# Patient Record
Sex: Female | Born: 1965 | Race: Black or African American | Hispanic: No | Marital: Married | Smoking: Never smoker
Health system: Southern US, Community
[De-identification: ages and names within clinical notes are randomized; demographics above are authoritative.]

## PROBLEM LIST (undated history)

## (undated) DIAGNOSIS — M5136 Other intervertebral disc degeneration, lumbar region: Secondary | ICD-10-CM

## (undated) DIAGNOSIS — M3500 Sicca syndrome, unspecified: Secondary | ICD-10-CM

## (undated) DIAGNOSIS — T7840XA Allergy, unspecified, initial encounter: Secondary | ICD-10-CM

## (undated) DIAGNOSIS — F32A Depression, unspecified: Secondary | ICD-10-CM

## (undated) DIAGNOSIS — M797 Fibromyalgia: Secondary | ICD-10-CM

## (undated) DIAGNOSIS — M51369 Other intervertebral disc degeneration, lumbar region without mention of lumbar back pain or lower extremity pain: Secondary | ICD-10-CM

## (undated) DIAGNOSIS — B029 Zoster without complications: Secondary | ICD-10-CM

## (undated) DIAGNOSIS — F419 Anxiety disorder, unspecified: Secondary | ICD-10-CM

## (undated) DIAGNOSIS — R7303 Prediabetes: Secondary | ICD-10-CM

## (undated) DIAGNOSIS — M1990 Unspecified osteoarthritis, unspecified site: Secondary | ICD-10-CM

## (undated) DIAGNOSIS — E119 Type 2 diabetes mellitus without complications: Secondary | ICD-10-CM

## (undated) DIAGNOSIS — Z9889 Other specified postprocedural states: Secondary | ICD-10-CM

## (undated) DIAGNOSIS — K219 Gastro-esophageal reflux disease without esophagitis: Secondary | ICD-10-CM

## (undated) DIAGNOSIS — D649 Anemia, unspecified: Secondary | ICD-10-CM

## (undated) DIAGNOSIS — F329 Major depressive disorder, single episode, unspecified: Secondary | ICD-10-CM

## (undated) DIAGNOSIS — R112 Nausea with vomiting, unspecified: Secondary | ICD-10-CM

## (undated) DIAGNOSIS — J189 Pneumonia, unspecified organism: Secondary | ICD-10-CM

## (undated) HISTORY — DX: Pneumonia, unspecified organism: J18.9

## (undated) HISTORY — DX: Allergy, unspecified, initial encounter: T78.40XA

## (undated) HISTORY — DX: Type 2 diabetes mellitus without complications: E11.9

## (undated) HISTORY — DX: Depression, unspecified: F32.A

## (undated) HISTORY — DX: Zoster without complications: B02.9

## (undated) HISTORY — DX: Major depressive disorder, single episode, unspecified: F32.9

## (undated) HISTORY — DX: Unspecified osteoarthritis, unspecified site: M19.90

## (undated) HISTORY — PX: TUBAL LIGATION: SHX77

---

## 1998-04-10 ENCOUNTER — Other Ambulatory Visit: Admission: RE | Admit: 1998-04-10 | Discharge: 1998-04-10 | Payer: Self-pay | Admitting: Obstetrics & Gynecology

## 1999-07-03 ENCOUNTER — Other Ambulatory Visit: Admission: RE | Admit: 1999-07-03 | Discharge: 1999-07-03 | Payer: Self-pay | Admitting: Obstetrics and Gynecology

## 2000-06-16 HISTORY — PX: DIAGNOSTIC LAPAROSCOPY: SUR761

## 2000-07-08 ENCOUNTER — Other Ambulatory Visit: Admission: RE | Admit: 2000-07-08 | Discharge: 2000-07-08 | Payer: Self-pay | Admitting: Obstetrics and Gynecology

## 2000-07-21 ENCOUNTER — Emergency Department (HOSPITAL_COMMUNITY): Admission: EM | Admit: 2000-07-21 | Discharge: 2000-07-21 | Payer: Self-pay | Admitting: Emergency Medicine

## 2000-07-21 ENCOUNTER — Encounter: Payer: Self-pay | Admitting: Emergency Medicine

## 2000-07-27 ENCOUNTER — Encounter: Payer: Self-pay | Admitting: Family Medicine

## 2000-07-27 ENCOUNTER — Ambulatory Visit (HOSPITAL_COMMUNITY): Admission: RE | Admit: 2000-07-27 | Discharge: 2000-07-27 | Payer: Self-pay | Admitting: Family Medicine

## 2000-09-18 ENCOUNTER — Ambulatory Visit (HOSPITAL_COMMUNITY): Admission: RE | Admit: 2000-09-18 | Discharge: 2000-09-18 | Payer: Self-pay | Admitting: Obstetrics and Gynecology

## 2001-08-13 ENCOUNTER — Other Ambulatory Visit: Admission: RE | Admit: 2001-08-13 | Discharge: 2001-08-13 | Payer: Self-pay | Admitting: Obstetrics and Gynecology

## 2002-12-16 ENCOUNTER — Emergency Department (HOSPITAL_COMMUNITY): Admission: EM | Admit: 2002-12-16 | Discharge: 2002-12-16 | Payer: Self-pay | Admitting: Emergency Medicine

## 2002-12-16 ENCOUNTER — Encounter: Payer: Self-pay | Admitting: Emergency Medicine

## 2003-04-17 ENCOUNTER — Other Ambulatory Visit: Admission: RE | Admit: 2003-04-17 | Discharge: 2003-04-17 | Payer: Self-pay | Admitting: Obstetrics and Gynecology

## 2005-06-16 HISTORY — PX: RIGHT OOPHORECTOMY: SHX2359

## 2005-06-26 ENCOUNTER — Encounter: Admission: RE | Admit: 2005-06-26 | Discharge: 2005-06-26 | Payer: Self-pay | Admitting: Family Medicine

## 2005-07-24 ENCOUNTER — Ambulatory Visit (HOSPITAL_COMMUNITY): Admission: RE | Admit: 2005-07-24 | Discharge: 2005-07-24 | Payer: Self-pay | Admitting: Obstetrics and Gynecology

## 2005-07-24 ENCOUNTER — Encounter (INDEPENDENT_AMBULATORY_CARE_PROVIDER_SITE_OTHER): Payer: Self-pay | Admitting: Specialist

## 2006-06-16 HISTORY — PX: ABDOMINAL HYSTERECTOMY: SHX81

## 2006-06-17 ENCOUNTER — Encounter: Admission: RE | Admit: 2006-06-17 | Discharge: 2006-06-17 | Payer: Self-pay | Admitting: Obstetrics and Gynecology

## 2006-07-25 ENCOUNTER — Inpatient Hospital Stay (HOSPITAL_COMMUNITY): Admission: AD | Admit: 2006-07-25 | Discharge: 2006-07-25 | Payer: Self-pay | Admitting: Obstetrics and Gynecology

## 2006-09-21 ENCOUNTER — Ambulatory Visit (HOSPITAL_COMMUNITY): Admission: RE | Admit: 2006-09-21 | Discharge: 2006-09-22 | Payer: Self-pay | Admitting: Obstetrics and Gynecology

## 2006-09-21 ENCOUNTER — Encounter (INDEPENDENT_AMBULATORY_CARE_PROVIDER_SITE_OTHER): Payer: Self-pay | Admitting: *Deleted

## 2007-04-09 ENCOUNTER — Encounter: Admission: RE | Admit: 2007-04-09 | Discharge: 2007-04-09 | Payer: Self-pay | Admitting: Family Medicine

## 2007-04-12 ENCOUNTER — Encounter: Admission: RE | Admit: 2007-04-12 | Discharge: 2007-04-12 | Payer: Self-pay | Admitting: Family Medicine

## 2007-06-28 ENCOUNTER — Encounter: Admission: RE | Admit: 2007-06-28 | Discharge: 2007-06-28 | Payer: Self-pay | Admitting: Obstetrics and Gynecology

## 2008-03-13 ENCOUNTER — Encounter: Admission: RE | Admit: 2008-03-13 | Discharge: 2008-03-13 | Payer: Self-pay | Admitting: Family Medicine

## 2008-06-27 ENCOUNTER — Encounter: Admission: RE | Admit: 2008-06-27 | Discharge: 2008-06-27 | Payer: Self-pay | Admitting: Family Medicine

## 2008-09-01 ENCOUNTER — Encounter: Admission: RE | Admit: 2008-09-01 | Discharge: 2008-09-01 | Payer: Self-pay | Admitting: Family Medicine

## 2008-11-21 ENCOUNTER — Encounter: Admission: RE | Admit: 2008-11-21 | Discharge: 2009-01-11 | Payer: Self-pay | Admitting: Family Medicine

## 2009-11-20 ENCOUNTER — Emergency Department (HOSPITAL_COMMUNITY): Admission: EM | Admit: 2009-11-20 | Discharge: 2009-11-20 | Payer: Self-pay | Admitting: Emergency Medicine

## 2010-03-19 ENCOUNTER — Encounter: Admission: RE | Admit: 2010-03-19 | Discharge: 2010-03-19 | Payer: Self-pay | Admitting: Obstetrics and Gynecology

## 2010-07-07 ENCOUNTER — Encounter: Payer: Self-pay | Admitting: Gastroenterology

## 2010-08-20 ENCOUNTER — Other Ambulatory Visit: Payer: Self-pay | Admitting: Sports Medicine

## 2010-08-20 DIAGNOSIS — M545 Low back pain, unspecified: Secondary | ICD-10-CM

## 2010-08-23 ENCOUNTER — Ambulatory Visit
Admission: RE | Admit: 2010-08-23 | Discharge: 2010-08-23 | Disposition: A | Discharge status: Home / Self Care | Payer: 59 | Source: Ambulatory Visit | Attending: Sports Medicine | Admitting: Sports Medicine

## 2010-08-23 DIAGNOSIS — M545 Low back pain, unspecified: Secondary | ICD-10-CM

## 2010-08-29 ENCOUNTER — Ambulatory Visit: Payer: 59 | Attending: Family Medicine

## 2010-08-29 DIAGNOSIS — M545 Low back pain, unspecified: Secondary | ICD-10-CM | POA: Insufficient documentation

## 2010-08-29 DIAGNOSIS — R5381 Other malaise: Secondary | ICD-10-CM | POA: Insufficient documentation

## 2010-08-29 DIAGNOSIS — M25659 Stiffness of unspecified hip, not elsewhere classified: Secondary | ICD-10-CM | POA: Insufficient documentation

## 2010-08-29 DIAGNOSIS — IMO0001 Reserved for inherently not codable concepts without codable children: Secondary | ICD-10-CM | POA: Insufficient documentation

## 2010-09-03 ENCOUNTER — Ambulatory Visit: Payer: 59

## 2010-09-10 ENCOUNTER — Ambulatory Visit: Payer: 59

## 2010-09-12 ENCOUNTER — Ambulatory Visit: Payer: 59

## 2010-09-17 ENCOUNTER — Ambulatory Visit: Payer: 59 | Attending: Family Medicine

## 2010-09-17 DIAGNOSIS — M545 Low back pain, unspecified: Secondary | ICD-10-CM | POA: Insufficient documentation

## 2010-09-17 DIAGNOSIS — R5381 Other malaise: Secondary | ICD-10-CM | POA: Insufficient documentation

## 2010-09-17 DIAGNOSIS — IMO0001 Reserved for inherently not codable concepts without codable children: Secondary | ICD-10-CM | POA: Insufficient documentation

## 2010-09-17 DIAGNOSIS — M25659 Stiffness of unspecified hip, not elsewhere classified: Secondary | ICD-10-CM | POA: Insufficient documentation

## 2010-09-19 ENCOUNTER — Ambulatory Visit: Payer: 59

## 2010-09-24 ENCOUNTER — Ambulatory Visit: Payer: 59 | Admitting: Physical Therapy

## 2010-09-26 ENCOUNTER — Ambulatory Visit: Payer: 59

## 2010-10-01 ENCOUNTER — Ambulatory Visit: Payer: 59 | Admitting: Physical Therapy

## 2010-11-01 NOTE — Op Note (Signed)
[name], [name]              ACCOUNT NO.:  [id number]   MEDICAL RECORD NO.:  [id number]          PATIENT TYPE:  AMB   LOCATION:  SDC                           FACILITY:  [facility]   PHYSICIAN:  Lenoard Aden, M.D.DATE OF BIRTH:  31-May-1966   DATE OF PROCEDURE:  09/21/2006  DATE OF DISCHARGE:                               OPERATIVE REPORT   PREOPERATIVE DIAGNOSIS:  Refractory dysmenorrhea and menorrhagia status  post endometrial ablation with poor resultant persistent pelvic pain.   POSTOPERATIVE DIAGNOSES:  1. Refractory dysmenorrhea and menorrhagia status post endometrial      ablation with poor resultant persistent pelvic pain.  2. Endometriosis.  3. Enterocele.   PROCEDURES:  1. Diagnostic laparoscopy.  2. Laparoscopically assisted vaginal hysterectomy.  3. Ablation of endometriosis.  4. McCall culdoplasty.   SURGEON:  Lenoard Aden, M.D.   ASSISTANT:  Genia Del, M.D.   ANESTHESIA:  General.   BLOOD LOSS:  200 mL.   COMPLICATIONS:  None.   DRAINS:  None.   COUNTS:  Correct.   COMPLICATIONS:  Uterine perforation.   Patient to recovery in good condition.   DESCRIPTION OF PROCEDURE:  After being apprised of the risks of  anesthesia, infection, bleeding, intra-abdominal injury with need for  repair, delayed versus immediate complications to include bowel and  bladder injury, the patient brought to the operating room where she was  administered general anesthetic without complications, prepped, draped  in sterile fashion.  Feet are placed in the elephant stirrups without  difficulty.  After achieving adequate anesthesia, the Rumi retractor is  placed per vagina in standard fashion.  Balloon is insufflated.  Infraumbilical incision made with a scalpel.  Veress needle placed.  Opening pressure of -2 noted, 4 liters CO2 insufflated without  difficulty and the trocars placed.  Due to previous endometrial  ablation, placement of the Rumi catheter was  difficult and upon entering  the cavity there was noted to be a fundal perforation with the Rumi  catheter through the anterior fundal portion of the uterus, clear  bladder and uterine vessels, no evidence of bleeding noted at this time.  There is endometriosis along the right uterosacral which is ablated and  endometriosis along the left ovary which is ablated.  Two 5 mm trocar  sites are placed suprapubically in the mid clavicular area and mid  axillary line atraumatically under direct visualization and subsequent  to their placement, endometriosis along the right round ligament is  ablated using the Gyrus device and also along the left ovary which  otherwise appears normal.  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  At this time, the  left and right round ligaments are cauterized and divided.  The tubo-  ovarian ligaments also divided on the left.  Bladder flap is developed  sharply.  Rumi cup is visualized. Uterine vessels are skeletonized  bilaterally and desiccated using the Gyrus device and divided.  The  spatula device is then used to circumscribe the Rumi cup along the  anterior portion, dislodging the specimen in  total after identifying the  ureters bilaterally at the commencement of the operation.  At this time,  good hemostasis noted.  Urine is clear.  Specimens removed per vagina  and vaginal cuff was inspected laparoscopically, cauterized and then  closed vaginally after placement of a McCall culdoplasty suture.  The  vagina was closed side-to-side in interrupted fashion for good closure.  Attention is then turned to laparoscopic portion of the procedure  whereby good hemostasis is noted.  There is additional coagulation  needed along the left uterine vein which is done without difficulty.  Urine remained clear, ureters bilaterally pulsatile peristalsing  normally bilaterally.  At this time, the CO2 is released and good   hemostasis is confirmed.  All instruments are removed under direct  visualization.  The patient tolerates procedure well, was awakened and  transferred to recovery in good condition.      Lenoard Aden, M.D.  Electronically Signed     RJT/MEDQ  D:  09/21/2006  T:  09/21/2006  Job:  [id number]

## 2010-11-01 NOTE — H&P (Signed)
NAMETICHINA, KOEBEL              ACCOUNT NO.:  [id number]   MEDICAL RECORD NO.:  [id number]          PATIENT TYPE:  AMB   LOCATION:  SDC                           FACILITY:  [facility]   PHYSICIAN:  Lenoard Aden, M.D.DATE OF BIRTH:  08/09/1965   DATE OF ADMISSION:  DATE OF DISCHARGE:                              HISTORY & PHYSICAL   CHIEF COMPLAINT:  1. Persistent dysmenorrhea and menorrhagia.  2. Status post laparoscopy.  3. Status post endometrial ablation with a history of endometriosis.   She is a 45 year old African-American female, G4, P2, with known  endometriosis and dysmenorrhea, who was recently within the past two  years treated with NovaSure endometrial ablation, with no improvement in  her dysmenorrhea, who presents for definitive therapy.  She is status  post tubal ligation.  She has a history of laparoscopic ablation of  endometriosis.  Previously, she has a history of spontaneous vaginal  delivery x2.   ALLERGIES:  PENICILLIN.   MEDICATIONS:  Dilaudid p.r.n. pain.   PHYSICAL EXAMINATION:  GENERAL:  She is 227 pounds with a blood pressure  of 112/74.  HEENT:  Normal.  LUNGS:  Clear.  HEART:  Regular rhythm.  ABDOMEN:  Soft, nontender.  PELVIC:  Exam reveals a normal-sized uterus and no adnexal masses.  She  is status post previous removal of her left ovary.  EXTREMITIES:  No clubbing.  NEUROLOGIC:  Nonfocal.   IMPRESSION:  Persistent refractory dysmenorrhea, status post ablation,  status post laparoscopy for definitive therapies, proceed with LAVH,  possible USO.  Risks of anesthesia, infection, bleeding, injury of  abdominal organs and need for repair was discussed __________  complications to include bowel and bladder injury, __________, inability  to cure pelvic pain.  Discussed with patient who acknowledges, will  proceed.      Lenoard Aden, M.D.  Electronically Signed     RJT/MEDQ  D:  09/20/2006  T:  09/20/2006  Job:  [id number]

## 2010-11-01 NOTE — Op Note (Signed)
[name], [name]              ACCOUNT NO.:  [id number]   MEDICAL RECORD NO.:  [id number]          PATIENT TYPE:  AMB   LOCATION:  SDC                           FACILITY:  [facility]   PHYSICIAN:  Lenoard Aden, M.D.DATE OF BIRTH:  [birth year]   DATE OF PROCEDURE:  07/24/2005  DATE OF DISCHARGE:                                 OPERATIVE REPORT   PREOPERATIVE DIAGNOSIS:  Right lower quadrant pain, menorrhagia,  dysmenorrhea.   POSTOPERATIVE DIAGNOSIS:  Right lower quadrant pain, menorrhagia,  dysmenorrhea.   PROCEDURE:  Diagnostic hysteroscopy, D&C, NovaSure endometrial ablation,  diagnostic laparoscopy, right salpingo-oophorectomy.   SURGEON:  Lenoard Aden, M.D.   ASSISTANT:  Chester Holstein. Earlene Plater, M.D.   ANESTHESIA:  General.   ESTIMATED BLOOD LOSS:  650 mL.   COMPLICATIONS:  None.   DRAINS:  Foley.   COUNTS:  Correct.   DISPOSITION:  The patient went to recovery room in good condition.   DESCRIPTION OF PROCEDURE:  After being apprised of the risks of anesthesia,  infection, bleeding, injury to abdominal organs with need for repair,  inability to cure pain, delayed versus immediate complications to include  bowel and bladder injury, the patient is brought to the operating room where  she is administered general anesthesia without complications.  Prepped and  draped in the usual sterile fashion.  Foley catheter placed.  After  achieving adequate anesthesia, dilute Pitressin solution placed at 3 and 9  o'clock cervicovaginal junction.  Measurements are taken for the NovaSure of  3 and 0.6 and 10 cm respectively.  Intracervical and uterine length.  At  this time, hysteroscope is placed.  No focal lesions are noted.  D&C is  performed.  Copious endometrial curettings are obtained.  At this time the  NovaSure device is placed in the standard fashion and CO2 test is negative.  The ablation is performed without difficulty.  Revisualization reveals  evidence of a successful  ablation and good coverage of the endometrial  cavity.  Hulka tenaculum was then placed per vagina and infraumbilical  incision was made with the scalpel.  The fascia was identified and opened.  Pursestring suture placed.  Peritoneum entered and sharply Hasson trocar  placed.  CO2 entered without difficulty, 3 liters.  Laparoscope placed.  Visualization reveals atraumatic trocar entry.  Normal liver and gallbladder  bed, normal appendiceal area.  The areas of right tube and ovary with two  small paratubal cysts, some focal endometriosis.  The infundibulopelvic  ligament is visualized after placement of two 5 mm trocars suprapubically in  the right and mid lower quadrants under transillumination.  Tripolar is  entered.  Infundibulopelvic ligament is identified, cauterized after  identifying the ureter on the right side.  Progressive bites down the  mesosalpingeal segment to the level of the tubo-ovarian ligament are taken,  removed, and excised using the gyrus tripolar device.  5 mm scope is placed.  Specimen is placed in the EndoCatch and removed through the umbilical port.  At this time, good hemostasis is obtained using the Kleppinger and the  tripolar device.  All instruments are removed under  direct visualization and  CO2 released.  Incisions closed using 0 Vicryl by tying the pursestring and  closing the subcutaneous tissue.  4-0 Vicryl placed, Dermabond placed.  Instruments removed from the vagina and Foley removed.  The patient  tolerated the procedure well and is transferred to the recovery room in good  condition.      Lenoard Aden, M.D.  Electronically Signed     RJT/MEDQ  D:  07/24/2005  T:  07/24/2005  Job:  [id number]

## 2010-11-01 NOTE — H&P (Signed)
[facility]  Patient:    [name], [name]                     MRN: [id number] Adm. Date:  [id number] Attending:  Lenoard Aden CC:         [facility]   History and Physical  CHIEF COMPLAINT:              Subacute right lower quadrant pain.  HISTORY OF PRESENT ILLNESS:   The patient is a 45 year old black female, gravida 4, para 2, status post tubal ligation two years ago, who presents with a persistent intermittent right lower quadrant pain.  Negative for GI or GU symptoms.  Pain is exacerbated by intercourse and is not worse with her period.  She has had no other gastrointestinal or genitourinary-type symptoms, and therefore, has had a negative workup to this point.  She has had a normal ultrasound.  She _______ for an ultrasound for diagnostic and hopefully therapeutic measures.  PAST MEDICAL HISTORY:         Remarkable for a family history of heart problems, diabetes, and hypercholesterolemia.  ALLERGIES:                    No known drug allergies.  PAST SURGICAL HISTORY:        Remarkable for a laparoscopic tubal ligation performed by Dr. Arlyce Dice without problems in 1999.  REVIEW OF SYSTEMS:            Gyn review of systems otherwise negative.  SOCIAL HISTORY:               Negative.  PHYSICAL EXAMINATION:  GENERAL:                      She is a well-developed and well-nourished black female in no apparent distress.  HEENT:                        Normal.  LUNGS:                        Clear.  HEART:                        Regular rhythm.  ABDOMEN:                      Soft, obese, and nontender.  PELVIC EXAMINATION:           Reveals a small uterus which is anteflexed and a small anterior uterine fibroid noted.  No adnexal masses are appreciated.  IMPRESSION:                   Persistent right lower quadrant pain of                               questionable etiology.  PLAN:                         To proceed with  diagnostic laparoscopy and possible RSO.  Risks of anesthesia, infection, bleeding, injury to the abdominal organs, and need for repair was discussed.  The possibility of inability to cure pain is noted and discussed in detail with the patient today.  Delayed versus immediate complications to include bowel and  bladder injury were discussed.  The patient acknowledges and desires to proceed. DD:  09/18/00 TD:  09/18/00 Job: [id number]

## 2010-11-01 NOTE — Op Note (Signed)
[facility]  Patient:    [name], [name]                     MRN: [id number] Proc. Date: 09/18/00 Adm. Date:  [id number] Attending:  Lenoard Aden CC:         [facility]   Operative Report  PREOPERATIVE DIAGNOSIS:       Intermittent right lower quadrant pain.  POSTOPERATIVE DIAGNOSIS:      Cul-de-sac adhesions.                               Right paraovarian adhesions.                               Right pericolonic adhesions.                               Endometriotic implant on right ovary.  OPERATION:                    Diagnostic laparoscopy.                               Extensive lysis of adhesions.                               Ablation of right ovarian endometriosis.  SURGEON:                      Lenoard Aden, M.D.  ANESTHESIA:                   General.  ESTIMATED BLOOD LOSS:         Less than 50 cc.  COMPLICATIONS:                None.  DRAINS/TUBES:                 None.  COUNTS:                       Correct.  DISPOSITION:                  Patient to recovery room in good condition.  DESCRIPTION OF PROCEDURE:     After being apprised of the risks of anesthesia, infection, bleeding, injury to abdominal organs and need for repair, inability to cure pain, consent was obtained and the patient was brought to the operating room where she was administered a general anesthetic without complications, placed in the dorsal lithotomy position and prepped and draped in the usual sterile fashion and catheterized until her bladder was emptied. Examination under anesthesia reveals a small anteflexed uterus and no adnexal masses.  A Hulka tenaculum was placed per vagina and an infraumbilical incision then made after placing a dilute Marcaine solution. The Veress needle was placed and opening pressures was 0 to -2 noted. The patients pressure was set to 25 and 4.5 L of CO2 was insufflated without difficulty. A trocar  was placed atraumatically and pictures taken with a normal liver and gallbladder bed noted. Extensive right pericecal and ascending colonic adhesions are noted. Normal sized uterus with small anterior fibroid, surgically divided  tubes, normal left ovary and some right ovarian adhesions and mid cul-de-sac adhesions are noted upon inspection. A mid suprapubic trocar site is made atraumatically with the scalpel and a 5 mm trocar placed. Sharp scissors and short grasper were used to stretch the pericolonic adhesions away from the anterior abdominal wall moving the bowel out of the operative field and these adhesions are dissected sharply using monopolar cautery until they are completely resected to the lateral abdominal sidewall. The periovarian adhesions are cauterized and divided and the hemorrhagic area that is on the caudad portion of the right ovary, questionably endometriotic versus hemorrhagic, is cauterized using bipolar cautery. The cul-de-sac adhesions are lysed using sharp dissection. Good hemostasis achieved. All areas of the surgical surfaces are inspected. A normal right ovary is apparent. The instruments were then removed under direct visualization. CO2 was released. The incisions were closed using #0 Vicryl and Dermabond. The instruments were then removed from the vagina. The patient tolerated the procedure well and was transferred to recovery room in good condition.  ADDENDUM:                     After establishing trocar entry, the patient pressure was reset to 15 mmHg and pneumoperitoneum was maintained. Also, before placement of the Veress needle, a hanging drop test was performed and negative pressure was confirmed. DD:  09/18/00 TD:  09/18/00 Job: [id number]

## 2011-04-09 ENCOUNTER — Other Ambulatory Visit: Payer: Self-pay | Admitting: Obstetrics and Gynecology

## 2011-04-09 DIAGNOSIS — Z1231 Encounter for screening mammogram for malignant neoplasm of breast: Secondary | ICD-10-CM

## 2011-04-28 ENCOUNTER — Other Ambulatory Visit: Payer: Self-pay | Admitting: Family Medicine

## 2011-04-28 DIAGNOSIS — E079 Disorder of thyroid, unspecified: Secondary | ICD-10-CM

## 2011-04-30 ENCOUNTER — Other Ambulatory Visit: Payer: 59

## 2011-05-01 ENCOUNTER — Ambulatory Visit
Admission: RE | Admit: 2011-05-01 | Discharge: 2011-05-01 | Disposition: A | Discharge status: Home / Self Care | Payer: 59 | Source: Ambulatory Visit | Attending: Family Medicine | Admitting: Family Medicine

## 2011-05-01 DIAGNOSIS — E079 Disorder of thyroid, unspecified: Secondary | ICD-10-CM

## 2011-05-12 ENCOUNTER — Ambulatory Visit: Payer: 59

## 2011-05-22 ENCOUNTER — Ambulatory Visit: Payer: 59

## 2011-06-06 ENCOUNTER — Ambulatory Visit: Payer: 59

## 2011-07-17 ENCOUNTER — Other Ambulatory Visit (HOSPITAL_COMMUNITY)
Admission: RE | Admit: 2011-07-17 | Discharge: 2011-07-17 | Disposition: A | Discharge status: Home / Self Care | Payer: 59 | Source: Ambulatory Visit | Attending: Obstetrics and Gynecology | Admitting: Obstetrics and Gynecology

## 2011-07-17 ENCOUNTER — Other Ambulatory Visit: Payer: Self-pay | Admitting: Obstetrics and Gynecology

## 2011-07-17 DIAGNOSIS — Z01419 Encounter for gynecological examination (general) (routine) without abnormal findings: Secondary | ICD-10-CM | POA: Insufficient documentation

## 2012-02-27 ENCOUNTER — Encounter (HOSPITAL_COMMUNITY): Payer: Self-pay | Admitting: Emergency Medicine

## 2012-02-27 ENCOUNTER — Emergency Department (HOSPITAL_COMMUNITY): Payer: 59

## 2012-02-27 ENCOUNTER — Emergency Department (HOSPITAL_COMMUNITY)
Admission: EM | Admit: 2012-02-27 | Discharge: 2012-02-28 | Disposition: A | Discharge status: Home / Self Care | Payer: 59 | Attending: Emergency Medicine | Admitting: Emergency Medicine

## 2012-02-27 DIAGNOSIS — S63509A Unspecified sprain of unspecified wrist, initial encounter: Secondary | ICD-10-CM | POA: Insufficient documentation

## 2012-02-27 DIAGNOSIS — S134XXA Sprain of ligaments of cervical spine, initial encounter: Secondary | ICD-10-CM

## 2012-02-27 DIAGNOSIS — S139XXA Sprain of joints and ligaments of unspecified parts of neck, initial encounter: Secondary | ICD-10-CM | POA: Insufficient documentation

## 2012-02-27 DIAGNOSIS — M545 Low back pain, unspecified: Secondary | ICD-10-CM | POA: Insufficient documentation

## 2012-02-27 DIAGNOSIS — T148XXA Other injury of unspecified body region, initial encounter: Secondary | ICD-10-CM

## 2012-02-27 DIAGNOSIS — S63502A Unspecified sprain of left wrist, initial encounter: Secondary | ICD-10-CM

## 2012-02-27 DIAGNOSIS — M542 Cervicalgia: Secondary | ICD-10-CM | POA: Insufficient documentation

## 2012-02-27 DIAGNOSIS — M25559 Pain in unspecified hip: Secondary | ICD-10-CM | POA: Insufficient documentation

## 2012-02-27 DIAGNOSIS — IMO0002 Reserved for concepts with insufficient information to code with codable children: Secondary | ICD-10-CM | POA: Insufficient documentation

## 2012-02-27 DIAGNOSIS — R51 Headache: Secondary | ICD-10-CM | POA: Insufficient documentation

## 2012-02-27 MED ORDER — HYDROCODONE-ACETAMINOPHEN 5-325 MG PO TABS
2.0000 | ORAL_TABLET | Freq: Once | ORAL | Status: AC
  Administered 2012-02-27: 2 via ORAL
  Filled 2012-02-27: qty 2

## 2012-02-27 MED ORDER — HYDROCODONE-ACETAMINOPHEN 5-500 MG PO TABS: 1.0000 | ORAL_TABLET | Freq: Four times a day (QID) | ORAL | Status: AC | PRN

## 2012-02-27 MED ORDER — CYCLOBENZAPRINE HCL 10 MG PO TABS: 10.0000 mg | ORAL_TABLET | Freq: Two times a day (BID) | ORAL | Status: AC | PRN

## 2012-02-27 NOTE — ED Notes (Signed)
[employer] at bedside 
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
## 2012-03-03 ENCOUNTER — Ambulatory Visit
Admission: RE | Admit: 2012-03-03 | Discharge: 2012-03-03 | Disposition: A | Discharge status: Home / Self Care | Payer: 59 | Source: Ambulatory Visit | Attending: Family Medicine | Admitting: Family Medicine

## 2012-03-03 ENCOUNTER — Other Ambulatory Visit: Payer: Self-pay | Admitting: Family Medicine

## 2012-03-03 DIAGNOSIS — K047 Periapical abscess without sinus: Secondary | ICD-10-CM

## 2012-03-03 MED ORDER — GADOBENATE DIMEGLUMINE 529 MG/ML IV SOLN
20.0000 mL | Freq: Once | INTRAVENOUS | Status: AC | PRN
  Administered 2012-03-03: 20 mL via INTRAVENOUS

## 2012-03-04 ENCOUNTER — Other Ambulatory Visit: Payer: 59

## 2012-03-07 ENCOUNTER — Other Ambulatory Visit: Payer: 59

## 2012-07-24 ENCOUNTER — Emergency Department (HOSPITAL_COMMUNITY)
Admission: EM | Admit: 2012-07-24 | Discharge: 2012-07-24 | Disposition: A | Discharge status: Home / Self Care | Payer: Self-pay | Attending: Emergency Medicine | Admitting: Emergency Medicine

## 2012-07-24 ENCOUNTER — Emergency Department (HOSPITAL_COMMUNITY): Payer: Self-pay

## 2012-07-24 ENCOUNTER — Encounter (HOSPITAL_COMMUNITY): Payer: Self-pay | Admitting: Emergency Medicine

## 2012-07-24 DIAGNOSIS — IMO0002 Reserved for concepts with insufficient information to code with codable children: Secondary | ICD-10-CM | POA: Insufficient documentation

## 2012-07-24 DIAGNOSIS — Y929 Unspecified place or not applicable: Secondary | ICD-10-CM | POA: Insufficient documentation

## 2012-07-24 DIAGNOSIS — S62609A Fracture of unspecified phalanx of unspecified finger, initial encounter for closed fracture: Secondary | ICD-10-CM

## 2012-07-24 DIAGNOSIS — Y9389 Activity, other specified: Secondary | ICD-10-CM | POA: Insufficient documentation

## 2012-07-24 DIAGNOSIS — R296 Repeated falls: Secondary | ICD-10-CM | POA: Insufficient documentation

## 2012-07-24 DIAGNOSIS — Z8739 Personal history of other diseases of the musculoskeletal system and connective tissue: Secondary | ICD-10-CM | POA: Insufficient documentation

## 2012-07-24 DIAGNOSIS — Z79899 Other long term (current) drug therapy: Secondary | ICD-10-CM | POA: Insufficient documentation

## 2012-07-24 HISTORY — DX: Other intervertebral disc degeneration, lumbar region without mention of lumbar back pain or lower extremity pain: M51.369

## 2012-07-24 HISTORY — DX: Other intervertebral disc degeneration, lumbar region: M51.36

## 2012-07-24 MED ORDER — IBUPROFEN 600 MG PO TABS: 600.0000 mg | ORAL_TABLET | Freq: Four times a day (QID) | ORAL | Status: DC | PRN

## 2012-07-24 MED ORDER — HYDROCODONE-ACETAMINOPHEN 5-325 MG PO TABS: ORAL_TABLET | ORAL | Status: DC

## 2012-07-24 NOTE — ED Notes (Signed)
Pt c/o right 5th finger pain and swelling after falling.

## 2012-07-24 NOTE — ED Provider Notes (Signed)
History     CSN: [id number]  Arrival date & time 07/24/12  0801   First MD Initiated Contact with Patient 07/24/12 [phone]      Chief Complaint  Patient presents with  . Finger Injury    (Consider location/radiation/quality/duration/timing/severity/associated sxs/prior treatment) HPI Comments: Patient presents with complaint of right hand pain which started acutely 4 days ago when she fell. Patient states that she fell onto an outstretched hand and her fifth finger hyperextended. Patient states that she noted pain and swelling initially which has not improved. She buddy taped her fingers at home. She's been taking Tylenol and ibuprofen for pain. She denies wrist, elbow, or shoulder pain. She did not hit her head. Course is constant. Movement or palpation makes the pain worse. Nothing makes it better.  The history is provided by the patient.    Past Medical History  Diagnosis Date  . DDD (degenerative disc disease), lumbar     Past Surgical History  Procedure Laterality Date  . Abdominal hysterectomy    . Tubal ligation    . Oophorectomy      History reviewed. No pertinent family history.  History  Substance Use Topics  . Smoking status: Never Smoker   . Smokeless tobacco: Not on file  . Alcohol Use: Yes     Comment: social    OB History   Grav Para Term Preterm Abortions TAB SAB Ect Mult Living                  Review of Systems  Constitutional: Negative for activity change.  HENT: Negative for neck pain.   Musculoskeletal: Positive for joint swelling and arthralgias. Negative for back pain.  Skin: Negative for wound.  Neurological: Negative for weakness and numbness.    Allergies  Penicillins  Home Medications   Current Outpatient Rx  Name  Route  Sig  Dispense  Refill  . FLUoxetine (PROZAC) 20 MG tablet   Oral   Take 20 mg by mouth daily.         . fluticasone (FLONASE) 50 MCG/ACT nasal spray   Nasal   Place 2 sprays into the nose daily.         [name] ibuprofen (ADVIL,MOTRIN) 800 MG tablet   Oral   Take 800 mg by mouth every 8 (eight) hours as needed.           BP 132/96  Pulse 77  Temp(Src) 97.7 F (36.5 C) (Oral)  Resp 16  Ht 5' 5.5" (1.664 m)  Wt 210 lb (95.255 kg)  BMI 34.4 kg/m2  SpO2 96%  Physical Exam  Nursing note and vitals reviewed. Constitutional: She appears well-developed and well-nourished.  HENT:  Head: Normocephalic and atraumatic.  Eyes: Pupils are equal, round, and reactive to light.  Neck: Normal range of motion. Neck supple.  Cardiovascular: Exam reveals no decreased pulses.   Musculoskeletal: She exhibits tenderness. She exhibits no edema.       Right shoulder: Normal.       Right elbow: Normal.      Right wrist: Normal.       Cervical back: Normal.       Right hand: She exhibits decreased range of motion, tenderness, bony tenderness and swelling. She exhibits normal capillary refill. Normal sensation noted. Normal strength noted.       Hands: Neurological: She is alert. No sensory deficit.  Motor, sensation, and vascular distal to the injury is fully intact.   Skin: Skin is warm and dry.  Psychiatric: She has a normal mood and affect.    ED Course  Procedures (including critical care time)  Labs Reviewed - No data to display Dg Hand Complete Right  07/24/2012  *RADIOLOGY REPORT*  Clinical Data: 47 year old female with pain lateral hand radiating to wrist.  Status post injury.  RIGHT HAND - COMPLETE 3+ VIEW  Comparison: None.  Findings: Transverse fracture through the proximal metadiaphysis of the right fifth proximal phalanx.  Minimal dorsal and ulnar angulation.  Fifth metacarpal intact.  Distal radius and ulna intact.  Carpal bone alignment within normal limits.  No other acute fracture.  IMPRESSION: Transverse extraarticular fracture through the base of the right fifth proximal phalanx with minor angulation.   Original Report Authenticated By: Erskine Speed, M.D.      1. Finger fracture,  right     8:38 AM Patient seen and examined. Work-up initiated.    Vital signs reviewed and are as follows: Filed Vitals:   07/24/12 0815  BP: 132/96  Pulse: 77  Temp: 97.7 F (36.5 C)  Resp: 16   X-ray reviewed by myself. Patient informed. Finger splint placed by nurse.  Orthopedic referral given.  Patient counseled on use of narcotic pain medications. Counseled not to combine these medications with others containing tylenol. Urged not to drink alcohol, drive, or perform any other activities that requires focus while taking these medications. The patient verbalizes understanding and agrees with the plan.     MDM  Finger fracture, immobilized in the ED. Fracture does not involve the joint. Finger is neurovascularly intact. Orthopedic followup given.       [place], [place] 07/24/12 [phone]

## 2012-07-24 NOTE — ED Notes (Signed)
PA at bedside.

## 2012-07-24 NOTE — ED Provider Notes (Signed)
Medical screening examination/treatment/procedure(s) were performed by non-physician practitioner and as supervising physician I was immediately available for consultation/collaboration.  Doug Sou, MD 07/24/12 1536

## 2013-05-25 ENCOUNTER — Other Ambulatory Visit (HOSPITAL_COMMUNITY)
Admission: RE | Admit: 2013-05-25 | Discharge: 2013-05-25 | Disposition: A | Discharge status: Home / Self Care | Payer: 59 | Source: Ambulatory Visit | Attending: Obstetrics and Gynecology | Admitting: Obstetrics and Gynecology

## 2013-05-25 ENCOUNTER — Other Ambulatory Visit: Payer: Self-pay | Admitting: Obstetrics and Gynecology

## 2013-05-25 DIAGNOSIS — Z01419 Encounter for gynecological examination (general) (routine) without abnormal findings: Secondary | ICD-10-CM | POA: Insufficient documentation

## 2013-05-25 DIAGNOSIS — Z1151 Encounter for screening for human papillomavirus (HPV): Secondary | ICD-10-CM | POA: Insufficient documentation

## 2014-07-18 ENCOUNTER — Other Ambulatory Visit: Payer: Self-pay

## 2014-07-18 DIAGNOSIS — Z1231 Encounter for screening mammogram for malignant neoplasm of breast: Secondary | ICD-10-CM

## 2014-08-03 ENCOUNTER — Encounter: Payer: Self-pay | Admitting: Internal Medicine

## 2014-08-03 ENCOUNTER — Ambulatory Visit (INDEPENDENT_AMBULATORY_CARE_PROVIDER_SITE_OTHER): Payer: 59 | Admitting: Internal Medicine

## 2014-08-03 VITALS — BP 120/80 | HR 78 | Temp 98.2°F | Ht 65.5 in | Wt 258.0 lb

## 2014-08-03 DIAGNOSIS — F419 Anxiety disorder, unspecified: Secondary | ICD-10-CM | POA: Insufficient documentation

## 2014-08-03 DIAGNOSIS — Z13220 Encounter for screening for lipoid disorders: Secondary | ICD-10-CM

## 2014-08-03 DIAGNOSIS — R5383 Other fatigue: Secondary | ICD-10-CM

## 2014-08-03 DIAGNOSIS — M62830 Muscle spasm of back: Secondary | ICD-10-CM

## 2014-08-03 DIAGNOSIS — F329 Major depressive disorder, single episode, unspecified: Secondary | ICD-10-CM

## 2014-08-03 DIAGNOSIS — F32A Depression, unspecified: Secondary | ICD-10-CM | POA: Insufficient documentation

## 2014-08-03 DIAGNOSIS — M5136 Other intervertebral disc degeneration, lumbar region: Secondary | ICD-10-CM | POA: Insufficient documentation

## 2014-08-03 MED ORDER — TRAMADOL HCL 50 MG PO TABS: 50.0000 mg | ORAL_TABLET | Freq: Four times a day (QID) | ORAL | Status: DC | PRN

## 2014-08-03 MED ORDER — METHOCARBAMOL 500 MG PO TABS: 500.0000 mg | ORAL_TABLET | Freq: Three times a day (TID) | ORAL | Status: DC | PRN

## 2014-08-03 NOTE — Patient Instructions (Signed)

## 2014-08-03 NOTE — Assessment & Plan Note (Signed)
Moderate but stable I will fill her Tramadol and Norco but not monthly Tramadol refilled today Xray and MRI reviewed

## 2014-08-03 NOTE — Progress Notes (Signed)
HPI  Pt presents to the clinic today to establish care and for management of the conditions listed below. She is transferring care from [facility] and [place].  Flu: 2014 Tetanus: < 10 years ago Pap Smear: She thinks in 2013. Had hysterectomy. Mammogram: 2013, scheduled for 2/232016 Vision Screening: Scheduled 08/2014 Dentist: an needed  She c/o back spasms in the left shoulder area. She noticed this 2 months ago but it is intermittent. When it occurs, it feels like a sharp pain, then achy afterwards. She has tried Ibuprofen with some relief. She denies any injury to the area .   Depression and Anxiety: She takes Prozac daily, which seems to work well for her. She also takes Xanax daily at night to help her sleep when she is very stressed and has trouble sleeping. She reports she has been more stressed lately over bills.  DDD of Lumbar back: Moderate. She has had xrays and MRI of lumbar spine. Results have been reviewed in Epic. She reports she takes advil, ultram and norco depending on the severity of the pain but does not need to take medication on a daily basis. She has also been on Robaxin in the past. She was seeing pain management but reports her insurance would not cover those visits.   Past Medical History  Diagnosis Date  . DDD (degenerative disc disease), lumbar   . Depression   . Allergy      Current Outpatient Prescriptions  Medication Sig Dispense Refill  . doxycycline (VIBRA-TABS) 100 MG tablet     . FLUoxetine (PROZAC) 20 MG tablet Take 20 mg by mouth daily before breakfast.     . HYDROcodone-acetaminophen (NORCO/VICODIN) 5-325 MG per tablet Take 1-2 tablets every 6 hours as needed for severe pain 12 tablet 0  . ibuprofen (ADVIL,MOTRIN) 800 MG tablet     . predniSONE (DELTASONE) 10 MG tablet     . PROAIR HFA 108 (90 BASE) MCG/ACT inhaler     . traMADol (ULTRAM) 50 MG tablet      No current facility-administered medications for this visit.    Allergies   Allergen Reactions  . Penicillins Other (See Comments)    Childhood allergy    No family history on file.  History   Social History  . Marital Status: Married    Spouse Name: N/A  . Number of Children: N/A  . Years of Education: N/A   Occupational History  . Not on file.   Social History Main Topics  . Smoking status: Never Smoker   . Smokeless tobacco: Not on file  . Alcohol Use: Yes     Comment: social  . Drug Use: No  . Sexual Activity: Not on file   Other Topics Concern  . Not on file   Social History Narrative    ROS:  Constitutional: Pt reports fatigue.  Denies fever, malaise, headache or abrupt weight changes.  Respiratory: Denies difficulty breathing, shortness of breath, cough or sputum production.   Cardiovascular: Denies chest pain, chest tightness, palpitations or swelling in the hands or feet.  Musculoskeletal: Pt reports back pain. Denies decrease in range of motion, difficulty with gait, muscle pain or joint swelling.  Skin: Denies redness, rashes, lesions or ulcercations.  Neurological: Denies dizziness, difficulty with memory, difficulty with speech or problems with balance and coordination.  Psych: Pt reports depression. Denies anxiety, SI/HI.  No other specific complaints in a complete review of systems (except as listed in HPI above).  PE:  Ht 5' 5.5" (  1.664 m)  Wt 258 lb (117.028 kg)  BMI 42.27 kg/m2 Wt Readings from Last 3 Encounters:  08/03/14 258 lb (117.028 kg)  07/24/12 210 lb (95.255 kg)    General: Appears her stated age, well developed, well nourished in NAD. Skin: Warm, dry and intact. Cardiovascular: Normal rate and rhythm. S1,S2 noted.  No murmur, rubs or gallops noted.  Pulmonary/Chest: Normal effort and positive vesicular breath sounds. No respiratory distress. No wheezes, rales or ronchi noted.  Musculoskeletal: Decreased flexion and extension of the spine. Pine with palpation of the lumbar spine. Strength 5/5 BLE. No  difficulty with gait. Neurological: Alert and oriented.  Coordination normal. +DTRs bilaterally. Psychiatric: Mood normal but affect flat. Behavior is normal. Judgment and thought content normal.     Assessment and Plan:  Screening for HLD:  Will check lipid profile today  Fatigue:  Will check TSH and Vit D  Muscle Spasms of left upper back:  eRx for Robaxin Stretching exercises given A heating pad may be helpful  RTC in 6 months or sooner if needed

## 2014-08-03 NOTE — Assessment & Plan Note (Signed)
Support offered today Continue Prozac at this time Check CBC and CMET today

## 2014-08-03 NOTE — Progress Notes (Signed)
Pre visit review using our clinic review tool, if applicable. No additional management support is needed unless otherwise documented below in the visit note. 

## 2014-08-04 ENCOUNTER — Other Ambulatory Visit: Payer: Self-pay

## 2014-08-04 LAB — CBC WITH DIFFERENTIAL/PLATELET
Basophils Absolute: 0 10*3/uL (ref 0.0–0.2)
Basos: 1 %
Eos: 1 %
Eosinophils Absolute: 0.1 10*3/uL (ref 0.0–0.4)
HCT: 37.8 % (ref 34.0–46.6)
Hemoglobin: 13 g/dL (ref 11.1–15.9)
Immature Grans (Abs): 0 10*3/uL (ref 0.0–0.1)
Immature Granulocytes: 0 %
Lymphocytes Absolute: 2.8 10*3/uL (ref 0.7–3.1)
Lymphs: 48 %
MCH: 32.3 pg (ref 26.6–33.0)
MCHC: 34.4 g/dL (ref 31.5–35.7)
MCV: 94 fL (ref 79–97)
Monocytes Absolute: 0.5 10*3/uL (ref 0.1–0.9)
Monocytes: 9 %
Neutrophils Absolute: 2.4 10*3/uL (ref 1.4–7.0)
Neutrophils Relative %: 41 %
Platelets: 238 10*3/uL (ref 150–379)
RBC: 4.02 x10E6/uL (ref 3.77–5.28)
RDW: 13.7 % (ref 12.3–15.4)
WBC: 5.8 10*3/uL (ref 3.4–10.8)

## 2014-08-04 LAB — COMPREHENSIVE METABOLIC PANEL
ALT: 12 IU/L (ref 0–32)
AST: 12 IU/L (ref 0–40)
Albumin/Globulin Ratio: 1.3 (ref 1.1–2.5)
Albumin: 4.3 g/dL (ref 3.5–5.5)
Alkaline Phosphatase: 64 IU/L (ref 39–117)
BUN/Creatinine Ratio: 14 (ref 9–23)
BUN: 10 mg/dL (ref 6–24)
Bilirubin Total: 0.3 mg/dL (ref 0.0–1.2)
CO2: 22 mmol/L (ref 18–29)
Calcium: 9.6 mg/dL (ref 8.7–10.2)
Chloride: 104 mmol/L (ref 97–108)
Creatinine, Ser: 0.71 mg/dL (ref 0.57–1.00)
GFR calc Af Amer: 116 mL/min/{1.73_m2} (ref >59)
GFR calc non Af Amer: 101 mL/min/{1.73_m2} (ref >59)
Globulin, Total: 3.3 g/dL (ref 1.5–4.5)
Glucose: 96 mg/dL (ref 65–99)
Potassium: 4.1 mmol/L (ref 3.5–5.2)
Sodium: 141 mmol/L (ref 134–144)
Total Protein: 7.6 g/dL (ref 6.0–8.5)

## 2014-08-04 LAB — VITAMIN D 25 HYDROXY (VIT D DEFICIENCY, FRACTURES): Vit D, 25-Hydroxy: 17 ng/mL — ABNORMAL LOW (ref 30.0–100.0)

## 2014-08-04 LAB — LIPID PANEL
Chol/HDL Ratio: 3.6 ratio units (ref 0.0–4.4)
Cholesterol, Total: 204 mg/dL — ABNORMAL HIGH (ref 100–199)
HDL: 56 mg/dL (ref >39)
LDL Calculated: 128 mg/dL — ABNORMAL HIGH (ref 0–99)
Triglycerides: 102 mg/dL (ref 0–149)
VLDL Cholesterol Cal: 20 mg/dL (ref 5–40)

## 2014-08-04 LAB — TSH: TSH: 2.3 u[IU]/mL (ref 0.450–4.500)

## 2014-08-04 MED ORDER — METHOCARBAMOL 500 MG PO TABS: 500.0000 mg | ORAL_TABLET | Freq: Three times a day (TID) | ORAL | Status: DC | PRN

## 2014-08-04 MED ORDER — VITAMIN D (ERGOCALCIFEROL) 1.25 MG (50000 UNIT) PO CAPS: 50000.0000 [IU] | ORAL_CAPSULE | ORAL | Status: DC

## 2014-08-04 NOTE — Addendum Note (Signed)
Addended by: Lurlean Nanny on: 08/04/2014 04:31 PM   Modules accepted: Orders

## 2014-08-04 NOTE — Telephone Encounter (Signed)
Sent to [place] originally by accident--resent to [facility] [address]

## 2014-08-08 ENCOUNTER — Ambulatory Visit
Admission: RE | Admit: 2014-08-08 | Discharge: 2014-08-08 | Disposition: A | Discharge status: Home / Self Care | Payer: 59 | Source: Ambulatory Visit

## 2014-08-08 DIAGNOSIS — Z1231 Encounter for screening mammogram for malignant neoplasm of breast: Secondary | ICD-10-CM

## 2014-08-09 ENCOUNTER — Other Ambulatory Visit: Payer: Self-pay | Admitting: Internal Medicine

## 2014-08-09 DIAGNOSIS — R928 Other abnormal and inconclusive findings on diagnostic imaging of breast: Secondary | ICD-10-CM

## 2014-08-14 ENCOUNTER — Telehealth: Payer: Self-pay | Admitting: *Deleted

## 2014-08-14 ENCOUNTER — Other Ambulatory Visit: Payer: Self-pay

## 2014-08-14 MED ORDER — HYDROCODONE-ACETAMINOPHEN 5-325 MG PO TABS: 1.0000 | ORAL_TABLET | Freq: Four times a day (QID) | ORAL | Status: DC | PRN

## 2014-08-14 NOTE — Telephone Encounter (Signed)
Patient left a voicemail stating that she is waiting to hear back regarding her lab results and wants to know when she can get the prescription for her Hydrocodone?

## 2014-08-14 NOTE — Telephone Encounter (Signed)
Mel- can you address her lab results, you may have sent her a letter. Advise her I will refill her hydrocodone but this is for severe pain only and will not be given on a monthly basis- print out RX for me to sign

## 2014-08-14 NOTE — Telephone Encounter (Signed)
Spoke with pt and she states she did not have any questions or concerns regarding her labs, but she did want to see if Rx for Norco could be filled as [facility] said she would wait until after lab results come back--per verbal order from [facility] printed and placed in the front office for pick up and pt is aware--controlled substance contract needed

## 2014-08-16 ENCOUNTER — Encounter: Payer: Self-pay | Admitting: *Deleted

## 2014-08-17 ENCOUNTER — Other Ambulatory Visit: Payer: 59

## 2014-08-21 ENCOUNTER — Ambulatory Visit
Admission: RE | Admit: 2014-08-21 | Discharge: 2014-08-21 | Disposition: A | Discharge status: Home / Self Care | Payer: 59 | Source: Ambulatory Visit | Attending: Internal Medicine | Admitting: Internal Medicine

## 2014-08-21 ENCOUNTER — Other Ambulatory Visit: Payer: Self-pay | Admitting: Internal Medicine

## 2014-08-21 ENCOUNTER — Encounter: Payer: 59 | Admitting: Internal Medicine

## 2014-08-21 DIAGNOSIS — R928 Other abnormal and inconclusive findings on diagnostic imaging of breast: Secondary | ICD-10-CM

## 2014-08-23 ENCOUNTER — Other Ambulatory Visit: Payer: Self-pay

## 2014-08-23 MED ORDER — IBUPROFEN 800 MG PO TABS: 800.0000 mg | ORAL_TABLET | Freq: Three times a day (TID) | ORAL | Status: DC

## 2014-08-23 NOTE — Telephone Encounter (Signed)
Pt requesting 90 day supply sent to mail order--also requested Tramadol--will let pt know there will not be 90 day on controlled substances--please advise if this is okay

## 2014-08-24 MED ORDER — IBUPROFEN 800 MG PO TABS: 800.0000 mg | ORAL_TABLET | Freq: Three times a day (TID) | ORAL | Status: DC

## 2014-08-24 NOTE — Addendum Note (Signed)
Addended by: Lurlean Nanny on: 08/24/2014 04:17 PM   Modules accepted: Orders

## 2014-08-30 MED ORDER — ALPRAZOLAM 0.5 MG PO TABS: 0.5000 mg | ORAL_TABLET | Freq: Two times a day (BID) | ORAL | Status: DC | PRN

## 2014-08-30 NOTE — Addendum Note (Signed)
Addended by: Lurlean Nanny on: 08/30/2014 11:39 AM   Modules accepted: Orders

## 2014-08-30 NOTE — Telephone Encounter (Signed)
Pt is aware no controlled substances will be filled through mail order. Pt states she is down to her last 2 pills of Xanax and she recalls you saying that you wanted to wait on "something" before she continues taking Xanax on a PRN Basis--pt states she may take 1/2-1 tablet 2-3 times weekly---she did not know if you said you wanted try something else first--pt will need refill if you are okay with her continuing to take it on an PRN basis--please advise

## 2014-08-30 NOTE — Telephone Encounter (Signed)
Rx called in to pharmacy. 

## 2014-08-30 NOTE — Telephone Encounter (Signed)
Is she still taking the Prozac? If so, ok to be taking xanax 2-3 times per week and ok to refill

## 2014-09-04 ENCOUNTER — Encounter: Payer: Self-pay | Admitting: Internal Medicine

## 2014-09-11 ENCOUNTER — Other Ambulatory Visit: Payer: Self-pay | Admitting: Internal Medicine

## 2014-09-11 NOTE — Telephone Encounter (Signed)
Electronic refill request. Last Filled:    30 tablet 0 RF on 08/03/2014  Last office visit:   08/03/14  Please advise.

## 2014-09-28 ENCOUNTER — Encounter: Payer: Self-pay | Admitting: Internal Medicine

## 2014-09-28 ENCOUNTER — Ambulatory Visit (INDEPENDENT_AMBULATORY_CARE_PROVIDER_SITE_OTHER): Payer: 59 | Admitting: Internal Medicine

## 2014-09-28 VITALS — BP 120/82 | HR 85 | Temp 98.5°F | Ht 65.5 in | Wt 256.0 lb

## 2014-09-28 DIAGNOSIS — M5136 Other intervertebral disc degeneration, lumbar region: Secondary | ICD-10-CM

## 2014-09-28 DIAGNOSIS — Z0000 Encounter for general adult medical examination without abnormal findings: Secondary | ICD-10-CM

## 2014-09-28 MED ORDER — HYDROCODONE-ACETAMINOPHEN 5-325 MG PO TABS: 1.0000 | ORAL_TABLET | Freq: Four times a day (QID) | ORAL | Status: DC | PRN

## 2014-09-28 MED ORDER — IBUPROFEN 800 MG PO TABS: 800.0000 mg | ORAL_TABLET | Freq: Three times a day (TID) | ORAL | Status: DC

## 2014-09-28 NOTE — Assessment & Plan Note (Signed)
Currently flare Continue Tramadol and Ibuprofen, not at the same time Ibuprofen refilled Norco refilled Referral to pain management placed

## 2014-09-28 NOTE — Progress Notes (Signed)
Subjective:    Patient ID: [name], female    DOB: 1965/06/30, 49 y.o.   MRN: [id number]  HPI  Pt presents to the clinic today for her annual exam.  Flu: 2014 Tetanus: < 10 years ago LMP: hysterectomy Pap Smear: She thinks in 2013, hysterectomy Mammogram: 08/21/14 Vision Screening: 08/2014 Dentist: as needed   She continues to have low back pain. This is chronic for her. She reports her back flared up this week and she has not been able to work at all this week. She has been taking the Tramadol and Ibuprofen but reports she is out of the Norco. She reports that she can not afford to go to pain management because she has to pay out of pocket. She would like to know if I could refill her Norco.  Review of Systems      Past Medical History  Diagnosis Date  . DDD (degenerative disc disease), lumbar   . Depression   . Allergy     Current Outpatient Prescriptions  Medication Sig Dispense Refill  . ALPRAZolam (XANAX) 0.5 MG tablet Take 1 tablet (0.5 mg total) by mouth 2 (two) times daily as needed for anxiety. 30 tablet 0  . FLUoxetine (PROZAC) 20 MG tablet Take 40 mg by mouth daily before breakfast.     . HYDROcodone-acetaminophen (NORCO/VICODIN) 5-325 MG per tablet Take 1-2 tablets by mouth every 6 (six) hours as needed for moderate pain. 30 tablet 0  . ibuprofen (ADVIL,MOTRIN) 800 MG tablet Take 1 tablet (800 mg total) by mouth 3 (three) times daily. 90 tablet 0  . methocarbamol (ROBAXIN) 500 MG tablet Take 1 tablet (500 mg total) by mouth every 8 (eight) hours as needed for muscle spasms. 30 tablet 0  . traMADol (ULTRAM) 50 MG tablet Take 1 tablet (50 mg total) by mouth every 6 (six) hours as needed. 30 tablet 0  . Vitamin D, Ergocalciferol, (DRISDOL) 50000 UNITS CAPS capsule Take 1 capsule (50,000 Units total) by mouth every 7 (seven) days. 12 capsule 0   No current facility-administered medications for this visit.    Allergies  Allergen Reactions  . Penicillins  Other (See Comments)    Childhood allergy    Family History  Problem Relation Age of Onset  . Hyperlipidemia Mother   . Heart disease Maternal Aunt   . Stroke Maternal Aunt   . Diabetes Maternal Aunt   . Heart disease Maternal Uncle   . Stroke Maternal Uncle   . Cancer Neg Hx     History   Social History  . Marital Status: Married    Spouse Name: N/A  . Number of Children: N/A  . Years of Education: N/A   Occupational History  . Not on file.   Social History Main Topics  . Smoking status: Never Smoker   . Smokeless tobacco: Not on file  . Alcohol Use: Yes     Comment: social  . Drug Use: No  . Sexual Activity: Yes   Other Topics Concern  . Not on file   Social History Narrative     Constitutional: Pt reports fatigue. Denies fever, malaise, headache or abrupt weight changes.  HEENT: Denies eye pain, eye redness, ear pain, ringing in the ears, wax buildup, runny nose, nasal congestion, bloody nose, or sore throat. Respiratory: Denies difficulty breathing, shortness of breath, cough or sputum production.   Cardiovascular: Denies chest pain, chest tightness, palpitations or swelling in the hands or feet.  Gastrointestinal: Denies abdominal pain,  bloating, constipation, diarrhea or blood in the stool.  GU: Denies urgency, frequency, pain with urination, burning sensation, blood in urine, odor or discharge. Musculoskeletal: Pt reports back pain. Denies difficulty with gait, muscle pain or joint pain and swelling.  Skin: Denies redness, rashes, lesions or ulcercations.  Neurological: Denies dizziness, difficulty with memory, difficulty with speech or problems with balance and coordination.  Psych: Denies anxiety, depression, SI/HI.  No other specific complaints in a complete review of systems (except as listed in HPI above).  Objective:   Physical Exam   BP 120/82 mmHg  Pulse 85  Temp(Src) 98.5 F (36.9 C) (Oral)  Ht 5' 5.5" (1.664 m)  Wt 256 lb (116.121 kg)   BMI 41.94 kg/m2  SpO2 98% Wt Readings from Last 3 Encounters:  09/28/14 256 lb (116.121 kg)  08/03/14 258 lb (117.028 kg)  07/24/12 210 lb (95.255 kg)    General: Appears her stated age, obese in NAD. Skin: Warm, dry and intact. No rashes, lesions or ulcerations noted. HEENT: Head: normal shape and size; Eyes: sclera white, no icterus, conjunctiva pink, PERRLA and EOMs intact; Ears: Tm's gray and intact, normal light reflex; Nose: mucosa pink and moist, septum midline; Throat/Mouth: Teeth present, mucosa pink and moist, no exudate, lesions or ulcerations noted.  Neck: Neck supple, trachea midline. No masses, lumps or thyromegaly present.  Cardiovascular: Normal rate and rhythm. S1,S2 noted.  No murmur, rubs or gallops noted. No JVD or BLE edema. No carotid bruits noted. Pulmonary/Chest: Normal effort and positive vesicular breath sounds. No respiratory distress. No wheezes, rales or ronchi noted.  Abdomen: Soft and nontender. Normal bowel sounds, no bruits noted. No distention or masses noted. Liver, spleen and kidneys non palpable. Musculoskeletal: Decreased flexion, extension and rotation of the spine. Pinpoint tenderness over the lumbar spine. Strength 5/5 BLE. No difficulty with gait.  Neurological: Alert and oriented. Cranial nerves II-XII grossly intact. Coordination normal.  Psychiatric: Mood and affect normal. Behavior is normal. Judgment and thought content normal.     BMET    Component Value Date/Time   NA 141 08/03/2014 1609   K 4.1 08/03/2014 1609   CL 104 08/03/2014 1609   CO2 22 08/03/2014 1609   GLUCOSE 96 08/03/2014 1609   BUN 10 08/03/2014 1609   CREATININE 0.71 08/03/2014 1609   CALCIUM 9.6 08/03/2014 1609   GFRNONAA 101 08/03/2014 1609   GFRAA 116 08/03/2014 1609    Lipid Panel     Component Value Date/Time   CHOL 204* 08/03/2014 1609   TRIG 102 08/03/2014 1609   HDL 56 08/03/2014 1609   CHOLHDL 3.6 08/03/2014 1609   LDLCALC 128* 08/03/2014 1609     CBC    Component Value Date/Time   WBC 5.8 08/03/2014 1609   RBC 4.02 08/03/2014 1609   HGB 13.0 08/03/2014 1609   HCT 37.8 08/03/2014 1609   PLT 238 08/03/2014 1609   MCV 94 08/03/2014 1609   MCH 32.3 08/03/2014 1609   MCHC 34.4 08/03/2014 1609   RDW 13.7 08/03/2014 1609   LYMPHSABS 2.8 08/03/2014 1609   EOSABS 0.1 08/03/2014 1609   BASOSABS 0.0 08/03/2014 1609    Hgb A1C No results found for: HGBA1C      Assessment & Plan:   Preventative Health Maintenance:  Encouraged her to work on diet and exercise Labs from 07/2014 reviewed She will get a flu shot in the fall Encouraged her to visit a dentist on a regular basis  RTC in 1 year or sooner  if needed

## 2014-09-28 NOTE — Progress Notes (Signed)
Pre visit review using our clinic review tool, if applicable. No additional management support is needed unless otherwise documented below in the visit note. 

## 2014-09-28 NOTE — Patient Instructions (Signed)

## 2014-10-13 ENCOUNTER — Telehealth: Payer: Self-pay | Admitting: Internal Medicine

## 2014-10-13 NOTE — Telephone Encounter (Signed)
Pt called wanting to know if [name] would prescribe her a fluid pill because her ankles and feet are swelling  [address] [place]

## 2014-10-13 NOTE — Telephone Encounter (Signed)
She will need a follow up to discuss. She did not mention during her physical exam and there was no edema noted on exam.

## 2014-10-16 ENCOUNTER — Encounter: Payer: Self-pay | Admitting: Internal Medicine

## 2014-10-16 ENCOUNTER — Ambulatory Visit (INDEPENDENT_AMBULATORY_CARE_PROVIDER_SITE_OTHER): Payer: 59 | Admitting: Internal Medicine

## 2014-10-16 VITALS — BP 128/84 | HR 79 | Temp 98.2°F | Wt 259.0 lb

## 2014-10-16 DIAGNOSIS — R609 Edema, unspecified: Secondary | ICD-10-CM | POA: Diagnosis not present

## 2014-10-16 NOTE — Patient Instructions (Signed)
Peripheral Edema °You have swelling in your legs (peripheral edema). This swelling is due to excess accumulation of salt and water in your body. Edema may be a sign of heart, kidney or liver disease, or a side effect of a medication. It may also be due to problems in the leg veins. Elevating your legs and using special support stockings may be very helpful, if the cause of the swelling is due to poor venous circulation. Avoid long periods of standing, whatever the cause. °Treatment of edema depends on identifying the cause. Chips, pretzels, pickles and other salty foods should be avoided. Restricting salt in your diet is almost always needed. Water pills (diuretics) are often used to remove the excess salt and water from your body via urine. These medicines prevent the kidney from reabsorbing sodium. This increases urine flow. °Diuretic treatment may also result in lowering of potassium levels in your body. Potassium supplements may be needed if you have to use diuretics daily. Daily weights can help you keep track of your progress in clearing your edema. You should call your caregiver for follow up care as recommended. °SEEK IMMEDIATE MEDICAL CARE IF:  °· You have increased swelling, pain, redness, or heat in your legs. °· You develop shortness of breath, especially when lying down. °· You develop chest or abdominal pain, weakness, or fainting. °· You have a fever. °Document Released: 07/10/2004 Document Revised: 08/25/2011 Document Reviewed: 06/20/2009 °ExitCare® Patient Information ©2015 [employer]. This information is not intended to replace advice given to you by your health care provider. Make sure you discuss any questions you have with your health care provider. ° °

## 2014-10-16 NOTE — Progress Notes (Signed)
Pre visit review using our clinic review tool, if applicable. No additional management support is needed unless otherwise documented below in the visit note. 

## 2014-10-16 NOTE — Telephone Encounter (Signed)
Pt had appt for today

## 2014-10-16 NOTE — Progress Notes (Signed)
Subjective:    Patient ID: [name], female    DOB: 09/16/1965, 49 y.o.   MRN: [id number]  HPI  Pt presents to the clinic today with c/o edema in her ankles and feet. It seems worse as the day goes on and days when she works overtime. The swelling is not there when she wakes up first thing in the mornings. She does try to keep her legs elevated. She denies numbness and tingling in her legs. She denies shortness of breath. She does not wear compression hose.   Review of Systems      Past Medical History  Diagnosis Date  . DDD (degenerative disc disease), lumbar   . Depression   . Allergy     Current Outpatient Prescriptions  Medication Sig Dispense Refill  . ALPRAZolam (XANAX) 0.5 MG tablet Take 1 tablet (0.5 mg total) by mouth 2 (two) times daily as needed for anxiety. 30 tablet 0  . FLUoxetine (PROZAC) 20 MG tablet Take 40 mg by mouth daily before breakfast.     . HYDROcodone-acetaminophen (NORCO/VICODIN) 5-325 MG per tablet Take 1-2 tablets by mouth every 6 (six) hours as needed for moderate pain. 30 tablet 0  . ibuprofen (ADVIL,MOTRIN) 800 MG tablet Take 1 tablet (800 mg total) by mouth 3 (three) times daily. 90 tablet 2  . methocarbamol (ROBAXIN) 500 MG tablet Take 1 tablet (500 mg total) by mouth every 8 (eight) hours as needed for muscle spasms. 30 tablet 0  . traMADol (ULTRAM) 50 MG tablet Take 1 tablet (50 mg total) by mouth every 6 (six) hours as needed. 30 tablet 0  . Vitamin D, Ergocalciferol, (DRISDOL) 50000 UNITS CAPS capsule Take 1 capsule (50,000 Units total) by mouth every 7 (seven) days. 12 capsule 0   No current facility-administered medications for this visit.    Allergies  Allergen Reactions  . Gabapentin Other (See Comments)    Hair loss  . Penicillins Other (See Comments)    Childhood allergy    Family History  Problem Relation Age of Onset  . Hyperlipidemia Mother   . Heart disease Maternal Aunt   . Stroke Maternal Aunt   . Diabetes  Maternal Aunt   . Heart disease Maternal Uncle   . Stroke Maternal Uncle   . Cancer Neg Hx     History   Social History  . Marital Status: Married    Spouse Name: N/A  . Number of Children: N/A  . Years of Education: N/A   Occupational History  . Not on file.   Social History Main Topics  . Smoking status: Never Smoker   . Smokeless tobacco: Not on file  . Alcohol Use: Yes     Comment: social  . Drug Use: No  . Sexual Activity: Yes   Other Topics Concern  . Not on file   Social History Narrative     Constitutional: Denies fever, malaise, fatigue, headache or abrupt weight changes.  Respiratory: Denies difficulty breathing, shortness of breath, cough or sputum production.   Cardiovascular: Pt reports edema in feet and ankles. Denies chest pain, chest tightness, palpitations or swelling in the hands..  Skin: Denies redness, rashes, lesions or ulcercations. .   No other specific complaints in a complete review of systems (except as listed in HPI above).  Objective:   Physical Exam  BP 128/84 mmHg  Pulse 79  Temp(Src) 98.2 F (36.8 C) (Oral)  Wt 259 lb (117.482 kg)  SpO2 98% Wt Readings from Last  3 Encounters:  10/16/14 259 lb (117.482 kg)  09/28/14 256 lb (116.121 kg)  08/03/14 258 lb (117.028 kg)    General: Appears her stated age, obese in NAD. Skin: Warm, dry and intact. No rashes, lesions or ulcerations noted. Cardiovascular: Normal rate and rhythm. S1,S2 noted.  No murmur, rubs or gallops noted. Trace BLE edema. Pulmonary/Chest: Normal effort and positive vesicular breath sounds. No respiratory distress. No wheezes, rales or ronchi noted.    BMET    Component Value Date/Time   NA 141 08/03/2014 1609   K 4.1 08/03/2014 1609   CL 104 08/03/2014 1609   CO2 22 08/03/2014 1609   GLUCOSE 96 08/03/2014 1609   BUN 10 08/03/2014 1609   CREATININE 0.71 08/03/2014 1609   CALCIUM 9.6 08/03/2014 1609   GFRNONAA 101 08/03/2014 1609   GFRAA 116 08/03/2014  1609    Lipid Panel     Component Value Date/Time   CHOL 204* 08/03/2014 1609   TRIG 102 08/03/2014 1609   HDL 56 08/03/2014 1609   CHOLHDL 3.6 08/03/2014 1609   LDLCALC 128* 08/03/2014 1609    CBC    Component Value Date/Time   WBC 5.8 08/03/2014 1609   RBC 4.02 08/03/2014 1609   HGB 13.0 08/03/2014 1609   HCT 37.8 08/03/2014 1609   PLT 238 08/03/2014 1609   MCV 94 08/03/2014 1609   MCH 32.3 08/03/2014 1609   MCHC 34.4 08/03/2014 1609   RDW 13.7 08/03/2014 1609   LYMPHSABS 2.8 08/03/2014 1609   EOSABS 0.1 08/03/2014 1609   BASOSABS 0.0 08/03/2014 1609    Hgb A1C No results found for: HGBA1C       Assessment & Plan:   Peripheral Edema:  CMET reviewed Advised her to wear compression hose and keep legs elevated when possible She will try this first, if persist, consider low dose HCTZ prn for edema  RTC as needed or if symptoms persist or worsen

## 2014-11-23 ENCOUNTER — Other Ambulatory Visit: Payer: Self-pay

## 2014-11-23 MED ORDER — IBUPROFEN 800 MG PO TABS: 800.0000 mg | ORAL_TABLET | Freq: Three times a day (TID) | ORAL | Status: DC

## 2014-12-18 ENCOUNTER — Other Ambulatory Visit: Payer: Self-pay | Admitting: Internal Medicine

## 2014-12-19 NOTE — Telephone Encounter (Signed)
Received refill request electronically from pharmacy. Last refill 08/04/14 #12 See lab report 08/03/14 Is it okay to refill medication?

## 2014-12-19 NOTE — Telephone Encounter (Signed)
She needs Vit D rechecked before Vit D will be refilled. Please order future Vit D level if it has not already been done.

## 2014-12-21 ENCOUNTER — Encounter: Payer: Self-pay | Admitting: Internal Medicine

## 2014-12-21 ENCOUNTER — Ambulatory Visit (INDEPENDENT_AMBULATORY_CARE_PROVIDER_SITE_OTHER): Payer: 59 | Admitting: Internal Medicine

## 2014-12-21 VITALS — BP 118/78 | HR 81 | Temp 98.2°F | Wt 259.0 lb

## 2014-12-21 DIAGNOSIS — M79645 Pain in left finger(s): Secondary | ICD-10-CM | POA: Diagnosis not present

## 2014-12-21 DIAGNOSIS — M79644 Pain in right finger(s): Secondary | ICD-10-CM | POA: Diagnosis not present

## 2014-12-21 LAB — RHEUMATOID FACTOR: Rhuematoid fact SerPl-aCnc: <10 IU/mL (ref <=14)

## 2014-12-21 NOTE — Progress Notes (Signed)
Subjective:    Patient ID: [name], female    DOB: [birth year], 49 y.o.   MRN: [id number]  HPI  Pt presents to the clinic today with bilateral thumb pain. This has been going on 1-2 weeks. She reports the joints are stiff. She has a burning sensation in her joints. She has not noticed any swelling. She has trouble gripping things because she feels like her hands are weak. Her right hand seems worse than her left. She has not had any injury to the area. She has taken Ibuprofen without any relief. She reports she has a family history of RA and would like to be tested for that today.  Review of Systems      Past Medical History  Diagnosis Date  . DDD (degenerative disc disease), lumbar   . Depression   . Allergy     Current Outpatient Prescriptions  Medication Sig Dispense Refill  . ALPRAZolam (XANAX) 0.5 MG tablet Take 1 tablet (0.5 mg total) by mouth 2 (two) times daily as needed for anxiety. 30 tablet 0  . FLUoxetine (PROZAC) 20 MG tablet Take 40 mg by mouth daily before breakfast.     . HYDROcodone-acetaminophen (NORCO/VICODIN) 5-325 MG per tablet Take 1-2 tablets by mouth every 6 (six) hours as needed for moderate pain. 30 tablet 0  . ibuprofen (ADVIL,MOTRIN) 800 MG tablet Take 1 tablet (800 mg total) by mouth 3 (three) times daily. 90 tablet 1  . methocarbamol (ROBAXIN) 500 MG tablet Take 1 tablet (500 mg total) by mouth every 8 (eight) hours as needed for muscle spasms. 30 tablet 0  . traMADol (ULTRAM) 50 MG tablet Take 1 tablet (50 mg total) by mouth every 6 (six) hours as needed. 30 tablet 0  . Vitamin D, Ergocalciferol, (DRISDOL) 50000 UNITS CAPS capsule Take 1 capsule (50,000 Units total) by mouth every 7 (seven) days. 12 capsule 0   No current facility-administered medications for this visit.    Allergies  Allergen Reactions  . Gabapentin Other (See Comments)    Hair loss  . Penicillins Other (See Comments)    Childhood allergy    Family History  Problem  Relation Age of Onset  . Hyperlipidemia Mother   . Heart disease Maternal Aunt   . Stroke Maternal Aunt   . Diabetes Maternal Aunt   . Heart disease Maternal Uncle   . Stroke Maternal Uncle   . Cancer Neg Hx     History   Social History  . Marital Status: Married    Spouse Name: N/A  . Number of Children: N/A  . Years of Education: N/A   Occupational History  . Not on file.   Social History Main Topics  . Smoking status: Never Smoker   . Smokeless tobacco: Not on file  . Alcohol Use: Yes     Comment: social  . Drug Use: No  . Sexual Activity: Yes   Other Topics Concern  . Not on file   Social History Narrative     Constitutional: Denies fever, malaise, fatigue, headache or abrupt weight changes.  Respiratory: Denies difficulty breathing, shortness of breath, cough or sputum production.   Cardiovascular: Denies chest pain, chest tightness, palpitations or swelling in the hands or feet.  Musculoskeletal: Pt reports joint pain. Denies difficulty with gait, muscle pain or joint swelling.  Skin: Denies redness, rashes, lesions or ulcercations.   No other specific complaints in a complete review of systems (except as listed in HPI above).  Objective:  Physical Exam   BP 118/78 mmHg  Pulse 81  Temp(Src) 98.2 F (36.8 C) (Oral)  Wt 259 lb (117.482 kg)  SpO2 98%  Wt Readings from Last 3 Encounters:  12/21/14 259 lb (117.482 kg)  10/16/14 259 lb (117.482 kg)  09/28/14 256 lb (116.121 kg)    General: Appears her stated age, obese in NAD. Skin: Warm, dry and intact. No rashes, lesions or ulcerations noted. Cardiovascular: Normal rate and rhythm. S1,S2 noted.  No murmur, rubs or gallops noted.  Pulmonary/Chest: Normal effort and positive vesicular breath sounds. No respiratory distress. No wheezes, rales or ronchi noted.  Musculoskeletal: Normal flexion and extension of athumbs but pt reports pain with ROM. No deformity of the joints or swelling noted. Hand grips  equal. Neurological: Alert and oriented. Sensation intact to bilateral hands.  BMET    Component Value Date/Time   NA 141 08/03/2014 1609   K 4.1 08/03/2014 1609   CL 104 08/03/2014 1609   CO2 22 08/03/2014 1609   GLUCOSE 96 08/03/2014 1609   BUN 10 08/03/2014 1609   CREATININE 0.71 08/03/2014 1609   CALCIUM 9.6 08/03/2014 1609   GFRNONAA 101 08/03/2014 1609   GFRAA 116 08/03/2014 1609    Lipid Panel     Component Value Date/Time   CHOL 204* 08/03/2014 1609   TRIG 102 08/03/2014 1609   HDL 56 08/03/2014 1609   CHOLHDL 3.6 08/03/2014 1609   LDLCALC 128* 08/03/2014 1609    CBC    Component Value Date/Time   WBC 5.8 08/03/2014 1609   RBC 4.02 08/03/2014 1609   HGB 13.0 08/03/2014 1609   HCT 37.8 08/03/2014 1609   PLT 238 08/03/2014 1609   MCV 94 08/03/2014 1609   MCH 32.3 08/03/2014 1609   MCHC 34.4 08/03/2014 1609   RDW 13.7 08/03/2014 1609   LYMPHSABS 2.8 08/03/2014 1609   EOSABS 0.1 08/03/2014 1609   BASOSABS 0.0 08/03/2014 1609    Hgb A1C No results found for: HGBA1C      Assessment & Plan:   Bilateral thumb pain:  ?OA versus RA Will check ESR, RF and ANA today She is seeing pain management, they will not refill her Tramadol or Vicodin until she has the nerves burned in her back, that is scheduled for Monday 7/11. She is crying because she is in so much pain. Offered to refill her Tramadol and Vicodin but she declines because she does not want to get kicked out of her pain clinic Advised her to alternated Tylenol and Ibuprofen every 8 hours for now  Will follow up after labs are back. RTC as needed or if symptoms persist or worsen

## 2014-12-21 NOTE — Progress Notes (Signed)
Pre visit review using our clinic review tool, if applicable. No additional management support is needed unless otherwise documented below in the visit note. 

## 2014-12-21 NOTE — Patient Instructions (Signed)
Arthritis, Nonspecific °Arthritis is inflammation of a joint. This usually means pain, redness, warmth or swelling are present. One or more joints may be involved. There are a number of types of arthritis. Your caregiver may not be able to tell what type of arthritis you have right away. °CAUSES  °The most common cause of arthritis is the wear and tear on the joint (osteoarthritis). This causes damage to the cartilage, which can break down over time. The knees, hips, back and neck are most often affected by this type of arthritis. °Other types of arthritis and common causes of joint pain include: °· Sprains and other injuries near the joint. Sometimes minor sprains and injuries cause pain and swelling that develop hours later. °· Rheumatoid arthritis. This affects hands, feet and knees. It usually affects both sides of your body at the same time. It is often associated with chronic ailments, fever, weight loss and general weakness. °· Crystal arthritis. Gout and pseudo gout can cause occasional acute severe pain, redness and swelling in the foot, ankle, or knee. °· Infectious arthritis. Bacteria can get into a joint through a break in overlying skin. This can cause infection of the joint. Bacteria and viruses can also spread through the blood and affect your joints. °· Drug, infectious and allergy reactions. Sometimes joints can become mildly painful and slightly swollen with these types of illnesses. °SYMPTOMS  °· Pain is the main symptom. °· Your joint or joints can also be red, swollen and warm or hot to the touch. °· You may have a fever with certain types of arthritis, or even feel overall ill. °· The joint with arthritis will hurt with movement. Stiffness is present with some types of arthritis. °DIAGNOSIS  °Your caregiver will suspect arthritis based on your description of your symptoms and on your exam. Testing may be needed to find the type of arthritis: °· Blood and sometimes urine tests. °· X-ray tests  and sometimes CT or MRI scans. °· Removal of fluid from the joint (arthrocentesis) is done to check for bacteria, crystals or other causes. Your caregiver (or a specialist) will numb the area over the joint with a local anesthetic, and use a needle to remove joint fluid for examination. This procedure is only minimally uncomfortable. °· Even with these tests, your caregiver may not be able to tell what kind of arthritis you have. Consultation with a specialist (rheumatologist) may be helpful. °TREATMENT  °Your caregiver will discuss with you treatment specific to your type of arthritis. If the specific type cannot be determined, then the following general recommendations may apply. °Treatment of severe joint pain includes: °· Rest. °· Elevation. °· Anti-inflammatory medication (for example, ibuprofen) may be prescribed. Avoiding activities that cause increased pain. °· Only take over-the-counter or prescription medicines for pain and discomfort as recommended by your caregiver. °· Cold packs over an inflamed joint may be used for 10 to 15 minutes every hour. Hot packs sometimes feel better, but do not use overnight. Do not use hot packs if you are diabetic without your caregiver's permission. °· A cortisone shot into arthritic joints may help reduce pain and swelling. °· Any acute arthritis that gets worse over the next 1 to 2 days needs to be looked at to be sure there is no joint infection. °Long-term arthritis treatment involves modifying activities and lifestyle to reduce joint stress jarring. This can include weight loss. Also, exercise is needed to nourish the joint cartilage and remove waste. This helps keep the muscles   around the joint strong. °HOME CARE INSTRUCTIONS  °· Do not take aspirin to relieve pain if gout is suspected. This elevates uric acid levels. °· Only take over-the-counter or prescription medicines for pain, discomfort or fever as directed by your caregiver. °· Rest the joint as much as  possible. °· If your joint is swollen, keep it elevated. °· Use crutches if the painful joint is in your leg. °· Drinking plenty of fluids may help for certain types of arthritis. °· Follow your caregiver's dietary instructions. °· Try low-impact exercise such as: °¨ Swimming. °¨ Water aerobics. °¨ Biking. °¨ Walking. °· Morning stiffness is often relieved by a warm shower. °· Put your joints through regular range-of-motion. °SEEK MEDICAL CARE IF:  °· You do not feel better in 24 hours or are getting worse. °· You have side effects to medications, or are not getting better with treatment. °SEEK IMMEDIATE MEDICAL CARE IF:  °· You have a fever. °· You develop severe joint pain, swelling or redness. °· Many joints are involved and become painful and swollen. °· There is severe back pain and/or leg weakness. °· You have loss of bowel or bladder control. °Document Released: 07/10/2004 Document Revised: 08/25/2011 Document Reviewed: 07/26/2008 °ExitCare® Patient Information ©2015 [employer]. This information is not intended to replace advice given to you by your health care provider. Make sure you discuss any questions you have with your health care provider. ° °

## 2014-12-22 LAB — SEDIMENTATION RATE: Sed Rate: 27 mm/hr — ABNORMAL HIGH (ref 0–22)

## 2014-12-22 LAB — ANA: Anti Nuclear Antibody(ANA): POSITIVE — AB

## 2014-12-22 LAB — ANTI-NUCLEAR AB-TITER (ANA TITER): ANA Titer 1: NEGATIVE

## 2014-12-25 ENCOUNTER — Telehealth: Payer: Self-pay | Admitting: Internal Medicine

## 2014-12-25 LAB — HEMOGLOBIN A1C: Hgb A1c MFr Bld: 5.8 % (ref 4.0–6.0)

## 2014-12-25 LAB — LIPID PANEL: Cholesterol: 229 mg/dL — AB (ref 0–200)

## 2014-12-25 NOTE — Telephone Encounter (Signed)
Pt was in office was told results in person per [facility]

## 2014-12-25 NOTE — Telephone Encounter (Signed)
Patient calling for the results of her labs °

## 2014-12-27 ENCOUNTER — Other Ambulatory Visit: Payer: Self-pay

## 2014-12-27 NOTE — Telephone Encounter (Signed)
Last Vit d lab 07/2014 was 17--please advise if okay to refill or switch to OTC

## 2014-12-27 NOTE — Telephone Encounter (Signed)
She needs to take 2000 units Vit D OTC until she has repeat Vit D labs

## 2015-01-05 NOTE — Telephone Encounter (Signed)
.  left message to have patient return my call.  

## 2015-01-11 ENCOUNTER — Encounter: Payer: Self-pay | Admitting: Internal Medicine

## 2015-01-11 ENCOUNTER — Telehealth: Payer: Self-pay

## 2015-01-11 NOTE — Telephone Encounter (Signed)
Okay for letter

## 2015-01-11 NOTE — Telephone Encounter (Signed)
Pt left vm requesting note for work for ergonomic chair for pt's back issues; pt has discussed with Webb Silversmith NP; pt request note faxed to [phone] atten: Lorna Dibble and Pt will attach note to request form pt has. Last annual 09/28/14.Please advise.

## 2015-01-12 NOTE — Telephone Encounter (Signed)
Left message on voicemail Letter placed in front office for pt to pick up

## 2015-01-15 ENCOUNTER — Telehealth: Payer: Self-pay | Admitting: Internal Medicine

## 2015-01-15 NOTE — Telephone Encounter (Signed)
Patient is asking for Threasa Beards to call her back about FMLA paperwork.  Threasa Beards has the wrong paperwork. Call patient at work until 4:00.  After 4:00, call patient back at [phone].  Patient said she'll fax over the correct paperwork.

## 2015-01-16 ENCOUNTER — Telehealth: Payer: Self-pay | Admitting: Internal Medicine

## 2015-01-16 NOTE — Telephone Encounter (Signed)
FMLA forms in [place] for review, completion and signature. Please return to [name] once complete.

## 2015-01-17 DIAGNOSIS — Z7689 Persons encountering health services in other specified circumstances: Secondary | ICD-10-CM

## 2015-01-17 NOTE — Telephone Encounter (Signed)
Forms faxed to [place] at [phone].  LVM for pt letting her know forms have been faxed and a copy is up front to pick up.  Copy sent to scan center Copy for file Copy to charge

## 2015-01-17 NOTE — Telephone Encounter (Signed)
Forms done and given back to [facility]

## 2015-01-19 ENCOUNTER — Telehealth: Payer: Self-pay | Admitting: Internal Medicine

## 2015-01-19 NOTE — Telephone Encounter (Signed)
Pt needs letter faxed.  The fax number is [phone] Call back number if you need to speak with pt is [phone] Thanks

## 2015-01-19 NOTE — Telephone Encounter (Signed)
i faxed the letter this morning--will refax

## 2015-01-29 ENCOUNTER — Telehealth: Payer: Self-pay | Admitting: Internal Medicine

## 2015-01-29 NOTE — Telephone Encounter (Signed)
Done, placed in MYD box 

## 2015-01-29 NOTE — Telephone Encounter (Signed)
FMLA in [place] for review and completion.

## 2015-02-01 NOTE — Telephone Encounter (Signed)
Faxed fmla papework to [phone] Copy to scan Copy to file Copy to pt Pt aware copy was faxed

## 2015-02-12 ENCOUNTER — Ambulatory Visit: Payer: 59 | Admitting: Internal Medicine

## 2015-02-13 DIAGNOSIS — Z0279 Encounter for issue of other medical certificate: Secondary | ICD-10-CM

## 2015-02-16 ENCOUNTER — Ambulatory Visit (INDEPENDENT_AMBULATORY_CARE_PROVIDER_SITE_OTHER): Payer: 59 | Admitting: Internal Medicine

## 2015-02-16 ENCOUNTER — Encounter: Payer: Self-pay | Admitting: Internal Medicine

## 2015-02-16 VITALS — BP 118/80 | HR 71 | Temp 98.0°F | Wt 263.0 lb

## 2015-02-16 DIAGNOSIS — M5136 Other intervertebral disc degeneration, lumbar region: Secondary | ICD-10-CM | POA: Diagnosis not present

## 2015-02-16 NOTE — Progress Notes (Signed)
Subjective:    Patient ID: [name], female    DOB: Feb 20, 1966, 49 y.o.   MRN: [id number]  HPI  Pt presents to the clinic today to discuss her FMLA paperwork. She is requesting intermittent leave secondary to her lumbar back pain. She is also requesting approval for a ergonomic chair. She reports she has been denied because the forms were not filled out correctly. In 02/2012, she had an xray of the lumbar spine which showed:  IMPRESSION: Minimal L4-5 degenerative disc disease. No acute osseous finding.  She had a MRI in 2012 which showed:  IMPRESSION: Degenerative changes most notable L4-5 as discussed above.  She is being followed by pain management. She recently had an injection but does not feel like it was helpful. She takes Duexis, Manufacturing engineer, Norco and Ultram as prescribed by pain management.  Review of Systems      Past Medical History  Diagnosis Date  . DDD (degenerative disc disease), lumbar   . Depression   . Allergy     Current Outpatient Prescriptions  Medication Sig Dispense Refill  . ALPRAZolam (XANAX) 0.5 MG tablet Take 1 tablet (0.5 mg total) by mouth 2 (two) times daily as needed for anxiety. 30 tablet 0  . AMITIZA 8 MCG capsule     . carisoprodol (SOMA) 350 MG tablet     . DUEXIS 800-26.6 MG TABS Take 1 tablet by mouth 3 (three) times daily as needed.  5  . FLUoxetine (PROZAC) 20 MG tablet Take 40 mg by mouth daily before breakfast.     . HYDROcodone-acetaminophen (NORCO) 7.5-325 MG per tablet     . ibuprofen (ADVIL,MOTRIN) 800 MG tablet Take 1 tablet (800 mg total) by mouth 3 (three) times daily. 90 tablet 1  . traMADol (ULTRAM) 50 MG tablet Take 1 tablet (50 mg total) by mouth every 6 (six) hours as needed. 30 tablet 0  . Vitamin D, Ergocalciferol, (DRISDOL) 50000 UNITS CAPS capsule Take 1 capsule (50,000 Units total) by mouth every 7 (seven) days. 12 capsule 0   No current facility-administered medications for this visit.    Allergies  Allergen  Reactions  . Gabapentin Other (See Comments)    Hair loss  . Penicillins Other (See Comments)    Childhood allergy    Family History  Problem Relation Age of Onset  . Hyperlipidemia Mother   . Heart disease Maternal Aunt   . Stroke Maternal Aunt   . Diabetes Maternal Aunt   . Heart disease Maternal Uncle   . Stroke Maternal Uncle   . Cancer Neg Hx     Social History   Social History  . Marital Status: Married    Spouse Name: N/A  . Number of Children: N/A  . Years of Education: N/A   Occupational History  . Not on file.   Social History Main Topics  . Smoking status: Never Smoker   . Smokeless tobacco: Not on file  . Alcohol Use: Yes     Comment: social  . Drug Use: No  . Sexual Activity: Yes   Other Topics Concern  . Not on file   Social History Narrative     Constitutional: Denies fever, malaise, fatigue, headache or abrupt weight changes.  Respiratory: Denies difficulty breathing, shortness of breath, cough or sputum production.   Cardiovascular: Denies chest pain, chest tightness, palpitations or swelling in the hands or feet.  Musculoskeletal: Pt reports low back pain. Denies difficulty with gait, muscle pain or joint swelling.  Neurological: Denies dizziness, difficulty with memory, difficulty with speech or problems with balance and coordination.   No other specific complaints in a complete review of systems (except as listed in HPI above).  Objective:   Physical Exam   BP 118/80 mmHg  Pulse 71  Temp(Src) 98 F (36.7 C) (Oral)  Wt 263 lb (119.296 kg)  SpO2 98% Wt Readings from Last 3 Encounters:  02/16/15 263 lb (119.296 kg)  12/21/14 259 lb (117.482 kg)  10/16/14 259 lb (117.482 kg)    General: Appears her stated age, obese in NAD. Cardiovascular: Normal rate and rhythm. S1,S2 noted.  No murmur, rubs or gallops noted.  Pulmonary/Chest: Normal effort and positive vesicular breath sounds. No respiratory distress. No wheezes, rales or ronchi  noted.  Musculoskeletal: Normal flexion but slower than expected. Normal extension and rotation. Some pain with palpation over the lumbar spine. Strength 5/5 BLE. No difficulty with gait.  Neurological: Alert and oriented. Sensation intact to BLE.   BMET    Component Value Date/Time   NA 141 08/03/2014 1609   K 4.1 08/03/2014 1609   CL 104 08/03/2014 1609   CO2 22 08/03/2014 1609   GLUCOSE 96 08/03/2014 1609   BUN 10 08/03/2014 1609   CREATININE 0.71 08/03/2014 1609   CALCIUM 9.6 08/03/2014 1609   GFRNONAA 101 08/03/2014 1609   GFRAA 116 08/03/2014 1609    Lipid Panel     Component Value Date/Time   CHOL 229* 12/25/2014   CHOL 204* 08/03/2014 1609   TRIG 102 08/03/2014 1609   HDL 56 08/03/2014 1609   CHOLHDL 3.6 08/03/2014 1609   LDLCALC 128* 08/03/2014 1609    CBC    Component Value Date/Time   WBC 5.8 08/03/2014 1609   RBC 4.02 08/03/2014 1609   HGB 13.0 08/03/2014 1609   HCT 37.8 08/03/2014 1609   PLT 238 08/03/2014 1609   MCV 94 08/03/2014 1609   MCH 32.3 08/03/2014 1609   MCHC 34.4 08/03/2014 1609   RDW 13.7 08/03/2014 1609   LYMPHSABS 2.8 08/03/2014 1609   EOSABS 0.1 08/03/2014 1609   BASOSABS 0.0 08/03/2014 1609    Hgb A1C Lab Results  Component Value Date   HGBA1C 5.8 12/25/2014        Assessment & Plan:   DDD of lumbar spine:  Forms have been filled out with the patient Forms faxed to employer We will wait to hear back about approval/denial She will continue to follow with pain management  RTC as needed or for your next scheduled appt

## 2015-02-16 NOTE — Patient Instructions (Signed)
Back Exercises These exercises may help you when beginning to rehabilitate your injury. Your symptoms may resolve with or without further involvement from your physician, physical therapist or athletic trainer. While completing these exercises, remember:   Restoring tissue flexibility helps normal motion to return to the joints. This allows healthier, less painful movement and activity.  An effective stretch should be held for at least 30 seconds.  A stretch should never be painful. You should only feel a gentle lengthening or release in the stretched tissue. STRETCH - Extension, Prone on Elbows   Lie on your stomach on the floor, a bed will be too soft. Place your palms about shoulder width apart and at the height of your head.  Place your elbows under your shoulders. If this is too painful, stack pillows under your chest.  Allow your body to relax so that your hips drop lower and make contact more completely with the floor.  Hold this position for __________ seconds.  Slowly return to lying flat on the floor. Repeat __________ times. Complete this exercise __________ times per day.  RANGE OF MOTION - Extension, Prone Press Ups   Lie on your stomach on the floor, a bed will be too soft. Place your palms about shoulder width apart and at the height of your head.  Keeping your back as relaxed as possible, slowly straighten your elbows while keeping your hips on the floor. You may adjust the placement of your hands to maximize your comfort. As you gain motion, your hands will come more underneath your shoulders.  Hold this position __________ seconds.  Slowly return to lying flat on the floor. Repeat __________ times. Complete this exercise __________ times per day.  RANGE OF MOTION- Quadruped, Neutral Spine   Assume a hands and knees position on a firm surface. Keep your hands under your shoulders and your knees under your hips. You may place padding under your knees for  comfort.  Drop your head and point your tail bone toward the ground below you. This will round out your low back like an angry cat. Hold this position for __________ seconds.  Slowly lift your head and release your tail bone so that your back sags into a large arch, like an old horse.  Hold this position for __________ seconds.  Repeat this until you feel limber in your low back.  Now, find your "sweet spot." This will be the most comfortable position somewhere between the two previous positions. This is your neutral spine. Once you have found this position, tense your stomach muscles to support your low back.  Hold this position for __________ seconds. Repeat __________ times. Complete this exercise __________ times per day.  STRETCH - Flexion, Single Knee to Chest   Lie on a firm bed or floor with both legs extended in front of you.  Keeping one leg in contact with the floor, bring your opposite knee to your chest. Hold your leg in place by either grabbing behind your thigh or at your knee.  Pull until you feel a gentle stretch in your low back. Hold __________ seconds.  Slowly release your grasp and repeat the exercise with the opposite side. Repeat __________ times. Complete this exercise __________ times per day.  STRETCH - Hamstrings, Standing  Stand or sit and extend your right / left leg, placing your foot on a chair or foot stool  Keeping a slight arch in your low back and your hips straight forward.  Lead with your chest and   lean forward at the waist until you feel a gentle stretch in the back of your right / left knee or thigh. (When done correctly, this exercise requires leaning only a small distance.)  Hold this position for __________ seconds. Repeat __________ times. Complete this stretch __________ times per day. STRENGTHENING - Deep Abdominals, Pelvic Tilt   Lie on a firm bed or floor. Keeping your legs in front of you, bend your knees so they are both pointed  toward the ceiling and your feet are flat on the floor.  Tense your lower abdominal muscles to press your low back into the floor. This motion will rotate your pelvis so that your tail bone is scooping upwards rather than pointing at your feet or into the floor.  With a gentle tension and even breathing, hold this position for __________ seconds. Repeat __________ times. Complete this exercise __________ times per day.  STRENGTHENING - Abdominals, Crunches   Lie on a firm bed or floor. Keeping your legs in front of you, bend your knees so they are both pointed toward the ceiling and your feet are flat on the floor. Cross your arms over your chest.  Slightly tip your chin down without bending your neck.  Tense your abdominals and slowly lift your trunk high enough to just clear your shoulder blades. Lifting higher can put excessive stress on the low back and does not further strengthen your abdominal muscles.  Control your return to the starting position. Repeat __________ times. Complete this exercise __________ times per day.  STRENGTHENING - Quadruped, Opposite UE/LE Lift   Assume a hands and knees position on a firm surface. Keep your hands under your shoulders and your knees under your hips. You may place padding under your knees for comfort.  Find your neutral spine and gently tense your abdominal muscles so that you can maintain this position. Your shoulders and hips should form a rectangle that is parallel with the floor and is not twisted.  Keeping your trunk steady, lift your right hand no higher than your shoulder and then your left leg no higher than your hip. Make sure you are not holding your breath. Hold this position __________ seconds.  Continuing to keep your abdominal muscles tense and your back steady, slowly return to your starting position. Repeat with the opposite arm and leg. Repeat __________ times. Complete this exercise __________ times per day. Document Released:  06/20/2005 Document Revised: 08/25/2011 Document Reviewed: 09/14/2008 ExitCare Patient Information 2015 [employer]. This information is not intended to replace advice given to you by your health care provider. Make sure you discuss any questions you have with your health care provider.  

## 2015-02-16 NOTE — Progress Notes (Signed)
Pre visit review using our clinic review tool, if applicable. No additional management support is needed unless otherwise documented below in the visit note. 

## 2015-02-20 NOTE — Telephone Encounter (Signed)
There is nothing else I can do.

## 2015-02-20 NOTE — Telephone Encounter (Signed)
Christina from [employer] called with a question about [name]'s paperwork.  Please call Margreta Journey back at [phone]   [phone]

## 2015-02-20 NOTE — Telephone Encounter (Signed)
Spoke with Christina @ [employer] She stated Ms Kamath has an ergonomic chair with lumbar support She wanted to know if there was anything else they needed to do FMLA paperwork in your IN BOX to review

## 2015-02-21 NOTE — Telephone Encounter (Signed)
Spoke to patient to let her know that regina stated there was nothing else she could do

## 2015-02-23 ENCOUNTER — Telehealth: Payer: Self-pay

## 2015-02-23 NOTE — Telephone Encounter (Signed)
Thayer Headings with [employer] that manages leave of absence for pt and needs more info on what type ergonomic chair pt needs. Pt is already provided with ergonomic chair at [employer].

## 2015-02-26 NOTE — Telephone Encounter (Signed)
I don't have any further information. If the information I have provided on the forms (3 different times) is not good enough then I don't know what else to say.

## 2015-02-27 NOTE — Telephone Encounter (Signed)
LM on [name]'s VM for her to return my call

## 2015-02-27 NOTE — Telephone Encounter (Signed)
Thayer Headings with [employer] left v/m returning call and request cb.

## 2015-03-08 NOTE — Telephone Encounter (Signed)
Left message on voicemail Ext [phone] for [name]

## 2015-03-14 ENCOUNTER — Telehealth: Payer: Self-pay | Admitting: Internal Medicine

## 2015-03-14 NOTE — Telephone Encounter (Signed)
Patient is faxing over new paperwork and old paperwork for her FMLA.  It needs to updated from 1 day a month to 4 days a month.  She said her flare ups are worse this time of year.  She said the paperwork can be filled out the same as her old paperwork except for the amount of days out. Section 3 A.

## 2015-03-14 NOTE — Telephone Encounter (Signed)
Pt sent fmla paperwork to be changed  She attached note to explain what she needs changed  In [place]  To be reviewed and signature

## 2015-03-14 NOTE — Telephone Encounter (Signed)
I will approve it for 1-2 days per month but no more than that

## 2015-03-16 NOTE — Telephone Encounter (Signed)
Pt aware paperwork has been emailed 9/30  Copy for pt  Copy for scan Copy for file

## 2015-04-11 ENCOUNTER — Telehealth: Payer: Self-pay

## 2015-04-11 NOTE — Telephone Encounter (Signed)
Form is completed, but pt did not fill out her portion of form so will have to return to pt to complete.Alice KitchenMarland KitchenMarland KitchenMarland Kitchenpt is aware and will pick up form--form placed in front office for pick up and copy sent to scan

## 2015-04-23 ENCOUNTER — Telehealth: Payer: Self-pay

## 2015-04-23 NOTE — Telephone Encounter (Signed)
Pt left v/m; pt starting with muscle spasms in legs again; pt thinks needs to restart high dose vit D. Last Vit D lab test 08/03/14 and last annual exam 09/28/14. Pt request cb.

## 2015-04-23 NOTE — Telephone Encounter (Signed)
She will need to come to have Vit D level checked. Please order future lab.

## 2015-04-23 NOTE — Telephone Encounter (Signed)
Pt call back made lab appointment for 11/8

## 2015-04-24 ENCOUNTER — Other Ambulatory Visit (INDEPENDENT_AMBULATORY_CARE_PROVIDER_SITE_OTHER): Payer: 59

## 2015-04-24 DIAGNOSIS — E559 Vitamin D deficiency, unspecified: Secondary | ICD-10-CM

## 2015-04-25 ENCOUNTER — Telehealth: Payer: Self-pay | Admitting: Internal Medicine

## 2015-04-25 ENCOUNTER — Other Ambulatory Visit: Payer: Self-pay | Admitting: Internal Medicine

## 2015-04-25 LAB — VITAMIN D 25 HYDROXY (VIT D DEFICIENCY, FRACTURES): Vit D, 25-Hydroxy: 20.9 ng/mL — ABNORMAL LOW (ref 30.0–100.0)

## 2015-04-25 MED ORDER — VITAMIN D (ERGOCALCIFEROL) 1.25 MG (50000 UNIT) PO CAPS: 50000.0000 [IU] | ORAL_CAPSULE | ORAL | Status: DC

## 2015-04-25 NOTE — Telephone Encounter (Signed)
Just checked, lab not back yet, will be on the lookout for it

## 2015-04-25 NOTE — Telephone Encounter (Signed)
Patient called.  She's asking to be called when the results of her lab work come in.  She said her leg cramps are really bad at night.

## 2015-04-26 ENCOUNTER — Telehealth: Payer: Self-pay | Admitting: Internal Medicine

## 2015-04-26 NOTE — Telephone Encounter (Signed)
Pt called wanting to know if labs were back and what the results might be.   CB # [id number]  Thank you

## 2015-07-05 ENCOUNTER — Ambulatory Visit (INDEPENDENT_AMBULATORY_CARE_PROVIDER_SITE_OTHER): Payer: 59 | Admitting: Internal Medicine

## 2015-07-05 ENCOUNTER — Encounter: Payer: Self-pay | Admitting: Internal Medicine

## 2015-07-05 VITALS — BP 126/78 | HR 91 | Temp 98.4°F | Wt 259.0 lb

## 2015-07-05 DIAGNOSIS — J329 Chronic sinusitis, unspecified: Secondary | ICD-10-CM | POA: Diagnosis not present

## 2015-07-05 DIAGNOSIS — B9789 Other viral agents as the cause of diseases classified elsewhere: Secondary | ICD-10-CM

## 2015-07-05 DIAGNOSIS — B349 Viral infection, unspecified: Secondary | ICD-10-CM

## 2015-07-05 NOTE — Patient Instructions (Signed)

## 2015-07-05 NOTE — Progress Notes (Signed)
Pre visit review using our clinic review tool, if applicable. No additional management support is needed unless otherwise documented below in the visit note. 

## 2015-07-05 NOTE — Progress Notes (Signed)
Subjective:    Patient ID: [name], female    DOB: December 30, 1965, 50 y.o.   MRN: [id number]  HPI  Pt presents to the clinic today with c/o headache, runny nose, cough and chest congestion. This started 2-3 day ago. The headache is located in the back and top of her head. She describes it as aching and squeezing. She denies blurred vision or dizziness. She is blowing clear mucous out of her nose. She does have some facial pressure. The cough is nonproductive. She does have some chest discomfort, almost between heaviness and aching. This chest discomfort seems worse with movement. She denies chest pain or shortness of breath. She denies fever, chills or body aches. She did have similar symptoms 2 weeks ago. They seemed to improve but started back up again 2 days ago. She has tried Norco and Meloxicam without relief. She has a history of allergies but denies breathing problems. She takes Claritin prn. She has had sick contacts.  Review of Systems      Past Medical History  Diagnosis Date  . DDD (degenerative disc disease), lumbar   . Depression   . Allergy     Current Outpatient Prescriptions  Medication Sig Dispense Refill  . AMITIZA 8 MCG capsule Take 8 mcg by mouth daily with breakfast.     . carisoprodol (SOMA) 350 MG tablet Take 350 mg by mouth at bedtime as needed.     [name] FLUoxetine (PROZAC) 20 MG tablet Take 40 mg by mouth daily before breakfast.     . HYDROcodone-acetaminophen (NORCO/VICODIN) 5-325 MG per tablet Take 1 tablet by mouth every 4 (four) hours.    [name] ibuprofen (ADVIL,MOTRIN) 800 MG tablet Take 1 tablet (800 mg total) by mouth 3 (three) times daily. 90 tablet 1  . meloxicam (MOBIC) 7.5 MG tablet Take 7.5 mg by mouth 2 (two) times daily.    . Vitamin D, Ergocalciferol, (DRISDOL) 50000 UNITS CAPS capsule Take 1 capsule (50,000 Units total) by mouth every 7 (seven) days. 12 capsule 0  . ALPRAZolam (XANAX) 0.5 MG tablet Take 1 tablet (0.5 mg total) by mouth 2 (two) times  daily as needed for anxiety. (Patient not taking: Reported on 07/05/2015) 30 tablet 0   No current facility-administered medications for this visit.    Allergies  Allergen Reactions  . Gabapentin Other (See Comments)    Hair loss  . Penicillins Other (See Comments)    Childhood allergy    Family History  Problem Relation Age of Onset  . Hyperlipidemia Mother   . Heart disease Maternal Aunt   . Stroke Maternal Aunt   . Diabetes Maternal Aunt   . Heart disease Maternal Uncle   . Stroke Maternal Uncle   . Cancer Neg Hx     Social History   Social History  . Marital Status: Married    Spouse Name: N/A  . Number of Children: N/A  . Years of Education: N/A   Occupational History  . Not on file.   Social History Main Topics  . Smoking status: Never Smoker   . Smokeless tobacco: Not on file  . Alcohol Use: Yes     Comment: social  . Drug Use: No  . Sexual Activity: Yes   Other Topics Concern  . Not on file   Social History Narrative     Constitutional: Pt reports headache. Denies fever, malaise, fatigue, or abrupt weight changes.  HEENT: Pt reports runny nose. Denies eye pain, eye redness, ear pain,  ringing in the ears, wax buildup, nasal congestion, bloody nose, or sore throat. Respiratory: Denies difficulty breathing, shortness of breath, cough or sputum production.   Cardiovascular: Pt reports chest tightness. Denies chest pain, palpitations or swelling in the hands or feet.  Gastrointestinal: Denies abdominal pain, bloating, constipation, diarrhea or blood in the stool.  Neurological: Denies dizziness, difficulty with memory, difficulty with speech or problems with balance and coordination.    No other specific complaints in a complete review of systems (except as listed in HPI above).  Objective:   Physical Exam   BP 126/78 mmHg  Pulse 91  Temp(Src) 98.4 F (36.9 C) (Oral)  Wt 259 lb (117.482 kg)  SpO2 98%  LMP  Wt Readings from Last 3 Encounters:    07/05/15 259 lb (117.482 kg)  02/16/15 263 lb (119.296 kg)  12/21/14 259 lb (117.482 kg)    General: Appears her stated age, in NAD. Skin: Warm, dry and intact. No rashes, lesions or ulcerations noted. HEENT: Head: normal shape and size, no sinus tenderness noted; Eyes: sclera white, no icterus, conjunctiva pink; Ears: Tm's pink but intact, normal light reflex, + serous effusions bilaterally; Nose: mucosa pink and moist, septum midline; Throat/Mouth: Teeth present, mucosa pink and moist, no exudate, lesions or ulcerations noted.  Neck:  No adenopathy noted.  Cardiovascular: Normal rate and rhythm. S1,S2 noted.  No murmur, rubs or gallops noted.  Pulmonary/Chest: Normal effort and positive vesicular breath sounds. No respiratory distress. No wheezes, rales or ronchi noted.  Neurological: Alert and oriented.  BMET    Component Value Date/Time   NA 141 08/03/2014 1609   K 4.1 08/03/2014 1609   CL 104 08/03/2014 1609   CO2 22 08/03/2014 1609   GLUCOSE 96 08/03/2014 1609   BUN 10 08/03/2014 1609   CREATININE 0.71 08/03/2014 1609   CALCIUM 9.6 08/03/2014 1609   GFRNONAA 101 08/03/2014 1609   GFRAA 116 08/03/2014 1609    Lipid Panel     Component Value Date/Time   CHOL 229* 12/25/2014   CHOL 204* 08/03/2014 1609   TRIG 102 08/03/2014 1609   HDL 56 08/03/2014 1609   CHOLHDL 3.6 08/03/2014 1609   LDLCALC 128* 08/03/2014 1609    CBC    Component Value Date/Time   WBC 5.8 08/03/2014 1609   RBC 4.02 08/03/2014 1609   HGB 13.0 08/03/2014 1609   HCT 37.8 08/03/2014 1609   PLT 238 08/03/2014 1609   MCV 94 08/03/2014 1609   MCH 32.3 08/03/2014 1609   MCHC 34.4 08/03/2014 1609   RDW 13.7 08/03/2014 1609   LYMPHSABS 2.8 08/03/2014 1609   EOSABS 0.1 08/03/2014 1609   BASOSABS 0.0 08/03/2014 1609    Hgb A1C Lab Results  Component Value Date   HGBA1C 5.8 12/25/2014        Assessment & Plan:   Viral Sinusitis:  Start your Claritin daily Add in Flonase BID x 3 days  then daily thereafter Stop Meloxicam x 3 days, start Ibuprofen 800 mg BID with meals Return precautions given  RTC as needed or if symptoms persist or worsen

## 2015-07-06 ENCOUNTER — Other Ambulatory Visit: Payer: Self-pay | Admitting: Internal Medicine

## 2015-07-19 ENCOUNTER — Telehealth: Payer: Self-pay

## 2015-07-19 NOTE — Telephone Encounter (Signed)
Pt left v/m; pt was seen 07/05/15 and pt was to cb to report how doing;  now pt is still having h/a's but CP is better. Pt was not able to get Flonase.

## 2015-07-19 NOTE — Telephone Encounter (Signed)
Mel,   Can you call her to see if she is better or not? Is she still having symptoms?

## 2015-07-19 NOTE — Telephone Encounter (Signed)
Still having HA and facial pain.  Using OTC Flonase and IBP which helps but as soon as it wears off, the headaches come back.  Feels like sinus/tension headaches and it occurs every day.

## 2015-07-20 NOTE — Telephone Encounter (Signed)
Spoke to pt and she is aware---pt advised to continue treatment and to let us know if Sx worsen

## 2015-07-20 NOTE — Telephone Encounter (Signed)
She should continue Flonase BID and take Aleve as needed for tension headaches. She should consider massage to help with tension in her neck and shoulders which could help relieve tension headaches.

## 2015-07-30 ENCOUNTER — Other Ambulatory Visit: Payer: Self-pay

## 2015-07-30 NOTE — Telephone Encounter (Signed)
I do not see where you have ever filled this medication--please advise if okay to refill

## 2015-07-31 MED ORDER — FLUOXETINE HCL 20 MG PO TABS: 40.0000 mg | ORAL_TABLET | Freq: Every day | ORAL | Status: DC

## 2015-07-31 NOTE — Telephone Encounter (Signed)
Ok to refill, sent electronically

## 2015-08-30 ENCOUNTER — Encounter: Payer: Self-pay | Admitting: Internal Medicine

## 2015-08-30 ENCOUNTER — Ambulatory Visit (INDEPENDENT_AMBULATORY_CARE_PROVIDER_SITE_OTHER): Payer: 59 | Admitting: Internal Medicine

## 2015-08-30 VITALS — BP 118/80 | HR 112 | Temp 98.0°F | Wt 259.0 lb

## 2015-08-30 DIAGNOSIS — J069 Acute upper respiratory infection, unspecified: Secondary | ICD-10-CM | POA: Diagnosis not present

## 2015-08-30 MED ORDER — METHYLPREDNISOLONE ACETATE PF 80 MG/ML IJ SUSP: 80.0000 mg | Freq: Once | INTRAMUSCULAR | Status: DC

## 2015-08-30 MED ORDER — ALBUTEROL SULFATE HFA 108 (90 BASE) MCG/ACT IN AERS: 2.0000 | INHALATION_SPRAY | Freq: Four times a day (QID) | RESPIRATORY_TRACT | Status: DC | PRN

## 2015-08-30 MED ORDER — HYDROCOD POLST-CPM POLST ER 10-8 MG/5ML PO SUER: 5.0000 mL | Freq: Every evening | ORAL | Status: DC | PRN

## 2015-08-30 MED ORDER — AZITHROMYCIN 250 MG PO TABS: ORAL_TABLET | ORAL | Status: DC

## 2015-08-30 MED ORDER — METHYLPREDNISOLONE ACETATE 80 MG/ML IJ SUSP
80.0000 mg | Freq: Once | INTRAMUSCULAR | Status: AC
  Administered 2015-08-30: 80 mg via INTRAMUSCULAR

## 2015-08-30 NOTE — Patient Instructions (Signed)
Upper Respiratory Infection, Adult Most upper respiratory infections (URIs) are a viral infection of the air passages leading to the lungs. A URI affects the nose, throat, and upper air passages. The most common type of URI is nasopharyngitis and is typically referred to as "the common cold." URIs run their course and usually go away on their own. Most of the time, a URI does not require medical attention, but sometimes a bacterial infection in the upper airways can follow a viral infection. This is called a secondary infection. Sinus and middle ear infections are common types of secondary upper respiratory infections. Bacterial pneumonia can also complicate a URI. A URI can worsen asthma and chronic obstructive pulmonary disease (COPD). Sometimes, these complications can require emergency medical care and may be life threatening.  CAUSES Almost all URIs are caused by viruses. A virus is a type of germ and can spread from one person to another.  RISKS FACTORS You may be at risk for a URI if:   You smoke.   You have chronic heart or lung disease.  You have a weakened defense (immune) system.   You are very young or very old.   You have nasal allergies or asthma.  You work in crowded or poorly ventilated areas.  You work in health care facilities or schools. SIGNS AND SYMPTOMS  Symptoms typically develop 2-3 days after you come in contact with a cold virus. Most viral URIs last 7-10 days. However, viral URIs from the influenza virus (flu virus) can last 14-18 days and are typically more severe. Symptoms may include:   Runny or stuffy (congested) nose.   Sneezing.   Cough.   Sore throat.   Headache.   Fatigue.   Fever.   Loss of appetite.   Pain in your forehead, behind your eyes, and over your cheekbones (sinus pain).  Muscle aches.  DIAGNOSIS  Your health care provider may diagnose a URI by:  Physical exam.  Tests to check that your symptoms are not due to  another condition such as:  Strep throat.  Sinusitis.  Pneumonia.  Asthma. TREATMENT  A URI goes away on its own with time. It cannot be cured with medicines, but medicines may be prescribed or recommended to relieve symptoms. Medicines may help:  Reduce your fever.  Reduce your cough.  Relieve nasal congestion. HOME CARE INSTRUCTIONS   Take medicines only as directed by your health care provider.   Gargle warm saltwater or take cough drops to comfort your throat as directed by your health care provider.  Use a warm mist humidifier or inhale steam from a shower to increase air moisture. This may make it easier to breathe.  Drink enough fluid to keep your urine clear or pale yellow.   Eat soups and other clear broths and maintain good nutrition.   Rest as needed.   Return to work when your temperature has returned to normal or as your health care provider advises. You may need to stay home longer to avoid infecting others. You can also use a face mask and careful hand washing to prevent spread of the virus.  Increase the usage of your inhaler if you have asthma.   Do not use any tobacco products, including cigarettes, chewing tobacco, or electronic cigarettes. If you need help quitting, ask your health care provider. PREVENTION  The best way to protect yourself from getting a cold is to practice good hygiene.   Avoid oral or hand contact with people with cold   symptoms.   Wash your hands often if contact occurs.  There is no clear evidence that vitamin C, vitamin E, echinacea, or exercise reduces the chance of developing a cold. However, it is always recommended to get plenty of rest, exercise, and practice good nutrition.  SEEK MEDICAL CARE IF:   You are getting worse rather than better.   Your symptoms are not controlled by medicine.   You have chills.  You have worsening shortness of breath.  You have brown or red mucus.  You have yellow or brown nasal  discharge.  You have pain in your face, especially when you bend forward.  You have a fever.  You have swollen neck glands.  You have pain while swallowing.  You have white areas in the back of your throat. SEEK IMMEDIATE MEDICAL CARE IF:   You have severe or persistent:  Headache.  Ear pain.  Sinus pain.  Chest pain.  You have chronic lung disease and any of the following:  Wheezing.  Prolonged cough.  Coughing up blood.  A change in your usual mucus.  You have a stiff neck.  You have changes in your:  Vision.  Hearing.  Thinking.  Mood. MAKE SURE YOU:   Understand these instructions.  Will watch your condition.  Will get help right away if you are not doing well or get worse.   This information is not intended to replace advice given to you by your health care provider. Make sure you discuss any questions you have with your health care provider.   Document Released: 11/26/2000 Document Revised: 10/17/2014 Document Reviewed: 09/07/2013 Elsevier Interactive Patient Education 2016 [employer]  

## 2015-08-30 NOTE — Progress Notes (Signed)
HPI  Pt presents to the clinic today with c/o sore throat, cough and chest congestion. This started 1 week ago. She is blowing clear mucous out of her nose.The cough is productive of green mucous. She denies difficulty swallowing. She has run fevers, had chills and body aches. She has has some mild shortness of breath. She has a history of seasonal allergies. She has had sick contacts. She does not get her flu shot.  Review of Systems      Past Medical History  Diagnosis Date  . DDD (degenerative disc disease), lumbar   . Depression   . Allergy     Family History  Problem Relation Age of Onset  . Hyperlipidemia Mother   . Heart disease Maternal Aunt   . Stroke Maternal Aunt   . Diabetes Maternal Aunt   . Heart disease Maternal Uncle   . Stroke Maternal Uncle   . Cancer Neg Hx     Social History   Social History  . Marital Status: Married    Spouse Name: N/A  . Number of Children: N/A  . Years of Education: N/A   Occupational History  . Not on file.   Social History Main Topics  . Smoking status: Never Smoker   . Smokeless tobacco: Not on file  . Alcohol Use: Yes     Comment: social  . Drug Use: No  . Sexual Activity: Yes   Other Topics Concern  . Not on file   Social History Narrative    Allergies  Allergen Reactions  . Gabapentin Other (See Comments)    Hair loss  . Penicillins Other (See Comments)    Childhood allergy     Constitutional: Positive headache, fatigue and fever. Denies abrupt weight changes.  HEENT:  Positive sore throat. Denies eye redness, eye pain, pressure behind the eyes, facial pain, nasal congestion, ear pain, ringing in the ears, wax buildup, runny nose or bloody nose. Respiratory: Positive cough. Denies difficulty breathing or shortness of breath.  Cardiovascular: Denies chest pain, chest tightness, palpitations or swelling in the hands or feet.   No other specific complaints in a complete review of systems (except as listed in  HPI above).  Objective:  BP 118/80 mmHg  Pulse 112  Temp(Src) 98 F (36.7 C) (Oral)  Wt 259 lb (117.482 kg)  SpO2 96%  Wt Readings from Last 3 Encounters:  07/05/15 259 lb (117.482 kg)  02/16/15 263 lb (119.296 kg)  12/21/14 259 lb (117.482 kg)     General: Appears her stated age, ill appearing in NAD. HEENT: Head: normal shape and size, frontal sinus tenderness noted; Eyes: sclera white, no icterus, conjunctiva pink; Ears: Tm's gray and intact, normal light reflex;  Throat/Mouth: + PND. Teeth present, mucosa pink and moist, no exudate noted, no lesions or ulcerations noted.  Neck: No cervical lymphadenopathy.  Cardiovascular: Tachycardic with normal rhythm. S1,S2 noted.  No murmur, rubs or gallops noted.  Pulmonary/Chest: Normal effort and positive vesicular breath sounds. No respiratory distress. No wheezes, rales or ronchi noted.      Assessment & Plan:   Upper Respiratory Infection:  Get some rest and drink plenty of water Do salt water gargles for the sore throat eRx for Azithromax x 5 days eRx for Tussionex cough syrup 80 mg Depo IM today  RTC as needed or if symptoms persist.

## 2015-08-30 NOTE — Progress Notes (Signed)
Pre visit review using our clinic review tool, if applicable. No additional management support is needed unless otherwise documented below in the visit note. 

## 2015-08-30 NOTE — Addendum Note (Signed)
Addended by: Pilar Grammes on: 08/30/2015 04:24 PM   Modules accepted: Orders

## 2015-09-03 ENCOUNTER — Telehealth: Payer: Self-pay | Admitting: Internal Medicine

## 2015-09-03 DIAGNOSIS — Z7689 Persons encountering health services in other specified circumstances: Secondary | ICD-10-CM

## 2015-09-03 NOTE — Telephone Encounter (Signed)
Patient called to find out if [name] received her FMLA paperwork.

## 2015-09-03 NOTE — Telephone Encounter (Signed)
Paperwork has been faxed 09/03/15 Left message letting pt know paperwork has been faxed and a copy of paperwork is here for her  Copy for pt Copy for file Copy for scan Copy for billing

## 2015-09-03 NOTE — Telephone Encounter (Signed)
Done, given back to Robin 

## 2015-09-03 NOTE — Telephone Encounter (Signed)
fmla paperwork  In [place] For review and signature  Pt wanted off from 08/27/15-3/17-17

## 2015-09-26 NOTE — Telephone Encounter (Signed)
Paperwork faxed to [employer]

## 2015-09-26 NOTE — Telephone Encounter (Signed)
[employer] faxed fmla paperwork to be reviewed with notes They would clarification on page 4 section C In [facility] IN BOX For review

## 2015-09-26 NOTE — Telephone Encounter (Signed)
Done, given back to Robin 

## 2015-09-27 ENCOUNTER — Other Ambulatory Visit: Payer: 59

## 2015-10-01 ENCOUNTER — Encounter: Payer: 59 | Admitting: Internal Medicine

## 2015-10-08 ENCOUNTER — Encounter: Payer: Self-pay | Admitting: Internal Medicine

## 2015-10-08 ENCOUNTER — Ambulatory Visit (INDEPENDENT_AMBULATORY_CARE_PROVIDER_SITE_OTHER): Payer: 59 | Admitting: Internal Medicine

## 2015-10-08 VITALS — BP 118/80 | HR 81 | Temp 97.8°F | Ht 66.0 in | Wt 260.0 lb

## 2015-10-08 DIAGNOSIS — Z0000 Encounter for general adult medical examination without abnormal findings: Secondary | ICD-10-CM

## 2015-10-08 NOTE — Patient Instructions (Signed)

## 2015-10-08 NOTE — Progress Notes (Signed)
Pre visit review using our clinic review tool, if applicable. No additional management support is needed unless otherwise documented below in the visit note. 

## 2015-10-08 NOTE — Progress Notes (Signed)
Subjective:    Patient ID: [name], female    DOB: 1966/05/02, 50 y.o.   MRN: [id number]  HPI  Pt presents to the clinic today for her annual exam.  Flu: 04/2015 Tetanus: 2008 Pap Smear: 201, hysterectomy, has left ovary Mammogram: 08/2014 Colonoscopy: 2010 Vision Screening: scheduled 10/2015 Dentist: yearly  Diet: She eats some lean meat. She eats fruits and veggies daily. She tries to avoid fried food. She drinks some soda, juice and water. Exercise: Walking a lot at work.  Review of Systems      Past Medical History  Diagnosis Date  . DDD (degenerative disc disease), lumbar   . Depression   . Allergy     Current Outpatient Prescriptions  Medication Sig Dispense Refill  . albuterol (PROVENTIL HFA;VENTOLIN HFA) 108 (90 Base) MCG/ACT inhaler Inhale 2 puffs into the lungs every 6 (six) hours as needed for wheezing or shortness of breath. 1 Inhaler 0  . cholecalciferol (VITAMIN D) 1000 units tablet Take 2,000 Units by mouth daily.    [name] FLUoxetine (PROZAC) 20 MG tablet Take 2 tablets (40 mg total) by mouth daily before breakfast. 180 tablet 0  . HYDROcodone-acetaminophen (NORCO) 7.5-325 MG tablet Take 1 tablet by mouth every 6 (six) hours as needed.    . hydrOXYzine (ATARAX/VISTARIL) 10 MG tablet Take 1 tablet by mouth at bedtime.     [name] ibuprofen (ADVIL,MOTRIN) 800 MG tablet TAKE 1 TABLET BY MOUTH 3 TIMES A DAY 180 tablet 0  . meloxicam (MOBIC) 7.5 MG tablet Take 7.5 mg by mouth 2 (two) times daily.    . ranitidine (ZANTAC) 150 MG tablet Take 150 mg by mouth 2 (two) times daily as needed for heartburn.     No current facility-administered medications for this visit.    Allergies  Allergen Reactions  . Gabapentin Other (See Comments)    Hair loss  . Penicillins Other (See Comments)    Childhood allergy    Family History  Problem Relation Age of Onset  . Hyperlipidemia Mother   . Heart disease Maternal Aunt   . Stroke Maternal Aunt   . Diabetes Maternal  Aunt   . Heart disease Maternal Uncle   . Stroke Maternal Uncle   . Cancer Neg Hx     Social History   Social History  . Marital Status: Married    Spouse Name: N/A  . Number of Children: N/A  . Years of Education: N/A   Occupational History  . Not on file.   Social History Main Topics  . Smoking status: Never Smoker   . Smokeless tobacco: Not on file  . Alcohol Use: Yes     Comment: social  . Drug Use: No  . Sexual Activity: Yes   Other Topics Concern  . Not on file   Social History Narrative     Constitutional: Pt reports weight gain. Denies fever, malaise, fatigue, headache.  HEENT: Denies eye pain, eye redness, ear pain, ringing in the ears, wax buildup, runny nose, nasal congestion, bloody nose, or sore throat. Respiratory: Denies difficulty breathing, shortness of breath, cough or sputum production.   Cardiovascular: Denies chest pain, chest tightness, palpitations or swelling in the hands or feet.  Gastrointestinal: Pt reports reflux. Denies abdominal pain, bloating, constipation, diarrhea or blood in the stool.  GU: Denies urgency, frequency, pain with urination, burning sensation, blood in urine, odor or discharge. Musculoskeletal: Pt reports back pain and chest wall pain. Denies decrease in range of motion, difficulty with  gait, or joint pain and swelling.  Skin: Denies redness, rashes, lesions or ulcercations.  Neurological: Denies dizziness, difficulty with memory, difficulty with speech or problems with balance and coordination.  Psych: Denies anxiety, depression, SI/HI.  No other specific complaints in a complete review of systems (except as listed in HPI above).  Objective:   Physical Exam   BP 118/80 mmHg  Pulse 81  Temp(Src) 97.8 F (36.6 C) (Oral)  Ht 5\' 6"  (1.676 m)  Wt 260 lb (117.935 kg)  BMI 41.99 kg/m2  SpO2 97% Wt Readings from Last 3 Encounters:  10/08/15 260 lb (117.935 kg)  08/30/15 259 lb (117.482 kg)  07/05/15 259 lb (117.482  kg)    General: Appears her stated age, obese in NAD. Skin: Warm, dry and intact.  HEENT: Head: normal shape and size; Eyes: sclera white, no icterus, conjunctiva pink, PERRLA and EOMs intact; Ears: Tm's gray and intact, normal light reflex; Throat/Mouth: Teeth present, mucosa pink and moist, no exudate, lesions or ulcerations noted.  Neck:  Neck supple, trachea midline. No masses, lumps or thyromegaly present.  Cardiovascular: Normal rate and rhythm. S1,S2 noted.  No murmur, rubs or gallops noted. Trace BLE edema. No carotid bruits noted. Pulmonary/Chest: Normal effort and positive vesicular breath sounds. No respiratory distress. No wheezes, rales or ronchi noted.  Abdomen: Soft and nontender. Normal bowel sounds. No distention or masses noted. Liver, spleen and kidneys non palpable. Musculoskeletal: Normal flexion and extension of the spine. Decreased rotation. Bony tenderness noted over the lumbar spine. Strength 5/5 BUE/BLE. Chest pain reproduced with palpation of the left chest wall.No difficulty with gait.  Neurological: Alert and oriented. Cranial nerves II-XII grossly intact. Coordination normal.  Psychiatric: Mood and affect normal. Behavior is normal. Judgment and thought content normal.     BMET    Component Value Date/Time   NA 141 08/03/2014 1609   K 4.1 08/03/2014 1609   CL 104 08/03/2014 1609   CO2 22 08/03/2014 1609   GLUCOSE 96 08/03/2014 1609   BUN 10 08/03/2014 1609   CREATININE 0.71 08/03/2014 1609   CALCIUM 9.6 08/03/2014 1609   GFRNONAA 101 08/03/2014 1609   GFRAA 116 08/03/2014 1609    Lipid Panel     Component Value Date/Time   CHOL 229* 12/25/2014   CHOL 204* 08/03/2014 1609   TRIG 102 08/03/2014 1609   HDL 56 08/03/2014 1609   CHOLHDL 3.6 08/03/2014 1609   LDLCALC 128* 08/03/2014 1609    CBC    Component Value Date/Time   WBC 5.8 08/03/2014 1609   RBC 4.02 08/03/2014 1609   HGB 13.0 08/03/2014 1609   HCT 37.8 08/03/2014 1609   PLT 238  08/03/2014 1609   MCV 94 08/03/2014 1609   MCH 32.3 08/03/2014 1609   MCHC 34.4 08/03/2014 1609   RDW 13.7 08/03/2014 1609   LYMPHSABS 2.8 08/03/2014 1609   EOSABS 0.1 08/03/2014 1609   BASOSABS 0.0 08/03/2014 1609    Hgb A1C Lab Results  Component Value Date   HGBA1C 5.8 12/25/2014        Assessment & Plan:   Preventative Health Maintenance:  Flu and Tetanus UTD She will cal [facility] to schedule her mammogram Pelvic exam due 2019 Colonoscopy due next year Encouraged her to consume a balanced diet, eat six small meals per day, and start an exercise regimen Advised her to continue to see an eye doctor and dentist at least annually Will check CBC, CMET, Lipid and A1C today  RTC  in 6 months, sooner if needed

## 2015-10-09 LAB — COMPREHENSIVE METABOLIC PANEL
ALT: 13 U/L (ref 0–35)
AST: 15 U/L (ref 0–37)
Albumin: 4.1 g/dL (ref 3.5–5.2)
Alkaline Phosphatase: 54 U/L (ref 39–117)
BUN: 10 mg/dL (ref 6–23)
CO2: 26 mEq/L (ref 19–32)
Calcium: 9.6 mg/dL (ref 8.4–10.5)
Chloride: 106 mEq/L (ref 96–112)
Creatinine, Ser: 0.66 mg/dL (ref 0.40–1.20)
GFR: 122.12 mL/min (ref >60.00)
Glucose, Bld: 93 mg/dL (ref 70–99)
Potassium: 4 mEq/L (ref 3.5–5.1)
Sodium: 141 mEq/L (ref 135–145)
Total Bilirubin: 0.4 mg/dL (ref 0.2–1.2)
Total Protein: 7.7 g/dL (ref 6.0–8.3)

## 2015-10-09 LAB — LIPID PANEL
Cholesterol: 217 mg/dL — ABNORMAL HIGH (ref 0–200)
HDL: 57.5 mg/dL (ref >39.00)
LDL Cholesterol: 146 mg/dL — ABNORMAL HIGH (ref 0–99)
NonHDL: 159.57
Total CHOL/HDL Ratio: 4
Triglycerides: 70 mg/dL (ref 0.0–149.0)
VLDL: 14 mg/dL (ref 0.0–40.0)

## 2015-10-09 LAB — CBC
HCT: 39 % (ref 36.0–46.0)
Hemoglobin: 13.1 g/dL (ref 12.0–15.0)
MCHC: 33.6 g/dL (ref 30.0–36.0)
MCV: 95.9 fl (ref 78.0–100.0)
Platelets: 242 10*3/uL (ref 150.0–400.0)
RBC: 4.07 Mil/uL (ref 3.87–5.11)
RDW: 13.2 % (ref 11.5–15.5)
WBC: 6.3 10*3/uL (ref 4.0–10.5)

## 2015-10-09 LAB — HEMOGLOBIN A1C: Hgb A1c MFr Bld: 6 % (ref 4.6–6.5)

## 2015-10-10 ENCOUNTER — Telehealth: Payer: Self-pay

## 2015-10-10 NOTE — Telephone Encounter (Signed)
It could last a few days, she should keep her legs elevated when not working. She can also wear compression hose during the day and off at night to help.

## 2015-10-10 NOTE — Telephone Encounter (Signed)
Called pt in reference to her lab results---pt states she did spend a lot of time on her feet last night and her ankles are swollen---the swelling has only gone down a little bit today---pt wants to know how long she should expect the swelling to last---please advise

## 2015-10-11 NOTE — Telephone Encounter (Signed)
Pt is aware as instructed 

## 2015-10-16 ENCOUNTER — Encounter: Payer: Self-pay | Admitting: Internal Medicine

## 2015-10-16 ENCOUNTER — Ambulatory Visit (INDEPENDENT_AMBULATORY_CARE_PROVIDER_SITE_OTHER): Payer: 59 | Admitting: Internal Medicine

## 2015-10-16 VITALS — BP 120/76 | HR 104 | Temp 97.8°F | Wt 257.2 lb

## 2015-10-16 DIAGNOSIS — J069 Acute upper respiratory infection, unspecified: Secondary | ICD-10-CM

## 2015-10-16 DIAGNOSIS — B9789 Other viral agents as the cause of diseases classified elsewhere: Principal | ICD-10-CM

## 2015-10-16 MED ORDER — BENZONATATE 200 MG PO CAPS: 200.0000 mg | ORAL_CAPSULE | Freq: Two times a day (BID) | ORAL | Status: DC | PRN

## 2015-10-16 NOTE — Progress Notes (Signed)
Pre visit review using our clinic review tool, if applicable. No additional management support is needed unless otherwise documented below in the visit note. 

## 2015-10-16 NOTE — Progress Notes (Signed)
HPI  Pt presents to the clinic today with c/o cough, sore throat, nasal congestion, and fever. This started 5 days ago. Pt states that her cough is non-productive and worse during the day. She reports she hears a  "crackling" in her chest. She reports fever, but denies chills and body aches. She has been using Theraflu, Tylenol and her Aalbuterol inhaler with some relief. She has also been taking Zyrtec and Claritin for allergies.  Pt was last seen 08/30/2015 for URI and prescribed Azithromycin. She has not had sick contacts.  Review of Systems      Past Medical History  Diagnosis Date  . DDD (degenerative disc disease), lumbar   . Depression   . Allergy     Family History  Problem Relation Age of Onset  . Hyperlipidemia Mother   . Heart disease Maternal Aunt   . Stroke Maternal Aunt   . Diabetes Maternal Aunt   . Heart disease Maternal Uncle   . Stroke Maternal Uncle   . Cancer Neg Hx     Social History   Social History  . Marital Status: Married    Spouse Name: N/A  . Number of Children: N/A  . Years of Education: N/A   Occupational History  . Not on file.   Social History Main Topics  . Smoking status: Never Smoker   . Smokeless tobacco: Not on file  . Alcohol Use: Yes     Comment: social  . Drug Use: No  . Sexual Activity: Yes   Other Topics Concern  . Not on file   Social History Narrative    Allergies  Allergen Reactions  . Gabapentin Other (See Comments)    Hair loss  . Penicillins Other (See Comments)    Childhood allergy     Constitutional: Positive fatigue and fever. Denies headahce or abrupt weight changes.  HEENT:  Positive sore throat. Denies eye redness, eye pain, pressure behind the eyes, facial pain, nasal congestion, ear pain, ringing in the ears, wax buildup, runny nose or bloody nose. Respiratory: Positive cough. Denies difficulty breathing or shortness of breath.  Cardiovascular: Denies chest pain, chest tightness, palpitations or  swelling in the hands or feet.   No other specific complaints in a complete review of systems (except as listed in HPI above).  Objective:   BP 120/76 mmHg  Pulse 104  Temp(Src) 97.8 F (36.6 C) (Oral)  Wt 257 lb 4 oz (116.688 kg)  SpO2 97% Wt Readings from Last 3 Encounters:  10/16/15 257 lb 4 oz (116.688 kg)  10/08/15 260 lb (117.935 kg)  08/30/15 259 lb (117.482 kg)     General: Appears her stated age, obese and in NAD. HEENT: Head: normal shape and size, no sinus tenderness noted; Eyes: sclera white, no icterus, conjunctiva pink; Ears: Tm's gray and intact, normal light reflex; Nose: mucosa pink and moist, erythematous and inflamed, septum midline; Throat/Mouth: + PND. Teeth present, mucosa pink and moist, no exudate noted, no lesions or ulcerations noted.  Neck: Cervical lymphadenopathy.  Cardiovascular: Normal rate and rhythm. S1,S2 noted.  No murmur, rubs or gallops noted.  Pulmonary/Chest: Normal effort and positive vesicular breath sounds. No respiratory distress. No wheezes, rales or ronchi noted.      Assessment & Plan:   Viral Upper Respiratory Infection with Cough:  Get some rest and drink plenty of water Continue taking Claritin Start taking Flonase eRx for Tessalon pearls prn for cough   RTC as needed or if symptoms persist.

## 2015-10-16 NOTE — Patient Instructions (Signed)
Upper Respiratory Infection, Adult Most upper respiratory infections (URIs) are a viral infection of the air passages leading to the lungs. A URI affects the nose, throat, and upper air passages. The most common type of URI is nasopharyngitis and is typically referred to as "the common cold." URIs run their course and usually go away on their own. Most of the time, a URI does not require medical attention, but sometimes a bacterial infection in the upper airways can follow a viral infection. This is called a secondary infection. Sinus and middle ear infections are common types of secondary upper respiratory infections. Bacterial pneumonia can also complicate a URI. A URI can worsen asthma and chronic obstructive pulmonary disease (COPD). Sometimes, these complications can require emergency medical care and may be life threatening.  CAUSES Almost all URIs are caused by viruses. A virus is a type of germ and can spread from one person to another.  RISKS FACTORS You may be at risk for a URI if:   You smoke.   You have chronic heart or lung disease.  You have a weakened defense (immune) system.   You are very young or very old.   You have nasal allergies or asthma.  You work in crowded or poorly ventilated areas.  You work in health care facilities or schools. SIGNS AND SYMPTOMS  Symptoms typically develop 2-3 days after you come in contact with a cold virus. Most viral URIs last 7-10 days. However, viral URIs from the influenza virus (flu virus) can last 14-18 days and are typically more severe. Symptoms may include:   Runny or stuffy (congested) nose.   Sneezing.   Cough.   Sore throat.   Headache.   Fatigue.   Fever.   Loss of appetite.   Pain in your forehead, behind your eyes, and over your cheekbones (sinus pain).  Muscle aches.  DIAGNOSIS  Your health care provider may diagnose a URI by:  Physical exam.  Tests to check that your symptoms are not due to  another condition such as:  Strep throat.  Sinusitis.  Pneumonia.  Asthma. TREATMENT  A URI goes away on its own with time. It cannot be cured with medicines, but medicines may be prescribed or recommended to relieve symptoms. Medicines may help:  Reduce your fever.  Reduce your cough.  Relieve nasal congestion. HOME CARE INSTRUCTIONS   Take medicines only as directed by your health care provider.   Gargle warm saltwater or take cough drops to comfort your throat as directed by your health care provider.  Use a warm mist humidifier or inhale steam from a shower to increase air moisture. This may make it easier to breathe.  Drink enough fluid to keep your urine clear or pale yellow.   Eat soups and other clear broths and maintain good nutrition.   Rest as needed.   Return to work when your temperature has returned to normal or as your health care provider advises. You may need to stay home longer to avoid infecting others. You can also use a face mask and careful hand washing to prevent spread of the virus.  Increase the usage of your inhaler if you have asthma.   Do not use any tobacco products, including cigarettes, chewing tobacco, or electronic cigarettes. If you need help quitting, ask your health care provider. PREVENTION  The best way to protect yourself from getting a cold is to practice good hygiene.   Avoid oral or hand contact with people with cold   symptoms.   Wash your hands often if contact occurs.  There is no clear evidence that vitamin C, vitamin E, echinacea, or exercise reduces the chance of developing a cold. However, it is always recommended to get plenty of rest, exercise, and practice good nutrition.  SEEK MEDICAL CARE IF:   You are getting worse rather than better.   Your symptoms are not controlled by medicine.   You have chills.  You have worsening shortness of breath.  You have brown or red mucus.  You have yellow or brown nasal  discharge.  You have pain in your face, especially when you bend forward.  You have a fever.  You have swollen neck glands.  You have pain while swallowing.  You have white areas in the back of your throat. SEEK IMMEDIATE MEDICAL CARE IF:   You have severe or persistent:  Headache.  Ear pain.  Sinus pain.  Chest pain.  You have chronic lung disease and any of the following:  Wheezing.  Prolonged cough.  Coughing up blood.  A change in your usual mucus.  You have a stiff neck.  You have changes in your:  Vision.  Hearing.  Thinking.  Mood. MAKE SURE YOU:   Understand these instructions.  Will watch your condition.  Will get help right away if you are not doing well or get worse.   This information is not intended to replace advice given to you by your health care provider. Make sure you discuss any questions you have with your health care provider.   Document Released: 11/26/2000 Document Revised: 10/17/2014 Document Reviewed: 09/07/2013 Elsevier Interactive Patient Education 2016 [employer]  

## 2015-10-19 ENCOUNTER — Other Ambulatory Visit: Payer: Self-pay | Admitting: Internal Medicine

## 2015-10-19 ENCOUNTER — Telehealth: Payer: Self-pay

## 2015-10-19 MED ORDER — CEFUROXIME AXETIL 500 MG PO TABS: 500.0000 mg | ORAL_TABLET | Freq: Two times a day (BID) | ORAL | Status: DC

## 2015-10-19 NOTE — Telephone Encounter (Signed)
Will call abx into pharmacy

## 2015-10-19 NOTE — Telephone Encounter (Signed)
Pt left v/m; pt was seen 10/16/15; pt continues with a lot of head and chest congestion and prod cough; no fever of chills now; pt taking zyrtec and flonase.pt thinks she was to cb today with update. Pt request cb.

## 2015-11-02 ENCOUNTER — Other Ambulatory Visit: Payer: Self-pay | Admitting: Internal Medicine

## 2015-11-05 ENCOUNTER — Encounter (INDEPENDENT_AMBULATORY_CARE_PROVIDER_SITE_OTHER): Payer: Self-pay

## 2015-11-06 ENCOUNTER — Telehealth: Payer: Self-pay | Admitting: Internal Medicine

## 2015-11-06 NOTE — Telephone Encounter (Signed)
PT dropped off DMV disability placard renewal form to be filled out. It is needed because work parking lot is very large and because of back problems it is a hardship and placard helps alleviate. I placed in Rx tower. Please call [phone] when completed.

## 2015-11-07 NOTE — Telephone Encounter (Signed)
Done, placed in MYD box for completion

## 2015-11-08 NOTE — Telephone Encounter (Signed)
Form placed in front office for pickup and pt is aware.  

## 2015-11-17 ENCOUNTER — Other Ambulatory Visit: Payer: Self-pay | Admitting: Internal Medicine

## 2015-11-22 ENCOUNTER — Other Ambulatory Visit: Payer: Self-pay

## 2015-11-22 MED ORDER — FLUOXETINE HCL 20 MG PO TABS: 40.0000 mg | ORAL_TABLET | Freq: Every day | ORAL | Status: DC

## 2015-11-22 MED ORDER — IBUPROFEN 800 MG PO TABS: 800.0000 mg | ORAL_TABLET | Freq: Three times a day (TID) | ORAL | Status: DC

## 2015-11-30 MED ORDER — IBUPROFEN 800 MG PO TABS: 800.0000 mg | ORAL_TABLET | Freq: Three times a day (TID) | ORAL | Status: DC

## 2015-11-30 NOTE — Addendum Note (Signed)
Addended by: Lurlean Nanny on: 11/30/2015 04:09 PM   Modules accepted: Orders

## 2015-12-25 ENCOUNTER — Other Ambulatory Visit: Payer: Self-pay | Admitting: Internal Medicine

## 2015-12-25 DIAGNOSIS — Z1231 Encounter for screening mammogram for malignant neoplasm of breast: Secondary | ICD-10-CM

## 2016-01-08 ENCOUNTER — Encounter: Payer: 59 | Admitting: Obstetrics and Gynecology

## 2016-01-08 ENCOUNTER — Ambulatory Visit: Payer: 59

## 2016-01-14 ENCOUNTER — Ambulatory Visit
Admission: RE | Admit: 2016-01-14 | Discharge: 2016-01-14 | Disposition: A | Discharge status: Home / Self Care | Payer: 59 | Source: Ambulatory Visit | Attending: Internal Medicine | Admitting: Internal Medicine

## 2016-01-14 DIAGNOSIS — Z1231 Encounter for screening mammogram for malignant neoplasm of breast: Secondary | ICD-10-CM

## 2016-01-23 ENCOUNTER — Encounter: Payer: 59 | Admitting: Family Medicine

## 2016-01-23 DIAGNOSIS — Z01419 Encounter for gynecological examination (general) (routine) without abnormal findings: Secondary | ICD-10-CM

## 2016-01-31 LAB — HEMOGLOBIN A1C: Hemoglobin A1C: 6

## 2016-01-31 LAB — LIPID PANEL
Cholesterol: 206 mg/dL — AB (ref 0–200)
HDL: 62 mg/dL (ref 35–70)
LDL Cholesterol: 128 mg/dL
Triglycerides: 79 mg/dL (ref 40–160)

## 2016-02-10 ENCOUNTER — Other Ambulatory Visit: Payer: Self-pay | Admitting: Internal Medicine

## 2016-03-06 ENCOUNTER — Telehealth: Payer: Self-pay

## 2016-03-06 NOTE — Telephone Encounter (Signed)
Health screening form done, attached to [facility] and placed on [facility] desk

## 2016-03-06 NOTE — Telephone Encounter (Signed)
Received fax from pt requesting FMLA be renewed as it has expired for intermittent leave also to complete health screening form---attached recent labs from employer health screening

## 2016-03-07 DIAGNOSIS — Z7689 Persons encountering health services in other specified circumstances: Secondary | ICD-10-CM

## 2016-03-07 NOTE — Telephone Encounter (Signed)
fmla paperwork in [facility] IN BOX °For review and signature °

## 2016-03-07 NOTE — Telephone Encounter (Signed)
Done, on MYD desk to call pt and let her know both forms are ready.

## 2016-03-10 ENCOUNTER — Encounter: Payer: Self-pay | Admitting: Internal Medicine

## 2016-03-10 NOTE — Telephone Encounter (Signed)
Left detailed msg on VM per HIPAA letting pt know FMLA paperwork has been faxed via [facility] and her wellness form is ready for pick up

## 2016-03-11 ENCOUNTER — Encounter (HOSPITAL_COMMUNITY): Payer: Self-pay | Admitting: Emergency Medicine

## 2016-03-11 ENCOUNTER — Emergency Department (HOSPITAL_COMMUNITY)
Admission: EM | Admit: 2016-03-11 | Discharge: 2016-03-11 | Disposition: A | Discharge status: Home / Self Care | Payer: 59 | Attending: Emergency Medicine | Admitting: Emergency Medicine

## 2016-03-11 ENCOUNTER — Telehealth: Payer: Self-pay | Admitting: Internal Medicine

## 2016-03-11 ENCOUNTER — Emergency Department (HOSPITAL_COMMUNITY): Payer: 59

## 2016-03-11 DIAGNOSIS — Z791 Long term (current) use of non-steroidal anti-inflammatories (NSAID): Secondary | ICD-10-CM | POA: Insufficient documentation

## 2016-03-11 DIAGNOSIS — M542 Cervicalgia: Secondary | ICD-10-CM | POA: Insufficient documentation

## 2016-03-11 DIAGNOSIS — M25511 Pain in right shoulder: Secondary | ICD-10-CM | POA: Insufficient documentation

## 2016-03-11 DIAGNOSIS — R0789 Other chest pain: Secondary | ICD-10-CM | POA: Insufficient documentation

## 2016-03-11 DIAGNOSIS — R079 Chest pain, unspecified: Secondary | ICD-10-CM | POA: Diagnosis present

## 2016-03-11 DIAGNOSIS — Z79899 Other long term (current) drug therapy: Secondary | ICD-10-CM | POA: Insufficient documentation

## 2016-03-11 LAB — I-STAT TROPONIN, ED
Troponin i, poc: 0 ng/mL (ref 0.00–0.08)
Troponin i, poc: 0 ng/mL (ref 0.00–0.08)

## 2016-03-11 LAB — CBC
HCT: 39.3 % (ref 36.0–46.0)
Hemoglobin: 12.9 g/dL (ref 12.0–15.0)
MCH: 31.5 pg (ref 26.0–34.0)
MCHC: 32.8 g/dL (ref 30.0–36.0)
MCV: 95.9 fL (ref 78.0–100.0)
Platelets: 259 10*3/uL (ref 150–400)
RBC: 4.1 MIL/uL (ref 3.87–5.11)
RDW: 13.4 % (ref 11.5–15.5)
WBC: 6.7 10*3/uL (ref 4.0–10.5)

## 2016-03-11 LAB — BASIC METABOLIC PANEL
Anion gap: 6 (ref 5–15)
BUN: 12 mg/dL (ref 6–20)
CO2: 24 mmol/L (ref 22–32)
Calcium: 9.1 mg/dL (ref 8.9–10.3)
Chloride: 109 mmol/L (ref 101–111)
Creatinine, Ser: 0.6 mg/dL (ref 0.44–1.00)
GFR calc Af Amer: >60 mL/min (ref >60)
GFR calc non Af Amer: >60 mL/min (ref >60)
Glucose, Bld: 97 mg/dL (ref 65–99)
Potassium: 3.6 mmol/L (ref 3.5–5.1)
Sodium: 139 mmol/L (ref 135–145)

## 2016-03-11 LAB — D-DIMER, QUANTITATIVE: D-Dimer, Quant: <0.27 ug/mL-FEU (ref 0.00–0.50)

## 2016-03-11 MED ORDER — NAPROXEN 500 MG PO TABS: 500.0000 mg | ORAL_TABLET | Freq: Two times a day (BID) | ORAL | 0 refills | Status: DC

## 2016-03-11 MED ORDER — KETOROLAC TROMETHAMINE 30 MG/ML IJ SOLN
30.0000 mg | Freq: Once | INTRAMUSCULAR | Status: AC
  Administered 2016-03-11: 30 mg via INTRAVENOUS
  Filled 2016-03-11: qty 1

## 2016-03-11 NOTE — ED Triage Notes (Signed)
Patient here from home with complaints of centralized chest pain that started this morning. Reports pain feeling "like indigestion". Dizziness, right arm pain, and right jaw pain. Denies nausea/vomiting.

## 2016-03-11 NOTE — ED Notes (Signed)
Patient was alert, oriented and stable upon discharge. RN went over AVS and patient had no further questions.  

## 2016-03-11 NOTE — ED Notes (Signed)
I-stat troponin to be drawn at 17:00 per Dr. Jeneen Rinks

## 2016-03-11 NOTE — Telephone Encounter (Signed)
[facility] Patient Name: [name] DOB: 1966-02-09 Initial Comment feeling very tired, dizziness, pains in upper breast bone area. thinks it might be her vit. d level is low. Nurse Assessment Nurse: Dimas Chyle, RN, Dellis Filbert Date/Time Eilene Ghazi Time): 03/11/2016 11:40:00 AM Confirm and document reason for call. If symptomatic, describe symptoms. You must click the next button to save text entered. ---Feeling very tired, dizziness, pains in upper breast bone area. Thinks it might be her vitamin D level is low. No fever. Has the patient traveled out of the country within the last 30 days? ---No Does the patient have any new or worsening symptoms? ---Yes Will a triage be completed? ---Yes Related visit to physician within the last 2 weeks? ---No Does the PT have any chronic conditions? (i.e. diabetes, asthma, etc.) ---No Is the patient pregnant or possibly pregnant? (Ask all females between the ages of 68-55) ---No Is this a behavioral health or substance abuse call? ---No Guidelines Guideline Title Affirmed Question Affirmed Notes Chest Pain Dizziness or lightheadedness Final Disposition User Go to ED Now Dimas Chyle, RN, [place] - ED Disagree/Comply: Comply

## 2016-03-11 NOTE — ED Provider Notes (Signed)
[facility] Provider Note   CSN: [id number] Arrival date & time: 03/11/16  1231     History   Chief Complaint Chief Complaint  Patient presents with  . Chest Pain  . Numbness  . Jaw Pain    HPI [name] is a 50 y.o. female.  She presents for evaluation of right-sided chest pain.  She states that she awakened this morning and while she was getting up and around she felt some pain in the right side of her chest immediately adjacent her sternum. It hurts to touch and move. She states she thought it might be indigestion but does not describe any burning. No abdominal pain. No nausea. She states that it felt range and the right side of her neck up to her ear and even moving her shoulder and some pain in the posterior aspect of her shoulder as well. No left-sided symptoms. No shortness of breath.  No history of cardiac disease. No history of hypertension diabetes hypercholesterolemia. She is a nonsmoker. No recent prolonged immobilization cast splints fractures hormone use or strong family history of DVT or PE. No leg swelling or pain.  No recent cough and URI symptoms fevers chills or other complaints.   HPI  Past Medical History:  Diagnosis Date  . Allergy   . DDD (degenerative disc disease), lumbar   . Depression     Patient Active Problem List   Diagnosis Date Noted  . Depression 08/03/2014  . DDD (degenerative disc disease), lumbar 08/03/2014    Past Surgical History:  Procedure Laterality Date  . ABDOMINAL HYSTERECTOMY    . OOPHORECTOMY    . TUBAL LIGATION      OB History    No data available       Home Medications    Prior to Admission medications   Medication Sig Start Date End Date Taking? Authorizing Provider  FLUoxetine (PROZAC) 20 MG tablet TAKE 2 TABLETS BY MOUTH  DAILY BEFORE BREAKFAST. Patient taking differently: TAKE 1-2 TABLETS BY MOUTH  DAILY BEFORE BREAKFAST. 02/12/16  Yes Jearld Fenton, NP  HYDROcodone-acetaminophen  (NORCO) 7.5-325 MG tablet Take 1 tablet by mouth every 6 (six) hours as needed for moderate pain.  09/12/15  Yes Historical Provider, MD  hydrOXYzine (ATARAX/VISTARIL) 10 MG tablet Take 10 mg by mouth at bedtime.  09/12/15  Yes Historical Provider, MD  ibuprofen (ADVIL,MOTRIN) 800 MG tablet Take 1 tablet (800 mg total) by mouth 3 (three) times daily. Patient taking differently: Take 800 mg by mouth every 8 (eight) hours as needed for moderate pain.  11/30/15  Yes Lucille Passy, MD  loratadine (CLARITIN) 10 MG tablet Take 10 mg by mouth daily.   Yes Historical Provider, MD  meloxicam (MOBIC) 7.5 MG tablet Take 7.5 mg by mouth 2 (two) times daily as needed for pain.    Yes Historical Provider, MD  albuterol (PROVENTIL HFA;VENTOLIN HFA) 108 (90 Base) MCG/ACT inhaler Inhale 2 puffs into the lungs every 6 (six) hours as needed for wheezing or shortness of breath. 08/30/15   Jearld Fenton, NP  cefUROXime (CEFTIN) 500 MG tablet Take 1 tablet (500 mg total) by mouth 2 (two) times daily with a meal. 10/19/15   Jearld Fenton, NP  methocarbamol (ROBAXIN) 500 MG tablet Take 1 tablet (500 mg total) by mouth every 8 (eight) hours as needed for muscle spasms. 03/13/16   Jearld Fenton, NP  naproxen (NAPROSYN) 500 MG tablet Take 1 tablet (500 mg total) by mouth  2 (two) times daily. 03/11/16   Tanna Furry, MD    Family History Family History  Problem Relation Age of Onset  . Hyperlipidemia Mother   . Heart disease Maternal Aunt   . Stroke Maternal Aunt   . Diabetes Maternal Aunt   . Heart disease Maternal Uncle   . Stroke Maternal Uncle   . Cancer Neg Hx     Social History Social History  Substance Use Topics  . Smoking status: Never Smoker  . Smokeless tobacco: Never Used  . Alcohol use Yes     Comment: social     Allergies   Orange concentrate [flavoring agent]; Gabapentin; and Penicillins   Review of Systems Review of Systems  Constitutional: Negative for appetite change, chills, diaphoresis,  fatigue and fever.  HENT: Negative for mouth sores, sore throat and trouble swallowing.   Eyes: Negative for visual disturbance.  Respiratory: Negative for cough, chest tightness, shortness of breath and wheezing.   Cardiovascular: Positive for chest pain.  Gastrointestinal: Negative for abdominal distention, abdominal pain, diarrhea, nausea and vomiting.  Endocrine: Negative for polydipsia, polyphagia and polyuria.  Genitourinary: Negative for dysuria, frequency and hematuria.  Musculoskeletal: Positive for arthralgias. Negative for gait problem.       Shoulder and neck pain  Skin: Negative for color change, pallor and rash.  Neurological: Negative for dizziness, syncope, light-headedness and headaches.  Hematological: Does not bruise/bleed easily.  Psychiatric/Behavioral: Negative for behavioral problems and confusion.     Physical Exam Updated Vital Signs BP 117/77 (BP Location: Left Arm)   Pulse 79   Temp 97.6 F (36.4 C) (Oral)   Resp 18   SpO2 95%   Physical Exam  Constitutional: She is oriented to person, place, and time. She appears well-developed and well-nourished. No distress.  HENT:  Head: Normocephalic.  Eyes: Conjunctivae are normal. Pupils are equal, round, and reactive to light. No scleral icterus.  Neck: Normal range of motion. Neck supple. No thyromegaly present.  Cardiovascular: Normal rate and regular rhythm.  Exam reveals no gallop and no friction rub.   No murmur heard. Pulmonary/Chest: Effort normal and breath sounds normal. No respiratory distress. She has no wheezes. She has no rales.  Clear bilateral breath sounds. Tender across her right parasternal chest.  Abdominal: Soft. Bowel sounds are normal. She exhibits no distension. There is no tenderness. There is no rebound.  Musculoskeletal: Normal range of motion.  Neurological: She is alert and oriented to person, place, and time.  Skin: Skin is warm and dry. No rash noted.  Psychiatric: She has a  normal mood and affect. Her behavior is normal.     ED Treatments / Results  Labs (all labs ordered are listed, but only abnormal results are displayed) Labs Reviewed  BASIC METABOLIC PANEL  CBC  D-DIMER, QUANTITATIVE (NOT AT [facility])  Randolm Idol, ED  Randolm Idol, ED    EKG  EKG Interpretation  Date/Time:  Tuesday March 11 2016 12:41:02 EDT Ventricular Rate:  93 PR Interval:    QRS Duration: 68 QT Interval:  365 QTC Calculation: [id number] R Axis:   2 Text Interpretation:  Sinus rhythm Confirmed by Jeneen Rinks  MD, Mason ([id number]) on 03/11/2016 1:37:33 PM       Radiology No results found.  Procedures Procedures (including critical care time)  Medications Ordered in ED Medications  ketorolac (TORADOL) 30 MG/ML injection 30 mg (30 mg Intravenous Given 03/11/16 1405)     Initial Impression / Assessment and Plan / ED Course  I  have reviewed the triage vital signs and the nursing notes.  Pertinent labs & imaging results that were available during my care of the patient were reviewed by me and considered in my medical decision making (see chart for details).  Clinical Course    EKG shows no acute or ischemic changes. Troponin normal. D-dimer negative. Await second enzyme. Given Toradol and symptoms have resolved. She is low risk for ACS and with negative troponin and felt comfortable discharging her home with treatment with anti-inflammatory and for probable chest wall pain.  Final Clinical Impressions(s) / ED Diagnoses   Final diagnoses:  Chest pain, unspecified chest pain type  Chest wall pain    New Prescriptions Discharge Medication List as of 03/11/2016  5:36 PM    START taking these medications   Details  naproxen (NAPROSYN) 500 MG tablet Take 1 tablet (500 mg total) by mouth 2 (two) times daily., Starting Tue 03/11/2016, Print         Tanna Furry, MD 03/14/16 [phone]

## 2016-03-13 ENCOUNTER — Encounter: Payer: Self-pay | Admitting: Internal Medicine

## 2016-03-13 ENCOUNTER — Ambulatory Visit (INDEPENDENT_AMBULATORY_CARE_PROVIDER_SITE_OTHER): Payer: 59 | Admitting: Internal Medicine

## 2016-03-13 VITALS — BP 118/66 | HR 78 | Temp 98.2°F | Wt 254.5 lb

## 2016-03-13 DIAGNOSIS — R232 Flushing: Secondary | ICD-10-CM

## 2016-03-13 DIAGNOSIS — N951 Menopausal and female climacteric states: Secondary | ICD-10-CM | POA: Diagnosis not present

## 2016-03-13 DIAGNOSIS — E559 Vitamin D deficiency, unspecified: Secondary | ICD-10-CM

## 2016-03-13 DIAGNOSIS — R0789 Other chest pain: Secondary | ICD-10-CM

## 2016-03-13 LAB — FOLLICLE STIMULATING HORMONE: FSH: 13.5 m[IU]/mL

## 2016-03-13 LAB — VITAMIN B12: Vitamin B-12: 242 pg/mL (ref 211–911)

## 2016-03-13 LAB — VITAMIN D 25 HYDROXY (VIT D DEFICIENCY, FRACTURES): VITD: 24.17 ng/mL — ABNORMAL LOW (ref 30.00–100.00)

## 2016-03-13 LAB — FOLATE: Folate: 8.6 ng/mL (ref >5.9)

## 2016-03-13 LAB — LUTEINIZING HORMONE: LH: 41.06 m[IU]/mL

## 2016-03-13 MED ORDER — METHOCARBAMOL 500 MG PO TABS: 500.0000 mg | ORAL_TABLET | Freq: Three times a day (TID) | ORAL | 0 refills | Status: DC | PRN

## 2016-03-13 NOTE — Patient Instructions (Signed)

## 2016-03-13 NOTE — Progress Notes (Signed)
Subjective:    Patient ID: [name], female    DOB: 1965/06/23, 50 y.o.   MRN: [id number]  HPI  Pt presents to the clinic for ER followup. She went to the ER 9/26 with c/o right sided chest pain. ECG, chest xray and labs were all normal. She was given Toradol with good relief. She was diagnosed with chest wall pain and advised to follow up with her PCP. She reports the pain has improved. She denies chest tightness or shortness of breath. She is following with pain management. She takes Advil or Meloxicam (alternating). She takes Norco as needed for severe pain. She has taken muscle relaxers in the past but not recently. She denies any new injury to the area.   She also reports hot flashes since her hysterectomy. She reports she has one ovary left. She is not taking anything OTC for this.  Review of Systems      Past Medical History:  Diagnosis Date  . Allergy   . DDD (degenerative disc disease), lumbar   . Depression     Current Outpatient Prescriptions  Medication Sig Dispense Refill  . albuterol (PROVENTIL HFA;VENTOLIN HFA) 108 (90 Base) MCG/ACT inhaler Inhale 2 puffs into the lungs every 6 (six) hours as needed for wheezing or shortness of breath. 1 Inhaler 0  . cefUROXime (CEFTIN) 500 MG tablet Take 1 tablet (500 mg total) by mouth 2 (two) times daily with a meal. 20 tablet 0  . FLUoxetine (PROZAC) 20 MG tablet TAKE 2 TABLETS BY MOUTH  DAILY BEFORE BREAKFAST. (Patient taking differently: TAKE 1-2 TABLETS BY MOUTH  DAILY BEFORE BREAKFAST.) 180 tablet 1  . HYDROcodone-acetaminophen (NORCO) 7.5-325 MG tablet Take 1 tablet by mouth every 6 (six) hours as needed for moderate pain.     . hydrOXYzine (ATARAX/VISTARIL) 10 MG tablet Take 10 mg by mouth at bedtime.     [name] ibuprofen (ADVIL,MOTRIN) 800 MG tablet Take 1 tablet (800 mg total) by mouth 3 (three) times daily. (Patient taking differently: Take 800 mg by mouth every 8 (eight) hours as needed for moderate pain. ) 180 tablet 0    . loratadine (CLARITIN) 10 MG tablet Take 10 mg by mouth daily.    . meloxicam (MOBIC) 7.5 MG tablet Take 7.5 mg by mouth 2 (two) times daily as needed for pain.     . naproxen (NAPROSYN) 500 MG tablet Take 1 tablet (500 mg total) by mouth 2 (two) times daily. 30 tablet 0   No current facility-administered medications for this visit.     Allergies  Allergen Reactions  . Orange Concentrate [Flavoring Agent] Rash    Rash on right side of body   . Gabapentin Other (See Comments)    Hair loss  . Penicillins Other (See Comments)    Childhood allergy Has patient had a PCN reaction causing immediate rash, facial/tongue/throat swelling, SOB or lightheadedness with hypotension: Unknown Has patient had a PCN reaction causing severe rash involving mucus membranes or skin necrosis: Unknown Has patient had a PCN reaction that required hospitalization: No  Has patient had a PCN reaction occurring within the last 10 years: No  If all of the above answers are "NO", then may proceed with Cephalosporin use.     Family History  Problem Relation Age of Onset  . Hyperlipidemia Mother   . Heart disease Maternal Aunt   . Stroke Maternal Aunt   . Diabetes Maternal Aunt   . Heart disease Maternal Uncle   . Stroke  Maternal Uncle   . Cancer Neg Hx     Social History   Social History  . Marital status: Married    Spouse name: N/A  . Number of children: N/A  . Years of education: N/A   Occupational History  . Not on file.   Social History Main Topics  . Smoking status: Never Smoker  . Smokeless tobacco: Never Used  . Alcohol use Yes     Comment: social  . Drug use: No  . Sexual activity: Yes   Other Topics Concern  . Not on file   Social History Narrative  . No narrative on file     Constitutional: Denies fever, malaise, fatigue, headache or abrupt weight changes.  Respiratory: Denies difficulty breathing, shortness of breath, cough or sputum production.   Cardiovascular: Denies  chest pain, chest tightness, palpitations or swelling in the hands or feet.  Musculoskeletal: Pt reports multiple joint and muscle pain. Denies decrease in range of motion, difficulty with gait, or joint swelling.    No other specific complaints in a complete review of systems (except as listed in HPI above).  Objective:   Physical Exam   BP 118/66   Pulse 78   Temp 98.2 F (36.8 C) (Oral)   Wt 254 lb 8 oz (115.4 kg)   SpO2 98%   BMI 41.08 kg/m  Wt Readings from Last 3 Encounters:  03/13/16 254 lb 8 oz (115.4 kg)  10/16/15 257 lb 4 oz (116.7 kg)  10/08/15 260 lb (117.9 kg)    General: Appears her stated age, obese in NAD. Musculoskeletal: No pain with palpation of the sternum or upper chest wall. No signs of joint swelling. Strength 5/5 BUE/BLE. Multiple tender points of the musculature noted. No difficulty with gait.    BMET    Component Value Date/Time   NA 139 03/11/2016 1309   NA 141 08/03/2014 1609   K 3.6 03/11/2016 1309   CL 109 03/11/2016 1309   CO2 24 03/11/2016 1309   GLUCOSE 97 03/11/2016 1309   BUN 12 03/11/2016 1309   BUN 10 08/03/2014 1609   CREATININE 0.60 03/11/2016 1309   CALCIUM 9.1 03/11/2016 1309   GFRNONAA >60 03/11/2016 1309   GFRAA >60 03/11/2016 1309    Lipid Panel     Component Value Date/Time   CHOL 206 (A) 01/31/2016   CHOL 204 (H) 08/03/2014 1609   TRIG 79 01/31/2016   HDL 62 01/31/2016   HDL 56 08/03/2014 1609   CHOLHDL 4 10/08/2015 1603   VLDL 14.0 10/08/2015 1603   LDLCALC 128 01/31/2016   LDLCALC 128 (H) 08/03/2014 1609    CBC    Component Value Date/Time   WBC 6.7 03/11/2016 1309   RBC 4.10 03/11/2016 1309   HGB 12.9 03/11/2016 1309   HCT 39.3 03/11/2016 1309   PLT 259 03/11/2016 1309   MCV 95.9 03/11/2016 1309   MCH 31.5 03/11/2016 1309   MCHC 32.8 03/11/2016 1309   RDW 13.4 03/11/2016 1309   RDW 13.7 08/03/2014 1609   LYMPHSABS 2.8 08/03/2014 1609   EOSABS 0.1 08/03/2014 1609   BASOSABS 0.0 08/03/2014 1609     Hgb A1C Lab Results  Component Value Date   HGBA1C 6.0 01/31/2016           Assessment & Plan:   ER follow up for chest wall pain:  She thinks this may be due to low Vit D- Vit D level today ER notes, labs and imaging reviewed Improving  Continue Advi/Meloxicam, Norco eRx for Robaxin 500 mg BID prn ? Fibromyalgia She declines rheumatology referral for further evaluation  Hot flashes:   Will check FSH/LH today  RTC as needed or if symptoms persist or worsen Kiari Hosmer, NP

## 2016-03-17 MED ORDER — VITAMIN D (ERGOCALCIFEROL) 1.25 MG (50000 UNIT) PO CAPS: 50000.0000 [IU] | ORAL_CAPSULE | ORAL | 0 refills | Status: DC

## 2016-03-17 NOTE — Addendum Note (Signed)
Addended by: Lurlean Nanny on: 03/17/2016 05:27 PM   Modules accepted: Orders

## 2016-04-05 ENCOUNTER — Other Ambulatory Visit: Payer: Self-pay | Admitting: Internal Medicine

## 2016-04-18 ENCOUNTER — Telehealth: Payer: Self-pay | Admitting: Internal Medicine

## 2016-04-18 NOTE — Telephone Encounter (Signed)
Done, given back to Robin 

## 2016-04-18 NOTE — Telephone Encounter (Signed)
Paperwork faxed °Copy for file °Copy for scan °

## 2016-04-18 NOTE — Telephone Encounter (Signed)
[facility] faxed paperwork wanting clarification   Paperwork in [place] was filled out exactly the same as 02/2015 I put last fmla in folder if you need  Please sign and date

## 2016-05-20 ENCOUNTER — Encounter: Payer: Self-pay | Admitting: Internal Medicine

## 2016-05-20 ENCOUNTER — Other Ambulatory Visit: Payer: Self-pay | Admitting: Internal Medicine

## 2016-05-20 DIAGNOSIS — M2550 Pain in unspecified joint: Secondary | ICD-10-CM

## 2016-05-21 MED ORDER — IBUPROFEN 800 MG PO TABS: 800.0000 mg | ORAL_TABLET | Freq: Three times a day (TID) | ORAL | 0 refills | Status: DC

## 2016-06-01 ENCOUNTER — Other Ambulatory Visit: Payer: Self-pay | Admitting: Family Medicine

## 2016-06-02 ENCOUNTER — Encounter: Payer: Self-pay | Admitting: Internal Medicine

## 2016-06-02 DIAGNOSIS — M2550 Pain in unspecified joint: Secondary | ICD-10-CM | POA: Insufficient documentation

## 2016-06-02 DIAGNOSIS — Z6841 Body Mass Index (BMI) 40.0 and over, adult: Secondary | ICD-10-CM

## 2016-06-12 ENCOUNTER — Telehealth: Payer: Self-pay | Admitting: Internal Medicine

## 2016-06-12 NOTE — Telephone Encounter (Signed)
[facility] faxed paperwork In [facility] in box

## 2016-06-12 NOTE — Telephone Encounter (Signed)
done

## 2016-06-19 NOTE — Telephone Encounter (Signed)
Paperwork faxed 12/29  Copy for file  Copy for scan Copy for pt

## 2016-07-04 ENCOUNTER — Telehealth: Payer: Self-pay | Admitting: Internal Medicine

## 2016-07-04 NOTE — Telephone Encounter (Signed)
[facility] fax paperwork needing to get additional information In [facility] in box

## 2016-07-04 NOTE — Telephone Encounter (Signed)
Done, will give back to [facility]

## 2016-07-07 NOTE — Telephone Encounter (Signed)
Paperwork faxed °Copy for pt  °Copy for file °Copy for scan °Pt aware  °

## 2016-07-08 ENCOUNTER — Encounter: Payer: Self-pay | Admitting: Internal Medicine

## 2016-07-08 MED ORDER — DICLOFENAC SODIUM 1 % TD GEL: 2.0000 g | Freq: Four times a day (QID) | TRANSDERMAL | 0 refills | Status: DC

## 2016-07-08 NOTE — Telephone Encounter (Signed)
Voltaren gel sent to pharmacy per pt request

## 2016-07-16 DIAGNOSIS — M4696 Unspecified inflammatory spondylopathy, lumbar region: Secondary | ICD-10-CM | POA: Diagnosis not present

## 2016-07-16 DIAGNOSIS — G894 Chronic pain syndrome: Secondary | ICD-10-CM | POA: Diagnosis not present

## 2016-07-16 DIAGNOSIS — M5137 Other intervertebral disc degeneration, lumbosacral region: Secondary | ICD-10-CM | POA: Diagnosis not present

## 2016-07-16 DIAGNOSIS — Z79891 Long term (current) use of opiate analgesic: Secondary | ICD-10-CM | POA: Diagnosis not present

## 2016-07-16 DIAGNOSIS — M47817 Spondylosis without myelopathy or radiculopathy, lumbosacral region: Secondary | ICD-10-CM | POA: Diagnosis not present

## 2016-07-16 DIAGNOSIS — Z79899 Other long term (current) drug therapy: Secondary | ICD-10-CM | POA: Diagnosis not present

## 2016-07-27 ENCOUNTER — Other Ambulatory Visit: Payer: Self-pay | Admitting: Internal Medicine

## 2016-07-28 NOTE — Telephone Encounter (Signed)
It looks like per pt report--taking 1-2 tablets daily--original Rx was for 2 tabs--please advise

## 2016-07-28 NOTE — Telephone Encounter (Signed)
She needs to take it as prescribed, 2 tabs daily. Ok to refill

## 2016-08-15 ENCOUNTER — Telehealth: Payer: Self-pay | Admitting: Internal Medicine

## 2016-08-15 ENCOUNTER — Encounter: Payer: Self-pay | Admitting: Internal Medicine

## 2016-08-25 ENCOUNTER — Encounter: Payer: Self-pay | Admitting: Internal Medicine

## 2016-08-29 ENCOUNTER — Telehealth: Payer: Self-pay | Admitting: Internal Medicine

## 2016-08-29 NOTE — Telephone Encounter (Signed)
fmla paperwork in [name]'s IN BOX For review and signature

## 2016-09-01 NOTE — Telephone Encounter (Signed)
Done given to Shirlean Mylar

## 2016-09-10 ENCOUNTER — Encounter: Payer: Self-pay | Admitting: Internal Medicine

## 2016-09-10 NOTE — Telephone Encounter (Signed)
Paperwork faxed 09/02/16 Copy for file Copy for scan Copy for pt

## 2016-09-18 ENCOUNTER — Ambulatory Visit (INDEPENDENT_AMBULATORY_CARE_PROVIDER_SITE_OTHER): Payer: 59 | Admitting: Internal Medicine

## 2016-09-18 ENCOUNTER — Encounter: Payer: Self-pay | Admitting: Internal Medicine

## 2016-09-18 VITALS — BP 126/68 | HR 92 | Temp 98.0°F | Ht 65.25 in | Wt 266.8 lb

## 2016-09-18 DIAGNOSIS — Z23 Encounter for immunization: Secondary | ICD-10-CM | POA: Diagnosis not present

## 2016-09-18 DIAGNOSIS — M5136 Other intervertebral disc degeneration, lumbar region: Secondary | ICD-10-CM | POA: Diagnosis not present

## 2016-09-18 DIAGNOSIS — F418 Other specified anxiety disorders: Secondary | ICD-10-CM

## 2016-09-18 DIAGNOSIS — F32A Depression, unspecified: Secondary | ICD-10-CM

## 2016-09-18 DIAGNOSIS — Z0001 Encounter for general adult medical examination with abnormal findings: Secondary | ICD-10-CM | POA: Diagnosis not present

## 2016-09-18 DIAGNOSIS — J301 Allergic rhinitis due to pollen: Secondary | ICD-10-CM

## 2016-09-18 DIAGNOSIS — E559 Vitamin D deficiency, unspecified: Secondary | ICD-10-CM

## 2016-09-18 DIAGNOSIS — F419 Anxiety disorder, unspecified: Secondary | ICD-10-CM

## 2016-09-18 DIAGNOSIS — F329 Major depressive disorder, single episode, unspecified: Secondary | ICD-10-CM

## 2016-09-18 DIAGNOSIS — J302 Other seasonal allergic rhinitis: Secondary | ICD-10-CM | POA: Insufficient documentation

## 2016-09-18 MED ORDER — TRAMADOL HCL 50 MG PO TABS: 50.0000 mg | ORAL_TABLET | Freq: Three times a day (TID) | ORAL | 0 refills | Status: DC | PRN

## 2016-09-18 MED ORDER — MELOXICAM 7.5 MG PO TABS
7.5000 mg | ORAL_TABLET | Freq: Two times a day (BID) | ORAL | 3 refills | Status: DC | PRN
Start: 2016-09-18 — End: 2017-08-06

## 2016-09-18 NOTE — Progress Notes (Signed)
Pre visit review using our clinic review tool, if applicable. No additional management support is needed unless otherwise documented below in the visit note. 

## 2016-09-18 NOTE — Assessment & Plan Note (Signed)
Encouraged weight loss and staying active Discussed stretching exercises daily Meloxicam refilled RX for Tramadol provided today Continue Robaxin as needed

## 2016-09-18 NOTE — Assessment & Plan Note (Signed)
Chronic Advised her to restart her medication, she does not need a refill at this time Will monitor

## 2016-09-18 NOTE — Assessment & Plan Note (Signed)
Controlled on Allegra prn Can take Flonase prn if worse

## 2016-09-18 NOTE — Progress Notes (Signed)
Subjective:    Patient ID: [name], female    DOB: [birth year], 51 y.o.   MRN: [id number]  HPI  Pt presents to the clinic today for her annual exam. She is also due for follow up of chronic conditions.  DDD of Lumbar Spine: Ongoing issues. She reports she has not taken her medications in a while, because she no longer wants to follow with the pain clinic. She has gained weight since her last visit. She is not really exercising. She is requesting a refill of her Meloxicam. She rarely uses Robaxin. She wants to stop taking Hydrocodone and would like to start taking Tramadol if possible.  Anxiety and Depression: Chronic, triggered by work related stress. She is leaving her job next week and will be starting a new job at [employer]. She is hoping the job change will improve her stress. She is not currently taking her Prozac but feels like she would benefit from restarting it.   Seasonal Allergies: Worse in the spring. She is currently taking Allegra with good relief of symptoms.   Flu: 03/2015 Tetanus: 01/2007 Pap Smear: 05/2013, partial hysterectomy Mammogram: 12/2015 Colon Screening: never Vision Screening: 09/2015 Dentist: every 2 years  Diet: She does eat lean meat. She consumes fruits and veggies daily. She does eat some fried foods. She drinks 1 soda daily, mostly water. Exercise: She just started walking, for about 30 minutes 2 days a week.  Review of Systems      Past Medical History:  Diagnosis Date  . Allergy   . DDD (degenerative disc disease), lumbar   . Depression     Current Outpatient Prescriptions  Medication Sig Dispense Refill  . albuterol (PROVENTIL HFA;VENTOLIN HFA) 108 (90 Base) MCG/ACT inhaler Inhale 2 puffs into the lungs every 6 (six) hours as needed for wheezing or shortness of breath. 1 Inhaler 0  . cefUROXime (CEFTIN) 500 MG tablet Take 1 tablet (500 mg total) by mouth 2 (two) times daily with a meal. 20 tablet 0  .  diclofenac sodium (VOLTAREN) 1 % GEL Apply 2 g topically 4 (four) times daily. 1 Tube 0  . FLUoxetine (PROZAC) 20 MG tablet TAKE 2 TABLETS BY MOUTH  DAILY BEFORE BREAKFAST 180 tablet 0  . HYDROcodone-acetaminophen (NORCO) 7.5-325 MG tablet Take 1 tablet by mouth every 6 (six) hours as needed for moderate pain.     . hydrOXYzine (ATARAX/VISTARIL) 10 MG tablet Take 10 mg by mouth at bedtime.     [name] ibuprofen (ADVIL,MOTRIN) 800 MG tablet TAKE 1 TABLET BY MOUTH 3  TIMES DAILY 180 tablet 0  . loratadine (CLARITIN) 10 MG tablet Take 10 mg by mouth daily.    . meloxicam (MOBIC) 7.5 MG tablet Take 7.5 mg by mouth 2 (two) times daily as needed for pain.     . methocarbamol (ROBAXIN) 500 MG tablet Take 1 tablet (500 mg total) by mouth every 8 (eight) hours as needed for muscle spasms. 30 tablet 0  . naproxen (NAPROSYN) 500 MG tablet Take 1 tablet (500 mg total) by mouth 2 (two) times daily. 30 tablet 0  . Vitamin D, Ergocalciferol, (DRISDOL) 50000 units CAPS capsule Take 1 capsule (50,000 Units total) by mouth every 7 (seven) days. 8 capsule 0   No current facility-administered medications for this visit.     Allergies  Allergen Reactions  . Orange Concentrate [Flavoring Agent] Rash    Rash on right side of body   . Gabapentin Other (See Comments)  Hair loss  . Penicillins Other (See Comments)    Childhood allergy Has patient had a PCN reaction causing immediate rash, facial/tongue/throat swelling, SOB or lightheadedness with hypotension: Unknown Has patient had a PCN reaction causing severe rash involving mucus membranes or skin necrosis: Unknown Has patient had a PCN reaction that required hospitalization: No  Has patient had a PCN reaction occurring within the last 10 years: No  If all of the above answers are "NO", then may proceed with Cephalosporin use.     Family History  Problem Relation Age of Onset  . Hyperlipidemia Mother   . Heart disease Maternal Aunt   . Stroke Maternal Aunt     . Diabetes Maternal Aunt   . Heart disease Maternal Uncle   . Stroke Maternal Uncle   . Cancer Neg Hx     Social History   Social History  . Marital status: Married    Spouse name: N/A  . Number of children: N/A  . Years of education: N/A   Occupational History  . Not on file.   Social History Main Topics  . Smoking status: Never Smoker  . Smokeless tobacco: Never Used  . Alcohol use Yes     Comment: social  . Drug use: No  . Sexual activity: Yes   Other Topics Concern  . Not on file   Social History Narrative  . No narrative on file     Constitutional: Pt reports weight gainDenies fever, malaise, fatigue, headache or abrupt weight changes.  HEENT: Denies eye pain, eye redness, ear pain, ringing in the ears, wax buildup, runny nose, nasal congestion, bloody nose, or sore throat. Respiratory: Denies difficulty breathing, shortness of breath, cough or sputum production.   Cardiovascular: Pt reports intermittent chest pain (anxiety related). Denies chest tightness, palpitations or swelling in the hands or feet.  Gastrointestinal: Denies abdominal pain, bloating, constipation, diarrhea or blood in the stool.  GU: Denies urgency, frequency, pain with urination, burning sensation, blood in urine, odor or discharge. Musculoskeletal: Pt reports low back pain. Denies decrease in range of motion, difficulty with gait, muscle pain or joint swelling.  Skin: Denies redness, rashes, lesions or ulcercations.  Neurological: Denies dizziness, difficulty with memory, difficulty with speech or problems with balance and coordination.  Psych: Pt has history of anxiety and depression. Denies SI/HI.  No other specific complaints in a complete review of systems (except as listed in HPI above).  Objective:   Physical Exam   BP 126/68   Pulse 92   Temp 98 F (36.7 C) (Oral)   Ht 5' 5.25" (1.657 m)   Wt 266 lb 12 oz (121 kg)   SpO2 96%   BMI 44.05 kg/m   Wt Readings from Last 3  Encounters:  03/13/16 254 lb 8 oz (115.4 kg)  10/16/15 257 lb 4 oz (116.7 kg)  10/08/15 260 lb (117.9 kg)    General: Appears her stated age, obese in NAD. Skin: Warm, dry and intact. Multiple skin tags noted on neck.  HEENT: Head: normal shape and size; Eyes: sclera white, no icterus, conjunctiva pink, PERRLA and EOMs intact; Ears: Tm's gray and intact, normal light reflex; Throat/Mouth: Teeth present, mucosa pink and moist, no exudate, lesions or ulcerations noted.  Neck:  Neck supple, trachea midline. No masses, lumps or thyromegaly present.  Cardiovascular: Normal rate and rhythm. S1,S2 noted.  No murmur, rubs or gallops noted. No JVD or BLE edema. No carotid bruits noted. Pulmonary/Chest: Normal effort and positive vesicular breath  sounds. No respiratory distress. No wheezes, rales or ronchi noted.  Abdomen: Soft and nontender. Normal bowel sounds. No distention or masses noted. Liver, spleen and kidneys non palpable. Musculoskeletal: Strength 5/5 BUE/BLE No difficulty with gait.  Neurological: Alert and oriented. Cranial nerves II-XII grossly intact. Coordination normal.  Psychiatric: Mood and affect normal. Behavior is normal. Judgment and thought content normal.    BMET    Component Value Date/Time   NA 139 03/11/2016 1309   NA 141 08/03/2014 1609   K 3.6 03/11/2016 1309   CL 109 03/11/2016 1309   CO2 24 03/11/2016 1309   GLUCOSE 97 03/11/2016 1309   BUN 12 03/11/2016 1309   BUN 10 08/03/2014 1609   CREATININE 0.60 03/11/2016 1309   CALCIUM 9.1 03/11/2016 1309   GFRNONAA >60 03/11/2016 1309   GFRAA >60 03/11/2016 1309    Lipid Panel     Component Value Date/Time   CHOL 206 (A) 01/31/2016   CHOL 204 (H) 08/03/2014 1609   TRIG 79 01/31/2016   HDL 62 01/31/2016   HDL 56 08/03/2014 1609   CHOLHDL 4 10/08/2015 1603   VLDL 14.0 10/08/2015 1603   LDLCALC 128 01/31/2016   LDLCALC 128 (H) 08/03/2014 1609    CBC    Component Value Date/Time   WBC 6.7 03/11/2016 1309     RBC 4.10 03/11/2016 1309   HGB 12.9 03/11/2016 1309   HCT 39.3 03/11/2016 1309   PLT 259 03/11/2016 1309   MCV 95.9 03/11/2016 1309   MCH 31.5 03/11/2016 1309   MCHC 32.8 03/11/2016 1309   RDW 13.4 03/11/2016 1309   RDW 13.7 08/03/2014 1609   LYMPHSABS 2.8 08/03/2014 1609   EOSABS 0.1 08/03/2014 1609   BASOSABS 0.0 08/03/2014 1609    Hgb A1C Lab Results  Component Value Date   HGBA1C 6.0 01/31/2016          Assessment & Plan:   Preventative Health Maintenance:  Encouraged her to get a flu shot in the fall Tdap today Pelvic exam due 2019 Advised her to schedule her mammogram for July 2018 She declines colonoscopy but is agreeable to Cologuard-ordered Encouraged her to consume a balanced diet and exercise regime Advised her to see an eye doctor and dentist annually Will check CBC, CMET, Lipid, A1C and Vit D today  RTC in 1 year, sooner if needed Webb Silversmith, NP

## 2016-09-18 NOTE — Patient Instructions (Signed)
Health Maintenance for Postmenopausal Women Menopause is a normal process in which your reproductive ability comes to an end. This process happens gradually over a span of months to years, usually between the ages of 33 and 38. Menopause is complete when you have missed 12 consecutive menstrual periods. It is important to talk with your health care provider about some of the most common conditions that affect postmenopausal women, such as heart disease, cancer, and bone loss (osteoporosis). Adopting a healthy lifestyle and getting preventive care can help to promote your health and wellness. Those actions can also lower your chances of developing some of these common conditions. What should I know about menopause? During menopause, you may experience a number of symptoms, such as:  Moderate-to-severe hot flashes.  Night sweats.  Decrease in sex drive.  Mood swings.  Headaches.  Tiredness.  Irritability.  Memory problems.  Insomnia. Choosing to treat or not to treat menopausal changes is an individual decision that you make with your health care provider. What should I know about hormone replacement therapy and supplements? Hormone therapy products are effective for treating symptoms that are associated with menopause, such as hot flashes and night sweats. Hormone replacement carries certain risks, especially as you become older. If you are thinking about using estrogen or estrogen with progestin treatments, discuss the benefits and risks with your health care provider. What should I know about heart disease and stroke? Heart disease, heart attack, and stroke become more likely as you age. This may be due, in part, to the hormonal changes that your body experiences during menopause. These can affect how your body processes dietary fats, triglycerides, and cholesterol. Heart attack and stroke are both medical emergencies. There are many things that you can do to help prevent heart disease  and stroke:  Have your blood pressure checked at least every 1-2 years. High blood pressure causes heart disease and increases the risk of stroke.  If you are 48-61 years old, ask your health care provider if you should take aspirin to prevent a heart attack or a stroke.  Do not use any tobacco products, including cigarettes, chewing tobacco, or electronic cigarettes. If you need help quitting, ask your health care provider.  It is important to eat a healthy diet and maintain a healthy weight.  Be sure to include plenty of vegetables, fruits, low-fat dairy products, and lean protein.  Avoid eating foods that are high in solid fats, added sugars, or salt (sodium).  Get regular exercise. This is one of the most important things that you can do for your health.  Try to exercise for at least 150 minutes each week. The type of exercise that you do should increase your heart rate and make you sweat. This is known as moderate-intensity exercise.  Try to do strengthening exercises at least twice each week. Do these in addition to the moderate-intensity exercise.  Know your numbers.Ask your health care provider to check your cholesterol and your blood glucose. Continue to have your blood tested as directed by your health care provider. What should I know about cancer screening? There are several types of cancer. Take the following steps to reduce your risk and to catch any cancer development as early as possible. Breast Cancer  Practice breast self-awareness.  This means understanding how your breasts normally appear and feel.  It also means doing regular breast self-exams. Let your health care provider know about any changes, no matter how small.  If you are 40 or older,  have a clinician do a breast exam (clinical breast exam or CBE) every year. Depending on your age, family history, and medical history, it may be recommended that you also have a yearly breast X-ray (mammogram).  If you  have a family history of breast cancer, talk with your health care provider about genetic screening.  If you are at high risk for breast cancer, talk with your health care provider about having an MRI and a mammogram every year.  Breast cancer (BRCA) gene test is recommended for women who have family members with BRCA-related cancers. Results of the assessment will determine the need for genetic counseling and BRCA1 and for BRCA2 testing. BRCA-related cancers include these types:  Breast. This occurs in males or females.  Ovarian.  Tubal. This may also be called fallopian tube cancer.  Cancer of the abdominal or pelvic lining (peritoneal cancer).  Prostate.  Pancreatic. Cervical, Uterine, and Ovarian Cancer  Your health care provider may recommend that you be screened regularly for cancer of the pelvic organs. These include your ovaries, uterus, and vagina. This screening involves a pelvic exam, which includes checking for microscopic changes to the surface of your cervix (Pap test).  For women ages 21-65, health care providers may recommend a pelvic exam and a Pap test every three years. For women ages 23-65, they may recommend the Pap test and pelvic exam, combined with testing for human papilloma virus (HPV), every five years. Some types of HPV increase your risk of cervical cancer. Testing for HPV may also be done on women of any age who have unclear Pap test results.  Other health care providers may not recommend any screening for nonpregnant women who are considered low risk for pelvic cancer and have no symptoms. Ask your health care provider if a screening pelvic exam is right for you.  If you have had past treatment for cervical cancer or a condition that could lead to cancer, you need Pap tests and screening for cancer for at least 20 years after your treatment. If Pap tests have been discontinued for you, your risk factors (such as having a new sexual partner) need to be reassessed  to determine if you should start having screenings again. Some women have medical problems that increase the chance of getting cervical cancer. In these cases, your health care provider may recommend that you have screening and Pap tests more often.  If you have a family history of uterine cancer or ovarian cancer, talk with your health care provider about genetic screening.  If you have vaginal bleeding after reaching menopause, tell your health care provider.  There are currently no reliable tests available to screen for ovarian cancer. Lung Cancer  Lung cancer screening is recommended for adults 99-83 years old who are at high risk for lung cancer because of a history of smoking. A yearly low-dose CT scan of the lungs is recommended if you:  Currently smoke.  Have a history of at least 30 pack-years of smoking and you currently smoke or have quit within the past 15 years. A pack-year is smoking an average of one pack of cigarettes per day for one year. Yearly screening should:  Continue until it has been 15 years since you quit.  Stop if you develop a health problem that would prevent you from having lung cancer treatment. Colorectal Cancer  This type of cancer can be detected and can often be prevented.  Routine colorectal cancer screening usually begins at age 72 and continues  through age 75.  If you have risk factors for colon cancer, your health care provider may recommend that you be screened at an earlier age.  If you have a family history of colorectal cancer, talk with your health care provider about genetic screening.  Your health care provider may also recommend using home test kits to check for hidden blood in your stool.  A small camera at the end of a tube can be used to examine your colon directly (sigmoidoscopy or colonoscopy). This is done to check for the earliest forms of colorectal cancer.  Direct examination of the colon should be repeated every 5-10 years until  age 75. However, if early forms of precancerous polyps or small growths are found or if you have a family history or genetic risk for colorectal cancer, you may need to be screened more often. Skin Cancer  Check your skin from head to toe regularly.  Monitor any moles. Be sure to tell your health care provider:  About any new moles or changes in moles, especially if there is a change in a mole's shape or color.  If you have a mole that is larger than the size of a pencil eraser.  If any of your family members has a history of skin cancer, especially at a young age, talk with your health care provider about genetic screening.  Always use sunscreen. Apply sunscreen liberally and repeatedly throughout the day.  Whenever you are outside, protect yourself by wearing long sleeves, pants, a wide-brimmed hat, and sunglasses. What should I know about osteoporosis? Osteoporosis is a condition in which bone destruction happens more quickly than new bone creation. After menopause, you may be at an increased risk for osteoporosis. To help prevent osteoporosis or the bone fractures that can happen because of osteoporosis, the following is recommended:  If you are 19-50 years old, get at least 1,000 mg of calcium and at least 600 mg of vitamin D per day.  If you are older than age 50 but younger than age 70, get at least 1,200 mg of calcium and at least 600 mg of vitamin D per day.  If you are older than age 70, get at least 1,200 mg of calcium and at least 800 mg of vitamin D per day. Smoking and excessive alcohol intake increase the risk of osteoporosis. Eat foods that are rich in calcium and vitamin D, and do weight-bearing exercises several times each week as directed by your health care provider. What should I know about how menopause affects my mental health? Depression may occur at any age, but it is more common as you become older. Common symptoms of depression include:  Low or sad  mood.  Changes in sleep patterns.  Changes in appetite or eating patterns.  Feeling an overall lack of motivation or enjoyment of activities that you previously enjoyed.  Frequent crying spells. Talk with your health care provider if you think that you are experiencing depression. What should I know about immunizations? It is important that you get and maintain your immunizations. These include:  Tetanus, diphtheria, and pertussis (Tdap) booster vaccine.  Influenza every year before the flu season begins.  Pneumonia vaccine.  Shingles vaccine. Your health care provider may also recommend other immunizations. This information is not intended to replace advice given to you by your health care provider. Make sure you discuss any questions you have with your health care provider. Document Released: 07/25/2005 Document Revised: 12/21/2015 Document Reviewed: 03/06/2015 Elsevier Interactive Patient   Education  2017 [employer]  

## 2016-09-19 LAB — COMPREHENSIVE METABOLIC PANEL
ALT: 9 IU/L (ref 0–32)
AST: 10 IU/L (ref 0–40)
Albumin/Globulin Ratio: 1.3 (ref 1.2–2.2)
Albumin: 4 g/dL (ref 3.5–5.5)
Alkaline Phosphatase: 53 IU/L (ref 39–117)
BUN/Creatinine Ratio: 14 (ref 9–23)
BUN: 9 mg/dL (ref 6–24)
Bilirubin Total: 0.4 mg/dL (ref 0.0–1.2)
CO2: 23 mmol/L (ref 18–29)
Calcium: 9.6 mg/dL (ref 8.7–10.2)
Chloride: 105 mmol/L (ref 96–106)
Creatinine, Ser: 0.66 mg/dL (ref 0.57–1.00)
GFR calc Af Amer: 119 mL/min/{1.73_m2} (ref >59)
GFR calc non Af Amer: 103 mL/min/{1.73_m2} (ref >59)
Globulin, Total: 3.1 g/dL (ref 1.5–4.5)
Glucose: 114 mg/dL — ABNORMAL HIGH (ref 65–99)
Potassium: 4 mmol/L (ref 3.5–5.2)
Sodium: 142 mmol/L (ref 134–144)
Total Protein: 7.1 g/dL (ref 6.0–8.5)

## 2016-09-19 LAB — LIPID PANEL
Chol/HDL Ratio: 3.5 ratio (ref 0.0–4.4)
Cholesterol, Total: 248 mg/dL — ABNORMAL HIGH (ref 100–199)
HDL: 70 mg/dL (ref >39)
LDL Calculated: 161 mg/dL — ABNORMAL HIGH (ref 0–99)
Triglycerides: 83 mg/dL (ref 0–149)
VLDL Cholesterol Cal: 17 mg/dL (ref 5–40)

## 2016-09-19 LAB — HEMOGLOBIN A1C
Est. average glucose Bld gHb Est-mCnc: 111 mg/dL
Hgb A1c MFr Bld: 5.5 % (ref 4.8–5.6)

## 2016-09-19 LAB — CBC
Hematocrit: 38.1 % (ref 34.0–46.6)
Hemoglobin: 13.2 g/dL (ref 11.1–15.9)
MCH: 32.7 pg (ref 26.6–33.0)
MCHC: 34.6 g/dL (ref 31.5–35.7)
MCV: 94 fL (ref 79–97)
Platelets: 268 10*3/uL (ref 150–379)
RBC: 4.04 x10E6/uL (ref 3.77–5.28)
RDW: 12.8 % (ref 12.3–15.4)
WBC: 5.4 10*3/uL (ref 3.4–10.8)

## 2016-09-19 LAB — VITAMIN D 25 HYDROXY (VIT D DEFICIENCY, FRACTURES): Vit D, 25-Hydroxy: 20.6 ng/mL — ABNORMAL LOW (ref 30.0–100.0)

## 2016-09-23 MED ORDER — VITAMIN D (ERGOCALCIFEROL) 1.25 MG (50000 UNIT) PO CAPS: 50000.0000 [IU] | ORAL_CAPSULE | ORAL | 0 refills | Status: DC

## 2016-09-23 NOTE — Addendum Note (Signed)
Addended by: Lurlean Nanny on: 09/23/2016 03:47 PM   Modules accepted: Orders

## 2016-09-24 ENCOUNTER — Telehealth: Payer: Self-pay | Admitting: *Deleted

## 2016-09-24 ENCOUNTER — Telehealth: Payer: Self-pay | Admitting: Internal Medicine

## 2016-09-24 NOTE — Telephone Encounter (Signed)
Faxed per pt request...will contact pt to let her know

## 2016-09-24 NOTE — Telephone Encounter (Signed)
Patient called and she needs documentation of T dap that was done on 09/18/16.  Patient would like documentation faxed to [facility] Dept. at fax number [phone].

## 2016-09-24 NOTE — Telephone Encounter (Signed)
Left message on voicemail.

## 2016-09-24 NOTE — Telephone Encounter (Signed)
Rec'd fax pt requesting refills on her Tramadol and Meloxicam. Pt see Webb Silversmith @ [email or web address] forwarding msg to correct office...Johny Chess

## 2016-09-24 NOTE — Telephone Encounter (Signed)
Ok to send in Meloxicam. Tramadol was approved on 09/18/16. Was this phoned in?

## 2016-09-25 ENCOUNTER — Encounter: Payer: Self-pay | Admitting: Internal Medicine

## 2016-09-25 NOTE — Telephone Encounter (Signed)
Pt states she had written Rx and took to local pharmacy---it was requested by mail order---disregard

## 2016-09-25 NOTE — Telephone Encounter (Signed)
mychart msg sent to pt to see if she received Rx at her OV as it stated it was printed

## 2016-10-08 ENCOUNTER — Encounter: Payer: 59 | Admitting: Internal Medicine

## 2016-10-16 ENCOUNTER — Other Ambulatory Visit: Payer: Self-pay | Admitting: Internal Medicine

## 2016-10-22 MED ORDER — FLUOXETINE HCL 20 MG PO TABS: ORAL_TABLET | ORAL | 1 refills | Status: DC

## 2016-10-29 ENCOUNTER — Encounter: Payer: Self-pay | Admitting: Internal Medicine

## 2016-11-14 MED ORDER — FLUOXETINE HCL 20 MG PO TABS: ORAL_TABLET | ORAL | 1 refills | Status: DC

## 2016-11-14 NOTE — Addendum Note (Signed)
Addended by: Lurlean Nanny on: 11/14/2016 11:27 AM   Modules accepted: Orders

## 2016-12-01 ENCOUNTER — Encounter: Payer: Self-pay | Admitting: Internal Medicine

## 2016-12-02 MED ORDER — FLUOXETINE HCL 20 MG PO TABS: ORAL_TABLET | ORAL | 1 refills | Status: DC

## 2016-12-03 ENCOUNTER — Encounter: Payer: Self-pay | Admitting: Internal Medicine

## 2016-12-03 MED ORDER — FLUOXETINE HCL 20 MG PO CAPS: 20.0000 mg | ORAL_CAPSULE | Freq: Every day | ORAL | 1 refills | Status: DC

## 2016-12-05 MED ORDER — FLUOXETINE HCL 20 MG PO CAPS: 40.0000 mg | ORAL_CAPSULE | Freq: Every day | ORAL | 1 refills | Status: DC

## 2016-12-05 NOTE — Addendum Note (Signed)
Addended by: Lindalou Hose Y on: 12/05/2016 11:00 AM   Modules accepted: Orders

## 2016-12-05 NOTE — Addendum Note (Signed)
Addended by: Lurlean Nanny on: 12/05/2016 04:34 PM   Modules accepted: Orders

## 2017-01-09 ENCOUNTER — Encounter: Payer: Self-pay | Admitting: Physician Assistant

## 2017-01-09 ENCOUNTER — Ambulatory Visit: Payer: Self-pay | Admitting: Physician Assistant

## 2017-01-09 VITALS — BP 110/70 | HR 91 | Temp 98.5°F | Resp 16

## 2017-01-09 DIAGNOSIS — R51 Headache: Secondary | ICD-10-CM

## 2017-01-09 DIAGNOSIS — R519 Headache, unspecified: Secondary | ICD-10-CM

## 2017-01-09 DIAGNOSIS — W57XXXA Bitten or stung by nonvenomous insect and other nonvenomous arthropods, initial encounter: Secondary | ICD-10-CM

## 2017-01-09 MED ORDER — PREDNISONE 10 MG PO TABS: 30.0000 mg | ORAL_TABLET | Freq: Every day | ORAL | 0 refills | Status: DC

## 2017-01-09 MED ORDER — DOXYCYCLINE HYCLATE 100 MG PO TABS: 100.0000 mg | ORAL_TABLET | Freq: Two times a day (BID) | ORAL | 0 refills | Status: DC

## 2017-01-09 NOTE — Progress Notes (Signed)
S: c/o headache, body aches, sinus pain and pressure, some runny nose and congestion, no fever/chills, did pull a tick off a couple of weeks ago, no rash  O: vitals wnl, pt tearful, tms dull, nasal mucosa boggy, throat wnl, neck supple no lymph, lungs c t a, cv rrr  A: tick bite, headache,   P: doxy 100mg  bid x 14d, pred 30mg  qd x 3d, rmsf and lymes test

## 2017-01-13 LAB — ROCKY MTN SPOTTED FVR AB, IGG-BLOOD: RMSF IgG: NEGATIVE

## 2017-01-13 LAB — B. BURGDORFI ANTIBODIES: Lyme IgG/IgM Ab: <0.91 {ISR} (ref 0.00–0.90)

## 2017-01-13 LAB — ROCKY MTN SPOTTED FVR AB, IGM-BLOOD: RMSF IgM: 0.55 index (ref 0.00–0.89)

## 2017-03-09 ENCOUNTER — Other Ambulatory Visit: Payer: Self-pay | Admitting: Internal Medicine

## 2017-03-20 ENCOUNTER — Encounter: Payer: Self-pay | Admitting: Internal Medicine

## 2017-03-23 ENCOUNTER — Ambulatory Visit: Payer: Self-pay | Admitting: Physician Assistant

## 2017-03-23 DIAGNOSIS — L918 Other hypertrophic disorders of the skin: Secondary | ICD-10-CM

## 2017-03-23 DIAGNOSIS — Z299 Encounter for prophylactic measures, unspecified: Secondary | ICD-10-CM

## 2017-03-23 NOTE — Progress Notes (Signed)
S: pt here for labs but would like for [place] to cut off skin tag on her face, hx of multiple skin tags, no other complaints, ros neg  O: vitals wnl, nad, skin with dark dried skin tage on r cheek, pt has multiple freckles and skin tabs, no redness, pus or drainage  Cleaned area with etoh, used #11 blade to remove skin tag, area did not bleed, neosporin applied, pt tolerated procedure well  A: skin tag removal  P: f/u prn

## 2017-03-23 NOTE — Addendum Note (Signed)
Addended by: Rudene Anda T on: 03/23/2017 04:03 PM   Modules accepted: Orders

## 2017-03-23 NOTE — Addendum Note (Signed)
Addended by: Rudene Anda T on: 03/23/2017 04:01 PM   Modules accepted: Orders

## 2017-03-24 ENCOUNTER — Other Ambulatory Visit: Payer: Self-pay | Admitting: Internal Medicine

## 2017-03-24 ENCOUNTER — Encounter: Payer: Self-pay | Admitting: Internal Medicine

## 2017-03-24 LAB — LIPID PANEL
Chol/HDL Ratio: 3.5 ratio (ref 0.0–4.4)
Cholesterol, Total: 200 mg/dL — ABNORMAL HIGH (ref 100–199)
HDL: 57 mg/dL (ref >39)
LDL Calculated: 124 mg/dL — ABNORMAL HIGH (ref 0–99)
Triglycerides: 96 mg/dL (ref 0–149)
VLDL Cholesterol Cal: 19 mg/dL (ref 5–40)

## 2017-03-24 LAB — VITAMIN D 25 HYDROXY (VIT D DEFICIENCY, FRACTURES): Vit D, 25-Hydroxy: 21.6 ng/mL — ABNORMAL LOW (ref 30.0–100.0)

## 2017-03-24 MED ORDER — VITAMIN D (ERGOCALCIFEROL) 1.25 MG (50000 UNIT) PO CAPS: 50000.0000 [IU] | ORAL_CAPSULE | ORAL | 0 refills | Status: DC

## 2017-03-25 ENCOUNTER — Encounter: Payer: Self-pay | Admitting: Internal Medicine

## 2017-04-01 ENCOUNTER — Ambulatory Visit: Payer: Self-pay | Admitting: Physician Assistant

## 2017-04-08 ENCOUNTER — Encounter: Payer: Self-pay | Admitting: Internal Medicine

## 2017-04-26 ENCOUNTER — Encounter: Payer: Self-pay | Admitting: Internal Medicine

## 2017-05-01 ENCOUNTER — Ambulatory Visit: Payer: Self-pay | Admitting: Internal Medicine

## 2017-05-01 ENCOUNTER — Ambulatory Visit: Payer: Self-pay | Admitting: Physician Assistant

## 2017-05-01 ENCOUNTER — Encounter: Payer: Self-pay | Admitting: Physician Assistant

## 2017-05-01 ENCOUNTER — Telehealth: Payer: Self-pay

## 2017-05-01 VITALS — BP 120/70 | HR 80 | Temp 98.5°F | Resp 16

## 2017-05-01 DIAGNOSIS — R2232 Localized swelling, mass and lump, left upper limb: Secondary | ICD-10-CM

## 2017-05-01 DIAGNOSIS — Z299 Encounter for prophylactic measures, unspecified: Secondary | ICD-10-CM

## 2017-05-01 NOTE — Telephone Encounter (Signed)
No Spot was filled anyway

## 2017-05-01 NOTE — Telephone Encounter (Signed)
Copied from [place] [phone]. Topic: Quick Communication - Appointment Cancellation >> May 01, 2017  9:28 AM Arletha Grippe wrote: Patient called to cancel appointment scheduled for 1115. Patient has not rescheduled their appointment.    Route to department's PEC pool.

## 2017-05-01 NOTE — Telephone Encounter (Signed)
Pt cancelled appt for today at 11:15 for place under the arm; pt has appt with Ashok Cordia PA today at 11:40 for same issue. Do you want to charge late cancellation fee?

## 2017-05-01 NOTE — Progress Notes (Addendum)
S: Patient complains of having a small bump under the left arm for several months. States it's a little harder than it was and she is more sore under the arm at this time. Has not had a mammogram this year. Denies fever, chills, chest pain, shortness of breath. Denies any breast changes.  O: Vitals are normal, NAD, left axilla has small hard almost like a skin tag in the upper medial aspect, there is some tenderness in the lower aspect of the axilla adjacent to the left breast, questionable lymph node noted at the edge of this area medially  A: axillary lump  P: reassurance on small lump, will order screening mammogram Copy mammogram results to Webb Silversmith, NP

## 2017-05-06 ENCOUNTER — Ambulatory Visit
Admission: RE | Admit: 2017-05-06 | Discharge: 2017-05-06 | Disposition: A | Discharge status: Home / Self Care | Payer: Managed Care, Other (non HMO) | Source: Ambulatory Visit | Attending: Physician Assistant | Admitting: Physician Assistant

## 2017-05-06 ENCOUNTER — Other Ambulatory Visit: Payer: Self-pay | Admitting: Physician Assistant

## 2017-05-06 DIAGNOSIS — Z299 Encounter for prophylactic measures, unspecified: Secondary | ICD-10-CM | POA: Insufficient documentation

## 2017-05-06 DIAGNOSIS — N644 Mastodynia: Secondary | ICD-10-CM

## 2017-05-14 ENCOUNTER — Encounter: Payer: Self-pay | Admitting: Internal Medicine

## 2017-05-14 MED ORDER — TRAMADOL HCL 50 MG PO TABS: 50.0000 mg | ORAL_TABLET | Freq: Every day | ORAL | 0 refills | Status: DC | PRN

## 2017-05-14 NOTE — Telephone Encounter (Signed)
Ok to phone in Tramadol 

## 2017-05-14 NOTE — Telephone Encounter (Signed)
Last filled 09/2016 #90--please advise

## 2017-05-15 NOTE — Telephone Encounter (Signed)
Rx called in to pharmacy. 

## 2017-06-12 ENCOUNTER — Encounter: Payer: Self-pay | Admitting: Internal Medicine

## 2017-06-12 MED ORDER — HYDROXYZINE HCL 10 MG PO TABS: 10.0000 mg | ORAL_TABLET | Freq: Every day | ORAL | 0 refills | Status: DC | PRN

## 2017-06-12 MED ORDER — IBUPROFEN-FAMOTIDINE 800-26.6 MG PO TABS: 1.0000 | ORAL_TABLET | Freq: Two times a day (BID) | ORAL | 2 refills | Status: DC | PRN

## 2017-06-15 ENCOUNTER — Encounter: Payer: Self-pay | Admitting: Internal Medicine

## 2017-06-22 ENCOUNTER — Ambulatory Visit: Payer: Self-pay | Admitting: Internal Medicine

## 2017-06-30 ENCOUNTER — Other Ambulatory Visit: Payer: Self-pay | Admitting: Internal Medicine

## 2017-07-10 ENCOUNTER — Other Ambulatory Visit: Payer: Self-pay | Admitting: Internal Medicine

## 2017-07-13 ENCOUNTER — Encounter: Payer: Self-pay | Admitting: Internal Medicine

## 2017-07-14 MED ORDER — DICLOFENAC SODIUM 1 % TD GEL: 2.0000 g | Freq: Four times a day (QID) | TRANSDERMAL | 0 refills | Status: DC

## 2017-07-30 ENCOUNTER — Ambulatory Visit (INDEPENDENT_AMBULATORY_CARE_PROVIDER_SITE_OTHER): Payer: Managed Care, Other (non HMO) | Admitting: Internal Medicine

## 2017-07-30 ENCOUNTER — Encounter: Payer: Self-pay | Admitting: Internal Medicine

## 2017-07-30 VITALS — BP 120/80 | HR 86 | Temp 97.8°F | Ht 65.5 in | Wt 257.0 lb

## 2017-07-30 DIAGNOSIS — L659 Nonscarring hair loss, unspecified: Secondary | ICD-10-CM

## 2017-07-30 DIAGNOSIS — F419 Anxiety disorder, unspecified: Secondary | ICD-10-CM

## 2017-07-30 DIAGNOSIS — M2550 Pain in unspecified joint: Secondary | ICD-10-CM

## 2017-07-30 DIAGNOSIS — Z8261 Family history of arthritis: Secondary | ICD-10-CM | POA: Diagnosis not present

## 2017-07-30 DIAGNOSIS — R232 Flushing: Secondary | ICD-10-CM | POA: Diagnosis not present

## 2017-07-30 DIAGNOSIS — F329 Major depressive disorder, single episode, unspecified: Secondary | ICD-10-CM

## 2017-07-30 DIAGNOSIS — Z0001 Encounter for general adult medical examination with abnormal findings: Secondary | ICD-10-CM

## 2017-07-30 DIAGNOSIS — M5136 Other intervertebral disc degeneration, lumbar region: Secondary | ICD-10-CM

## 2017-07-30 DIAGNOSIS — F32A Depression, unspecified: Secondary | ICD-10-CM

## 2017-07-30 DIAGNOSIS — M51369 Other intervertebral disc degeneration, lumbar region without mention of lumbar back pain or lower extremity pain: Secondary | ICD-10-CM

## 2017-07-30 LAB — LUTEINIZING HORMONE: LH: 61.06 m[IU]/mL

## 2017-07-30 LAB — COMPREHENSIVE METABOLIC PANEL
ALT: 16 U/L (ref 0–35)
AST: 16 U/L (ref 0–37)
Albumin: 4 g/dL (ref 3.5–5.2)
Alkaline Phosphatase: 57 U/L (ref 39–117)
BUN: 10 mg/dL (ref 6–23)
CO2: 27 mEq/L (ref 19–32)
Calcium: 9.4 mg/dL (ref 8.4–10.5)
Chloride: 105 mEq/L (ref 96–112)
Creatinine, Ser: 0.59 mg/dL (ref 0.40–1.20)
GFR: 137.99 mL/min (ref >60.00)
Glucose, Bld: 112 mg/dL — ABNORMAL HIGH (ref 70–99)
Potassium: 4 mEq/L (ref 3.5–5.1)
Sodium: 138 mEq/L (ref 135–145)
Total Bilirubin: 0.5 mg/dL (ref 0.2–1.2)
Total Protein: 7.7 g/dL (ref 6.0–8.3)

## 2017-07-30 LAB — SEDIMENTATION RATE: Sed Rate: 14 mm/hr (ref 0–30)

## 2017-07-30 LAB — CBC
HCT: 37.8 % (ref 36.0–46.0)
Hemoglobin: 12.8 g/dL (ref 12.0–15.0)
MCHC: 33.8 g/dL (ref 30.0–36.0)
MCV: 95.4 fl (ref 78.0–100.0)
Platelets: 190 10*3/uL (ref 150.0–400.0)
RBC: 3.96 Mil/uL (ref 3.87–5.11)
RDW: 13.1 % (ref 11.5–15.5)
WBC: 5 10*3/uL (ref 4.0–10.5)

## 2017-07-30 LAB — HEMOGLOBIN A1C: Hgb A1c MFr Bld: 5.8 % (ref 4.6–6.5)

## 2017-07-30 LAB — HIGH SENSITIVITY CRP: CRP, High Sensitivity: 6.98 mg/L — ABNORMAL HIGH (ref 0.000–5.000)

## 2017-07-30 LAB — LIPID PANEL
Cholesterol: 191 mg/dL (ref 0–200)
HDL: 53.9 mg/dL (ref >39.00)
LDL Cholesterol: 112 mg/dL — ABNORMAL HIGH (ref 0–99)
NonHDL: 136.84
Total CHOL/HDL Ratio: 4
Triglycerides: 124 mg/dL (ref 0.0–149.0)
VLDL: 24.8 mg/dL (ref 0.0–40.0)

## 2017-07-30 LAB — VITAMIN D 25 HYDROXY (VIT D DEFICIENCY, FRACTURES): VITD: 36.01 ng/mL (ref 30.00–100.00)

## 2017-07-30 LAB — FOLLICLE STIMULATING HORMONE: FSH: 21.9 m[IU]/mL

## 2017-07-30 LAB — TSH: TSH: 1.99 u[IU]/mL (ref 0.35–4.50)

## 2017-07-30 NOTE — Patient Instructions (Signed)

## 2017-07-31 ENCOUNTER — Telehealth: Payer: Self-pay | Admitting: Internal Medicine

## 2017-07-31 LAB — RHEUMATOID FACTOR: Rhuematoid fact SerPl-aCnc: <14 IU/mL (ref <14)

## 2017-07-31 LAB — ANA: Anti Nuclear Antibody(ANA): NEGATIVE

## 2017-07-31 NOTE — Telephone Encounter (Signed)
Pt faxed in form to be filled out. Placed in [place] tower.

## 2017-08-03 ENCOUNTER — Encounter: Payer: Self-pay | Admitting: Internal Medicine

## 2017-08-03 ENCOUNTER — Encounter: Payer: Self-pay | Admitting: Emergency Medicine

## 2017-08-03 ENCOUNTER — Ambulatory Visit: Payer: Self-pay | Admitting: Emergency Medicine

## 2017-08-03 VITALS — BP 128/78 | HR 93 | Temp 98.6°F

## 2017-08-03 DIAGNOSIS — J0100 Acute maxillary sinusitis, unspecified: Secondary | ICD-10-CM

## 2017-08-03 MED ORDER — PREDNISONE 20 MG PO TABS: ORAL_TABLET | ORAL | 0 refills | Status: DC

## 2017-08-03 MED ORDER — AZITHROMYCIN 250 MG PO TABS
ORAL_TABLET | ORAL | 0 refills | Status: DC
Start: 2017-08-03 — End: 2017-08-12

## 2017-08-03 NOTE — Assessment & Plan Note (Signed)
Chronic but stable on Fluoxetine and Hydroxyzine Support offered today

## 2017-08-03 NOTE — Patient Instructions (Addendum)
Excused from work on Monday, 08/03/2017.  Rest, drink plenty of fluids. Take  Z-Pak and prednisone as prescribed. Flonase OTC and Allegra-D  Follow-up if not improving in 5-7 days, sooner if worse or new symptoms.

## 2017-08-03 NOTE — Progress Notes (Signed)
Subjective:    Patient ID: [name], female    DOB: Sep 28, 1965, 52 y.o.   MRN: [id number]  HPI  Pt presents to the clinic today for her annual exam. She is also due to follow up chronic conditions.  Anxiety and Depression: Chronic secondary to social and financial strain. She is taking Fluoxetine as prescribed and Hydroxyzine as needed. She denies SI/HI.  DDD: Mainly in her lower back, also in her hands. She alternates Meloxicam and Ibuprofen, and takes Tramadol as needed. She would like a refill of the Tramadol today. She also wants to know if there is a topical medication that could help with her pain, as this is her biggest issue. She would also like to be worked up for rheumatoid arthritis. This does run in her family.  Flu: 03/2017 Tetanus: 09/2016 Pap Smear: 2014, hysterectomy Mammogram: 12/2015 Colon Screening: never Vision Screening: 10/2016, annually Dentist: yearly  Diet: She does eat meat. She consumes fruits and veggies daily. She does eat fried food. She drinks mostly water. Exercise: None  Review of Systems      Past Medical History:  Diagnosis Date  . Allergy   . DDD (degenerative disc disease), lumbar   . Depression     Current Outpatient Medications  Medication Sig Dispense Refill  . diclofenac sodium (VOLTAREN) 1 % GEL Apply 2 g topically 4 (four) times daily. 100 g 0  . fexofenadine-pseudoephedrine (ALLEGRA-D 24) 180-240 MG 24 hr tablet Take 1 tablet by mouth as needed.    [name] FLUoxetine (PROZAC) 20 MG capsule Take 2 capsules (40 mg total) by mouth daily. 180 capsule 1  . hydrOXYzine (ATARAX/VISTARIL) 10 MG tablet TAKE 1 TABLET (10 MG TOTAL) BY MOUTH DAILY AS NEEDED. 30 tablet 0  . ibuprofen (ADVIL,MOTRIN) 800 MG tablet TAKE 1 TABLET BY MOUTH 3  TIMES DAILY 180 tablet 0  . Ibuprofen-Famotidine 800-26.6 MG TABS Take 1 tablet by mouth every 12 (twelve) hours as needed. 60 tablet 2  . meloxicam (MOBIC) 7.5 MG tablet Take 1 tablet (7.5 mg total) by mouth  2 (two) times daily as needed for pain. 90 tablet 3  . traMADol (ULTRAM) 50 MG tablet Take 1 tablet (50 mg total) by mouth daily as needed. 30 tablet 0   No current facility-administered medications for this visit.     Allergies  Allergen Reactions  . Orange Concentrate [Flavoring Agent] Rash    Rash on right side of body   . Gabapentin Other (See Comments)    Hair loss  . Penicillins Other (See Comments)    Childhood allergy Has patient had a PCN reaction causing immediate rash, facial/tongue/throat swelling, SOB or lightheadedness with hypotension: Unknown Has patient had a PCN reaction causing severe rash involving mucus membranes or skin necrosis: Unknown Has patient had a PCN reaction that required hospitalization: No  Has patient had a PCN reaction occurring within the last 10 years: No  If all of the above answers are "NO", then may proceed with Cephalosporin use.     Family History  Problem Relation Age of Onset  . Hyperlipidemia Mother   . Heart disease Maternal Aunt   . Stroke Maternal Aunt   . Diabetes Maternal Aunt   . Heart disease Maternal Uncle   . Stroke Maternal Uncle   . Cancer Neg Hx     Social History   Socioeconomic History  . Marital status: Married    Spouse name: Not on file  . Number of children: Not on  file  . Years of education: Not on file  . Highest education level: Not on file  Social Needs  . Financial resource strain: Not on file  . Food insecurity - worry: Not on file  . Food insecurity - inability: Not on file  . Transportation needs - medical: Not on file  . Transportation needs - non-medical: Not on file  Occupational History  . Not on file  Tobacco Use  . Smoking status: Never Smoker  . Smokeless tobacco: Never Used  Substance and Sexual Activity  . Alcohol use: Yes    Comment: social  . Drug use: No  . Sexual activity: Yes  Other Topics Concern  . Not on file  Social History Narrative  . Not on file      Constitutional: Pt reports fatigue. Denies fever, malaise, fatigue, headache or abrupt weight changes.  HEENT: Denies eye pain, eye redness, ear pain, ringing in the ears, wax buildup, runny nose, nasal congestion, bloody nose, or sore throat. Respiratory: Denies difficulty breathing, shortness of breath, cough or sputum production.   Cardiovascular: Denies chest pain, chest tightness, palpitations or swelling in the hands or feet.  Gastrointestinal: Denies abdominal pain, bloating, constipation, diarrhea or blood in the stool.  GU: Denies urgency, frequency, pain with urination, burning sensation, blood in urine, odor or discharge. Musculoskeletal: Pt reports multiple joint pain. Denies decrease in range of motion, difficulty with gait, muscle pain or joint swelling.  Skin: Pt reports hot flashes and hair loss. Denies redness, rashes, lesions or ulcercations.  Neurological: Denies dizziness, difficulty with memory, difficulty with speech or problems with balance and coordination.  Psych: Pt has a history of anxiety and depression. Denies SI/HI.  No other specific complaints in a complete review of systems (except as listed in HPI above).  Objective:   Physical Exam  BP 120/80   Pulse 86   Temp 97.8 F (36.6 C) (Oral)   Ht 5' 5.5" (1.664 m)   Wt 257 lb (116.6 kg)   SpO2 98%   BMI 42.12 kg/m  Wt Readings from Last 3 Encounters:  07/30/17 257 lb (116.6 kg)  09/18/16 266 lb 12 oz (121 kg)  03/13/16 254 lb 8 oz (115.4 kg)    General: Appears her stated age, obese in NAD. Skin: Warm, dry and intact.  HEENT: Head: normal shape and size; Eyes: sclera white, no icterus, conjunctiva pink, PERRLA and EOMs intact; Ears: Tm's gray and intact, normal light reflex; Throat/Mouth: Teeth present, mucosa pink and moist, no exudate, lesions or ulcerations noted.  Neck:  Neck supple, trachea midline. No masses, lumps present.  Cardiovascular: Normal rate and rhythm. S1,S2 noted.  No murmur,  rubs or gallops noted. No JVD or BLE edema. No carotid bruits noted. Pulmonary/Chest: Normal effort and positive vesicular breath sounds. No respiratory distress. No wheezes, rales or ronchi noted.  Abdomen: Soft and nontender. Normal bowel sounds. No distention or masses noted. Liver, spleen and kidneys non palpable. Musculoskeletal: Pain with palpation over the right CMC. No signs of joint swelling. Pain with palpation over the lumbar spine. Strength 5/5 BUE/BLE. No difficulty with gait.  Neurological: Alert and oriented. Cranial nerves II-XII grossly intact. Coordination normal.  Psychiatric: Mood and affect normal. Behavior is normal. Judgment and thought content normal.    BMET    Component Value Date/Time   NA 138 07/30/2017 0902   NA 142 09/18/2016 0929   K 4.0 07/30/2017 0902   CL 105 07/30/2017 0902   CO2 27 07/30/2017  0902   GLUCOSE 112 (H) 07/30/2017 0902   BUN 10 07/30/2017 0902   BUN 9 09/18/2016 0929   CREATININE 0.59 07/30/2017 0902   CALCIUM 9.4 07/30/2017 0902   GFRNONAA 103 09/18/2016 0929   GFRAA 119 09/18/2016 0929    Lipid Panel     Component Value Date/Time   CHOL 191 07/30/2017 0902   CHOL 200 (H) 03/23/2017 0844   TRIG 124.0 07/30/2017 0902   HDL 53.90 07/30/2017 0902   HDL 57 03/23/2017 0844   CHOLHDL 4 07/30/2017 0902   VLDL 24.8 07/30/2017 0902   LDLCALC 112 (H) 07/30/2017 0902   LDLCALC 124 (H) 03/23/2017 0844    CBC    Component Value Date/Time   WBC 5.0 07/30/2017 0902   RBC 3.96 07/30/2017 0902   HGB 12.8 07/30/2017 0902   HGB 13.2 09/18/2016 0929   HCT 37.8 07/30/2017 0902   HCT 38.1 09/18/2016 0929   PLT 190.0 07/30/2017 0902   PLT 268 09/18/2016 0929   MCV 95.4 07/30/2017 0902   MCV 94 09/18/2016 0929   MCH 32.7 09/18/2016 0929   MCH 31.5 03/11/2016 1309   MCHC 33.8 07/30/2017 0902   RDW 13.1 07/30/2017 0902   RDW 12.8 09/18/2016 0929   LYMPHSABS 2.8 08/03/2014 1609   EOSABS 0.1 08/03/2014 1609   BASOSABS 0.0 08/03/2014 1609     Hgb A1C Lab Results  Component Value Date   HGBA1C 5.8 07/30/2017            Assessment & Plan:   Preventative Health Maintenance:  Flu and tetanus UTD She does not need pap smears Mammogram ordered, she will call to schedule She declines colonoscopy but will check with insurance regarding Cologuard- pamphlet provided Encouraged her to consume a balanced diet and exercise regimen  Advised her to see an eye doctor and dentist annually Will check CBC, CMET, Lipid, A1C and Vit D today  Hair Loss:  TSH today  Hot Flashes:  FSH and LH today Start Black Cohosh OTC  Multiple Joint Pain, Family History of RA:  Will check ESR, RF, ANA, CRP today Continue to alternate Ibuprofen and Meloxicam Advised her to take 2 tabs of Meloxicam daily when she uses this eRx for Ultram 50 mg, #30  RTC in 1 year, sooner if needed Webb Silversmith, NP

## 2017-08-03 NOTE — Assessment & Plan Note (Signed)
Encouraged weight loss and increased activity Continue Melxociam/Ibuprofen eRx for Tramadol 50 mg #30, for severe pain only

## 2017-08-03 NOTE — Progress Notes (Signed)
Subjective: URI HISTORY  [name] is a 52 y.o. female who complains of onset of cold symptoms for 7 days. Still progressively worsening. Have been using over-the-counter treatment which helps a little bit.  Review of systems:  No chills/sweats +  Fever +  Nasal congestion +  Discolored Post-nasal drainage No sinus pain/pressure Positive, sore throat +  cough, nonproductive No wheezing No chest congestion No hemoptysis No shortness of breath No pleuritic pain No itchy/red eyes Mild to moderate left earache. No drainage or decreased hearing No nausea No vomiting No abdominal pain No diarrhea No skin rashes +  Fatigue No myalgias No headache   Objective: TMs normal, except slight air-fluid level left TM. Nose, boggy turbinates, seromucoid drainage Pharynx, injected without exudate. No tonsillar enlargement Neck, supple no adenopathy Lungs, clear  Assessment: Acute maxillary sinusitis, URI, acute bronchitis. It's possible this could be bacterial, although it may have started as a viral syndrome.  Treatment options discussed, as well as risks, benefits, alternatives. Reviewed her penicillin allergy. Patient voiced understanding and agreement with the following plans: Z-Pak Allegra-D Flonase Prednisone 60 mg daily 3 days  Follow-up with your primary care doctor in 5-7 days if not improving, or sooner if symptoms become worse. Precautions discussed. Red flags discussed. Questions invited and answered. Patient voiced understanding and agreement.

## 2017-08-05 NOTE — Telephone Encounter (Signed)
Form filled out and has been faxed to number on form to [phone] and original given to pt

## 2017-08-06 ENCOUNTER — Other Ambulatory Visit: Payer: Self-pay | Admitting: Internal Medicine

## 2017-08-06 MED ORDER — TRAMADOL HCL 50 MG PO TABS: 50.0000 mg | ORAL_TABLET | Freq: Every day | ORAL | 0 refills | Status: DC | PRN

## 2017-08-06 MED ORDER — MELOXICAM 15 MG PO TABS: 15.0000 mg | ORAL_TABLET | Freq: Every day | ORAL | 5 refills | Status: DC

## 2017-08-06 MED ORDER — HYDROXYZINE HCL 10 MG PO TABS: 10.0000 mg | ORAL_TABLET | Freq: Every day | ORAL | 11 refills | Status: DC | PRN

## 2017-08-06 NOTE — Addendum Note (Signed)
Addended by: Jearld Fenton on: 08/06/2017 01:40 PM   Modules accepted: Orders

## 2017-08-06 NOTE — Addendum Note (Signed)
Addended by: Lurlean Nanny on: 08/06/2017 01:30 PM   Modules accepted: Orders

## 2017-08-07 ENCOUNTER — Encounter: Payer: Self-pay | Admitting: Internal Medicine

## 2017-08-07 MED ORDER — FEXOFENADINE-PSEUDOEPHED ER 180-240 MG PO TB24: 1.0000 | ORAL_TABLET | ORAL | 3 refills | Status: DC | PRN

## 2017-08-11 NOTE — Progress Notes (Signed)
Last pap 2014 - Hysterectomy Took antibiotics and feels some vaginal irritation. She states she has gone through menopause.

## 2017-08-12 ENCOUNTER — Ambulatory Visit (INDEPENDENT_AMBULATORY_CARE_PROVIDER_SITE_OTHER): Payer: Managed Care, Other (non HMO) | Admitting: Obstetrics and Gynecology

## 2017-08-12 ENCOUNTER — Encounter: Payer: Self-pay | Admitting: Obstetrics and Gynecology

## 2017-08-12 VITALS — BP 132/85 | HR 103 | Ht 65.5 in | Wt 257.0 lb

## 2017-08-12 DIAGNOSIS — Z01419 Encounter for gynecological examination (general) (routine) without abnormal findings: Secondary | ICD-10-CM | POA: Diagnosis not present

## 2017-08-12 DIAGNOSIS — Z113 Encounter for screening for infections with a predominantly sexual mode of transmission: Secondary | ICD-10-CM

## 2017-08-12 NOTE — Patient Instructions (Signed)
Cologard: [email or web address]  Kefir: consider taking a quarter to a half cup every night or every other night  Below is some more information about vaginal estrogen Conjugated Estrogens vaginal cream What is this medicine? CONJUGATED ESTROGENS (CON ju gate ed ESS troe jenz) are a mixture of female hormones. This cream can help relieve symptoms associated with menopause.like vaginal dryness and irritation. This medicine may be used for other purposes; ask your health care provider or pharmacist if you have questions. COMMON BRAND NAME(S): Premarin What should I tell my health care provider before I take this medicine? They need to know if you have any of these conditions: -abnormal vaginal bleeding -blood vessel disease or blood clots -breast, cervical, endometrial, or uterine cancer -dementia -diabetes -gallbladder disease -heart disease or recent heart attack -high blood pressure -high cholesterol -high level of calcium in the blood -hysterectomy -kidney disease -liver disease -migraine headaches -protein C deficiency -protein S deficiency -stroke -systemic lupus erythematosus (SLE) -tobacco smoker -an unusual or allergic reaction to estrogens other medicines, foods, dyes, or preservatives -pregnant or trying to get pregnant -breast-feeding How should I use this medicine? This medicine is for use in the vagina only. Do not take by mouth. Follow the directions on the prescription label. Use at bedtime unless otherwise directed by your doctor or health care professional. Use the special applicator supplied with the cream. Wash hands before and after use. Fill the applicator with the cream and remove from the tube. Lie on your back, part and bend your knees. Insert the applicator into the vagina and push the plunger to expel the cream into the vagina. Wash the applicator with warm soapy water and rinse well. Use exactly as directed for the complete length of time  prescribed. Do not stop using except on the advice of your doctor or health care professional. Talk to your pediatrician regarding the use of this medicine in children. Special care may be needed. A patient package insert for the product will be given with each prescription and refill. Read this sheet carefully each time. The sheet may change frequently. Overdosage: If you think you have taken too much of this medicine contact a poison control center or emergency room at once. NOTE: This medicine is only for you. Do not share this medicine with others. What if I miss a dose? If you miss a dose, use it as soon as you can. If it is almost time for your next dose, use only that dose. Do not use double or extra doses. What may interact with this medicine? Do not take this medicine with any of the following medications: -aromatase inhibitors like aminoglutethimide, anastrozole, exemestane, letrozole, testolactone This medicine may also interact with the following medications: -barbiturates used for inducing sleep or treating seizures -carbamazepine -grapefruit juice -medicines for fungal infections like itraconazole and ketoconazole -raloxifene or tamoxifen -rifabutin -rifampin -rifapentine -ritonavir -some antibiotics used to treat infections -St. John's Wort -warfarin This list may not describe all possible interactions. Give your health care provider a list of all the medicines, herbs, non-prescription drugs, or dietary supplements you use. Also tell them if you smoke, drink alcohol, or use illegal drugs. Some items may interact with your medicine. What should I watch for while using this medicine? Visit your health care professional for regular checks on your progress. You will need a regular breast and pelvic exam. You should also discuss the need for regular mammograms with your health care professional, and follow his or her guidelines. This medicine can  make your body retain fluid,  making your fingers, hands, or ankles swell. Your blood pressure can go up. Contact your doctor or health care professional if you feel you are retaining fluid. If you have any reason to think you are pregnant; stop taking this medicine at once and contact your doctor or health care professional. Tobacco smoking increases the risk of getting a blood clot or having a stroke, especially if you are more than 52 years old. You are strongly advised not to smoke. If you wear contact lenses and notice visual changes, or if the lenses begin to feel uncomfortable, consult your eye care specialist. If you are going to have elective surgery, you may need to stop taking this medicine beforehand. Consult your health care professional for advice prior to scheduling the surgery. What side effects may I notice from receiving this medicine? Side effects that you should report to your doctor or health care professional as soon as possible: -allergic reactions like skin rash, itching or hives, swelling of the face, lips, or tongue -breast tissue changes or discharge -changes in vision -chest pain -confusion, trouble speaking or understanding -dark urine -general ill feeling or flu-like symptoms -light-colored stools -nausea, vomiting -pain, swelling, warmth in the leg -right upper belly pain -severe headaches -shortness of breath -sudden numbness or weakness of the face, arm or leg -trouble walking, dizziness, loss of balance or coordination -unusual vaginal bleeding -yellowing of the eyes or skin Side effects that usually do not require medical attention (report to your doctor or health care professional if they continue or are bothersome): -hair loss -increased hunger or thirst -increased urination -symptoms of vaginal infection like itching, irritation or unusual discharge -unusually weak or tired This list may not describe all possible side effects. Call your doctor for medical advice about side  effects. You may report side effects to FDA at 1-800-FDA-1088. Where should I keep my medicine? Keep out of the reach of children. Store at room temperature between 15 and 30 degrees C (59 and 86 degrees F). Throw away any unused medicine after the expiration date. NOTE: This sheet is a summary. It may not cover all possible information. If you have questions about this medicine, talk to your doctor, pharmacist, or health care provider.  2018 Elsevier/Gold Standard (2010-09-04 09:20:36)

## 2017-08-12 NOTE — Progress Notes (Signed)
Obstetrics and Gynecology Annual Patient Evaluation  Appointment Date: 08/12/2017  OBGYN Clinic: [facility]  Primary Care Provider: Jearld Fenton  Chief Complaint:  Chief Complaint  Patient presents with  . Gynecologic Exam  vaginal irritation Skin tag  History of Present Illness: [name] is a 52 y.o. African-American [id number] (No LMP recorded. Patient has had a hysterectomy.), seen for the above chief complaint. Her past medical history is significant for h/o LAVH, endometrosis.  Patient got abx last week and since then has had vaginal irritation. No d/c, VB. Hasn't tried anything otc. Has had HF, NS for years but feels like it's tapering down.    No breast s/s, fevers, chills, chest pain, SOB, nausea, vomiting, abdominal pain, dysuria, hematuria, vaginal itching, dyspareunia, diarrhea, constipation, blood in BMs  Review of Systems: as noted in the History of Present Illness.   Past Medical History:  Past Medical History:  Diagnosis Date  . Allergy   . DDD (degenerative disc disease), lumbar   . Depression     Past Surgical History:  Past Surgical History:  Procedure Laterality Date  . ABDOMINAL HYSTERECTOMY  2008   LAVH, endometriosis ablation, mccall culdoplasty  . DIAGNOSTIC LAPAROSCOPY  2002   Hysteroscopy, diagnostic laparoscopy, extensive lysis of adhesions, endometrial ablation, RSO  . TUBAL LIGATION      Past Obstetrical History:  OB History  Gravida Para Term Preterm AB Living  5 3 3   2 3   SAB TAB Ectopic Multiple Live Births  1 1     3     # Outcome Date GA Lbr Len/2nd Weight Sex Delivery Anes PTL Lv  5 Term      Vag-Spont   LIV  4 Term      Vag-Spont   LIV  3 Term      Vag-Spont   LIV  2 SAB      SAB     1 TAB      TAB         Past Gynecological History: As per HPI. History of HRT use: No. She states she still does have her left ovary  Social History:  Social History   Socioeconomic History  .  Marital status: Married    Spouse name: Not on file  . Number of children: Not on file  . Years of education: Not on file  . Highest education level: Not on file  Social Needs  . Financial resource strain: Not on file  . Food insecurity - worry: Not on file  . Food insecurity - inability: Not on file  . Transportation needs - medical: Not on file  . Transportation needs - non-medical: Not on file  Occupational History  . Not on file  Tobacco Use  . Smoking status: Never Smoker  . Smokeless tobacco: Never Used  Substance and Sexual Activity  . Alcohol use: Yes    Comment: social  . Drug use: No  . Sexual activity: Yes    Partners: Male    Birth control/protection: Post-menopausal  Other Topics Concern  . Not on file  Social History Narrative  . Not on file    Family History:  Family History  Problem Relation Age of Onset  . Hyperlipidemia Mother   . Heart disease Maternal Aunt   . Stroke Maternal Aunt   . Diabetes Maternal Aunt   . Heart disease Maternal Uncle   . Stroke Maternal Uncle   . Cancer Neg Hx  She denies any female cancers  Health Maintenance:  Mammogram(s): None Colonoscopy: None  Medications [name] had no medications administered during this visit. Current Outpatient Medications  Medication Sig Dispense Refill  . albuterol (PROVENTIL HFA) 108 (90 Base) MCG/ACT inhaler Inhale into the lungs.    . diclofenac sodium (VOLTAREN) 1 % GEL Apply 2 g topically 4 (four) times daily. 100 g 0  . fexofenadine-pseudoephedrine (ALLEGRA-D 24) 180-240 MG 24 hr tablet Take 1 tablet by mouth as needed. 90 tablet 3  . FLUoxetine (PROZAC) 20 MG capsule Take 2 capsules (40 mg total) by mouth daily. 180 capsule 1  . hydrOXYzine (ATARAX/VISTARIL) 10 MG tablet Take 1 tablet (10 mg total) by mouth daily as needed. 30 tablet 11  . ibuprofen (ADVIL,MOTRIN) 800 MG tablet TAKE 1 TABLET BY MOUTH 3  TIMES DAILY 180 tablet 0  . meloxicam (MOBIC) 15 MG tablet Take 1  tablet (15 mg total) by mouth daily. 30 tablet 5  . methocarbamol (ROBAXIN) 500 MG tablet Take by mouth.    . traMADol (ULTRAM) 50 MG tablet Take 1 tablet (50 mg total) by mouth daily as needed. 30 tablet 0   No current facility-administered medications for this visit.     Allergies Orange concentrate [flavoring agent]; Gabapentin; and Penicillins   Physical Exam:  BP 132/85   Pulse (!) 103   Ht 5' 5.5" (1.664 m)   Wt 257 lb (116.6 kg)   BMI 42.12 kg/m  Body mass index is 42.12 kg/m. General appearance: Well nourished, well developed female in no acute distress.  Neck:  Supple, normal appearance, and no thyromegaly  Cardiovascular: normal s1 and s2.  No murmurs, rubs or gallops. Respiratory:  Clear to auscultation bilateral. Normal respiratory effort Abdomen: positive bowel sounds and no masses, hernias; diffusely non tender to palpation, non distended Breasts: breasts appear normal, no suspicious masses, no skin or nipple changes or axillary nodes, normal palpation. Neuro/Psych:  Normal mood and affect.  Skin:  Warm and dry.  Lymphatic:  No inguinal lymphadenopathy.   Pelvic exam: is not limited by body habitus EGBUS: within normal limits, Vagina: within normal limits and with no blood or discharge in the vault, well healed cuff. Cervix: surgically absent, discharge or lesions. Uterus:  Surgically absent and non tender and Adnexa:  normal adnexa and no mass, fullness, tenderness Rectovaginal: deferred  Small subcm skin tab on left gluteal cleft  Laboratory: none  Radiology: none  Assessment: pt doing well  Plan:  1. Encntr for gyn exam (general) (routine) w/o abn findings Routine care. D/w pt re: doing lubricants and briefly d/w her re: vaginal estrogen if refractory s/s with lubricants and with BV, yeast infections. F/u Swab and d/w her re: importance screening with mammo and colonoscopy, as pt has been reluctant to get them done. - MM Digital Screening; Future - Urine  Culture - Cervicovaginal ancillary only  RTC 1 year, PRN  Durene Romans MD Attending Center for [facility])

## 2017-08-13 ENCOUNTER — Other Ambulatory Visit: Payer: Self-pay

## 2017-08-13 ENCOUNTER — Encounter: Payer: Self-pay | Admitting: Internal Medicine

## 2017-08-13 ENCOUNTER — Encounter (INDEPENDENT_AMBULATORY_CARE_PROVIDER_SITE_OTHER): Payer: Self-pay

## 2017-08-13 LAB — CERVICOVAGINAL ANCILLARY ONLY
Bacterial vaginitis: NEGATIVE
Candida vaginitis: POSITIVE — AB
Chlamydia: NEGATIVE
Neisseria Gonorrhea: NEGATIVE
Trichomonas: NEGATIVE

## 2017-08-13 LAB — URINE CULTURE

## 2017-08-13 MED ORDER — FLUCONAZOLE 150 MG PO TABS: 150.0000 mg | ORAL_TABLET | Freq: Once | ORAL | 0 refills | Status: AC

## 2017-08-13 NOTE — Telephone Encounter (Signed)
Diflucan has been called into [facility].

## 2017-09-17 ENCOUNTER — Encounter: Payer: Self-pay | Admitting: Internal Medicine

## 2017-09-17 MED ORDER — IBUPROFEN 800 MG PO TABS: 800.0000 mg | ORAL_TABLET | Freq: Three times a day (TID) | ORAL | 0 refills | Status: DC

## 2017-10-21 ENCOUNTER — Encounter: Payer: Self-pay | Admitting: Internal Medicine

## 2017-10-23 ENCOUNTER — Other Ambulatory Visit: Payer: Self-pay | Admitting: Internal Medicine

## 2017-10-23 MED ORDER — DICLOFENAC SODIUM 1 % TD GEL: 2.0000 g | Freq: Four times a day (QID) | TRANSDERMAL | 0 refills | Status: DC

## 2017-10-23 MED ORDER — TRAMADOL HCL 50 MG PO TABS: 50.0000 mg | ORAL_TABLET | Freq: Every day | ORAL | 0 refills | Status: DC | PRN

## 2017-10-23 NOTE — Telephone Encounter (Signed)
Tramadol last filled 08/06/17... Please advise

## 2017-12-18 ENCOUNTER — Other Ambulatory Visit: Payer: Self-pay | Admitting: Internal Medicine

## 2017-12-18 ENCOUNTER — Encounter: Payer: Self-pay | Admitting: Internal Medicine

## 2017-12-18 NOTE — Telephone Encounter (Signed)
Name of Medication: Tramadol & Ibuprofen 800 mg Name of Pharmacy: [facility] Last Fill or Written Date and Quantity:  Tramadol 10/23/2017 for #30 with no refills/Ibuprofen 09/17/2017 for #180 with no refills Last Office Visit and Type: 07/30/2017 CPE Next Office Visit and Type: No future appointments Last Controlled Substance Agreement Date: 08/28/2014 Last UDS: 08/16/2014

## 2017-12-18 NOTE — Telephone Encounter (Signed)
Duplicate Request.

## 2017-12-19 MED ORDER — TRAMADOL HCL 50 MG PO TABS: 50.0000 mg | ORAL_TABLET | Freq: Every day | ORAL | 0 refills | Status: DC | PRN

## 2017-12-19 MED ORDER — IBUPROFEN 800 MG PO TABS: 800.0000 mg | ORAL_TABLET | Freq: Three times a day (TID) | ORAL | 0 refills | Status: DC

## 2018-01-15 ENCOUNTER — Other Ambulatory Visit: Payer: Self-pay | Admitting: Internal Medicine

## 2018-01-15 MED ORDER — TRAMADOL HCL 50 MG PO TABS: 50.0000 mg | ORAL_TABLET | Freq: Every day | ORAL | 0 refills | Status: DC | PRN

## 2018-01-15 NOTE — Telephone Encounter (Signed)
Albuterol inhaler refill Last Refilled by historical provider Last OV: 07/30/17 PCP: [place]: [facility] [place]

## 2018-01-15 NOTE — Telephone Encounter (Signed)
Copied from [place] [id number]. Topic: Quick Communication - Rx Refill/Question >> Jan 15, 2018  4:09 PM Percell Belt A wrote: Medication: albuterol (PROVENTIL HFA) 108 (90 Base) MCG/ACT inhaler [[id number]]    Has the patient contacted their pharmacy? Yes  (Agent: If no, request that the patient contact the pharmacy for the refill.) (Agent: If yes, when and what did the pharmacy advise?)  Preferred Pharmacy (with phone number or street name): [facility]  Agent: Please be advised that RX refills may take up to 3 business days. We ask that you follow-up with your pharmacy.

## 2018-01-15 NOTE — Telephone Encounter (Signed)
Name of Medication: Tramadol 50mg  Name of Pharmacy: [facility] Last Fill or Written Date and Quantity: #30 12/19/2017 Last Office Visit and Type: CPE 07/2017 Next Office Visit and Type: none  Note to pharmacy TBF on or after 01/19/18

## 2018-01-18 MED ORDER — ALBUTEROL SULFATE HFA 108 (90 BASE) MCG/ACT IN AERS: 2.0000 | INHALATION_SPRAY | Freq: Four times a day (QID) | RESPIRATORY_TRACT | 0 refills | Status: DC | PRN

## 2018-02-17 ENCOUNTER — Encounter: Payer: Self-pay | Admitting: Internal Medicine

## 2018-03-08 ENCOUNTER — Other Ambulatory Visit: Payer: Self-pay | Admitting: Internal Medicine

## 2018-03-09 MED ORDER — TRAMADOL HCL 50 MG PO TABS: 50.0000 mg | ORAL_TABLET | Freq: Every day | ORAL | 0 refills | Status: DC | PRN

## 2018-03-09 NOTE — Telephone Encounter (Signed)
Last filled 01/19/2018... Please advise

## 2018-03-15 ENCOUNTER — Other Ambulatory Visit: Payer: Self-pay | Admitting: Internal Medicine

## 2018-04-01 ENCOUNTER — Other Ambulatory Visit: Payer: Self-pay | Admitting: Internal Medicine

## 2018-04-09 ENCOUNTER — Encounter: Payer: Self-pay | Admitting: Internal Medicine

## 2018-04-21 ENCOUNTER — Other Ambulatory Visit: Payer: Self-pay | Admitting: Internal Medicine

## 2018-04-21 MED ORDER — TRAMADOL HCL 50 MG PO TABS: 50.0000 mg | ORAL_TABLET | Freq: Every day | ORAL | 0 refills | Status: DC | PRN

## 2018-04-21 MED ORDER — DICLOFENAC SODIUM 1 % TD GEL: 2.0000 g | Freq: Four times a day (QID) | TRANSDERMAL | 0 refills | Status: DC

## 2018-04-21 NOTE — Telephone Encounter (Signed)
Last filled 03/09/2018... Please advise

## 2018-04-21 NOTE — Telephone Encounter (Signed)
Last office visit 08/09/2014 for CPE.  No future appointments.  Last refilled 10/23/2017 for 100 g with no refills.

## 2018-04-21 NOTE — Telephone Encounter (Signed)
Last office visit 07/30/2017 for CPE.  Last refilled 03/09/2018 for #30 with no refills.  No UDS/Contract on file.  No future appointments.

## 2018-05-31 ENCOUNTER — Encounter: Payer: Self-pay | Admitting: Internal Medicine

## 2018-05-31 ENCOUNTER — Ambulatory Visit: Payer: 59 | Admitting: Internal Medicine

## 2018-05-31 VITALS — BP 128/82 | HR 86 | Temp 98.0°F | Wt 256.0 lb

## 2018-05-31 DIAGNOSIS — M25512 Pain in left shoulder: Secondary | ICD-10-CM | POA: Diagnosis not present

## 2018-05-31 DIAGNOSIS — G8929 Other chronic pain: Secondary | ICD-10-CM

## 2018-05-31 MED ORDER — PREDNISONE 10 MG PO TABS: ORAL_TABLET | ORAL | 0 refills | Status: DC

## 2018-05-31 NOTE — Patient Instructions (Signed)
Bursitis Bursitis is when the fluid-filled sac (bursa) that covers and protects a joint is swollen (inflamed). Bursitis is most common near joints, especially the knees, elbows, hips, and shoulders. Follow these instructions at home:  Take medicines only as told by your doctor.  If you were prescribed an antibiotic medicine, finish it all even if you start to feel better.  Rest the affected area as told by your doctor. ? Keep the area raised up. ? Avoid doing things that make the pain worse.  Apply ice to the injured area: ? Place ice in a plastic bag. ? Place a towel between your skin and the bag. ? Leave the ice on for 20 minutes, 2-3 times a day.  Use splints, braces, pads, or walking aids as told by your doctor.  Keep all follow-up visits as told by your doctor. This is important. Contact a doctor if:  You have more pain with home care.  You have a fever.  You have chills. This information is not intended to replace advice given to you by your health care provider. Make sure you discuss any questions you have with your health care provider. Document Released: 11/20/2009 Document Revised: 11/08/2015 Document Reviewed: 08/22/2013 Elsevier Interactive Patient Education  2018 [employer]vier Inc[employer]  

## 2018-05-31 NOTE — Progress Notes (Signed)
Subjective:    Patient ID: [name], female    DOB: September 20, 1965, 52 y.o.   MRN: [id number]  HPI  Pt reports left shoulder pain. She noticed this 6 months ago, but worse in the last 2-3 weeks. She describes the pain as sore and achy. The pain radiates into her left shoulder blade and down her left arm. She denies numbness, tingling or weakness. She denies any injury to the area. She has been taking Ibuprofen and Tramadol as needed with some relief.  Review of Systems  Past Medical History:  Diagnosis Date  . Allergy   . DDD (degenerative disc disease), lumbar   . Depression     Current Outpatient Medications  Medication Sig Dispense Refill  . albuterol (PROVENTIL HFA;VENTOLIN HFA) 108 (90 Base) MCG/ACT inhaler Inhale 2 puffs into the lungs every 6 (six) hours as needed for wheezing or shortness of breath. 1 Inhaler 0  . diclofenac sodium (VOLTAREN) 1 % GEL Apply 2 g topically 4 (four) times daily. 100 g 0  . fexofenadine-pseudoephedrine (ALLEGRA-D 24) 180-240 MG 24 hr tablet Take 1 tablet by mouth as needed. 90 tablet 3  . FLUoxetine (PROZAC) 20 MG capsule Take 2 capsules (40 mg total) by mouth daily. 180 capsule 1  . hydrOXYzine (ATARAX/VISTARIL) 10 MG tablet Take 1 tablet (10 mg total) by mouth daily as needed. 30 tablet 11  . ibuprofen (ADVIL,MOTRIN) 800 MG tablet TAKE 1 TABLET BY MOUTH THREE TIMES A DAY 90 tablet 1  . meloxicam (MOBIC) 15 MG tablet TAKE 1 TABLET BY MOUTH EVERY DAY 30 tablet 2  . methocarbamol (ROBAXIN) 500 MG tablet Take by mouth.    . traMADol (ULTRAM) 50 MG tablet Take 1 tablet (50 mg total) by mouth daily as needed. 30 tablet 0   No current facility-administered medications for this visit.     Allergies  Allergen Reactions  . Orange Concentrate [Flavoring Agent] Rash    Rash on right side of body   . Gabapentin Other (See Comments)    Hair loss  . Penicillins Other (See Comments)    Childhood allergy Has patient had a PCN reaction causing  immediate rash, facial/tongue/throat swelling, SOB or lightheadedness with hypotension: Unknown Has patient had a PCN reaction causing severe rash involving mucus membranes or skin necrosis: Unknown Has patient had a PCN reaction that required hospitalization: No  Has patient had a PCN reaction occurring within the last 10 years: No  If all of the above answers are "NO", then may proceed with Cephalosporin use.     Family History  Problem Relation Age of Onset  . Hyperlipidemia Mother   . Heart disease Maternal Aunt   . Stroke Maternal Aunt   . Diabetes Maternal Aunt   . Heart disease Maternal Uncle   . Stroke Maternal Uncle   . Cancer Neg Hx     Social History   Socioeconomic History  . Marital status: Married    Spouse name: Not on file  . Number of children: Not on file  . Years of education: Not on file  . Highest education level: Not on file  Occupational History  . Not on file  Social Needs  . Financial resource strain: Not on file  . Food insecurity:    Worry: Not on file    Inability: Not on file  . Transportation needs:    Medical: Not on file    Non-medical: Not on file  Tobacco Use  . Smoking status: Never  Smoker  . Smokeless tobacco: Never Used  Substance and Sexual Activity  . Alcohol use: Yes    Comment: social  . Drug use: No  . Sexual activity: Yes    Partners: Male    Birth control/protection: Post-menopausal  Lifestyle  . Physical activity:    Days per week: Not on file    Minutes per session: Not on file  . Stress: Not on file  Relationships  . Social connections:    Talks on phone: Not on file    Gets together: Not on file    Attends religious service: Not on file    Active member of club or organization: Not on file    Attends meetings of clubs or organizations: Not on file    Relationship status: Not on file  . Intimate partner violence:    Fear of current or ex partner: Not on file    Emotionally abused: Not on file    Physically  abused: Not on file    Forced sexual activity: Not on file  Other Topics Concern  . Not on file  Social History Narrative  . Not on file     Constitutional: Denies fever, malaise, fatigue, headache or abrupt weight changes.  Musculoskeletal: Pt reports left shoulder pain. Denies decrease in range of motion, difficulty with gait, muscle pain or joint swelling.   No other specific complaints in a complete review of systems (except as listed in HPI above).     Objective:   Physical Exam   BP 128/82   Pulse 86   Temp 98 F (36.7 C) (Oral)   Wt 256 lb (116.1 kg)   SpO2 98%   BMI 41.95 kg/m  Wt Readings from Last 3 Encounters:  05/31/18 256 lb (116.1 kg)  08/12/17 257 lb (116.6 kg)  07/30/17 257 lb (116.6 kg)    General: Appears her stated age, well developed, well nourished in NAD. Cardiovascular: Radial pulses 2+ bilaterally. Musculoskeletal: Pain with external rotation. Normal internal rotation of the left shoulder. Pain with palpation over the left anterior biceps tendon and left subacromial bursitis. Negative drop can test on the left. Strength 5/5 BUE/BLE. Hand grips equal. Neurological: Alert and oriented. Sensation intact to BUE.  BMET    Component Value Date/Time   NA 138 07/30/2017 0902   NA 142 09/18/2016 0929   K 4.0 07/30/2017 0902   CL 105 07/30/2017 0902   CO2 27 07/30/2017 0902   GLUCOSE 112 (H) 07/30/2017 0902   BUN 10 07/30/2017 0902   BUN 9 09/18/2016 0929   CREATININE 0.59 07/30/2017 0902   CALCIUM 9.4 07/30/2017 0902   GFRNONAA 103 09/18/2016 0929   GFRAA 119 09/18/2016 0929    Lipid Panel     Component Value Date/Time   CHOL 191 07/30/2017 0902   CHOL 200 (H) 03/23/2017 0844   TRIG 124.0 07/30/2017 0902   HDL 53.90 07/30/2017 0902   HDL 57 03/23/2017 0844   CHOLHDL 4 07/30/2017 0902   VLDL 24.8 07/30/2017 0902   LDLCALC 112 (H) 07/30/2017 0902   LDLCALC 124 (H) 03/23/2017 0844    CBC    Component Value Date/Time   WBC 5.0  07/30/2017 0902   RBC 3.96 07/30/2017 0902   HGB 12.8 07/30/2017 0902   HGB 13.2 09/18/2016 0929   HCT 37.8 07/30/2017 0902   HCT 38.1 09/18/2016 0929   PLT 190.0 07/30/2017 0902   PLT 268 09/18/2016 0929   MCV 95.4 07/30/2017 0902   MCV  94 09/18/2016 0929   MCH 32.7 09/18/2016 0929   MCH 31.5 03/11/2016 1309   MCHC 33.8 07/30/2017 0902   RDW 13.1 07/30/2017 0902   RDW 12.8 09/18/2016 0929   LYMPHSABS 2.8 08/03/2014 1609   EOSABS 0.1 08/03/2014 1609   BASOSABS 0.0 08/03/2014 1609    Hgb A1C Lab Results  Component Value Date   HGBA1C 5.8 07/30/2017           Assessment & Plan:   Left Shoulder Pain:  Likely tendonitis, subacromial bursitis eRx for Pred Taper x 9 days- avoid OTC NSAID's Shoulder exercises given Ice for 10 mins 2 x day No indication for xray at this time  Return precautions discussed Webb Silversmith, NP

## 2018-06-01 ENCOUNTER — Other Ambulatory Visit: Payer: Self-pay | Admitting: Internal Medicine

## 2018-06-02 ENCOUNTER — Encounter: Payer: Self-pay | Admitting: Internal Medicine

## 2018-06-02 NOTE — Telephone Encounter (Signed)
Last filled 04/21/2018... please advise

## 2018-06-04 ENCOUNTER — Encounter: Payer: Self-pay | Admitting: Internal Medicine

## 2018-06-21 ENCOUNTER — Encounter: Payer: Self-pay | Admitting: Internal Medicine

## 2018-06-22 ENCOUNTER — Encounter: Payer: Self-pay | Admitting: Internal Medicine

## 2018-06-23 MED ORDER — IBUPROFEN-FAMOTIDINE 800-26.6 MG PO TABS: 1.0000 | ORAL_TABLET | Freq: Three times a day (TID) | ORAL | 1 refills | Status: DC | PRN

## 2018-06-23 NOTE — Telephone Encounter (Signed)
Please let her know I sent the RX

## 2018-06-23 NOTE — Telephone Encounter (Signed)
[name] is currently out of office and pt is contacting office regarding medication refill. Duexis is not on pts current medication list, but pt states she recently d/c in December. pls advise

## 2018-06-25 ENCOUNTER — Other Ambulatory Visit: Payer: Self-pay | Admitting: Internal Medicine

## 2018-07-07 ENCOUNTER — Telehealth: Payer: Self-pay | Admitting: Internal Medicine

## 2018-07-07 NOTE — Telephone Encounter (Signed)
[place] called office to get the diagnosis for the Duexis medication. They are also needing to verify what other medications the pt has tried to take.

## 2018-07-09 MED ORDER — IBUPROFEN-FAMOTIDINE 800-26.6 MG PO TABS: 1.0000 | ORAL_TABLET | Freq: Three times a day (TID) | ORAL | 1 refills | Status: DC | PRN

## 2018-07-09 NOTE — Addendum Note (Signed)
Addended by: Lurlean Nanny on: 07/09/2018 09:22 AM   Modules accepted: Orders

## 2018-07-09 NOTE — Telephone Encounter (Signed)
Rx resent with requested information attached

## 2018-07-21 ENCOUNTER — Encounter: Payer: Self-pay | Admitting: Internal Medicine

## 2018-07-22 ENCOUNTER — Other Ambulatory Visit: Payer: Self-pay | Admitting: Internal Medicine

## 2018-07-26 ENCOUNTER — Encounter: Payer: Self-pay | Admitting: Internal Medicine

## 2018-08-02 ENCOUNTER — Ambulatory Visit: Payer: 59 | Admitting: Internal Medicine

## 2018-08-02 VITALS — BP 128/84 | HR 90 | Temp 98.0°F | Wt 263.0 lb

## 2018-08-02 DIAGNOSIS — B9789 Other viral agents as the cause of diseases classified elsewhere: Secondary | ICD-10-CM | POA: Diagnosis not present

## 2018-08-02 DIAGNOSIS — J329 Chronic sinusitis, unspecified: Secondary | ICD-10-CM | POA: Diagnosis not present

## 2018-08-02 MED ORDER — FLUTICASONE PROPIONATE 50 MCG/ACT NA SUSP: 2.0000 | Freq: Every day | NASAL | 6 refills | Status: DC

## 2018-08-02 NOTE — Patient Instructions (Signed)

## 2018-08-05 ENCOUNTER — Encounter: Payer: Self-pay | Admitting: Internal Medicine

## 2018-08-05 MED ORDER — METHYLPREDNISOLONE ACETATE 80 MG/ML IJ SUSP
80.0000 mg | Freq: Once | INTRAMUSCULAR | Status: AC
  Administered 2018-08-02: 80 mg via INTRAMUSCULAR

## 2018-08-05 NOTE — Progress Notes (Signed)
HPI  Pt presents to the clinic today with c/o headache, facial pain and pressure, nasal congestion and ear pain. She reports this started 1 week ago. The headache is located in the forehead. She describes the pain as pressure. She denies dizziness or visual changes. She is blowing clear, blood tinged mucous out of her nose. She describes the ear pain as pressure without drainage or loss of hearing. She denies fever, chills or body aches. She has tried Allegra D, Ibuprofen, Dayquil and Tramadol with minimal relief. She has a history of allergies. She has had sick contacts.  Review of Systems     Past Medical History:  Diagnosis Date  . Allergy   . DDD (degenerative disc disease), lumbar   . Depression     Family History  Problem Relation Age of Onset  . Hyperlipidemia Mother   . Heart disease Maternal Aunt   . Stroke Maternal Aunt   . Diabetes Maternal Aunt   . Heart disease Maternal Uncle   . Stroke Maternal Uncle   . Cancer Neg Hx     Social History   Socioeconomic History  . Marital status: Married    Spouse name: Not on file  . Number of children: Not on file  . Years of education: Not on file  . Highest education level: Not on file  Occupational History  . Not on file  Social Needs  . Financial resource strain: Not on file  . Food insecurity:    Worry: Not on file    Inability: Not on file  . Transportation needs:    Medical: Not on file    Non-medical: Not on file  Tobacco Use  . Smoking status: Never Smoker  . Smokeless tobacco: Never Used  Substance and Sexual Activity  . Alcohol use: Yes    Comment: social  . Drug use: No  . Sexual activity: Yes    Partners: Male    Birth control/protection: Post-menopausal  Lifestyle  . Physical activity:    Days per week: Not on file    Minutes per session: Not on file  . Stress: Not on file  Relationships  . Social connections:    Talks on phone: Not on file    Gets together: Not on file    Attends religious  service: Not on file    Active member of club or organization: Not on file    Attends meetings of clubs or organizations: Not on file    Relationship status: Not on file  . Intimate partner violence:    Fear of current or ex partner: Not on file    Emotionally abused: Not on file    Physically abused: Not on file    Forced sexual activity: Not on file  Other Topics Concern  . Not on file  Social History Narrative  . Not on file    Allergies  Allergen Reactions  . Orange Concentrate [Flavoring Agent] Rash    Rash on right side of body   . Gabapentin Other (See Comments)    Hair loss  . Penicillins Other (See Comments)    Childhood allergy Has patient had a PCN reaction causing immediate rash, facial/tongue/throat swelling, SOB or lightheadedness with hypotension: Unknown Has patient had a PCN reaction causing severe rash involving mucus membranes or skin necrosis: Unknown Has patient had a PCN reaction that required hospitalization: No  Has patient had a PCN reaction occurring within the last 10 years: No  If all of the above  answers are "NO", then may proceed with Cephalosporin use.      Constitutional: Positive headache. Denies fatigue, fever or abrupt weight changes.  HEENT:  Positive facial pain, ear pain, nasal congestion. Denies eye redness,  ringing in the ears, wax buildup, runny nose or sore throat. Respiratory: Denies cough, difficulty breathing or shortness of breath.  Cardiovascular: Denies chest pain, chest tightness, palpitations or swelling in the hands or feet.   No other specific complaints in a complete review of systems (except as listed in HPI above).  Objective:   BP 128/84   Pulse 90   Temp 98 F (36.7 C) (Oral)   Wt 263 lb (119.3 kg)   SpO2 97%   BMI 43.10 kg/m   General: Appears herstated age, obese, in NAD. HEENT: Head: normal shape and size, maxillary sinus tenderness noted;  Ears: Tm's gray and intact, normal light reflex; Nose: mucosa  boggy and moist, septum midline; Throat/Mouth: + PND. Teeth present, mucosa erythematous and moist, no exudate noted, no lesions or ulcerations noted.  Neck:  No adenopathy noted.  Cardiovascular: Normal rate and rhythm.  Pulmonary/Chest: Normal effort and positive vesicular breath sounds. No respiratory distress. No wheezes, rales or ronchi noted.       Assessment & Plan:   Viral Sinusitis  Can use a Neti Pot which can be purchased from your local drug store. Flonase 2 sprays each nostril for 3 days and then as needed. Continue Allegra 80 mg Depo IM today No indication for abx at this time  RTC as needed or if symptoms persist. Webb Silversmith, NP

## 2018-08-10 ENCOUNTER — Encounter: Payer: Self-pay | Admitting: Internal Medicine

## 2018-08-10 DIAGNOSIS — G8929 Other chronic pain: Secondary | ICD-10-CM

## 2018-08-10 DIAGNOSIS — M545 Low back pain: Principal | ICD-10-CM

## 2018-08-20 ENCOUNTER — Encounter: Payer: Self-pay | Admitting: Internal Medicine

## 2018-08-20 ENCOUNTER — Other Ambulatory Visit: Payer: Self-pay | Admitting: Internal Medicine

## 2018-08-20 MED ORDER — TRAMADOL HCL 50 MG PO TABS: 50.0000 mg | ORAL_TABLET | Freq: Every day | ORAL | 0 refills | Status: DC | PRN

## 2018-08-20 NOTE — Addendum Note (Signed)
Addended by: Jearld Fenton on: 08/20/2018 04:57 PM   Modules accepted: Orders

## 2018-08-20 NOTE — Telephone Encounter (Signed)
Last filled 06/02/2018... please advise

## 2018-08-23 MED ORDER — METHOCARBAMOL 500 MG PO TABS: 500.0000 mg | ORAL_TABLET | Freq: Every evening | ORAL | 0 refills | Status: DC | PRN

## 2018-08-24 ENCOUNTER — Other Ambulatory Visit: Payer: Self-pay | Admitting: Internal Medicine

## 2018-08-24 ENCOUNTER — Encounter: Payer: Self-pay | Admitting: Internal Medicine

## 2018-08-26 MED ORDER — HYDROXYZINE HCL 10 MG PO TABS: 10.0000 mg | ORAL_TABLET | Freq: Every day | ORAL | 2 refills | Status: DC | PRN

## 2018-09-06 ENCOUNTER — Encounter: Payer: Self-pay | Admitting: Internal Medicine

## 2018-09-06 ENCOUNTER — Telehealth: Payer: Self-pay

## 2018-09-06 NOTE — Telephone Encounter (Signed)
Called pt requesting her to r/s CPE scheduled for Thurs for June

## 2018-09-09 ENCOUNTER — Ambulatory Visit: Payer: Self-pay

## 2018-09-09 ENCOUNTER — Encounter: Payer: 59 | Admitting: Internal Medicine

## 2018-09-14 ENCOUNTER — Other Ambulatory Visit: Payer: Self-pay | Admitting: Internal Medicine

## 2018-09-17 ENCOUNTER — Other Ambulatory Visit: Payer: Self-pay | Admitting: Internal Medicine

## 2018-09-18 ENCOUNTER — Other Ambulatory Visit: Payer: Self-pay | Admitting: Internal Medicine

## 2018-10-06 ENCOUNTER — Encounter: Payer: Self-pay | Admitting: Pain Medicine

## 2018-10-06 ENCOUNTER — Telehealth: Payer: Self-pay

## 2018-10-06 ENCOUNTER — Other Ambulatory Visit: Payer: Self-pay

## 2018-10-07 ENCOUNTER — Other Ambulatory Visit: Payer: Self-pay

## 2018-10-07 ENCOUNTER — Ambulatory Visit: Payer: 59 | Attending: Pain Medicine | Admitting: Pain Medicine

## 2018-10-07 VITALS — Ht 65.5 in | Wt 263.0 lb

## 2018-10-07 DIAGNOSIS — M545 Low back pain, unspecified: Secondary | ICD-10-CM | POA: Insufficient documentation

## 2018-10-07 DIAGNOSIS — M51369 Other intervertebral disc degeneration, lumbar region without mention of lumbar back pain or lower extremity pain: Secondary | ICD-10-CM

## 2018-10-07 DIAGNOSIS — M5030 Other cervical disc degeneration, unspecified cervical region: Secondary | ICD-10-CM

## 2018-10-07 DIAGNOSIS — Z8781 Personal history of (healed) traumatic fracture: Secondary | ICD-10-CM | POA: Insufficient documentation

## 2018-10-07 DIAGNOSIS — M25531 Pain in right wrist: Secondary | ICD-10-CM

## 2018-10-07 DIAGNOSIS — R519 Headache, unspecified: Secondary | ICD-10-CM | POA: Insufficient documentation

## 2018-10-07 DIAGNOSIS — M79605 Pain in left leg: Secondary | ICD-10-CM

## 2018-10-07 DIAGNOSIS — M4310 Spondylolisthesis, site unspecified: Secondary | ICD-10-CM | POA: Insufficient documentation

## 2018-10-07 DIAGNOSIS — M79604 Pain in right leg: Secondary | ICD-10-CM

## 2018-10-07 DIAGNOSIS — M25512 Pain in left shoulder: Secondary | ICD-10-CM

## 2018-10-07 DIAGNOSIS — M79671 Pain in right foot: Secondary | ICD-10-CM

## 2018-10-07 DIAGNOSIS — M25551 Pain in right hip: Secondary | ICD-10-CM

## 2018-10-07 DIAGNOSIS — R0781 Pleurodynia: Secondary | ICD-10-CM

## 2018-10-07 DIAGNOSIS — M5136 Other intervertebral disc degeneration, lumbar region: Secondary | ICD-10-CM | POA: Diagnosis not present

## 2018-10-07 DIAGNOSIS — R51 Headache: Secondary | ICD-10-CM

## 2018-10-07 DIAGNOSIS — G894 Chronic pain syndrome: Secondary | ICD-10-CM | POA: Insufficient documentation

## 2018-10-07 DIAGNOSIS — M47816 Spondylosis without myelopathy or radiculopathy, lumbar region: Secondary | ICD-10-CM | POA: Diagnosis not present

## 2018-10-07 DIAGNOSIS — G8929 Other chronic pain: Secondary | ICD-10-CM | POA: Insufficient documentation

## 2018-10-07 DIAGNOSIS — M79641 Pain in right hand: Secondary | ICD-10-CM | POA: Insufficient documentation

## 2018-10-07 DIAGNOSIS — R0789 Other chest pain: Secondary | ICD-10-CM | POA: Insufficient documentation

## 2018-10-07 DIAGNOSIS — M79642 Pain in left hand: Secondary | ICD-10-CM

## 2018-10-07 DIAGNOSIS — M549 Dorsalgia, unspecified: Secondary | ICD-10-CM

## 2018-10-07 DIAGNOSIS — G4486 Cervicogenic headache: Secondary | ICD-10-CM | POA: Insufficient documentation

## 2018-10-07 DIAGNOSIS — M542 Cervicalgia: Secondary | ICD-10-CM

## 2018-10-07 DIAGNOSIS — M79672 Pain in left foot: Secondary | ICD-10-CM

## 2018-10-07 DIAGNOSIS — Z789 Other specified health status: Secondary | ICD-10-CM | POA: Insufficient documentation

## 2018-10-07 DIAGNOSIS — M899 Disorder of bone, unspecified: Secondary | ICD-10-CM | POA: Insufficient documentation

## 2018-10-07 DIAGNOSIS — Z79899 Other long term (current) drug therapy: Secondary | ICD-10-CM | POA: Insufficient documentation

## 2018-10-07 DIAGNOSIS — M25552 Pain in left hip: Secondary | ICD-10-CM

## 2018-10-07 DIAGNOSIS — M25532 Pain in left wrist: Secondary | ICD-10-CM

## 2018-10-07 NOTE — Patient Instructions (Signed)
____________________________________________________________________________________________  Medication Rules  Purpose: To inform patients, and their family members, of our rules and regulations.  Applies to: All patients receiving prescriptions (written or electronic).  Pharmacy of record: Pharmacy where electronic prescriptions will be sent. If written prescriptions are taken to a different pharmacy, please inform the nursing staff. The pharmacy listed in the electronic medical record should be the one where you would like electronic prescriptions to be sent.  Electronic prescriptions: In compliance with the [place] Strengthen Opioid Misuse Prevention (STOP) Act of 2017 (Session Law 2017-74/H243), effective June 16, 2018, all controlled substances must be electronically prescribed. Calling prescriptions to the pharmacy will cease to exist.  Prescription refills: Only during scheduled appointments. Applies to all prescriptions.  NOTE: The following applies primarily to controlled substances (Opioid* Pain Medications).   Patient's responsibilities: 1. Pain Pills: Bring all pain pills to every appointment (except for procedure appointments). 2. Pill Bottles: Bring pills in original pharmacy bottle. Always bring the newest bottle. Bring bottle, even if empty. 3. Medication refills: You are responsible for knowing and keeping track of what medications you take and those you need refilled. The day before your appointment: write a list of all prescriptions that need to be refilled. The day of the appointment: give the list to the admitting nurse. Prescriptions will be written only during appointments. No prescriptions will be written on procedure days. If you forget a medication: it will not be "Called in", "Faxed", or "electronically sent". You will need to get another appointment to get these prescribed. No early refills. Do not call asking to have your prescription filled  early. 4. Prescription Accuracy: You are responsible for carefully inspecting your prescriptions before leaving our office. Have the discharge nurse carefully go over each prescription with you, before taking them home. Make sure that your name is accurately spelled, that your address is correct. Check the name and dose of your medication to make sure it is accurate. Check the number of pills, and the written instructions to make sure they are clear and accurate. Make sure that you are given enough medication to last until your next medication refill appointment. 5. Taking Medication: Take medication as prescribed. When it comes to controlled substances, taking less pills or less frequently than prescribed is permitted and encouraged. Never take more pills than instructed. Never take medication more frequently than prescribed.  6. Inform other Doctors: Always inform, all of your healthcare providers, of all the medications you take. 7. Pain Medication from other Providers: You are not allowed to accept any additional pain medication from any other Doctor or Healthcare provider. There are two exceptions to this rule. (see below) In the event that you require additional pain medication, you are responsible for notifying us, as stated below. 8. Medication Agreement: You are responsible for carefully reading and following our Medication Agreement. This must be signed before receiving any prescriptions from our practice. Safely store a copy of your signed Agreement. Violations to the Agreement will result in no further prescriptions. (Additional copies of our Medication Agreement are available upon request.) 9. Laws, Rules, & Regulations: All patients are expected to follow all Federal and State Laws, Statutes, Rules, & Regulations. Ignorance of the Laws does not constitute a valid excuse. The use of any illegal substances is prohibited. 10. Adopted CDC guidelines & recommendations: Target dosing levels will be  at or below 60 MME/day. Use of benzodiazepines** is not recommended.  Exceptions: There are only two exceptions to the rule of not   receiving pain medications from other Healthcare Providers. 1. Exception #1 (Emergencies): In the event of an emergency (i.e.: accident requiring emergency care), you are allowed to receive additional pain medication. However, you are responsible for: As soon as you are able, call our office [phone], at any time of the day or night, and leave a message stating your name, the date and nature of the emergency, and the name and dose of the medication prescribed. In the event that your call is answered by a member of our staff, make sure to document and save the date, time, and the name of the person that took your information.  2. Exception #2 (Planned Surgery): In the event that you are scheduled by another doctor or dentist to have any type of surgery or procedure, you are allowed (for a period no longer than 30 days), to receive additional pain medication, for the acute post-op pain. However, in this case, you are responsible for picking up a copy of our "Post-op Pain Management for Surgeons" handout, and giving it to your surgeon or dentist. This document is available at our office, and does not require an appointment to obtain it. Simply go to our office during business hours (Monday-Thursday from 8:00 AM to 4:00 PM) (Friday 8:00 AM to 12:00 Noon) or if you have a scheduled appointment with us, prior to your surgery, and ask for it by name. In addition, you will need to provide us with your name, name of your surgeon, type of surgery, and date of procedure or surgery.  *Opioid medications include: morphine, codeine, oxycodone, oxymorphone, hydrocodone, hydromorphone, meperidine, tramadol, tapentadol, buprenorphine, fentanyl, methadone. **Benzodiazepine medications include: diazepam (Valium), alprazolam (Xanax), clonazepam (Klonopine), lorazepam (Ativan), clorazepate  (Tranxene), chlordiazepoxide (Librium), estazolam (Prosom), oxazepam (Serax), temazepam (Restoril), triazolam (Halcion) (Last updated: 08/13/2017) ____________________________________________________________________________________________   ____________________________________________________________________________________________  Medication Recommendations and Reminders  Applies to: All patients receiving prescriptions (written and/or electronic).  Medication Rules & Regulations: These rules and regulations exist for your safety and that of others. They are not flexible and neither are we. Dismissing or ignoring them will be considered "non-compliance" with medication therapy, resulting in complete and irreversible termination of such therapy. (See document titled "Medication Rules" for more details.) In all conscience, because of safety reasons, we cannot continue providing a therapy where the patient does not follow instructions.  Pharmacy of record:   Definition: This is the pharmacy where your electronic prescriptions will be sent.   We do not endorse any particular pharmacy.  You are not restricted in your choice of pharmacy.  The pharmacy listed in the electronic medical record should be the one where you want electronic prescriptions to be sent.  If you choose to change pharmacy, simply notify our nursing staff of your choice of new pharmacy.  Recommendations:  Keep all of your pain medications in a safe place, under lock and key, even if you live alone.   After you fill your prescription, take 1 week's worth of pills and put them away in a safe place. You should keep a separate, properly labeled bottle for this purpose. The remainder should be kept in the original bottle. Use this as your primary supply, until it runs out. Once it's gone, then you know that you have 1 week's worth of medicine, and it is time to come in for a prescription refill. If you do this correctly, it  is unlikely that you will ever run out of medicine.  To make sure that the above recommendation works,   it is very important that you make sure your medication refill appointments are scheduled at least 1 week before you run out of medicine. To do this in an effective manner, make sure that you do not leave the office without scheduling your next medication management appointment. Always ask the nursing staff to show you in your prescription , when your medication will be running out. Then arrange for the receptionist to get you a return appointment, at least 7 days before you run out of medicine. Do not wait until you have 1 or 2 pills left, to come in. This is very poor planning and does not take into consideration that we may need to cancel appointments due to bad weather, sickness, or emergencies affecting our staff.  "Partial Fill": If for any reason your pharmacy does not have enough pills/tablets to completely fill or refill your prescription, do not allow for a "partial fill". You will need a separate prescription to fill the remaining amount, which we will not provide. If the reason for the partial fill is your insurance, you will need to talk to the pharmacist about payment alternatives for the remaining tablets, but again, do not accept a partial fill.  Prescription refills and/or changes in medication(s):   Prescription refills, and/or changes in dose or medication, will be conducted only during scheduled medication management appointments. (Applies to both, written and electronic prescriptions.)  No refills on procedure days. No medication will be changed or started on procedure days. No changes, adjustments, and/or refills will be conducted on a procedure day. Doing so will interfere with the diagnostic portion of the procedure.  No phone refills. No medications will be "called into the pharmacy".  No Fax refills.  No weekend refills.  No Holliday refills.  No after hours  refills.  Remember:  Business hours are:  Monday to Thursday 8:00 AM to 4:00 PM Provider's Schedule: Crystal King, NP - Appointments are:  Medication management: Monday to Thursday 8:00 AM to 4:00 PM Kyleigha Markert, MD - Appointments are:  Medication management: Monday and Wednesday 8:00 AM to 4:00 PM Procedure day: Tuesday and Thursday 7:30 AM to 4:00 PM Bilal Lateef, MD - Appointments are:  Medication management: Tuesday and Thursday 8:00 AM to 4:00 PM Procedure day: Monday and Wednesday 7:30 AM to 4:00 PM (Last update: 08/13/2017) ____________________________________________________________________________________________   ____________________________________________________________________________________________  CANNABIDIOL (AKA: CBD Oil or Pills)  Applies to: All patients receiving prescriptions of controlled substances (written and/or electronic).  General Information: Cannabidiol (CBD) was discovered in 1940. It is one of some 113 identified cannabinoids in cannabis (Marijuana) plants, accounting for up to 40% of the plant's extract. As of 2018, preliminary clinical research on cannabidiol included studies of anxiety, cognition, movement disorders, and pain.  Cannabidiol is consummed in multiple ways, including inhalation of cannabis smoke or vapor, as an aerosol spray into the cheek, and by mouth. It may be supplied as CBD oil containing CBD as the active ingredient (no added tetrahydrocannabinol (THC) or terpenes), a full-plant CBD-dominant hemp extract oil, capsules, dried cannabis, or as a liquid solution. CBD is thought not have the same psychoactivity as THC, and may affect the actions of THC. Studies suggest that CBD may interact with different biological targets, including cannabinoid receptors and other neurotransmitter receptors. As of 2018 the mechanism of action for its biological effects has not been determined.  In the [place], cannabidiol has a limited  approval by the Food and Drug Administration (FDA) for treatment of only two types   of epilepsy disorders. The side effects of long-term use of the drug include somnolence, decreased appetite, diarrhea, fatigue, malaise, weakness, sleeping problems, and others.  CBD remains a Schedule I drug prohibited for any use.  Legality: Some manufacturers ship CBD products nationally, an illegal action which the FDA has not enforced in 2018, with CBD remaining the subject of an FDA investigational new drug evaluation, and is not considered legal as a dietary supplement or food ingredient as of December 2018. Federal illegality has made it difficult historically to conduct research on CBD. CBD is openly sold in head shops and health food stores in some states where such sales have not been explicitly legalized.  Warning: Because it is not FDA approved for general use or treatment of pain, it is not required to undergo the same manufacturing controls as prescription drugs.  This means that the available cannabidiol (CBD) may be contaminated with THC.  If this is the case, it will trigger a positive urine drug screen (UDS) test for cannabinoids (Marijuana).  Because a positive UDS for illicit substances is a violation of our medication agreement, your opioid analgesics (pain medicine) may be permanently discontinued. (Last update: 09/03/2017) ____________________________________________________________________________________________    ______________________________________________________________________________________________  Specialty Pain Scale  Introduction:  There are significant differences in how pain is reported. The word pain usually refers to physical pain, but it is also a common synonym of suffering. The medical community uses a scale from 0 (zero) to 10 (ten) to report pain level. Zero (0) is described as "no pain", while ten (10) is described as "the worse pain you can imagine". The problem with  this scale is that physical pain is reported along with suffering. Suffering refers to mental pain, or more often yet it refers to any unpleasant feeling, emotion or aversion associated with the perception of harm or threat of harm. It is the psychological component of pain.  Pain Specialists prefer to separate the two components. The pain scale used by this practice is the Verbal Numerical Rating Scale (VNRS-11). This scale is for the physical pain only. DO NOT INCLUDE how your pain psychologically affects you. This scale is for adults 66 years of age and older. It has 11 (eleven) levels. The 1st level is 0/10. This means: "right now, I have no pain". In the context of pain management, it also means: "right now, my physical pain is under control with the current therapy".  General Information:  The scale should reflect your current level of pain. Unless you are specifically asked for the level of your worst pain, or your average pain. If you are asked for one of these two, then it should be understood that it is over the past 24 hours.  Levels 1 (one) through 5 (five) are described below, and can be treated as an outpatient. Ambulatory pain management facilities such as ours are more than adequate to treat these levels. Levels 6 (six) through 10 (ten) are also described below, however, these must be treated as a hospitalized patient. While levels 6 (six) and 7 (seven) may be evaluated at an urgent care facility, levels 8 (eight) through 10 (ten) constitute medical emergencies and as such, they belong in a hospital's emergency department. When having these levels (as described below), do not come to our office. Our facility is not equipped to manage these levels. Go directly to an urgent care facility or an emergency department to be evaluated.  Definitions:  Activities of Daily Living (ADL): Activities of daily living (  ADL or ADLs) is a term used in healthcare to refer to people's daily self-care  activities. Health professionals often use a person's ability or inability to perform ADLs as a measurement of their functional status, particularly in regard to people post injury, with disabilities and the elderly. There are two ADL levels: Basic and Instrumental. Basic Activities of Daily Living (BADL  or BADLs) consist of self-care tasks that include: Bathing and showering; personal hygiene and grooming (including brushing/combing/styling hair); dressing; Toilet hygiene (getting to the toilet, cleaning oneself, and getting back up); eating and self-feeding (not including cooking or chewing and swallowing); functional mobility, often referred to as "transferring", as measured by the ability to walk, get in and out of bed, and get into and out of a chair; the broader definition (moving from one place to another while performing activities) is useful for people with different physical abilities who are still able to get around independently. Basic ADLs include the things many people do when they get up in the morning and get ready to go out of the house: get out of bed, go to the toilet, bathe, dress, groom, and eat. On the average, loss of function typically follows a particular order. Hygiene is the first to go, followed by loss of toilet use and locomotion. The last to go is the ability to eat. When there is only one remaining area in which the person is independent, there is a 62.9% chance that it is eating and only a 3.5% chance that it is hygiene. Instrumental Activities of Daily Living (IADL or IADLs) are not necessary for fundamental functioning, but they let an individual live independently in a community. IADL consist of tasks that include: cleaning and maintaining the house; home establishment and maintenance; care of others (including selecting and supervising caregivers); care of pets; child rearing; managing money; managing financials (investments, etc.); meal preparation and cleanup; shopping for  groceries and necessities; moving within the community; safety procedures and emergency responses; health management and maintenance (taking prescribed medications); and using the telephone or other form of communication.  Instructions:  Most patients tend to report their pain as a combination of two factors, their physical pain and their psychosocial pain. This last one is also known as "suffering" and it is reflection of how physical pain affects you socially and psychologically. From now on, report them separately.  From this point on, when asked to report your pain level, report only your physical pain. Use the following table for reference.  Pain Clinic Pain Levels (0-5/10)  Pain Level Score  Description  No Pain 0   Mild pain 1 Nagging, annoying, but does not interfere with basic activities of daily living (ADL). Patients are able to eat, bathe, get dressed, toileting (being able to get on and off the toilet and perform personal hygiene functions), transfer (move in and out of bed or a chair without assistance), and maintain continence (able to control bladder and bowel functions). Blood pressure and heart rate are unaffected. A normal heart rate for a healthy adult ranges from 60 to 100 bpm (beats per minute).   Mild to moderate pain 2 Noticeable and distracting. Impossible to hide from other people. More frequent flare-ups. Still possible to adapt and function close to normal. It can be very annoying and may have occasional stronger flare-ups. With discipline, patients may get used to it and adapt.   Moderate pain 3 Interferes significantly with activities of daily living (ADL). It becomes difficult to feed, bathe, get  dressed, get on and off the toilet or to perform personal hygiene functions. Difficult to get in and out of bed or a chair without assistance. Very distracting. With effort, it can be ignored when deeply involved in activities.   Moderately severe pain 4 Impossible to ignore for  more than a few minutes. With effort, patients may still be able to manage work or participate in some social activities. Very difficult to concentrate. Signs of autonomic nervous system discharge are evident: dilated pupils (mydriasis); mild sweating (diaphoresis); sleep interference. Heart rate becomes elevated (>115 bpm). Diastolic blood pressure (lower number) rises above 100 mmHg. Patients find relief in laying down and not moving.   Severe pain 5 Intense and extremely unpleasant. Associated with frowning face and frequent crying. Pain overwhelms the senses.  Ability to do any activity or maintain social relationships becomes significantly limited. Conversation becomes difficult. Pacing back and forth is common, as getting into a comfortable position is nearly impossible. Pain wakes you up from deep sleep. Physical signs will be obvious: pupillary dilation; increased sweating; goosebumps; brisk reflexes; cold, clammy hands and feet; nausea, vomiting or dry heaves; loss of appetite; significant sleep disturbance with inability to fall asleep or to remain asleep. When persistent, significant weight loss is observed due to the complete loss of appetite and sleep deprivation.  Blood pressure and heart rate becomes significantly elevated. Caution: If elevated blood pressure triggers a pounding headache associated with blurred vision, then the patient should immediately seek attention at an urgent or emergency care unit, as these may be signs of an impending stroke.    Emergency Department Pain Levels (6-10/10)  Emergency Room Pain 6 Severely limiting. Requires emergency care and should not be seen or managed at an outpatient pain management facility. Communication becomes difficult and requires great effort. Assistance to reach the emergency department may be required. Facial flushing and profuse sweating along with potentially dangerous increases in heart rate and blood pressure will be evident.    Distressing pain 7 Self-care is very difficult. Assistance is required to transport, or use restroom. Assistance to reach the emergency department will be required. Tasks requiring coordination, such as bathing and getting dressed become very difficult.   Disabling pain 8 Self-care is no longer possible. At this level, pain is disabling. The individual is unable to do even the most "basic" activities such as walking, eating, bathing, dressing, transferring to a bed, or toileting. Fine motor skills are lost. It is difficult to think clearly.   Incapacitating pain 9 Pain becomes incapacitating. Thought processing is no longer possible. Difficult to remember your own name. Control of movement and coordination are lost.   The worst pain imaginable 10 At this level, most patients pass out from pain. When this level is reached, collapse of the autonomic nervous system occurs, leading to a sudden drop in blood pressure and heart rate. This in turn results in a temporary and dramatic drop in blood flow to the brain, leading to a loss of consciousness. Fainting is one of the body's self defense mechanisms. Passing out puts the brain in a calmed state and causes it to shut down for a while, in order to begin the healing process.    Summary: 1.   Refer to this scale when providing [place] with your pain level. 2.   Be accurate and careful when reporting your pain level. This will help with your care. 3.   Over-reporting your pain level will lead to loss of credibility. 4.  Even a level of 1/10 means that there is pain and will be treated at our facility. 5.   High, inaccurate reporting will be documented as "Symptom Exaggeration", leading to loss of credibility and suspicions of possible secondary gains such as obtaining more narcotics, or wanting to appear disabled, for fraudulent reasons. 6.   Only pain levels of 5 or below will be seen at our facility. 7.   Pain levels of 6 and above will be sent to the  Emergency Department and the appointment cancelled.  ______________________________________________________________________________________________
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
## 2018-11-08 ENCOUNTER — Ambulatory Visit
Admission: RE | Admit: 2018-11-08 | Discharge: 2018-11-08 | Disposition: A | Discharge status: Home / Self Care | Payer: 59 | Attending: Pain Medicine | Admitting: Pain Medicine

## 2018-11-08 ENCOUNTER — Other Ambulatory Visit
Admission: RE | Admit: 2018-11-08 | Discharge: 2018-11-08 | Disposition: A | Discharge status: Home / Self Care | Payer: 59 | Source: Home / Self Care | Attending: Pain Medicine | Admitting: Pain Medicine

## 2018-11-08 ENCOUNTER — Ambulatory Visit
Admission: RE | Admit: 2018-11-08 | Discharge: 2018-11-08 | Disposition: A | Discharge status: Home / Self Care | Payer: 59 | Source: Ambulatory Visit | Attending: Pain Medicine | Admitting: Pain Medicine

## 2018-11-08 DIAGNOSIS — R0781 Pleurodynia: Secondary | ICD-10-CM | POA: Insufficient documentation

## 2018-11-08 DIAGNOSIS — M25551 Pain in right hip: Secondary | ICD-10-CM | POA: Insufficient documentation

## 2018-11-08 DIAGNOSIS — M549 Dorsalgia, unspecified: Secondary | ICD-10-CM

## 2018-11-08 DIAGNOSIS — R51 Headache: Secondary | ICD-10-CM | POA: Insufficient documentation

## 2018-11-08 DIAGNOSIS — M5136 Other intervertebral disc degeneration, lumbar region: Secondary | ICD-10-CM | POA: Diagnosis present

## 2018-11-08 DIAGNOSIS — G8929 Other chronic pain: Secondary | ICD-10-CM | POA: Diagnosis present

## 2018-11-08 DIAGNOSIS — M542 Cervicalgia: Secondary | ICD-10-CM | POA: Insufficient documentation

## 2018-11-08 DIAGNOSIS — M79671 Pain in right foot: Secondary | ICD-10-CM | POA: Insufficient documentation

## 2018-11-08 DIAGNOSIS — M79641 Pain in right hand: Secondary | ICD-10-CM | POA: Insufficient documentation

## 2018-11-08 DIAGNOSIS — M4310 Spondylolisthesis, site unspecified: Secondary | ICD-10-CM

## 2018-11-08 DIAGNOSIS — M545 Low back pain: Secondary | ICD-10-CM | POA: Insufficient documentation

## 2018-11-08 DIAGNOSIS — M5030 Other cervical disc degeneration, unspecified cervical region: Secondary | ICD-10-CM | POA: Insufficient documentation

## 2018-11-08 DIAGNOSIS — M79642 Pain in left hand: Secondary | ICD-10-CM | POA: Insufficient documentation

## 2018-11-08 DIAGNOSIS — M25512 Pain in left shoulder: Secondary | ICD-10-CM | POA: Diagnosis present

## 2018-11-08 DIAGNOSIS — M79672 Pain in left foot: Secondary | ICD-10-CM | POA: Insufficient documentation

## 2018-11-08 DIAGNOSIS — M47816 Spondylosis without myelopathy or radiculopathy, lumbar region: Secondary | ICD-10-CM | POA: Insufficient documentation

## 2018-11-08 DIAGNOSIS — M25552 Pain in left hip: Secondary | ICD-10-CM | POA: Diagnosis present

## 2018-11-08 DIAGNOSIS — G4486 Cervicogenic headache: Secondary | ICD-10-CM

## 2018-11-08 LAB — COMPREHENSIVE METABOLIC PANEL
ALT: 15 U/L (ref 0–44)
AST: 15 U/L (ref 15–41)
Albumin: 3.6 g/dL (ref 3.5–5.0)
Alkaline Phosphatase: 57 U/L (ref 38–126)
Anion gap: 8 (ref 5–15)
BUN: 12 mg/dL (ref 6–20)
CO2: 22 mmol/L (ref 22–32)
Calcium: 8.9 mg/dL (ref 8.9–10.3)
Chloride: 109 mmol/L (ref 98–111)
Creatinine, Ser: 0.58 mg/dL (ref 0.44–1.00)
GFR calc Af Amer: >60 mL/min (ref >60)
GFR calc non Af Amer: >60 mL/min (ref >60)
Glucose, Bld: 134 mg/dL — ABNORMAL HIGH (ref 70–99)
Potassium: 3.8 mmol/L (ref 3.5–5.1)
Sodium: 139 mmol/L (ref 135–145)
Total Bilirubin: 0.6 mg/dL (ref 0.3–1.2)
Total Protein: 7.5 g/dL (ref 6.5–8.1)

## 2018-11-08 LAB — MAGNESIUM: Magnesium: 2.2 mg/dL (ref 1.7–2.4)

## 2018-11-08 LAB — C-REACTIVE PROTEIN: CRP: <0.8 mg/dL (ref <1.0)

## 2018-11-08 LAB — SEDIMENTATION RATE: Sed Rate: 27 mm/hr (ref 0–30)

## 2018-11-08 LAB — VITAMIN B12: Vitamin B-12: 214 pg/mL (ref 180–914)

## 2018-11-10 LAB — VITAMIN D 25 HYDROXY (VIT D DEFICIENCY, FRACTURES): Vit D, 25-Hydroxy: 18.3 ng/mL — ABNORMAL LOW (ref 30.0–100.0)

## 2018-11-12 ENCOUNTER — Other Ambulatory Visit: Payer: Self-pay | Admitting: Obstetrics and Gynecology

## 2018-11-12 ENCOUNTER — Encounter: Payer: Self-pay | Admitting: Internal Medicine

## 2018-11-12 DIAGNOSIS — Z1231 Encounter for screening mammogram for malignant neoplasm of breast: Secondary | ICD-10-CM

## 2018-11-13 MED ORDER — TRAMADOL HCL 50 MG PO TABS: 50.0000 mg | ORAL_TABLET | Freq: Every day | ORAL | 0 refills | Status: DC | PRN

## 2018-11-15 ENCOUNTER — Other Ambulatory Visit: Payer: Self-pay

## 2018-11-15 ENCOUNTER — Encounter: Payer: Self-pay | Admitting: Internal Medicine

## 2018-11-15 ENCOUNTER — Ambulatory Visit (INDEPENDENT_AMBULATORY_CARE_PROVIDER_SITE_OTHER): Payer: 59 | Admitting: Internal Medicine

## 2018-11-15 VITALS — BP 126/84 | HR 87 | Temp 98.1°F | Ht 65.5 in | Wt 277.0 lb

## 2018-11-15 DIAGNOSIS — Z0001 Encounter for general adult medical examination with abnormal findings: Secondary | ICD-10-CM | POA: Diagnosis not present

## 2018-11-15 DIAGNOSIS — K219 Gastro-esophageal reflux disease without esophagitis: Secondary | ICD-10-CM | POA: Diagnosis not present

## 2018-11-15 DIAGNOSIS — E559 Vitamin D deficiency, unspecified: Secondary | ICD-10-CM | POA: Diagnosis not present

## 2018-11-15 DIAGNOSIS — F32A Depression, unspecified: Secondary | ICD-10-CM

## 2018-11-15 DIAGNOSIS — R609 Edema, unspecified: Secondary | ICD-10-CM | POA: Diagnosis not present

## 2018-11-15 DIAGNOSIS — F419 Anxiety disorder, unspecified: Secondary | ICD-10-CM

## 2018-11-15 DIAGNOSIS — F329 Major depressive disorder, single episode, unspecified: Secondary | ICD-10-CM

## 2018-11-15 DIAGNOSIS — G894 Chronic pain syndrome: Secondary | ICD-10-CM

## 2018-11-15 DIAGNOSIS — Z6841 Body Mass Index (BMI) 40.0 and over, adult: Secondary | ICD-10-CM | POA: Insufficient documentation

## 2018-11-15 LAB — LIPID PANEL
Cholesterol: 223 mg/dL — ABNORMAL HIGH (ref 0–200)
HDL: 61.9 mg/dL (ref >39.00)
LDL Cholesterol: 140 mg/dL — ABNORMAL HIGH (ref 0–99)
NonHDL: 160.87
Total CHOL/HDL Ratio: 4
Triglycerides: 106 mg/dL (ref 0.0–149.0)
VLDL: 21.2 mg/dL (ref 0.0–40.0)

## 2018-11-15 LAB — CBC
HCT: 38.9 % (ref 36.0–46.0)
Hemoglobin: 13.3 g/dL (ref 12.0–15.0)
MCHC: 34.1 g/dL (ref 30.0–36.0)
MCV: 95.7 fl (ref 78.0–100.0)
Platelets: 220 10*3/uL (ref 150.0–400.0)
RBC: 4.07 Mil/uL (ref 3.87–5.11)
RDW: 12.9 % (ref 11.5–15.5)
WBC: 6 10*3/uL (ref 4.0–10.5)

## 2018-11-15 LAB — HEMOGLOBIN A1C: Hgb A1c MFr Bld: 6 % (ref 4.6–6.5)

## 2018-11-15 MED ORDER — VITAMIN D (ERGOCALCIFEROL) 1.25 MG (50000 UNIT) PO CAPS: 50000.0000 [IU] | ORAL_CAPSULE | ORAL | 0 refills | Status: DC

## 2018-11-15 MED ORDER — HYDROCHLOROTHIAZIDE 25 MG PO TABS: 25.0000 mg | ORAL_TABLET | Freq: Every day | ORAL | 2 refills | Status: DC

## 2018-11-15 NOTE — Progress Notes (Signed)
Subjective:    Patient ID: [name], female    DOB: 05-01-1966, 53 y.o.   MRN: [id number]  HPI  Pt due for her annual exam. She is also due for follow up of chronic conditions.  Chronic Joint Pain: She is following with Dr. Dossie Arbour. Multiple xrays from 10/2018 reviewed. She is taking Duexis or Meloxicam, Diclofenac Gel, Methocarbamol and Tramadol as prescribed.   Anxiety and Depression: Chronic dysthymia. Controlled off meds. She is not seeing a therapist. She denies SI/HI.  Flu: 03/2018 Tetanus: 09/2016 Pap Smear: 05/2013, hysterectomy Mammogram: 12/2015, rescheduled for July 2020 Colon Screening: > 10 years ago Vision Screening: annually Dentist: annually  Diet: She does eat meat. She consumes fruits and veggies daily. She does eat fried foods. She drinks mostly water, some soda. Exercise: None  Review of Systems  Past Medical History:  Diagnosis Date  . Allergy   . DDD (degenerative disc disease), lumbar   . Depression     Current Outpatient Medications  Medication Sig Dispense Refill  . albuterol (PROVENTIL HFA;VENTOLIN HFA) 108 (90 Base) MCG/ACT inhaler Inhale 2 puffs into the lungs every 6 (six) hours as needed for wheezing or shortness of breath. 1 Inhaler 0  . diclofenac sodium (VOLTAREN) 1 % GEL Apply 2 g topically 4 (four) times daily. 100 g 0  . fexofenadine-pseudoephedrine (ALLEGRA-D 24) 180-240 MG 24 hr tablet TAKE 1 TABLET BY MOUTH EVERY DAY AS NEEDED 30 tablet 1  . fluticasone (FLONASE) 50 MCG/ACT nasal spray Place 2 sprays into both nostrils daily. 16 g 6  . hydrOXYzine (ATARAX/VISTARIL) 10 MG tablet TAKE 1 TABLET BY MOUTH EVERY DAY AS NEEDED 90 tablet 0  . Ibuprofen-Famotidine (DUEXIS) 800-26.6 MG TABS Take 1 tablet by mouth 3 (three) times daily as needed. 90 tablet 1  . meloxicam (MOBIC) 15 MG tablet Take 1 tablet (15 mg total) by mouth daily. MUST SCHEDULE ANNUAL PHYSICAL 30 tablet 0  . methocarbamol (ROBAXIN) 500 MG tablet Take 1 tablet (500 mg  total) by mouth at bedtime as needed for muscle spasms. 20 tablet 0  . traMADol (ULTRAM) 50 MG tablet Take 1 tablet (50 mg total) by mouth daily as needed. 30 tablet 0   No current facility-administered medications for this visit.     Allergies  Allergen Reactions  . Orange Concentrate [Flavoring Agent] Rash    Rash on right side of body  . Gabapentin Other (See Comments)    Hair loss  . Penicillins Other (See Comments)    Childhood allergy Has patient had a PCN reaction causing immediate rash, facial/tongue/throat swelling, SOB or lightheadedness with hypotension: Unknown Has patient had a PCN reaction causing severe rash involving mucus membranes or skin necrosis: Unknown Has patient had a PCN reaction that required hospitalization: No  Has patient had a PCN reaction occurring within the last 10 years: No  If all of the above answers are "NO", then may proceed with Cephalosporin use.     Family History  Problem Relation Age of Onset  . Hyperlipidemia Mother   . Heart disease Maternal Aunt   . Stroke Maternal Aunt   . Diabetes Maternal Aunt   . Heart disease Maternal Uncle   . Stroke Maternal Uncle   . Cancer Neg Hx     Social History   Socioeconomic History  . Marital status: Married    Spouse name: Not on file  . Number of children: Not on file  . Years of education: Not on file  .  Highest education level: Not on file  Occupational History  . Not on file  Social Needs  . Financial resource strain: Not on file  . Food insecurity:    Worry: Not on file    Inability: Not on file  . Transportation needs:    Medical: Not on file    Non-medical: Not on file  Tobacco Use  . Smoking status: Never Smoker  . Smokeless tobacco: Never Used  Substance and Sexual Activity  . Alcohol use: Yes    Comment: social  . Drug use: No  . Sexual activity: Yes    Partners: Male    Birth control/protection: Post-menopausal  Lifestyle  . Physical activity:    Days per week: Not  on file    Minutes per session: Not on file  . Stress: Not on file  Relationships  . Social connections:    Talks on phone: Not on file    Gets together: Not on file    Attends religious service: Not on file    Active member of club or organization: Not on file    Attends meetings of clubs or organizations: Not on file    Relationship status: Not on file  . Intimate partner violence:    Fear of current or ex partner: Not on file    Emotionally abused: Not on file    Physically abused: Not on file    Forced sexual activity: Not on file  Other Topics Concern  . Not on file  Social History Narrative  . Not on file     Constitutional: Denies fever, malaise, fatigue, headache or abrupt weight changes.  HEENT: Denies eye pain, eye redness, ear pain, ringing in the ears, wax buildup, runny nose, nasal congestion, bloody nose, or sore throat. Respiratory: Denies difficulty breathing, shortness of breath, cough or sputum production.   Cardiovascular: Denies chest pain, chest tightness, palpitations or swelling in the hands or feet.  Gastrointestinal: Pt reports intermittent reflux. Denies abdominal pain, bloating, constipation, diarrhea or blood in the stool.  GU: Denies urgency, frequency, pain with urination, burning sensation, blood in urine, odor or discharge. Musculoskeletal: Pt reports multiple joint pain. Denies decrease in range of motion, difficulty with gait, muscle pain or joint swelling.  Skin: Denies redness, rashes, lesions or ulcercations.  Neurological: Denies dizziness, difficulty with memory, difficulty with speech or problems with balance and coordination.  Psych: Pt has a history of anxiety and depression. Denies SI/HI.  No other specific complaints in a complete review of systems (except as listed in HPI above).     Objective:   Physical Exam   BP 126/84   Pulse 87   Temp 98.1 F (36.7 C) (Oral)   Ht 5' 5.5" (1.664 m)   Wt 277 lb (125.6 kg)   SpO2 97%   BMI  45.39 kg/m  Wt Readings from Last 3 Encounters:  11/15/18 277 lb (125.6 kg)  10/06/18 263 lb (119.3 kg)  08/02/18 263 lb (119.3 kg)    General: Appears her stated age, obese, in NAD. Skin: Warm, dry and intact. Multiple skin tags noted of neck and upper chest. HEENT: Head: normal shape and size; Eyes: sclera white, no icterus, conjunctiva pink, PERRLA and EOMs intact; Ears: Tm's gray and intact, normal light reflex;  Neck:  Neck supple, trachea midline. No masses, lumps or thyromegaly present.  Cardiovascular: Normal rate and rhythm. S1,S2 noted.  No murmur, rubs or gallops noted. Trace nonpitting BLE edema. No carotid bruits noted. Pulmonary/Chest: Normal  effort and positive vesicular breath sounds. No respiratory distress. No wheezes, rales or ronchi noted.  Abdomen: Soft and nontender. Normal bowel sounds. No distention or masses noted. Liver, spleen and kidneys non palpable. Musculoskeletal: Strength 5/5 BUE/BLE. No difficulty with gait.  Neurological: Alert and oriented. Cranial nerves II-XII grossly intact. Coordination normal.  Psychiatric: Mood and affect normal. Behavior is normal. Judgment and thought content normal.     BMET    Component Value Date/Time   NA 139 11/08/2018 0940   NA 142 09/18/2016 0929   K 3.8 11/08/2018 0940   CL 109 11/08/2018 0940   CO2 22 11/08/2018 0940   GLUCOSE 134 (H) 11/08/2018 0940   BUN 12 11/08/2018 0940   BUN 9 09/18/2016 0929   CREATININE 0.58 11/08/2018 0940   CALCIUM 8.9 11/08/2018 0940   GFRNONAA >60 11/08/2018 0940   GFRAA >60 11/08/2018 0940    Lipid Panel     Component Value Date/Time   CHOL 191 07/30/2017 0902   CHOL 200 (H) 03/23/2017 0844   TRIG 124.0 07/30/2017 0902   HDL 53.90 07/30/2017 0902   HDL 57 03/23/2017 0844   CHOLHDL 4 07/30/2017 0902   VLDL 24.8 07/30/2017 0902   LDLCALC 112 (H) 07/30/2017 0902   LDLCALC 124 (H) 03/23/2017 0844    CBC    Component Value Date/Time   WBC 5.0 07/30/2017 0902   RBC  3.96 07/30/2017 0902   HGB 12.8 07/30/2017 0902   HGB 13.2 09/18/2016 0929   HCT 37.8 07/30/2017 0902   HCT 38.1 09/18/2016 0929   PLT 190.0 07/30/2017 0902   PLT 268 09/18/2016 0929   MCV 95.4 07/30/2017 0902   MCV 94 09/18/2016 0929   MCH 32.7 09/18/2016 0929   MCH 31.5 03/11/2016 1309   MCHC 33.8 07/30/2017 0902   RDW 13.1 07/30/2017 0902   RDW 12.8 09/18/2016 0929   LYMPHSABS 2.8 08/03/2014 1609   EOSABS 0.1 08/03/2014 1609   BASOSABS 0.0 08/03/2014 1609    Hgb A1C Lab Results  Component Value Date   HGBA1C 5.8 07/30/2017           Assessment & Plan:   Preventative Health Maintenace:  Flu shot UTD Tetanus UTD She no longer needs pap smears Mammogram scheduled 12/2018 She declines colonoscopy but is agreeable to Cologuard- ordered Encouraged her to consume a balanced diet and exercise regimen  Advised her to see an eye doctor and dentist annually CMET, Vit D from 09/2018 reviewed Will check CBC, Lipid and A1C today  Peripheral Edema:  CMET reviewed Encouraged elevation Discuss low salt diet RX for HCTZ 25 mg PO daily  RTC in 3 months for lab only Vit D check Webb Silversmith, NP

## 2018-11-15 NOTE — Assessment & Plan Note (Signed)
Stable off meds Support offered today Will monitor 

## 2018-11-15 NOTE — Assessment & Plan Note (Signed)
Discussed how avoiding foods, decreasing stress and weight loss can help reduce reflux symptoms Controlled with prn Tums Advised her if happening more than 3 days per week, we should start Omeprazole- she will keep me updated on this CMET reviewed, CBC ordered today

## 2018-11-15 NOTE — Patient Instructions (Signed)
Health Maintenance for Postmenopausal Women Menopause is a normal process in which your reproductive ability comes to an end. This process happens gradually over a span of months to years, usually between the ages of 62 and 89. Menopause is complete when you have missed 12 consecutive menstrual periods. It is important to talk with your health care provider about some of the most common conditions that affect postmenopausal women, such as heart disease, cancer, and bone loss (osteoporosis). Adopting a healthy lifestyle and getting preventive care can help to promote your health and wellness. Those actions can also lower your chances of developing some of these common conditions. What should I know about menopause? During menopause, you may experience a number of symptoms, such as:  Moderate-to-severe hot flashes.  Night sweats.  Decrease in sex drive.  Mood swings.  Headaches.  Tiredness.  Irritability.  Memory problems.  Insomnia. Choosing to treat or not to treat menopausal changes is an individual decision that you make with your health care provider. What should I know about hormone replacement therapy and supplements? Hormone therapy products are effective for treating symptoms that are associated with menopause, such as hot flashes and night sweats. Hormone replacement carries certain risks, especially as you become older. If you are thinking about using estrogen or estrogen with progestin treatments, discuss the benefits and risks with your health care provider. What should I know about heart disease and stroke? Heart disease, heart attack, and stroke become more likely as you age. This may be due, in part, to the hormonal changes that your body experiences during menopause. These can affect how your body processes dietary fats, triglycerides, and cholesterol. Heart attack and stroke are both medical emergencies. There are many things that you can do to help prevent heart disease  and stroke:  Have your blood pressure checked at least every 1-2 years. High blood pressure causes heart disease and increases the risk of stroke.  If you are 79-72 years old, ask your health care provider if you should take aspirin to prevent a heart attack or a stroke.  Do not use any tobacco products, including cigarettes, chewing tobacco, or electronic cigarettes. If you need help quitting, ask your health care provider.  It is important to eat a healthy diet and maintain a healthy weight. ? Be sure to include plenty of vegetables, fruits, low-fat dairy products, and lean protein. ? Avoid eating foods that are high in solid fats, added sugars, or salt (sodium).  Get regular exercise. This is one of the most important things that you can do for your health. ? Try to exercise for at least 150 minutes each week. The type of exercise that you do should increase your heart rate and make you sweat. This is known as moderate-intensity exercise. ? Try to do strengthening exercises at least twice each week. Do these in addition to the moderate-intensity exercise.  Know your numbers.Ask your health care provider to check your cholesterol and your blood glucose. Continue to have your blood tested as directed by your health care provider.  What should I know about cancer screening? There are several types of cancer. Take the following steps to reduce your risk and to catch any cancer development as early as possible. Breast Cancer  Practice breast self-awareness. ? This means understanding how your breasts normally appear and feel. ? It also means doing regular breast self-exams. Let your health care provider know about any changes, no matter how small.  If you are 40 or  older, have a clinician do a breast exam (clinical breast exam or CBE) every year. Depending on your age, family history, and medical history, it may be recommended that you also have a yearly breast X-ray (mammogram).  If you  have a family history of breast cancer, talk with your health care provider about genetic screening.  If you are at high risk for breast cancer, talk with your health care provider about having an MRI and a mammogram every year.  Breast cancer (BRCA) gene test is recommended for women who have family members with BRCA-related cancers. Results of the assessment will determine the need for genetic counseling and BRCA1 and for BRCA2 testing. BRCA-related cancers include these types: ? Breast. This occurs in males or females. ? Ovarian. ? Tubal. This may also be called fallopian tube cancer. ? Cancer of the abdominal or pelvic lining (peritoneal cancer). ? Prostate. ? Pancreatic. Cervical, Uterine, and Ovarian Cancer Your health care provider may recommend that you be screened regularly for cancer of the pelvic organs. These include your ovaries, uterus, and vagina. This screening involves a pelvic exam, which includes checking for microscopic changes to the surface of your cervix (Pap test).  For women ages 21-65, health care providers may recommend a pelvic exam and a Pap test every three years. For women ages 39-65, they may recommend the Pap test and pelvic exam, combined with testing for human papilloma virus (HPV), every five years. Some types of HPV increase your risk of cervical cancer. Testing for HPV may also be done on women of any age who have unclear Pap test results.  Other health care providers may not recommend any screening for nonpregnant women who are considered low risk for pelvic cancer and have no symptoms. Ask your health care provider if a screening pelvic exam is right for you.  If you have had past treatment for cervical cancer or a condition that could lead to cancer, you need Pap tests and screening for cancer for at least 20 years after your treatment. If Pap tests have been discontinued for you, your risk factors (such as having a new sexual partner) need to be reassessed  to determine if you should start having screenings again. Some women have medical problems that increase the chance of getting cervical cancer. In these cases, your health care provider may recommend that you have screening and Pap tests more often.  If you have a family history of uterine cancer or ovarian cancer, talk with your health care provider about genetic screening.  If you have vaginal bleeding after reaching menopause, tell your health care provider.  There are currently no reliable tests available to screen for ovarian cancer. Lung Cancer Lung cancer screening is recommended for adults 57-50 years old who are at high risk for lung cancer because of a history of smoking. A yearly low-dose CT scan of the lungs is recommended if you:  Currently smoke.  Have a history of at least 30 pack-years of smoking and you currently smoke or have quit within the past 15 years. A pack-year is smoking an average of one pack of cigarettes per day for one year. Yearly screening should:  Continue until it has been 15 years since you quit.  Stop if you develop a health problem that would prevent you from having lung cancer treatment. Colorectal Cancer  This type of cancer can be detected and can often be prevented.  Routine colorectal cancer screening usually begins at age 12 and continues through  age 63.  If you have risk factors for colon cancer, your health care provider may recommend that you be screened at an earlier age.  If you have a family history of colorectal cancer, talk with your health care provider about genetic screening.  Your health care provider may also recommend using home test kits to check for hidden blood in your stool.  A small camera at the end of a tube can be used to examine your colon directly (sigmoidoscopy or colonoscopy). This is done to check for the earliest forms of colorectal cancer.  Direct examination of the colon should be repeated every 5-10 years until  age 75. However, if early forms of precancerous polyps or small growths are found or if you have a family history or genetic risk for colorectal cancer, you may need to be screened more often. Skin Cancer  Check your skin from head to toe regularly.  Monitor any moles. Be sure to tell your health care provider: ? About any new moles or changes in moles, especially if there is a change in a mole's shape or color. ? If you have a mole that is larger than the size of a pencil eraser.  If any of your family members has a history of skin cancer, especially at a young age, talk with your health care provider about genetic screening.  Always use sunscreen. Apply sunscreen liberally and repeatedly throughout the day.  Whenever you are outside, protect yourself by wearing long sleeves, pants, a wide-brimmed hat, and sunglasses. What should I know about osteoporosis? Osteoporosis is a condition in which bone destruction happens more quickly than new bone creation. After menopause, you may be at an increased risk for osteoporosis. To help prevent osteoporosis or the bone fractures that can happen because of osteoporosis, the following is recommended:  If you are 59-59 years old, get at least 1,000 mg of calcium and at least 600 mg of vitamin D per day.  If you are older than age 36 but younger than age 32, get at least 1,200 mg of calcium and at least 600 mg of vitamin D per day.  If you are older than age 47, get at least 1,200 mg of calcium and at least 800 mg of vitamin D per day. Smoking and excessive alcohol intake increase the risk of osteoporosis. Eat foods that are rich in calcium and vitamin D, and do weight-bearing exercises several times each week as directed by your health care provider. What should I know about how menopause affects my mental health? Depression may occur at any age, but it is more common as you become older. Common symptoms of depression include:  Low or sad mood.   Changes in sleep patterns.  Changes in appetite or eating patterns.  Feeling an overall lack of motivation or enjoyment of activities that you previously enjoyed.  Frequent crying spells. Talk with your health care provider if you think that you are experiencing depression. What should I know about immunizations? It is important that you get and maintain your immunizations. These include:  Tetanus, diphtheria, and pertussis (Tdap) booster vaccine.  Influenza every year before the flu season begins.  Pneumonia vaccine.  Shingles vaccine. Your health care provider may also recommend other immunizations. This information is not intended to replace advice given to you by your health care provider. Make sure you discuss any questions you have with your health care provider. Document Released: 07/25/2005 Document Revised: 12/21/2015 Document Reviewed: 03/06/2015 Elsevier Interactive Patient Education  2019 [place].

## 2018-11-15 NOTE — Assessment & Plan Note (Signed)
Continue Duexis/Meloxicam, Methocarbamol, Voltaren Gel and Tramadol as prescribed Discussed how weight loss could help improve her joint pain She will continue to follow with pain management.

## 2018-11-15 NOTE — Assessment & Plan Note (Signed)
Discussed her need for weight loss She does not feel like she can exercise until she gets her pain in better control Consider discussing bariatric surgery at the next visit

## 2018-11-15 NOTE — Assessment & Plan Note (Signed)
Will start Ergocalciferol 50000 units weekly x 12 weeks Advised her to take 800 mg of Calcium daily  Will have her set up lab only appt in 3 months for Vit D level

## 2018-11-16 ENCOUNTER — Encounter: Payer: Self-pay | Admitting: Internal Medicine

## 2018-11-18 ENCOUNTER — Encounter: Payer: Self-pay | Admitting: Internal Medicine

## 2018-11-26 ENCOUNTER — Encounter: Payer: Self-pay | Admitting: Internal Medicine

## 2018-12-10 ENCOUNTER — Encounter: Payer: Self-pay | Admitting: Internal Medicine

## 2018-12-10 MED ORDER — DUEXIS 800-26.6 MG PO TABS: 1.0000 | ORAL_TABLET | Freq: Three times a day (TID) | ORAL | 1 refills | Status: DC | PRN

## 2018-12-17 ENCOUNTER — Other Ambulatory Visit: Payer: Self-pay | Admitting: Internal Medicine

## 2018-12-18 NOTE — Telephone Encounter (Addendum)
[facility] pt.Marland KitchenMarland KitchenMarland KitchenTramadol last filled 11/13/2018 #30... CPE 11/15/2018... please advise

## 2018-12-19 ENCOUNTER — Other Ambulatory Visit: Payer: Self-pay | Admitting: Internal Medicine

## 2018-12-20 MED ORDER — HYDROXYZINE HCL 10 MG PO TABS: 10.0000 mg | ORAL_TABLET | Freq: Every day | ORAL | 0 refills | Status: DC | PRN

## 2018-12-20 MED ORDER — MELOXICAM 15 MG PO TABS: 15.0000 mg | ORAL_TABLET | Freq: Every day | ORAL | 5 refills | Status: DC

## 2018-12-20 MED ORDER — TRAMADOL HCL 50 MG PO TABS: 50.0000 mg | ORAL_TABLET | Freq: Every day | ORAL | 0 refills | Status: DC | PRN

## 2018-12-27 ENCOUNTER — Other Ambulatory Visit: Payer: Self-pay | Admitting: Pain Medicine

## 2018-12-27 DIAGNOSIS — E559 Vitamin D deficiency, unspecified: Secondary | ICD-10-CM

## 2018-12-27 NOTE — Progress Notes (Signed)

## 2018-12-29 ENCOUNTER — Other Ambulatory Visit: Payer: Self-pay

## 2018-12-29 ENCOUNTER — Ambulatory Visit
Admission: RE | Admit: 2018-12-29 | Discharge: 2018-12-29 | Disposition: A | Discharge status: Home / Self Care | Payer: 59 | Source: Ambulatory Visit | Attending: Obstetrics and Gynecology | Admitting: Obstetrics and Gynecology

## 2018-12-29 DIAGNOSIS — Z1231 Encounter for screening mammogram for malignant neoplasm of breast: Secondary | ICD-10-CM

## 2018-12-30 ENCOUNTER — Telehealth: Payer: Self-pay

## 2018-12-30 NOTE — Telephone Encounter (Signed)
[facility] in [place] request refill Duexis; [place] will contact [facility] and have rx transferred. Nothing further needed.

## 2018-12-31 ENCOUNTER — Encounter: Payer: Self-pay | Admitting: Internal Medicine

## 2019-01-04 MED ORDER — DUEXIS 800-26.6 MG PO TABS: 1.0000 | ORAL_TABLET | Freq: Three times a day (TID) | ORAL | 1 refills | Status: DC | PRN

## 2019-01-15 ENCOUNTER — Encounter: Payer: Self-pay | Admitting: Internal Medicine

## 2019-01-17 ENCOUNTER — Encounter: Payer: Self-pay | Admitting: Internal Medicine

## 2019-01-17 DIAGNOSIS — E7800 Pure hypercholesterolemia, unspecified: Secondary | ICD-10-CM

## 2019-01-17 DIAGNOSIS — E559 Vitamin D deficiency, unspecified: Secondary | ICD-10-CM

## 2019-01-18 MED ORDER — FUROSEMIDE 20 MG PO TABS: 20.0000 mg | ORAL_TABLET | Freq: Every day | ORAL | 3 refills | Status: DC

## 2019-02-06 ENCOUNTER — Other Ambulatory Visit: Payer: Self-pay | Admitting: Internal Medicine

## 2019-02-24 NOTE — Addendum Note (Signed)
Addended by: Lurlean Nanny on: 02/24/2019 05:28 PM   Modules accepted: Orders

## 2019-02-25 ENCOUNTER — Ambulatory Visit (INDEPENDENT_AMBULATORY_CARE_PROVIDER_SITE_OTHER): Payer: 59

## 2019-02-25 ENCOUNTER — Other Ambulatory Visit (INDEPENDENT_AMBULATORY_CARE_PROVIDER_SITE_OTHER): Payer: 59

## 2019-02-25 DIAGNOSIS — E559 Vitamin D deficiency, unspecified: Secondary | ICD-10-CM

## 2019-02-25 DIAGNOSIS — E7800 Pure hypercholesterolemia, unspecified: Secondary | ICD-10-CM | POA: Diagnosis not present

## 2019-02-25 DIAGNOSIS — Z23 Encounter for immunization: Secondary | ICD-10-CM | POA: Diagnosis not present

## 2019-02-25 LAB — VITAMIN D 25 HYDROXY (VIT D DEFICIENCY, FRACTURES): VITD: 38.19 ng/mL (ref 30.00–100.00)

## 2019-02-25 LAB — LIPID PANEL
Cholesterol: 186 mg/dL (ref 0–200)
HDL: 52.4 mg/dL (ref >39.00)
LDL Cholesterol: 115 mg/dL — ABNORMAL HIGH (ref 0–99)
NonHDL: 133.8
Total CHOL/HDL Ratio: 4
Triglycerides: 96 mg/dL (ref 0.0–149.0)
VLDL: 19.2 mg/dL (ref 0.0–40.0)

## 2019-03-09 ENCOUNTER — Telehealth: Payer: 59 | Admitting: Pain Medicine

## 2019-03-23 ENCOUNTER — Ambulatory Visit: Payer: 59 | Admitting: Pain Medicine

## 2019-04-06 NOTE — Progress Notes (Signed)
12/29/2018- normal  Flu-UTD has a lump in left breast x 3 weeks  Had mammo in July was normal

## 2019-04-07 ENCOUNTER — Other Ambulatory Visit: Payer: Self-pay

## 2019-04-07 ENCOUNTER — Ambulatory Visit (INDEPENDENT_AMBULATORY_CARE_PROVIDER_SITE_OTHER): Payer: 59 | Admitting: Obstetrics and Gynecology

## 2019-04-07 ENCOUNTER — Encounter: Payer: Self-pay | Admitting: Obstetrics and Gynecology

## 2019-04-07 VITALS — BP 138/82 | HR 98 | Ht 65.5 in | Wt 266.0 lb

## 2019-04-07 DIAGNOSIS — Z01419 Encounter for gynecological examination (general) (routine) without abnormal findings: Secondary | ICD-10-CM

## 2019-04-07 DIAGNOSIS — N951 Menopausal and female climacteric states: Secondary | ICD-10-CM | POA: Insufficient documentation

## 2019-04-07 DIAGNOSIS — N6325 Unspecified lump in the left breast, overlapping quadrants: Secondary | ICD-10-CM | POA: Insufficient documentation

## 2019-04-07 NOTE — Progress Notes (Signed)
Obstetrics and Gynecology New Patient Evaluation  Appointment Date: 04/07/2019  OBGYN Clinic: [facility]  Primary Care Provider: Jearld Fenton  Referring Provider: Jearld Fenton, NP  Chief Complaint:  Chief Complaint  Patient presents with  . Gynecologic Exam    History of Present Illness: [name] is a 53 y.o. African-American [id number] (No LMP recorded. Patient has had a hysterectomy.), seen for the above chief complaint. Her past medical history is significant for LAVH, endometriosis.  Left breast lump: noticed it about 3-4 weeks ago and feels about the same and in same location as when first noticed, no prior h/o  Hot flashes: more intense in the past few months  No VB, discharge, issues with bowel or bladders, dyspareunia. She does note some vag dryness.   Review of Systems:  as noted in the History of Present Illness.  Patient Active Problem List   Diagnosis Date Noted  . Breast lump on left side at 9 o'clock position 04/07/2019  . Hot flash, menopausal 04/07/2019  . Gastroesophageal reflux disease without esophagitis 11/15/2018  . Vitamin D deficiency 11/15/2018  . Morbid obesity ([place]) 11/15/2018  . Chronic low back pain (Primary area of Pain) (Bilateral) (R>L) w/o sciatica 10/07/2018  . Chronic intermittent chest pain (Left) 10/07/2018  . History of rib fracture (Left) 10/07/2018  . Chronic rib pain (Fourth Area of Pain) (Left) 10/07/2018  . Chronic lower extremity pain (Secondary Area of Pain) (Bilateral) (R>L) 10/07/2018  . Chronic hip pain [facility] Area of Pain) (Bilateral) (R>L) 10/07/2018  . Chronic feet pain (Sixth Area of Pain) (Bilateral) (R>L) 10/07/2018  . Chronic neck pain (Seventh Area of Pain) (Bilateral) (R>L) 10/07/2018  . Chronic upper back pain (Eighth Area of Pain) (Bilateral) (R>L) 10/07/2018  . DDD (degenerative disc disease), cervical 10/07/2018  . Chronic shoulder pain (Left) 10/07/2018  .  Occipital headache (Bilateral) (R>L) 10/07/2018  . Cervicogenic headache (Bilateral) (R>L) 10/07/2018  . Chronic pain syndrome 10/07/2018  . Lumbar facet arthropathy (Bilateral) 10/07/2018  . Lumbar facet syndrome (Bilateral) (R>L) 10/07/2018  . Grade 1 Anterolisthesis of L4/L5 (1 mm) 10/07/2018  . Chronic hand pain (Fifth Area of Pain) (Bilateral) (R>L) 10/07/2018  . Chronic wrist pain (Bilateral) (R>L) 10/07/2018  . Seasonal allergies 09/18/2016  . Arthralgia of multiple joints 06/02/2016  . Anxiety and depression 08/03/2014  . DDD (degenerative disc disease), lumbar 08/03/2014     Past Medical History:  Past Medical History:  Diagnosis Date  . Allergy   . DDD (degenerative disc disease), lumbar   . Depression     Past Surgical History:  Past Surgical History:  Procedure Laterality Date  . ABDOMINAL HYSTERECTOMY  2008   LAVH, endometriosis ablation, mccall culdoplasty  . DIAGNOSTIC LAPAROSCOPY  2002   Hysteroscopy, diagnostic laparoscopy, extensive lysis of adhesions, endometrial ablation, RSO  . RIGHT OOPHORECTOMY  2007   with right salpingectomy  . TUBAL LIGATION      Past Obstetrical History:  OB History  Gravida Para Term Preterm AB Living  5 3 3   2 3   SAB TAB Ectopic Multiple Live Births  1 1     3     # Outcome Date GA Lbr Len/2nd Weight Sex Delivery Anes PTL Lv  5 Term      Vag-Spont   LIV  4 Term      Vag-Spont   LIV  3 Term      Vag-Spont   LIV  2 SAB  SAB     1 TAB      TAB       Past Gynecological History: As per HPI. History of HRT use: No.  Social History:  Social History   Socioeconomic History  . Marital status: Married    Spouse name: Not on file  . Number of children: Not on file  . Years of education: Not on file  . Highest education level: Not on file  Occupational History  . Not on file  Social Needs  . Financial resource strain: Not on file  . Food insecurity    Worry: Not on file    Inability: Not on file  .  Transportation needs    Medical: Not on file    Non-medical: Not on file  Tobacco Use  . Smoking status: Never Smoker  . Smokeless tobacco: Never Used  Substance and Sexual Activity  . Alcohol use: Yes    Comment: social  . Drug use: No  . Sexual activity: Yes    Partners: Male    Birth control/protection: Post-menopausal, Surgical  Lifestyle  . Physical activity    Days per week: Not on file    Minutes per session: Not on file  . Stress: Not on file  Relationships  . Social Herbalist on phone: Not on file    Gets together: Not on file    Attends religious service: Not on file    Active member of club or organization: Not on file    Attends meetings of clubs or organizations: Not on file    Relationship status: Not on file  . Intimate partner violence    Fear of current or ex partner: Not on file    Emotionally abused: Not on file    Physically abused: Not on file    Forced sexual activity: Not on file  Other Topics Concern  . Not on file  Social History Narrative  . Not on file    Family History:  Family History  Problem Relation Age of Onset  . Hyperlipidemia Mother   . Heart disease Maternal Aunt   . Stroke Maternal Aunt   . Diabetes Maternal Aunt   . Heart disease Maternal Uncle   . Stroke Maternal Uncle   . Cancer Neg Hx    Health Maintenance:  Mammogram(s): Yes.   Date: 12/2018 Colonoscopy: approximatley 10 years ago per patient  Medications Dian Queen "Pam" had no medications administered during this visit. Current Outpatient Medications  Medication Sig Dispense Refill  . albuterol (PROVENTIL HFA;VENTOLIN HFA) 108 (90 Base) MCG/ACT inhaler Inhale 2 puffs into the lungs every 6 (six) hours as needed for wheezing or shortness of breath. 1 Inhaler 0  . diclofenac sodium (VOLTAREN) 1 % GEL Apply 2 g topically 4 (four) times daily. 100 g 0  . fexofenadine-pseudoephedrine (ALLEGRA-D 24) 180-240 MG 24 hr tablet TAKE 1 TABLET BY MOUTH EVERY  DAY AS NEEDED 30 tablet 1  . fluticasone (FLONASE) 50 MCG/ACT nasal spray Place 2 sprays into both nostrils daily. 16 g 6  . hydrOXYzine (ATARAX/VISTARIL) 10 MG tablet Take 1 tablet (10 mg total) by mouth daily as needed. 90 tablet 0  . Ibuprofen-Famotidine (DUEXIS) 800-26.6 MG TABS Take 1 tablet by mouth 3 (three) times daily as needed. 90 tablet 1  . meloxicam (MOBIC) 15 MG tablet Take 1 tablet (15 mg total) by mouth daily. MUST SCHEDULE ANNUAL PHYSICAL 30 tablet 5  . methocarbamol (ROBAXIN) 500 MG tablet Take  1 tablet (500 mg total) by mouth at bedtime as needed for muscle spasms. 20 tablet 0  . traMADol (ULTRAM) 50 MG tablet Take 1 tablet (50 mg total) by mouth daily as needed. 30 tablet 0  . furosemide (LASIX) 20 MG tablet Take 1 tablet (20 mg total) by mouth daily. (Patient not taking: Reported on 04/07/2019) 30 tablet 3  . hydrochlorothiazide (HYDRODIURIL) 25 MG tablet Take 1 tablet (25 mg total) by mouth daily. 30 tablet 2  . Vitamin D, Ergocalciferol, (DRISDOL) 1.25 MG (50000 UT) CAPS capsule Take 1 capsule (50,000 Units total) by mouth every 7 (seven) days. 12 capsule 0   No current facility-administered medications for this visit.     Allergies Orange concentrate [flavoring agent], Gabapentin, and Penicillins   Physical Exam:  BP 138/82   Pulse 98   Ht 5' 5.5" (1.664 m)   Wt 266 lb (120.7 kg)   BMI 43.59 kg/m  Body mass index is 43.59 kg/m. General appearance: Well nourished, well developed female in no acute distress.  Neck:  Supple, normal appearance, and no thyromegaly  Cardiovascular: normal s1 and s2.  No murmurs, rubs or gallops. Respiratory:  Clear to auscultation bilateral. Normal respiratory effort Abdomen: positive bowel sounds and no masses, hernias; diffusely non tender to palpation, non distended Breasts: normal on inspection bilaterally and on palpation. No masses felt, normal nipples bilaterally.  Neuro/Psych:  Normal mood and affect.  Skin:  Warm and dry.   Lymphatic:  No inguinal lymphadenopathy.   Pelvic exam: is not limited by body habitus EGBUS: within normal limits, Vagina: within normal limits and with no blood or discharge in the vault, Cervix: surgically absent, normal vag cuff. Uterus:  Surgically absent and non tender and Adnexa:  normal adnexa and no mass, fullness, tenderness Rectovaginal: deferred  Laboratory: none  Radiology: none  Assessment: pt doing well  Plan:  1. Hot flash, menopausal Likely going through menopause. Behavioral interventions d/w her, including lubrication PRN  2. Breast lump on left side at 9 o'clock position Normal exam today and mammo this year. Will set up for additional breast imaging - MM Digital Diagnostic Unilat L; Future - US BREAST COMPLETE UNI LEFT INC AXILLA; Future  3. Women's annual routine gynecological examination Routine care. paps not indicated Referred to GI for screening colonoscopy.  - Ambulatory referral to Gastroenterology  Orders Placed This Encounter  Procedures  . MM Digital Diagnostic Unilat L  . US BREAST COMPLETE UNI LEFT INC AXILLA  . Ambulatory referral to Gastroenterology    RTC 1 year and PRN  Durene Romans MD Attending [facility])

## 2019-04-08 ENCOUNTER — Other Ambulatory Visit: Payer: Self-pay | Admitting: Internal Medicine

## 2019-04-08 ENCOUNTER — Other Ambulatory Visit: Payer: Self-pay

## 2019-04-08 MED ORDER — TRAMADOL HCL 50 MG PO TABS: 50.0000 mg | ORAL_TABLET | Freq: Every day | ORAL | 0 refills | Status: DC | PRN

## 2019-04-08 MED ORDER — DICLOFENAC SODIUM 1 % TD GEL: 2.0000 g | Freq: Four times a day (QID) | TRANSDERMAL | 0 refills | Status: DC

## 2019-04-08 MED ORDER — HYDROXYZINE HCL 10 MG PO TABS: 10.0000 mg | ORAL_TABLET | Freq: Every day | ORAL | 0 refills | Status: DC | PRN

## 2019-04-08 NOTE — Telephone Encounter (Signed)
Tramadol lst filled 12-20-18 #30 Last OV 11-15-18 No future OV [facility]

## 2019-04-14 ENCOUNTER — Ambulatory Visit
Admission: RE | Admit: 2019-04-14 | Discharge: 2019-04-14 | Disposition: A | Discharge status: Home / Self Care | Payer: 59 | Source: Ambulatory Visit | Attending: Obstetrics and Gynecology | Admitting: Obstetrics and Gynecology

## 2019-04-14 ENCOUNTER — Other Ambulatory Visit: Payer: Self-pay

## 2019-04-14 ENCOUNTER — Other Ambulatory Visit: Payer: Self-pay | Admitting: Obstetrics and Gynecology

## 2019-04-14 DIAGNOSIS — N6325 Unspecified lump in the left breast, overlapping quadrants: Secondary | ICD-10-CM

## 2019-05-17 ENCOUNTER — Encounter: Payer: Self-pay | Admitting: Internal Medicine

## 2019-05-26 ENCOUNTER — Other Ambulatory Visit: Payer: 59

## 2019-06-13 ENCOUNTER — Other Ambulatory Visit: Payer: Self-pay | Admitting: Internal Medicine

## 2019-06-13 ENCOUNTER — Other Ambulatory Visit: Payer: Self-pay | Admitting: Obstetrics and Gynecology

## 2019-06-13 ENCOUNTER — Ambulatory Visit
Admission: RE | Admit: 2019-06-13 | Discharge: 2019-06-13 | Disposition: A | Discharge status: Home / Self Care | Payer: 59 | Source: Ambulatory Visit | Attending: Obstetrics and Gynecology | Admitting: Obstetrics and Gynecology

## 2019-06-13 ENCOUNTER — Other Ambulatory Visit: Payer: Self-pay

## 2019-06-13 DIAGNOSIS — N6325 Unspecified lump in the left breast, overlapping quadrants: Secondary | ICD-10-CM

## 2019-06-14 MED ORDER — HYDROXYZINE HCL 10 MG PO TABS: 10.0000 mg | ORAL_TABLET | Freq: Every day | ORAL | 0 refills | Status: DC | PRN

## 2019-06-14 MED ORDER — TRAMADOL HCL 50 MG PO TABS: 50.0000 mg | ORAL_TABLET | Freq: Every day | ORAL | 0 refills | Status: DC | PRN

## 2019-06-14 MED ORDER — METHOCARBAMOL 500 MG PO TABS: 500.0000 mg | ORAL_TABLET | Freq: Every evening | ORAL | 0 refills | Status: DC | PRN

## 2019-06-14 NOTE — Telephone Encounter (Signed)
Tramadol last filled 04/08/2019, Methocarbamol last filled 08/23/2018, Hydroxyzine last filled 04/08/2019... please advise

## 2019-06-17 HISTORY — PX: BREAST BIOPSY: SHX20

## 2019-06-20 ENCOUNTER — Other Ambulatory Visit: Payer: Self-pay | Admitting: Internal Medicine

## 2019-06-20 ENCOUNTER — Encounter: Payer: Self-pay | Admitting: Internal Medicine

## 2019-06-20 ENCOUNTER — Other Ambulatory Visit: Payer: 59

## 2019-06-21 MED ORDER — DUEXIS 800-26.6 MG PO TABS: 1.0000 | ORAL_TABLET | Freq: Three times a day (TID) | ORAL | 1 refills | Status: DC | PRN

## 2019-06-21 NOTE — Telephone Encounter (Signed)
Name of Medication: tramadol 50 mg  Name of Pharmacy: [facility] or Written Date and Quantity: # 30 on 06/14/19 Last Office Visit and Type: 11/15/18 annual Next Office Visit and Type: none scheduled

## 2019-06-21 NOTE — Telephone Encounter (Signed)
Patient called about her refill request.  She is going to contact the pharmacy about the Tramadol because it looks like it was sent on the 29th.   Patient is still needing a refill sent for her DUEXIS, she said she was in a lot of pain

## 2019-06-22 ENCOUNTER — Encounter: Payer: Self-pay | Admitting: Pain Medicine

## 2019-06-22 ENCOUNTER — Encounter: Payer: Self-pay | Admitting: Internal Medicine

## 2019-06-22 ENCOUNTER — Telehealth: Payer: Self-pay | Admitting: *Deleted

## 2019-06-22 NOTE — Telephone Encounter (Signed)
Attempted to call for pre appointment review of allergies/meds. Message left. 

## 2019-06-22 NOTE — Telephone Encounter (Signed)
This was filled 1 week ago. Denied

## 2019-06-23 ENCOUNTER — Other Ambulatory Visit: Payer: Self-pay

## 2019-06-23 ENCOUNTER — Ambulatory Visit: Payer: 59 | Attending: Pain Medicine | Admitting: Pain Medicine

## 2019-06-23 DIAGNOSIS — M542 Cervicalgia: Secondary | ICD-10-CM | POA: Insufficient documentation

## 2019-06-23 DIAGNOSIS — M50122 Cervical disc disorder at C5-C6 level with radiculopathy: Secondary | ICD-10-CM

## 2019-06-23 DIAGNOSIS — M792 Neuralgia and neuritis, unspecified: Secondary | ICD-10-CM

## 2019-06-23 DIAGNOSIS — M7989 Other specified soft tissue disorders: Secondary | ICD-10-CM

## 2019-06-23 DIAGNOSIS — M79602 Pain in left arm: Secondary | ICD-10-CM

## 2019-06-23 DIAGNOSIS — M5030 Other cervical disc degeneration, unspecified cervical region: Secondary | ICD-10-CM

## 2019-06-23 DIAGNOSIS — M5412 Radiculopathy, cervical region: Secondary | ICD-10-CM

## 2019-06-23 DIAGNOSIS — M4802 Spinal stenosis, cervical region: Secondary | ICD-10-CM | POA: Insufficient documentation

## 2019-06-23 DIAGNOSIS — G894 Chronic pain syndrome: Secondary | ICD-10-CM

## 2019-06-23 MED ORDER — PREDNISONE 20 MG PO TABS: ORAL_TABLET | ORAL | 0 refills | Status: AC
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
## 2019-06-24 ENCOUNTER — Ambulatory Visit
Admission: RE | Admit: 2019-06-24 | Discharge: 2019-06-24 | Disposition: A | Discharge status: Home / Self Care | Payer: 59 | Source: Ambulatory Visit | Attending: Obstetrics and Gynecology | Admitting: Obstetrics and Gynecology

## 2019-06-24 ENCOUNTER — Other Ambulatory Visit: Payer: Self-pay

## 2019-06-24 ENCOUNTER — Telehealth: Payer: 59 | Admitting: Pain Medicine

## 2019-06-24 DIAGNOSIS — N6325 Unspecified lump in the left breast, overlapping quadrants: Secondary | ICD-10-CM

## 2019-06-30 ENCOUNTER — Other Ambulatory Visit: Payer: Self-pay | Admitting: Internal Medicine

## 2019-07-06 ENCOUNTER — Encounter: Payer: Self-pay | Admitting: Pain Medicine

## 2019-07-07 ENCOUNTER — Ambulatory Visit: Payer: 59 | Attending: Pain Medicine | Admitting: Pain Medicine

## 2019-07-07 ENCOUNTER — Other Ambulatory Visit: Payer: Self-pay

## 2019-07-07 DIAGNOSIS — M50122 Cervical disc disorder at C5-C6 level with radiculopathy: Secondary | ICD-10-CM

## 2019-07-07 DIAGNOSIS — M25511 Pain in right shoulder: Secondary | ICD-10-CM | POA: Diagnosis not present

## 2019-07-07 DIAGNOSIS — M542 Cervicalgia: Secondary | ICD-10-CM

## 2019-07-07 DIAGNOSIS — M4802 Spinal stenosis, cervical region: Secondary | ICD-10-CM | POA: Diagnosis not present

## 2019-07-07 DIAGNOSIS — M5030 Other cervical disc degeneration, unspecified cervical region: Secondary | ICD-10-CM

## 2019-07-07 DIAGNOSIS — M5412 Radiculopathy, cervical region: Secondary | ICD-10-CM | POA: Insufficient documentation

## 2019-07-07 NOTE — Patient Instructions (Signed)

## 2019-07-07 NOTE — Progress Notes (Signed)
Patient: [name]  Service Category: E/M  Provider: Gaspar Cola, MD  DOB: 02-Oct-1965  DOS: 07/07/2019  Location: Office  MRN: [id number]  Setting: Ambulatory outpatient  Referring Provider: Jearld Fenton, NP  Type: Established Patient  Specialty: Interventional Pain Management  PCP: Jearld Fenton, NP  Location: Remote location  Delivery: TeleHealth     Virtual Encounter - Pain Management PROVIDER NOTE: Information contained herein reflects review and annotations entered in association with encounter. Interpretation of such information and data should be left to medically-trained personnel. Information provided to patient can be located elsewhere in the medical record under "Patient Instructions". Document created using STT-dictation technology, any transcriptional errors that may result from process are unintentional.    Contact & Pharmacy Preferred: [phone] Home: [phone] (home) Mobile: [phone] (mobile) E-mail: [email or web address]  [id number] [id number]: [phone]: [phone] [place]Phone: [phone]: [phone]  Pre-screening  Ms. Stanger offered "in-person" vs "virtual" encounter. She indicated preferring virtual for this encounter.   Reason COVID-19*  Social distancing based on CDC and AMA recommendations.   I contacted PDonnella Bion 07/07/2019 via telephone.      I clearly identified myself as FGaspar Cola MD. I verified that I was speaking with the correct person using two identifiers (Name: PJaquala Fuller and date of birth: 916-Feb-1967.  Consent I sought verbal advanced consent from PDonnella Bifor virtual visit interactions. I informed Ms. Pribyl of possible security and privacy concerns, risks, and limitations  associated with providing "not-in-person" medical evaluation and management services. I also informed Ms. Madan of the availability of "in-person" appointments. Finally, I informed her that there would be a charge for the virtual visit and that she could be  personally, fully or partially, financially responsible for it. Ms. DVerbekeexpressed understanding and agreed to proceed.   Historic Elements   Ms. PKatryn Plummeris a 54y.o. year old, female patient evaluated today after her last encounter by our practice on 06/22/2019. Ms. DLechuga has a past medical history of Allergy, DDD (degenerative disc disease), lumbar, and Depression. She also  has a past surgical history that includes Abdominal hysterectomy (2008); Tubal ligation; Diagnostic laparoscopy (2002); and Right oophorectomy (2007). Ms. DManalanghas a current medication list which includes the following prescription(s): albuterol, diclofenac sodium, fexofenadine-pseudoephedrine, fluticasone, furosemide, hydroxyzine, duexis, meloxicam, methocarbamol, and tramadol. She  reports that she has never smoked. She has never used smokeless tobacco. She reports current alcohol use. She reports that she does not use drugs. Ms. DCregois allergic to orange concentrate [flavoring agent]; gabapentin; and penicillins.   HPI  Today, she is being contacted for follow-up evaluation after a steroid taper.  The patient indicates that the taper did help with some of the neck pain, but she still has pain in the left shoulder.  The plan was to see if the steroid taper would take care of the whole thing and if it did not then we would do the cervical epidural steroid injection.  It would appear that that is the next step.  Today I have talked to the patient about this and she has agreed with it.  We will go ahead and schedule her to return for a left-sided cervical epidural steroid injection under fluoroscopic guidance and IV sedation, as soon  as  available.  Pharmacotherapy Assessment  Analgesic: No opioid analgesics prescribed by our practice.  Tramadol 50 mg, 1 tab PO QD PRN (50 mg/day of tramadol) (Prescribed by Jearld Fenton, NP) MME: 5 mg/day.   Monitoring: Pharmacotherapy: No side-effects or adverse reactions reported. [place] PMP: PDMP reviewed during this encounter.       Compliance: No problems identified. Effectiveness: Clinically acceptable. Plan: Refer to "POC".  UDS: No results found for: SUMMARY Laboratory Chemistry Profile (12 mo)  Renal: 11/08/2018: BUN 12; Creatinine, Ser 0.58  Lab Results  Component Value Date   GFR 137.99 07/30/2017   GFRAA >60 11/08/2018   GFRNONAA >60 11/08/2018   Hepatic: 11/08/2018: Albumin 3.6 Lab Results  Component Value Date   AST 15 11/08/2018   ALT 15 11/08/2018   Other: 11/08/2018: CRP <0.8; Sed Rate 27; Vitamin B-12 214 02/25/2019: VITD 38.19  Note: Above Lab results reviewed.  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 This  was found to be concordant by Dr. Ammie Ferrier.   Pathology results were discussed with the patient by telephone. The  patient reported doing well after the biopsy with tenderness at the  site. Post biopsy instructions and care were reviewed and questions  were answered. The patient was encouraged to call [facility] for any additional concerns.   The patient was instructed to return for annual screening  mammography and informed a reminder notice would be sent regarding  this appointment.   Pathology results reported by Stacie Acres, RN on 06/27/2019.   Electronically Signed    By: Ammie Ferrier M.D.    On: 06/27/2019 11:36 Narrative: CLINICAL DATA:  54 year old female presenting for ultrasound-guided biopsy of a left breast mass.  EXAM: ULTRASOUND GUIDED LEFT  BREAST CORE NEEDLE BIOPSY  COMPARISON:  Previous exam(s).  FINDINGS: I met with the patient and we discussed the procedure of ultrasound-guided biopsy, including benefits and alternatives. We discussed the high likelihood of a successful procedure. We discussed the risks of the procedure, including infection, bleeding, tissue injury, clip migration, and inadequate sampling. Informed written consent was given. The usual time-out protocol was performed immediately prior to the procedure.  Lesion quadrant: Upper inner quadrant  Using sterile technique and 1% Lidocaine as local anesthetic, under direct ultrasound visualization, a 14 gauge spring-loaded device was used to perform biopsy of a mass in the left breast at 9 o'clock using an inferior approach. At the conclusion of the procedure ribbon shaped tissue marker clip was deployed into the biopsy cavity. Follow up 2 view mammogram was performed and dictated separately.  IMPRESSION: Ultrasound guided biopsy of a left breast mass at 9 o'clock. No apparent complications.  Electronically Signed: By: Ammie Ferrier M.D. On: 06/24/2019 14:42   Assessment  The primary encounter diagnosis was Cervicalgia (Left). 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  Plan of Care  Problem-specific:  No problem-specific Assessment & Plan notes found for this encounter.  I have discontinued Dian Queen "Pam"'s Vitamin D (Ergocalciferol) and hydrochlorothiazide. I am also having her maintain her albuterol, fluticasone, fexofenadine-pseudoephedrine, meloxicam, furosemide, diclofenac sodium, methocarbamol, traMADol, hydrOXYzine, and Duexis.  Pharmacotherapy (Medications  Ordered): No orders of the defined types were  placed in this encounter.  Orders:  Orders Placed This Encounter  Procedures  . Cervical Epidural Injection    Level(s): C7-T1 Laterality: Left-sided Purpose: Diagnostic/Therapeutic Indication(s): Radiculitis and cervicalgia associater with cervical degenerative disc disease.    Standing Status:   Future    Standing Expiration Date:   08/07/2019    Scheduling Instructions:     Procedure: Cervical Epidural Steroid Injection/Block     Sedation: With Sedation.     Timeframe: As soon as schedule allows    Order Specific Question:   Where will this procedure be performed?    Answer:   [facility]    Comments:   by Dr. Dossie Arbour   Follow-up plan:   Return for Procedure (w/ sedation): (L) CESI #1.      Interventional treatment options: Planned, scheduled, and/or pending:    Oral steroid taper.  I will follow-up with her in 2 weeks but if it fails, we will move onto doing a left cervical epidural steroid injection under fluoroscopic guidance.  If that also fails, we need to move onto a Cervical MRI to look at the possibility of possible of surgical intervention.   Under consideration:   Diagnostic/therapeutic left Cervical ESI #1  Diagnostic bilateral lumbar facet block  Diagnostic bilateral SI joint block  Diagnostic right L4-5 LESI  Diagnostic right L5 TFESI  Diagnostic bilateral IA hip joint injection  Diagnostic left costal NB  Diagnostic bilateral wrist and hand injections  Diagnostic bilateral feet injections  Diagnostic left cervical ESI  Diagnostic bilateral cervical facet blocks  Diagnostic bilateral greater occipital NB  Diagnostic left IA shoulder joint injections  Diagnostic left suprascapular NB    PRN Procedures:   None at this time    Recent Visits No visits were found meeting these conditions.  Showing recent visits within past 90 days and meeting all other requirements   Today's Visits Date Type Provider Dept  07/07/19 Telemedicine Milinda Pointer, MD [facility]  Showing today's visits and meeting all other requirements   Future Appointments No visits were found meeting these conditions.  Showing future appointments within next 90 days and meeting all other requirements   I discussed the assessment and treatment plan with the patient. The patient was provided an opportunity to ask questions and all were answered. The patient agreed with the plan and demonstrated an understanding of the instructions.  Patient advised to call back or seek an in-person evaluation if the symptoms or condition worsens.  Duration of encounter: 15 minutes.  Note by: Gaspar Cola, MD Date: 07/07/2019; Time: 1:27 PM

## 2019-07-19 ENCOUNTER — Other Ambulatory Visit: Payer: Self-pay | Admitting: Internal Medicine

## 2019-07-20 MED ORDER — TRAMADOL HCL 50 MG PO TABS: 50.0000 mg | ORAL_TABLET | Freq: Every day | ORAL | 0 refills | Status: DC | PRN

## 2019-07-20 NOTE — Telephone Encounter (Signed)
Last filled 06/14/2019... please advise  

## 2019-07-21 ENCOUNTER — Ambulatory Visit: Payer: 59 | Admitting: Pain Medicine

## 2019-08-19 ENCOUNTER — Other Ambulatory Visit: Payer: Self-pay

## 2019-08-19 DIAGNOSIS — R1010 Upper abdominal pain, unspecified: Secondary | ICD-10-CM | POA: Diagnosis present

## 2019-08-19 DIAGNOSIS — K529 Noninfective gastroenteritis and colitis, unspecified: Secondary | ICD-10-CM | POA: Insufficient documentation

## 2019-08-19 MED ORDER — ONDANSETRON 4 MG PO TBDP
4.0000 mg | ORAL_TABLET | Freq: Once | ORAL | Status: AC | PRN
  Administered 2019-08-19: 4 mg via ORAL
  Filled 2019-08-19: qty 1

## 2019-08-19 NOTE — ED Triage Notes (Signed)
Patient c/o upper abdominal pain radiating to back, dizziness, nausea beginning earlier today.

## 2019-08-20 ENCOUNTER — Emergency Department: Payer: 59

## 2019-08-20 ENCOUNTER — Emergency Department
Admission: EM | Admit: 2019-08-20 | Discharge: 2019-08-20 | Disposition: A | Discharge status: Home / Self Care | Payer: 59 | Attending: Emergency Medicine | Admitting: Emergency Medicine

## 2019-08-20 DIAGNOSIS — K529 Noninfective gastroenteritis and colitis, unspecified: Secondary | ICD-10-CM

## 2019-08-20 DIAGNOSIS — R109 Unspecified abdominal pain: Secondary | ICD-10-CM

## 2019-08-20 LAB — COMPREHENSIVE METABOLIC PANEL
ALT: 16 U/L (ref 0–44)
AST: 19 U/L (ref 15–41)
Albumin: 4.4 g/dL (ref 3.5–5.0)
Alkaline Phosphatase: 64 U/L (ref 38–126)
Anion gap: 6 (ref 5–15)
BUN: 9 mg/dL (ref 6–20)
CO2: 24 mmol/L (ref 22–32)
Calcium: 9.7 mg/dL (ref 8.9–10.3)
Chloride: 104 mmol/L (ref 98–111)
Creatinine, Ser: 0.53 mg/dL (ref 0.44–1.00)
GFR calc Af Amer: >60 mL/min (ref >60)
GFR calc non Af Amer: >60 mL/min (ref >60)
Glucose, Bld: 151 mg/dL — ABNORMAL HIGH (ref 70–99)
Potassium: 3.8 mmol/L (ref 3.5–5.1)
Sodium: 134 mmol/L — ABNORMAL LOW (ref 135–145)
Total Bilirubin: 0.7 mg/dL (ref 0.3–1.2)
Total Protein: 8.1 g/dL (ref 6.5–8.1)

## 2019-08-20 LAB — URINALYSIS, COMPLETE (UACMP) WITH MICROSCOPIC
Bilirubin Urine: NEGATIVE
Glucose, UA: NEGATIVE mg/dL
Hgb urine dipstick: NEGATIVE
Ketones, ur: NEGATIVE mg/dL
Leukocytes,Ua: NEGATIVE
Nitrite: NEGATIVE
Protein, ur: 30 mg/dL — AB
Specific Gravity, Urine: 1.019 (ref 1.005–1.030)
pH: 6 (ref 5.0–8.0)

## 2019-08-20 LAB — CBC
HCT: 38.5 % (ref 36.0–46.0)
Hemoglobin: 12.8 g/dL (ref 12.0–15.0)
MCH: 31.4 pg (ref 26.0–34.0)
MCHC: 33.2 g/dL (ref 30.0–36.0)
MCV: 94.4 fL (ref 80.0–100.0)
Platelets: 218 10*3/uL (ref 150–400)
RBC: 4.08 MIL/uL (ref 3.87–5.11)
RDW: 12.2 % (ref 11.5–15.5)
WBC: 8.6 10*3/uL (ref 4.0–10.5)
nRBC: 0 % (ref 0.0–0.2)

## 2019-08-20 LAB — POCT PREGNANCY, URINE: Preg Test, Ur: NEGATIVE

## 2019-08-20 LAB — LIPASE, BLOOD: Lipase: 35 U/L (ref 11–51)

## 2019-08-20 MED ORDER — OXYCODONE HCL 5 MG PO TABS
5.0000 mg | ORAL_TABLET | Freq: Once | ORAL | Status: AC
  Administered 2019-08-20: 5 mg via ORAL
  Filled 2019-08-20: qty 1

## 2019-08-20 MED ORDER — FAMOTIDINE IN NACL 20-0.9 MG/50ML-% IV SOLN
20.0000 mg | Freq: Once | INTRAVENOUS | Status: AC
  Administered 2019-08-20: 20 mg via INTRAVENOUS
  Filled 2019-08-20: qty 50

## 2019-08-20 MED ORDER — TRAMADOL HCL 50 MG PO TABS: 50.0000 mg | ORAL_TABLET | Freq: Four times a day (QID) | ORAL | 0 refills | Status: DC | PRN

## 2019-08-20 MED ORDER — ONDANSETRON HCL 4 MG/2ML IJ SOLN
4.0000 mg | Freq: Once | INTRAMUSCULAR | Status: AC
  Administered 2019-08-20: 4 mg via INTRAVENOUS
  Filled 2019-08-20: qty 2

## 2019-08-20 MED ORDER — MORPHINE SULFATE (PF) 4 MG/ML IV SOLN
4.0000 mg | Freq: Once | INTRAVENOUS | Status: AC
  Administered 2019-08-20: 4 mg via INTRAVENOUS
  Filled 2019-08-20: qty 1

## 2019-08-20 MED ORDER — ONDANSETRON 4 MG PO TBDP: 4.0000 mg | ORAL_TABLET | Freq: Three times a day (TID) | ORAL | 0 refills | Status: DC | PRN

## 2019-08-20 MED ORDER — ALUM & MAG HYDROXIDE-SIMETH 200-200-20 MG/5ML PO SUSP
30.0000 mL | Freq: Once | ORAL | Status: AC
  Administered 2019-08-20: 30 mL via ORAL
  Filled 2019-08-20: qty 30

## 2019-08-20 MED ORDER — IOHEXOL 300 MG/ML  SOLN
100.0000 mL | Freq: Once | INTRAMUSCULAR | Status: AC | PRN
  Administered 2019-08-20: 100 mL via INTRAVENOUS

## 2019-08-20 MED ORDER — KETOROLAC TROMETHAMINE 30 MG/ML IJ SOLN
15.0000 mg | Freq: Once | INTRAMUSCULAR | Status: AC
  Administered 2019-08-20: 15 mg via INTRAVENOUS
  Filled 2019-08-20: qty 1

## 2019-08-20 NOTE — Discharge Instructions (Addendum)

## 2019-08-20 NOTE — ED Provider Notes (Signed)
[facility] Emergency Department Provider Note  ____________________________________________  Time seen: Approximately 3:02 AM  I have reviewed the triage vital signs and the nursing notes.   HISTORY  Chief Complaint Abdominal Pain   HPI [name] is a 54 y.o. female with a history of chronic pain, GERD, obesity who presents for evaluation of abdominal pain.  Symptoms started 2 days ago with diarrhea.  Patient reports 4 episodes.  Today had one episode of diarrhea and at 2 PM started having nausea, vomiting, and abdominal pain.  Describes the pain is 8 out of 10, cramping, diffuse but worse in the upper quadrant, constant and radiating to her back.  Has had 4 episodes of nonbloody nonbilious emesis.  No chest pain or shortness of breath, no fever chills, no dysuria or hematuria.  Patient reports that she has passed no flatus since 2 PM.  She has had a hysterectomy and a right salpingo-oophorectomy.   Past Medical History:  Diagnosis Date   Allergy    DDD (degenerative disc disease), lumbar    Depression     Patient Active Problem List   Diagnosis Date Noted   Radicular pain of shoulder (Left) 07/07/2019   Pain and swelling of upper extremity (Left) 06/23/2019   Neurogenic pain 06/23/2019   Cervical foraminal stenosis (C5-6) (Left) 06/23/2019   Cervical radiculopathy (C6) (Left) 06/23/2019   Cervical disc disorder at C5-6 level w/ radiculopathy (Left) 06/23/2019   Cervicalgia (Left) 06/23/2019   Breast lump on left side at 9 o'clock position 04/07/2019   Hot flash, menopausal 04/07/2019   Gastroesophageal reflux disease without esophagitis 11/15/2018   Vitamin D deficiency 11/15/2018   Morbid obesity ([place]) 11/15/2018   Chronic low back pain (Primary area of Pain) (Bilateral) (R>L) w/o sciatica 10/07/2018   Chronic intermittent chest pain (Left) 10/07/2018   History of rib fracture (Left) 10/07/2018   Chronic rib pain  (Fourth Area of Pain) (Left) 10/07/2018   Chronic lower extremity pain (Secondary Area of Pain) (Bilateral) (R>L) 10/07/2018   Chronic hip pain (Tertiary Area of Pain) (Bilateral) (R>L) 10/07/2018   Chronic feet pain (Sixth Area of Pain) (Bilateral) (R>L) 10/07/2018   Chronic neck pain (Seventh Area of Pain) (Bilateral) (R>L) 10/07/2018   Chronic upper back pain (Eighth Area of Pain) (Bilateral) (R>L) 10/07/2018   DDD (degenerative disc disease), cervical 10/07/2018   Chronic shoulder pain (Left) 10/07/2018   Occipital headache (Bilateral) (R>L) 10/07/2018   Cervicogenic headache (Bilateral) (R>L) 10/07/2018   Pharmacologic therapy 10/07/2018   Disorder of skeletal system 10/07/2018   Problems influencing health status 10/07/2018   Chronic pain syndrome 10/07/2018   Lumbar facet arthropathy (Bilateral) 10/07/2018   Lumbar facet syndrome (Bilateral) (R>L) 10/07/2018   Grade 1 Anterolisthesis of L4/L5 (1 mm) 10/07/2018   Chronic hand pain (Fifth Area of Pain) (Bilateral) (R>L) 10/07/2018   Chronic wrist pain (Bilateral) (R>L) 10/07/2018   Seasonal allergies 09/18/2016   Arthralgia of multiple joints 06/02/2016   Anxiety and depression 08/03/2014   DDD (degenerative disc disease), lumbar 08/03/2014    Past Surgical History:  Procedure Laterality Date   ABDOMINAL HYSTERECTOMY  2008   LAVH, endometriosis ablation, mccall culdoplasty   DIAGNOSTIC LAPAROSCOPY  2002   Hysteroscopy, diagnostic laparoscopy, extensive lysis of adhesions, endometrial ablation, RSO   RIGHT OOPHORECTOMY  2007   with right salpingectomy   TUBAL LIGATION      Prior to Admission medications   Medication Sig Start Date End Date Taking? Authorizing Provider  albuterol (  PROVENTIL HFA;VENTOLIN HFA) 108 (90 Base) MCG/ACT inhaler Inhale 2 puffs into the lungs every 6 (six) hours as needed for wheezing or shortness of breath. 01/18/18   Jearld Fenton, NP  diclofenac sodium (VOLTAREN) 1 % GEL  Apply 2 g topically 4 (four) times daily. 04/08/19   Jearld Fenton, NP  fexofenadine-pseudoephedrine (ALLEGRA-D 24) 180-240 MG 24 hr tablet TAKE 1 TABLET BY MOUTH EVERY DAY AS NEEDED 09/17/18   Jearld Fenton, NP  fluticasone (FLONASE) 50 MCG/ACT nasal spray Place 2 sprays into both nostrils daily. 08/02/18   Jearld Fenton, NP  furosemide (LASIX) 20 MG tablet Take 1 tablet (20 mg total) by mouth daily. 01/18/19   Jearld Fenton, NP  hydrOXYzine (ATARAX/VISTARIL) 10 MG tablet Take 1 tablet (10 mg total) by mouth daily as needed. 06/14/19   Jearld Fenton, NP  Ibuprofen-Famotidine (DUEXIS) 800-26.6 MG TABS Take 1 tablet by mouth 3 (three) times daily as needed. 06/21/19   Jearld Fenton, NP  meloxicam (MOBIC) 15 MG tablet Take 1 tablet (15 mg total) by mouth daily. MUST SCHEDULE ANNUAL PHYSICAL Patient taking differently: Take 15 mg by mouth daily as needed. MUST SCHEDULE ANNUAL PHYSICAL 12/20/18   Venia Carbon, MD  methocarbamol (ROBAXIN) 500 MG tablet Take 1 tablet (500 mg total) by mouth at bedtime as needed for muscle spasms. 06/14/19   Jearld Fenton, NP  ondansetron (ZOFRAN ODT) 4 MG disintegrating tablet Take 1 tablet (4 mg total) by mouth every 8 (eight) hours as needed. 08/20/19   Rudene Re, MD  traMADol (ULTRAM) 50 MG tablet Take 1 tablet (50 mg total) by mouth every 6 (six) hours as needed. 08/20/19 08/19/20  Rudene Re, MD    Allergies Orange concentrate [flavoring agent], Gabapentin, and Penicillins  Family History  Problem Relation Age of Onset   Hyperlipidemia Mother    Heart disease Maternal Aunt    Stroke Maternal Aunt    Diabetes Maternal Aunt    Heart disease Maternal Uncle    Stroke Maternal Uncle    Cancer Neg Hx     Social History Social History   Tobacco Use   Smoking status: Never Smoker   Smokeless tobacco: Never Used  Substance Use Topics   Alcohol use: Yes    Comment: social   Drug use: No    Review of  Systems  Constitutional: Negative for fever. Eyes: Negative for visual changes. ENT: Negative for sore throat. Neck: No neck pain  Cardiovascular: Negative for chest pain. Respiratory: Negative for shortness of breath. Gastrointestinal: + abdominal pain, vomiting and diarrhea. Genitourinary: Negative for dysuria. Musculoskeletal: Negative for back pain. Skin: Negative for rash. Neurological: Negative for headaches, weakness or numbness. Psych: No SI or HI  ____________________________________________   PHYSICAL EXAM:  VITAL SIGNS: ED Triage Vitals  Enc Vitals Group     BP 08/19/19 2351 (!) 149/89     Pulse Rate 08/19/19 2351 84     Resp 08/19/19 2351 18     Temp 08/19/19 2351 98 F (36.7 C)     Temp src --      SpO2 08/19/19 2351 97 %     Weight 08/19/19 2349 265 lb (120.2 kg)     Height 08/19/19 2349 5' 5.5" (1.664 m)     Head Circumference --      Peak Flow --      Pain Score 08/19/19 2348 10     Pain Loc --      Pain  Edu? --      Excl. in [place]? --     Constitutional: Alert and oriented. Well appearing and in no apparent distress. HEENT:      Head: Normocephalic and atraumatic.         Eyes: Conjunctivae are normal. Sclera is non-icteric.       Mouth/Throat: Mucous membranes are moist.       Neck: Supple with no signs of meningismus. Cardiovascular: Regular rate and rhythm. No murmurs, gallops, or rubs. 2+ symmetrical distal pulses are present in all extremities. No JVD. Respiratory: Normal respiratory effort. Lungs are clear to auscultation bilaterally. No wheezes, crackles, or rhonchi.  Gastrointestinal: Obese, soft, diffusely tender to palpation with positive bowel sounds, no distention, no rebound or guarding  Genitourinary: No CVA tenderness. Musculoskeletal: Nontender with normal range of motion in all extremities. No edema, cyanosis, or erythema of extremities. Neurologic: Normal speech and language. Face is symmetric. Moving all extremities. No gross focal  neurologic deficits are appreciated. Skin: Skin is warm, dry and intact. No rash noted. Psychiatric: Mood and affect are normal. Speech and behavior are normal.  ____________________________________________   LABS (all labs ordered are listed, but only abnormal results are displayed)  Labs Reviewed  COMPREHENSIVE METABOLIC PANEL - Abnormal; Notable for the following components:      Result Value   Sodium 134 (*)    Glucose, Bld 151 (*)    All other components within normal limits  URINALYSIS, COMPLETE (UACMP) WITH MICROSCOPIC - Abnormal; Notable for the following components:   Color, Urine YELLOW (*)    APPearance CLEAR (*)    Protein, ur 30 (*)    Bacteria, UA RARE (*)    All other components within normal limits  LIPASE, BLOOD  CBC  POC URINE PREG, ED  POCT PREGNANCY, URINE   ____________________________________________  EKG  ED ECG REPORT I, Rudene Re, the attending physician, personally viewed and interpreted this ECG.  Normal sinus rhythm, rate of 86, normal intervals, normal axis, no ST elevations or depressions, inferior Q waves.  Unchanged from prior. ____________________________________________  RADIOLOGY  I have personally reviewed the images performed during this visit and I agree with the Radiologist's read.   Interpretation by Radiologist:  CT ABDOMEN PELVIS W CONTRAST  Result Date: 08/20/2019 CLINICAL DATA:  Abdominal pain. EXAM: CT ABDOMEN AND PELVIS WITH CONTRAST TECHNIQUE: Multidetector CT imaging of the abdomen and pelvis was performed using the standard protocol following bolus administration of intravenous contrast. CONTRAST:  162mL OMNIPAQUE IOHEXOL 300 MG/ML  SOLN COMPARISON:  December 24, 2005 FINDINGS: Lower chest: The lung bases are clear. The heart size is normal. Hepatobiliary: There is decreased hepatic attenuation suggestive of hepatic steatosis. The gallbladder is mildly distended with suggestion of mild gallbladder wall thickening.There is  no biliary ductal dilation. Pancreas: Normal contours without ductal dilatation. No peripancreatic fluid collection. Spleen: No splenic laceration or hematoma. Adrenals/Urinary Tract: --Adrenal glands: No adrenal hemorrhage. --Right kidney/ureter: No hydronephrosis or perinephric hematoma. --Left kidney/ureter: No hydronephrosis or perinephric hematoma. --Urinary bladder: Unremarkable. Stomach/Bowel: --Stomach/Duodenum: No hiatal hernia or other gastric abnormality. Normal duodenal course and caliber. --Small bowel: No dilatation or inflammation. --Colon: Rectosigmoid diverticulosis without acute inflammation. --Appendix: Normal. Vascular/Lymphatic: Normal course and caliber of the major abdominal vessels. --No retroperitoneal lymphadenopathy. --No mesenteric lymphadenopathy. --No pelvic or inguinal lymphadenopathy. Reproductive: Status post hysterectomy. No adnexal mass. Other: No ascites or free air. There is a fat containing umbilical hernia. Musculoskeletal. No acute displaced fractures. IMPRESSION: 1. The gallbladder is mildly distended  with suggestion of mild gallbladder wall thickening. Further evaluation with a dedicated right upper quadrant ultrasound is recommended. 2. Hepatic steatosis. 3. Rectosigmoid diverticulosis without evidence of acute diverticulitis. Electronically Signed   By: Constance Holster M.D.   On: 08/20/2019 02:58   US ABDOMEN LIMITED RUQ  Result Date: 08/20/2019 CLINICAL DATA:  Abdominal pain. EXAM: ULTRASOUND ABDOMEN LIMITED RIGHT UPPER QUADRANT COMPARISON:  CT from same day. FINDINGS: Gallbladder: Multiple gallstones are noted. There is no significant gallbladder wall thickening. The sonographic Percell Miller sign is negative. Common bile duct: Diameter: 4 mm Liver: Diffuse increased echogenicity with slightly heterogeneous liver. Appearance typically secondary to fatty infiltration. Fibrosis secondary consideration. No secondary findings of cirrhosis noted. No focal hepatic lesion or  intrahepatic biliary duct dilatation. Portal vein is patent on color Doppler imaging with normal direction of blood flow towards the liver. Other: None. IMPRESSION: There is cholelithiasis without secondary signs of acute cholecystitis. Hepatic steatosis. Electronically Signed   By: Constance Holster M.D.   On: 08/20/2019 03:42     ____________________________________________   PROCEDURES  Procedure(s) performed: None Procedures Critical Care performed:  None ____________________________________________   INITIAL IMPRESSION / ASSESSMENT AND PLAN / ED COURSE   54 y.o. female with a history of chronic pain, GERD, obesity who presents for evaluation of abdominal pain, vomiting and diarrhea.  Patient is well-appearing and in no distress with normal vitals, abdomen is soft and nondistended with diffuse tenderness to palpation, positive bowel sounds, no rebound or guarding.  Differential diagnosis including gastroenteritis versus colitis versus enteritis versus SBO versus pancreatitis versus cholecystitis versus cholelithiasis versus GERD versus peptic ulcer disease.   As part of my medical decision making, I reviewed the following data within the [place]  CT which showed possible abnormal gallbladder; RUQ US showing cholelithiasis with no evidence of cholecystitis. I have  reviewed the EKG and looked at the rhythm strip in the room which showed NSR. I have reviewed patient's previous medical records and PMH. Labs were reviewed by me and showed no leukocytosis, no significant electrolyte derangements, normal LFTs, normal lipase, normal T bili, UA with no evidence of UTI.  IV morphine and Zofran were given for symptom relief.   _________________________ 5:06 AM on 08/20/2019 -----------------------------------------  Patient re-evaluated with benign abdominal exam. Tolerating PO with no further episodes of vomiting. Presentation most likely due to gastroenteritis. Will dc  home on zofran and short course of tramadol for abdominal pain.  Discussed bland diet and follow-up with PCP.  Discussed my standard return precautions for new or worsening abdominal pain, fever, several episodes of vomiting, chest pain or shortness of breath.  Otherwise recommended follow-up with her primary care doctor Monday.         Please note:  Patient was evaluated in Emergency Department today for the symptoms described in the history of present illness. Patient was evaluated in the context of the global COVID-19 pandemic, which necessitated consideration that the patient might be at risk for infection with the SARS-CoV-2 virus that causes COVID-19. Institutional protocols and algorithms that pertain to the evaluation of patients at risk for COVID-19 are in a state of rapid change based on information released by regulatory bodies including the CDC and federal and state organizations. These policies and algorithms were followed during the patient's care in the ED.  Some ED evaluations and interventions may be delayed as a result of limited staffing during the pandemic.   ____________________________________________   FINAL CLINICAL IMPRESSION(S) / ED DIAGNOSES  Final diagnoses:  Abdominal pain  Gastroenteritis      NEW MEDICATIONS STARTED DURING THIS VISIT:  ED Discharge Orders         Ordered    traMADol (ULTRAM) 50 MG tablet  Every 6 hours PRN     08/20/19 0433    ondansetron (ZOFRAN ODT) 4 MG disintegrating tablet  Every 8 hours PRN     08/20/19 0433           Note:  This document was prepared using Dragon voice recognition software and may include unintentional dictation errors.    Alfred Levins, [place], MD 08/20/19 [phone]

## 2019-08-23 ENCOUNTER — Ambulatory Visit: Payer: 59 | Admitting: Internal Medicine

## 2019-08-23 ENCOUNTER — Encounter: Payer: Self-pay | Admitting: Internal Medicine

## 2019-08-23 ENCOUNTER — Other Ambulatory Visit: Payer: Self-pay

## 2019-08-23 VITALS — BP 124/82 | HR 91 | Temp 97.9°F | Wt 258.0 lb

## 2019-08-23 DIAGNOSIS — R1011 Right upper quadrant pain: Secondary | ICD-10-CM | POA: Diagnosis not present

## 2019-08-23 DIAGNOSIS — K760 Fatty (change of) liver, not elsewhere classified: Secondary | ICD-10-CM | POA: Diagnosis not present

## 2019-08-23 DIAGNOSIS — K8020 Calculus of gallbladder without cholecystitis without obstruction: Secondary | ICD-10-CM

## 2019-08-23 DIAGNOSIS — K529 Noninfective gastroenteritis and colitis, unspecified: Secondary | ICD-10-CM

## 2019-08-23 MED ORDER — DUEXIS 800-26.6 MG PO TABS: 1.0000 | ORAL_TABLET | Freq: Three times a day (TID) | ORAL | 1 refills | Status: DC | PRN

## 2019-08-23 NOTE — Progress Notes (Signed)
Subjective:    Patient ID: [name], female    DOB: 04-Dec-1965, 54 y.o.   MRN: [id number]  HPI  Pt presents to the clinic today for ER follow up. She went to the ER 3/6 with c/o abdominal pain, nausea, vomiting and diarrhea. CT scan of the abdomen showed some distention of the gallbladder with gallstones but no concerns for acute cholecystitis. It also showed fatty liver, diverticulosis without diverticulitis. RUQ ultrasound confirmed that there was no acute cholecystitis.  Labs were unremarkable. She was treated with Morphine and Zofran. She was discharged with prescriptions for Tramadol and Zofran. Since discharge, she was having some constipation. She has taken stool softeners and laxatives and reports her abdominal pain and nausea have resolved.   Review of Systems  Past Medical History:  Diagnosis Date  . Allergy   . DDD (degenerative disc disease), lumbar   . Depression     Current Outpatient Medications  Medication Sig Dispense Refill  . albuterol (PROVENTIL HFA;VENTOLIN HFA) 108 (90 Base) MCG/ACT inhaler Inhale 2 puffs into the lungs every 6 (six) hours as needed for wheezing or shortness of breath. 1 Inhaler 0  . diclofenac sodium (VOLTAREN) 1 % GEL Apply 2 g topically 4 (four) times daily. 100 g 0  . fexofenadine-pseudoephedrine (ALLEGRA-D 24) 180-240 MG 24 hr tablet TAKE 1 TABLET BY MOUTH EVERY DAY AS NEEDED 30 tablet 1  . fluticasone (FLONASE) 50 MCG/ACT nasal spray Place 2 sprays into both nostrils daily. 16 g 6  . furosemide (LASIX) 20 MG tablet Take 1 tablet (20 mg total) by mouth daily. 30 tablet 3  . hydrOXYzine (ATARAX/VISTARIL) 10 MG tablet Take 1 tablet (10 mg total) by mouth daily as needed. 90 tablet 0  . Ibuprofen-Famotidine (DUEXIS) 800-26.6 MG TABS Take 1 tablet by mouth 3 (three) times daily as needed. 90 tablet 1  . meloxicam (MOBIC) 15 MG tablet Take 1 tablet (15 mg total) by mouth daily. MUST SCHEDULE ANNUAL PHYSICAL (Patient taking differently:  Take 15 mg by mouth daily as needed. MUST SCHEDULE ANNUAL PHYSICAL) 30 tablet 5  . methocarbamol (ROBAXIN) 500 MG tablet Take 1 tablet (500 mg total) by mouth at bedtime as needed for muscle spasms. 20 tablet 0  . ondansetron (ZOFRAN ODT) 4 MG disintegrating tablet Take 1 tablet (4 mg total) by mouth every 8 (eight) hours as needed. 20 tablet 0  . traMADol (ULTRAM) 50 MG tablet Take 1 tablet (50 mg total) by mouth every 6 (six) hours as needed. 12 tablet 0   No current facility-administered medications for this visit.    Allergies  Allergen Reactions  . Orange Concentrate [Flavoring Agent] Rash    Rash on right side of body  . Gabapentin Other (See Comments)    Hair loss  . Penicillins Other (See Comments)    Childhood allergy Has patient had a PCN reaction causing immediate rash, facial/tongue/throat swelling, SOB or lightheadedness with hypotension: Unknown Has patient had a PCN reaction causing severe rash involving mucus membranes or skin necrosis: Unknown Has patient had a PCN reaction that required hospitalization: No  Has patient had a PCN reaction occurring within the last 10 years: No  If all of the above answers are "NO", then may proceed with Cephalosporin use.     Family History  Problem Relation Age of Onset  . Hyperlipidemia Mother   . Heart disease Maternal Aunt   . Stroke Maternal Aunt   . Diabetes Maternal Aunt   . Heart disease Maternal  Uncle   . Stroke Maternal Uncle   . Cancer Neg Hx     Social History   Socioeconomic History  . Marital status: Married    Spouse name: Not on file  . Number of children: Not on file  . Years of education: Not on file  . Highest education level: Not on file  Occupational History  . Not on file  Tobacco Use  . Smoking status: Never Smoker  . Smokeless tobacco: Never Used  Substance and Sexual Activity  . Alcohol use: Yes    Comment: social  . Drug use: No  . Sexual activity: Yes    Partners: Male    Birth  control/protection: Post-menopausal, Surgical  Other Topics Concern  . Not on file  Social History Narrative  . Not on file   Social Determinants of Health   Financial Resource Strain:   . Difficulty of Paying Living Expenses: Not on file  Food Insecurity:   . Worried About Charity fundraiser in the Last Year: Not on file  . Ran Out of Food in the Last Year: Not on file  Transportation Needs:   . Lack of Transportation (Medical): Not on file  . Lack of Transportation (Non-Medical): Not on file  Physical Activity:   . Days of Exercise per Week: Not on file  . Minutes of Exercise per Session: Not on file  Stress:   . Feeling of Stress : Not on file  Social Connections:   . Frequency of Communication with Friends and Family: Not on file  . Frequency of Social Gatherings with Friends and Family: Not on file  . Attends Religious Services: Not on file  . Active Member of Clubs or Organizations: Not on file  . Attends Archivist Meetings: Not on file  . Marital Status: Not on file  Intimate Partner Violence:   . Fear of Current or Ex-Partner: Not on file  . Emotionally Abused: Not on file  . Physically Abused: Not on file  . Sexually Abused: Not on file     Constitutional: Denies fever, malaise, fatigue, headache or abrupt weight changes.  Respiratory: Denies difficulty breathing, shortness of breath, cough or sputum production.   Cardiovascular: Denies chest pain, chest tightness, palpitations or swelling in the hands or feet.  Gastrointestinal: Denies abdominal pain, bloating, constipation, diarrhea or blood in the stool.   No other specific complaints in a complete review of systems (except as listed in HPI above).     Objective:   Physical Exam  BP 124/82   Pulse 91   Temp 97.9 F (36.6 C) (Temporal)   Wt 258 lb (117 kg)   SpO2 98%   BMI 42.28 kg/m   Wt Readings from Last 3 Encounters:  08/19/19 265 lb (120.2 kg)  04/07/19 266 lb (120.7 kg)  11/15/18  277 lb (125.6 kg)    General: Appears her stated age, obese,  in NAD. Cardiovascular: Normal rate and rhythm. S1,S2 noted.  No murmur, rubs or gallops noted.  Pulmonary/Chest: Normal effort and positive vesicular breath sounds. No respiratory distress. No wheezes, rales or ronchi noted.  Abdomen: Soft and nontender. Normal bowel sounds. No distention or masses noted. Liver, spleen and kidneys non palpable. Neurological: Alert and oriented.    BMET    Component Value Date/Time   NA 134 (L) 08/20/2019 0007   NA 142 09/18/2016 0929   K 3.8 08/20/2019 0007   CL 104 08/20/2019 0007   CO2 24 08/20/2019 0007  GLUCOSE 151 (H) 08/20/2019 0007   BUN 9 08/20/2019 0007   BUN 9 09/18/2016 0929   CREATININE 0.53 08/20/2019 0007   CALCIUM 9.7 08/20/2019 0007   GFRNONAA >60 08/20/2019 0007   GFRAA >60 08/20/2019 0007    Lipid Panel     Component Value Date/Time   CHOL 186 02/25/2019 1005   CHOL 200 (H) 03/23/2017 0844   TRIG 96.0 02/25/2019 1005   HDL 52.40 02/25/2019 1005   HDL 57 03/23/2017 0844   CHOLHDL 4 02/25/2019 1005   VLDL 19.2 02/25/2019 1005   LDLCALC 115 (H) 02/25/2019 1005   LDLCALC 124 (H) 03/23/2017 0844    CBC    Component Value Date/Time   WBC 8.6 08/20/2019 0007   RBC 4.08 08/20/2019 0007   HGB 12.8 08/20/2019 0007   HGB 13.2 09/18/2016 0929   HCT 38.5 08/20/2019 0007   HCT 38.1 09/18/2016 0929   PLT 218 08/20/2019 0007   PLT 268 09/18/2016 0929   MCV 94.4 08/20/2019 0007   MCV 94 09/18/2016 0929   MCH 31.4 08/20/2019 0007   MCHC 33.2 08/20/2019 0007   RDW 12.2 08/20/2019 0007   RDW 12.8 09/18/2016 0929   LYMPHSABS 2.8 08/03/2014 1609   EOSABS 0.1 08/03/2014 1609   BASOSABS 0.0 08/03/2014 1609    Hgb A1C Lab Results  Component Value Date   HGBA1C 6.0 11/15/2018            Assessment & Plan:   ER Follow Up for Gastroenteritis:  ER notes, labs and imaging reviewed No further intervention needed at this time Discussed referral to  general surgery if symptoms persist or worsen    Return precautions discussed Webb Silversmith, NP This visit occurred during the SARS-CoV-2 public health emergency.  Safety protocols were in place, including screening questions prior to the visit, additional usage of staff PPE, and extensive cleaning of exam room while observing appropriate contact time as indicated for disinfecting solutions.

## 2019-08-23 NOTE — Patient Instructions (Signed)
Cholelithiasis  Cholelithiasis is also called "gallstones." It is a kind of gallbladder disease. The gallbladder is an organ that stores a liquid (bile) that helps you digest fat. Gallstones may not cause symptoms (may be silent gallstones) until they cause a blockage, and then they can cause pain (gallbladder attack). Follow these instructions at home:  Take over-the-counter and prescription medicines only as told by your doctor.  Stay at a healthy weight.  Eat healthy foods. This includes: ? Eating fewer fatty foods, like fried foods. ? Eating fewer refined carbs (refined carbohydrates). Refined carbs are breads and grains that are highly processed, like white bread and white rice. Instead, choose whole grains like whole-wheat bread and brown rice. ? Eating more fiber. Almonds, fresh fruit, and beans are healthy sources of fiber.  Keep all follow-up visits as told by your doctor. This is important. Contact a doctor if:  You have sudden pain in the upper right side of your belly (abdomen). Pain might spread to your right shoulder or your chest. This may be a sign of a gallbladder attack.  You feel sick to your stomach (are nauseous).  You throw up (vomit).  You have been diagnosed with gallstones that have no symptoms and you get: ? Belly pain. ? Discomfort, burning, or fullness in the upper part of your belly (indigestion). Get help right away if:  You have sudden pain in the upper right side of your belly, and it lasts for more than 2 hours.  You have belly pain that lasts for more than 5 hours.  You have a fever or chills.  You keep feeling sick to your stomach or you keep throwing up.  Your skin or the whites of your eyes turn yellow (jaundice).  You have dark-colored pee (urine).  You have light-colored poop (stool). Summary  Cholelithiasis is also called "gallstones."  The gallbladder is an organ that stores a liquid (bile) that helps you digest fat.  Silent  gallstones are gallstones that do not cause symptoms.  A gallbladder attack may cause sudden pain in the upper right side of your belly. Pain might spread to your right shoulder or your chest. If this happens, contact your doctor.  If you have sudden pain in the upper right side of your belly that lasts for more than 2 hours, get help right away. This information is not intended to replace advice given to you by your health care provider. Make sure you discuss any questions you have with your health care provider. Document Revised: 05/15/2017 Document Reviewed: 02/17/2016 Elsevier Patient Education  2020 [employer]  

## 2019-08-30 ENCOUNTER — Telehealth: Payer: Self-pay | Admitting: Internal Medicine

## 2019-08-30 ENCOUNTER — Emergency Department: Payer: 59

## 2019-08-30 ENCOUNTER — Other Ambulatory Visit: Payer: Self-pay

## 2019-08-30 ENCOUNTER — Inpatient Hospital Stay
Admission: EM | Admit: 2019-08-30 | Discharge: 2019-09-01 | DRG: 418 | Disposition: A | Discharge status: Home / Self Care | Payer: 59 | Attending: Internal Medicine | Admitting: Internal Medicine

## 2019-08-30 ENCOUNTER — Encounter: Payer: Self-pay | Admitting: Emergency Medicine

## 2019-08-30 DIAGNOSIS — Z01818 Encounter for other preprocedural examination: Secondary | ICD-10-CM

## 2019-08-30 DIAGNOSIS — Z880 Allergy status to penicillin: Secondary | ICD-10-CM

## 2019-08-30 DIAGNOSIS — K8050 Calculus of bile duct without cholangitis or cholecystitis without obstruction: Secondary | ICD-10-CM | POA: Insufficient documentation

## 2019-08-30 DIAGNOSIS — M4316 Spondylolisthesis, lumbar region: Secondary | ICD-10-CM | POA: Diagnosis present

## 2019-08-30 DIAGNOSIS — K219 Gastro-esophageal reflux disease without esophagitis: Secondary | ICD-10-CM | POA: Diagnosis present

## 2019-08-30 DIAGNOSIS — Z791 Long term (current) use of non-steroidal anti-inflammatories (NSAID): Secondary | ICD-10-CM

## 2019-08-30 DIAGNOSIS — E876 Hypokalemia: Secondary | ICD-10-CM

## 2019-08-30 DIAGNOSIS — K811 Chronic cholecystitis: Secondary | ICD-10-CM | POA: Diagnosis not present

## 2019-08-30 DIAGNOSIS — Z90721 Acquired absence of ovaries, unilateral: Secondary | ICD-10-CM | POA: Diagnosis not present

## 2019-08-30 DIAGNOSIS — K8020 Calculus of gallbladder without cholecystitis without obstruction: Secondary | ICD-10-CM | POA: Diagnosis present

## 2019-08-30 DIAGNOSIS — J302 Other seasonal allergic rhinitis: Secondary | ICD-10-CM | POA: Diagnosis present

## 2019-08-30 DIAGNOSIS — R1011 Right upper quadrant pain: Secondary | ICD-10-CM | POA: Diagnosis present

## 2019-08-30 DIAGNOSIS — M51369 Other intervertebral disc degeneration, lumbar region without mention of lumbar back pain or lower extremity pain: Secondary | ICD-10-CM | POA: Diagnosis present

## 2019-08-30 DIAGNOSIS — K8064 Calculus of gallbladder and bile duct with chronic cholecystitis without obstruction: Principal | ICD-10-CM | POA: Diagnosis present

## 2019-08-30 DIAGNOSIS — M47896 Other spondylosis, lumbar region: Secondary | ICD-10-CM | POA: Diagnosis present

## 2019-08-30 DIAGNOSIS — K760 Fatty (change of) liver, not elsewhere classified: Secondary | ICD-10-CM | POA: Diagnosis present

## 2019-08-30 DIAGNOSIS — R112 Nausea with vomiting, unspecified: Secondary | ICD-10-CM

## 2019-08-30 DIAGNOSIS — M5136 Other intervertebral disc degeneration, lumbar region: Secondary | ICD-10-CM | POA: Diagnosis present

## 2019-08-30 DIAGNOSIS — G894 Chronic pain syndrome: Secondary | ICD-10-CM | POA: Diagnosis present

## 2019-08-30 DIAGNOSIS — Z90710 Acquired absence of both cervix and uterus: Secondary | ICD-10-CM | POA: Diagnosis not present

## 2019-08-30 DIAGNOSIS — K660 Peritoneal adhesions (postprocedural) (postinfection): Secondary | ICD-10-CM | POA: Diagnosis present

## 2019-08-30 DIAGNOSIS — Z79899 Other long term (current) drug therapy: Secondary | ICD-10-CM

## 2019-08-30 DIAGNOSIS — F419 Anxiety disorder, unspecified: Secondary | ICD-10-CM | POA: Diagnosis present

## 2019-08-30 DIAGNOSIS — R52 Pain, unspecified: Secondary | ICD-10-CM

## 2019-08-30 DIAGNOSIS — Z6841 Body Mass Index (BMI) 40.0 and over, adult: Secondary | ICD-10-CM | POA: Diagnosis not present

## 2019-08-30 DIAGNOSIS — Z9079 Acquired absence of other genital organ(s): Secondary | ICD-10-CM | POA: Diagnosis not present

## 2019-08-30 DIAGNOSIS — E8809 Other disorders of plasma-protein metabolism, not elsewhere classified: Secondary | ICD-10-CM | POA: Diagnosis present

## 2019-08-30 DIAGNOSIS — F329 Major depressive disorder, single episode, unspecified: Secondary | ICD-10-CM | POA: Diagnosis present

## 2019-08-30 DIAGNOSIS — Z20822 Contact with and (suspected) exposure to covid-19: Secondary | ICD-10-CM | POA: Diagnosis present

## 2019-08-30 DIAGNOSIS — M4802 Spinal stenosis, cervical region: Secondary | ICD-10-CM | POA: Diagnosis present

## 2019-08-30 DIAGNOSIS — Z8349 Family history of other endocrine, nutritional and metabolic diseases: Secondary | ICD-10-CM

## 2019-08-30 DIAGNOSIS — Z888 Allergy status to other drugs, medicaments and biological substances status: Secondary | ICD-10-CM

## 2019-08-30 DIAGNOSIS — Z823 Family history of stroke: Secondary | ICD-10-CM

## 2019-08-30 DIAGNOSIS — M5010 Cervical disc disorder with radiculopathy, unspecified cervical region: Secondary | ICD-10-CM | POA: Diagnosis present

## 2019-08-30 DIAGNOSIS — Z8249 Family history of ischemic heart disease and other diseases of the circulatory system: Secondary | ICD-10-CM

## 2019-08-30 DIAGNOSIS — R197 Diarrhea, unspecified: Secondary | ICD-10-CM | POA: Diagnosis present

## 2019-08-30 DIAGNOSIS — Z833 Family history of diabetes mellitus: Secondary | ICD-10-CM

## 2019-08-30 DIAGNOSIS — E66813 Obesity, class 3: Secondary | ICD-10-CM

## 2019-08-30 LAB — COMPREHENSIVE METABOLIC PANEL
ALT: 12 U/L (ref 0–44)
ALT: 16 U/L (ref 0–44)
AST: 11 U/L — ABNORMAL LOW (ref 15–41)
AST: 15 U/L (ref 15–41)
Albumin: 2.4 g/dL — ABNORMAL LOW (ref 3.5–5.0)
Albumin: 3.7 g/dL (ref 3.5–5.0)
Alkaline Phosphatase: 40 U/L (ref 38–126)
Alkaline Phosphatase: 67 U/L (ref 38–126)
Anion gap: 3 — ABNORMAL LOW (ref 5–15)
Anion gap: 8 (ref 5–15)
BUN: 7 mg/dL (ref 6–20)
BUN: 7 mg/dL (ref 6–20)
CO2: 20 mmol/L — ABNORMAL LOW (ref 22–32)
CO2: 26 mmol/L (ref 22–32)
Calcium: 6 mg/dL — CL (ref 8.9–10.3)
Calcium: 9.5 mg/dL (ref 8.9–10.3)
Chloride: 122 mmol/L — ABNORMAL HIGH (ref 98–111)
Chloride: 105 mmol/L (ref 98–111)
Creatinine, Ser: <0.3 mg/dL — ABNORMAL LOW (ref 0.44–1.00)
Creatinine, Ser: 0.47 mg/dL (ref 0.44–1.00)
GFR calc Af Amer: >60 mL/min (ref >60)
GFR calc non Af Amer: >60 mL/min (ref >60)
Glucose, Bld: 101 mg/dL — ABNORMAL HIGH (ref 70–99)
Glucose, Bld: 102 mg/dL — ABNORMAL HIGH (ref 70–99)
Potassium: 2.4 mmol/L — CL (ref 3.5–5.1)
Potassium: 4.5 mmol/L (ref 3.5–5.1)
Sodium: 145 mmol/L (ref 135–145)
Sodium: 139 mmol/L (ref 135–145)
Total Bilirubin: 0.4 mg/dL (ref 0.3–1.2)
Total Bilirubin: 0.6 mg/dL (ref 0.3–1.2)
Total Protein: 4.6 g/dL — ABNORMAL LOW (ref 6.5–8.1)
Total Protein: 7.4 g/dL (ref 6.5–8.1)

## 2019-08-30 LAB — CBC WITH DIFFERENTIAL/PLATELET
Abs Immature Granulocytes: 0.02 10*3/uL (ref 0.00–0.07)
Basophils Absolute: 0 10*3/uL (ref 0.0–0.1)
Basophils Relative: 1 %
Eosinophils Absolute: 0.1 10*3/uL (ref 0.0–0.5)
Eosinophils Relative: 1 %
HCT: 37.1 % (ref 36.0–46.0)
Hemoglobin: 12.1 g/dL (ref 12.0–15.0)
Immature Granulocytes: 0 %
Lymphocytes Relative: 40 %
Lymphs Abs: 2.8 10*3/uL (ref 0.7–4.0)
MCH: 31.3 pg (ref 26.0–34.0)
MCHC: 32.6 g/dL (ref 30.0–36.0)
MCV: 95.9 fL (ref 80.0–100.0)
Monocytes Absolute: 0.6 10*3/uL (ref 0.1–1.0)
Monocytes Relative: 8 %
Neutro Abs: 3.6 10*3/uL (ref 1.7–7.7)
Neutrophils Relative %: 50 %
Platelets: 240 10*3/uL (ref 150–400)
RBC: 3.87 MIL/uL (ref 3.87–5.11)
RDW: 12.2 % (ref 11.5–15.5)
WBC: 7.1 10*3/uL (ref 4.0–10.5)
nRBC: 0 % (ref 0.0–0.2)

## 2019-08-30 LAB — PROTIME-INR
INR: 1 (ref 0.8–1.2)
Prothrombin Time: 12.9 seconds (ref 11.4–15.2)

## 2019-08-30 LAB — LIPASE, BLOOD: Lipase: 35 U/L (ref 11–51)

## 2019-08-30 LAB — HIV ANTIBODY (ROUTINE TESTING W REFLEX): HIV Screen 4th Generation wRfx: NONREACTIVE

## 2019-08-30 LAB — TYPE AND SCREEN
ABO/RH(D): B POS
Antibody Screen: NEGATIVE

## 2019-08-30 LAB — SARS CORONAVIRUS 2 (TAT 6-24 HRS): SARS Coronavirus 2: NEGATIVE

## 2019-08-30 LAB — PHOSPHORUS: Phosphorus: 2.3 mg/dL — ABNORMAL LOW (ref 2.5–4.6)

## 2019-08-30 LAB — APTT: aPTT: 24 seconds (ref 24–36)

## 2019-08-30 LAB — MAGNESIUM: Magnesium: 1.4 mg/dL — ABNORMAL LOW (ref 1.7–2.4)

## 2019-08-30 MED ORDER — DICLOFENAC SODIUM 1 % TD GEL
2.0000 g | Freq: Four times a day (QID) | TRANSDERMAL | Status: DC | PRN
  Filled 2019-08-30: qty 100

## 2019-08-30 MED ORDER — MAGNESIUM SULFATE 2 GM/50ML IV SOLN
2.0000 g | Freq: Once | INTRAVENOUS | Status: AC
  Administered 2019-08-30: 2 g via INTRAVENOUS
  Filled 2019-08-30: qty 50

## 2019-08-30 MED ORDER — SODIUM CHLORIDE 0.9 % IV SOLN
1.0000 g | Freq: Once | INTRAVENOUS | Status: DC
  Filled 2019-08-30: qty 10

## 2019-08-30 MED ORDER — MORPHINE SULFATE (PF) 4 MG/ML IV SOLN
4.0000 mg | Freq: Once | INTRAVENOUS | Status: AC
  Administered 2019-08-30: 4 mg via INTRAVENOUS
  Filled 2019-08-30: qty 1

## 2019-08-30 MED ORDER — POTASSIUM CHLORIDE CRYS ER 20 MEQ PO TBCR
40.0000 meq | EXTENDED_RELEASE_TABLET | Freq: Once | ORAL | Status: AC
  Administered 2019-08-30: 40 meq via ORAL
  Filled 2019-08-30: qty 2

## 2019-08-30 MED ORDER — HYDROXYZINE HCL 10 MG PO TABS
10.0000 mg | ORAL_TABLET | Freq: Every day | ORAL | Status: DC | PRN
  Filled 2019-08-30: qty 1

## 2019-08-30 MED ORDER — FLUTICASONE PROPIONATE 50 MCG/ACT NA SUSP: 2.0000 | Freq: Every day | NASAL | Status: DC | PRN

## 2019-08-30 MED ORDER — POTASSIUM PHOSPHATES 15 MMOLE/5ML IV SOLN
20.0000 mmol | Freq: Once | INTRAVENOUS | Status: DC
  Filled 2019-08-30: qty 6.67

## 2019-08-30 MED ORDER — CALCIUM GLUCONATE-NACL 1-0.675 GM/50ML-% IV SOLN
1.0000 g | Freq: Once | INTRAVENOUS | Status: AC
  Administered 2019-08-30: 1000 mg via INTRAVENOUS
  Filled 2019-08-30: qty 50

## 2019-08-30 MED ORDER — FAMOTIDINE IN NACL 20-0.9 MG/50ML-% IV SOLN
20.0000 mg | Freq: Two times a day (BID) | INTRAVENOUS | Status: DC
  Administered 2019-08-30 – 2019-08-31 (×4): 20 mg via INTRAVENOUS
  Filled 2019-08-30 (×4): qty 50

## 2019-08-30 MED ORDER — ONDANSETRON HCL 4 MG/2ML IJ SOLN
4.0000 mg | Freq: Three times a day (TID) | INTRAMUSCULAR | Status: DC | PRN
  Administered 2019-08-31: 4 mg via INTRAVENOUS
  Filled 2019-08-30: qty 2

## 2019-08-30 MED ORDER — ONDANSETRON HCL 4 MG/2ML IJ SOLN: 4.0000 mg | INTRAMUSCULAR | Status: DC

## 2019-08-30 MED ORDER — SODIUM CHLORIDE 0.9 % IV SOLN: INTRAVENOUS | Status: DC

## 2019-08-30 MED ORDER — MORPHINE SULFATE (PF) 2 MG/ML IV SOLN
2.0000 mg | INTRAVENOUS | Status: DC | PRN
  Administered 2019-08-30 – 2019-09-01 (×5): 2 mg via INTRAVENOUS
  Filled 2019-08-30 (×5): qty 1

## 2019-08-30 MED ORDER — KETOROLAC TROMETHAMINE 30 MG/ML IJ SOLN
15.0000 mg | Freq: Once | INTRAMUSCULAR | Status: AC
  Administered 2019-08-30: 15 mg via INTRAVENOUS
  Filled 2019-08-30: qty 1

## 2019-08-30 MED ORDER — ONDANSETRON HCL 4 MG/2ML IJ SOLN
4.0000 mg | INTRAMUSCULAR | Status: AC
  Administered 2019-08-30: 4 mg via INTRAVENOUS
  Filled 2019-08-30: qty 2

## 2019-08-30 MED ORDER — TRAMADOL HCL 50 MG PO TABS: 50.0000 mg | ORAL_TABLET | Freq: Four times a day (QID) | ORAL | Status: DC | PRN

## 2019-08-30 MED ORDER — SODIUM CHLORIDE 0.9 % IV SOLN: 1.0000 g | Freq: Once | INTRAVENOUS | Status: DC

## 2019-08-30 MED ORDER — ACETAMINOPHEN 325 MG PO TABS: 650.0000 mg | ORAL_TABLET | Freq: Four times a day (QID) | ORAL | Status: DC | PRN

## 2019-08-30 MED ORDER — POTASSIUM CHLORIDE 10 MEQ/100ML IV SOLN
10.0000 meq | INTRAVENOUS | Status: AC
  Administered 2019-08-30 (×2): 10 meq via INTRAVENOUS
  Filled 2019-08-30 (×8): qty 100

## 2019-08-30 MED ORDER — ALBUTEROL SULFATE (2.5 MG/3ML) 0.083% IN NEBU: 2.5000 mg | INHALATION_SOLUTION | RESPIRATORY_TRACT | Status: DC | PRN

## 2019-08-30 MED ORDER — LACTATED RINGERS IV BOLUS
1000.0000 mL | Freq: Once | INTRAVENOUS | Status: AC
  Administered 2019-08-30: 1000 mL via INTRAVENOUS

## 2019-08-30 NOTE — H&P (Signed)
History and Physical    [name] [id number] DOB: [birth year] DOA: 08/30/2019  Referring MD/NP/PA:   PCP: Jearld Fenton, NP   Patient coming from:  The patient is coming from home.  At baseline, pt is independent for most of ADL.        Chief Complaint: Abdominal pain and diarrhea  HPI: [name] is a 54 y.o. female with medical history significant of GERD, depression with anxiety, lumbar degenerative disc disease, cervical radiculopathy, chronic pain syndrome, recently diagnosed gallstone, who presents with abdominal pain and diarrhea.  Patient has been having abdominal pain for more than 10 days.  She was recently seen in ED, and had ultrasound on 3/6 which showed gallstone.  Patient states that her abdominal pain is located in the right upper quadrant, constant, sharp, 7 out of 10 severity, radiating to the back.  Associated with nausea.  No vomiting.  No fever or chills.  Patient states that in the past 2 days she has been having diarrhea.  She has had 7 times of watery diarrhea yesterday.  No chest pain, shortness of breath, cough.  No symptoms of UTI or unilateral weakness.  ED Course: pt was found to have WBC 7.1, lipase 35, pending Covid PCR, pending pregnancy test, K 2.4, Mg 1.4, Ca 6.0, liver function (ALP 40, AST 11, ALT 12, total bilirubin 0.4), temperature normal, blood pressure 131/76, tachycardia, oxygen saturation 96% on room air.  # [id number] today showed: 1. Cholelithiasis and mild gallbladder distention but without gallbladder wall thickening or pericholecystic fluid. Unable to assess the sonographic Murphy sign in the setting of premedication. Findings are unlikely to reflect acute cholecystitis however if there is persisting clinical concern HIDA scan could be obtained.  2. Diffusely increased hepatic attenuation most compatible with hepatic steatosis.   Review of Systems:   General: no fevers, chills, no body weight gain, has poor appetite, has  fatigue HEENT: no blurry vision, hearing changes or sore throat Respiratory: no dyspnea, coughing, wheezing CV: no chest pain, no palpitations GI: has nausea, abdominal pain, diarrhea, no constipation or vomiting,  GU: no dysuria, burning on urination, increased urinary frequency, hematuria  Ext: no leg edema Neuro: no unilateral weakness, numbness, or tingling, no vision change or hearing loss Skin: no rash, no skin tear. MSK: No muscle spasm, no deformity, no limitation of range of movement in spin Heme: No easy bruising.  Travel history: No recent long distant travel.  Allergy:  Allergies  Allergen Reactions  . Orange Concentrate [Flavoring Agent] Rash    Rash on right side of body  . Gabapentin Other (See Comments)    Hair loss  . Penicillins Other (See Comments)    Childhood allergy Has patient had a PCN reaction causing immediate rash, facial/tongue/throat swelling, SOB or lightheadedness with hypotension: Unknown Has patient had a PCN reaction causing severe rash involving mucus membranes or skin necrosis: Unknown Has patient had a PCN reaction that required hospitalization: No  Has patient had a PCN reaction occurring within the last 10 years: No  If all of the above answers are "NO", then may proceed with Cephalosporin use.     Past Medical History:  Diagnosis Date  . Allergy   . DDD (degenerative disc disease), lumbar   . Depression     Past Surgical History:  Procedure Laterality Date  . ABDOMINAL HYSTERECTOMY  2008   LAVH, endometriosis ablation, mccall culdoplasty  . DIAGNOSTIC LAPAROSCOPY  2002   Hysteroscopy, diagnostic laparoscopy, extensive lysis  of adhesions, endometrial ablation, RSO  . RIGHT OOPHORECTOMY  2007   with right salpingectomy  . TUBAL LIGATION      Social History:  reports that she has never smoked. She has never used smokeless tobacco. She reports current alcohol use. She reports that she does not use drugs.  Family History:  Family  History  Problem Relation Age of Onset  . Hyperlipidemia Mother   . Heart disease Maternal Aunt   . Stroke Maternal Aunt   . Diabetes Maternal Aunt   . Heart disease Maternal Uncle   . Stroke Maternal Uncle   . Cancer Neg Hx      Prior to Admission medications   Medication Sig Start Date End Date Taking? Authorizing Provider  diclofenac sodium (VOLTAREN) 1 % GEL Apply 2 g topically 4 (four) times daily. Patient taking differently: Apply 2 g topically 4 (four) times daily as needed.  04/08/19  Yes Baity, Coralie Keens, NP  fexofenadine-pseudoephedrine (ALLEGRA-D 24) 180-240 MG 24 hr tablet TAKE 1 TABLET BY MOUTH EVERY DAY AS NEEDED 09/17/18  Yes Baity, Coralie Keens, NP  fluticasone (FLONASE) 50 MCG/ACT nasal spray Place 2 sprays into both nostrils daily. Patient taking differently: Place 2 sprays into both nostrils daily as needed.  08/02/18  Yes Jearld Fenton, NP  furosemide (LASIX) 20 MG tablet Take 1 tablet (20 mg total) by mouth daily. 01/18/19  Yes Jearld Fenton, NP  hydrOXYzine (ATARAX/VISTARIL) 10 MG tablet Take 1 tablet (10 mg total) by mouth daily as needed. 06/14/19  Yes Baity, Coralie Keens, NP  Ibuprofen-Famotidine (DUEXIS) 800-26.6 MG TABS Take 1 tablet by mouth 3 (three) times daily as needed. 08/23/19  Yes Baity, Coralie Keens, NP  ondansetron (ZOFRAN ODT) 4 MG disintegrating tablet Take 1 tablet (4 mg total) by mouth every 8 (eight) hours as needed. 08/20/19  Yes Alfred Levins, [place], MD  traMADol (ULTRAM) 50 MG tablet Take 1 tablet (50 mg total) by mouth every 6 (six) hours as needed. 08/20/19 08/19/20 Yes Veronese, [place], MD  albuterol (PROVENTIL HFA;VENTOLIN HFA) 108 (90 Base) MCG/ACT inhaler Inhale 2 puffs into the lungs every 6 (six) hours as needed for wheezing or shortness of breath. Patient not taking: Reported on 08/30/2019 01/18/18   Jearld Fenton, NP  meloxicam (MOBIC) 15 MG tablet Take 1 tablet (15 mg total) by mouth daily. MUST SCHEDULE ANNUAL PHYSICAL Patient not taking: Reported on  08/30/2019 12/20/18   Venia Carbon, MD  methocarbamol (ROBAXIN) 500 MG tablet Take 1 tablet (500 mg total) by mouth at bedtime as needed for muscle spasms. Patient not taking: Reported on 08/30/2019 06/14/19   Jearld Fenton, NP    Physical Exam: Vitals:   08/30/19 0730 08/30/19 0745 08/30/19 0800 08/30/19 0830  BP: 124/89 119/86 (!) 143/98 126/88  Pulse: 66 84 86 77  Resp: 14 15 13 16   Temp:      TempSrc:      SpO2: 94% 95% 96% 96%  Weight:      Height:       General: Not in acute distress HEENT:       Eyes: PERRL, EOMI, no scleral icterus.       ENT: No discharge from the ears and nose, no pharynx injection, no tonsillar enlargement.        Neck: No JVD, no bruit, no mass felt. Heme: No neck lymph node enlargement. Cardiac: S1/S2, RRR, No murmurs, No gallops or rubs. Respiratory: No rales, wheezing, rhonchi or rubs. GI: Soft, nondistended,  has tenderness in RUQ, no rebound pain, no organomegaly, BS present. GU: No hematuria Ext: No pitting leg edema bilaterally. 2+DP/PT pulse bilaterally. Musculoskeletal: No joint deformities, No joint redness or warmth, no limitation of ROM in spin. Skin: No rashes.  Neuro: Alert, oriented X3, cranial nerves II-XII grossly intact, moves all extremities normally. Psych: Patient is not psychotic, no suicidal or hemocidal ideation.  Labs on Admission: I have personally reviewed following labs and imaging studies  CBC: Recent Labs  Lab 08/30/19 0315  WBC 7.1  NEUTROABS 3.6  HGB 12.1  HCT 37.1  MCV 95.9  PLT [id number]   Basic Metabolic Panel: Recent Labs  Lab 08/30/19 0315  NA 145  K 2.4*  CL 122*  CO2 20*  GLUCOSE 101*  BUN 7  CREATININE <0.30*  CALCIUM 6.0*  MG 1.4*   GFR: CrCl cannot be calculated (This lab value cannot be used to calculate CrCl because it is not a number: <0.30). Liver Function Tests: Recent Labs  Lab 08/30/19 0315  AST 11*  ALT 12  ALKPHOS 40  BILITOT 0.4  PROT 4.6*  ALBUMIN 2.4*   Recent Labs    Lab 08/30/19 0315  LIPASE 35   No results for input(s): AMMONIA in the last 168 hours. Coagulation Profile: No results for input(s): INR, PROTIME in the last 168 hours. Cardiac Enzymes: No results for input(s): CKTOTAL, CKMB, CKMBINDEX, TROPONINI in the last 168 hours. BNP (last 3 results) No results for input(s): PROBNP in the last 8760 hours. HbA1C: No results for input(s): HGBA1C in the last 72 hours. CBG: No results for input(s): GLUCAP in the last 168 hours. Lipid Profile: No results for input(s): CHOL, HDL, LDLCALC, TRIG, CHOLHDL, LDLDIRECT in the last 72 hours. Thyroid Function Tests: No results for input(s): TSH, T4TOTAL, FREET4, T3FREE, THYROIDAB in the last 72 hours. Anemia Panel: No results for input(s): VITAMINB12, FOLATE, FERRITIN, TIBC, IRON, RETICCTPCT in the last 72 hours. Urine analysis:    Component Value Date/Time   COLORURINE YELLOW (A) 08/20/2019 0007   APPEARANCEUR CLEAR (A) 08/20/2019 0007   LABSPEC 1.019 08/20/2019 0007   PHURINE 6.0 08/20/2019 0007   GLUCOSEU NEGATIVE 08/20/2019 0007   HGBUR NEGATIVE 08/20/2019 0007   BILIRUBINUR NEGATIVE 08/20/2019 0007   KETONESUR NEGATIVE 08/20/2019 0007   PROTEINUR 30 (A) 08/20/2019 0007   NITRITE NEGATIVE 08/20/2019 0007   LEUKOCYTESUR NEGATIVE 08/20/2019 0007   Sepsis Labs: @LABRCNTIP (procalcitonin:4,lacticidven:4) )No results found for this or any previous visit (from the past 240 hour(s)).   Radiological Exams on Admission: US ABDOMEN LIMITED RUQ  Result Date: 08/30/2019 CLINICAL DATA:  Severe right upper quadrant pain after eating, no cholelithiasis EXAM: ULTRASOUND ABDOMEN LIMITED RIGHT UPPER QUADRANT COMPARISON:  Ultrasound 08/20/2019, CT 08/20/2019 FINDINGS: Gallbladder: Multiple layering posteriorly shadowing gallstones are seen within the gallbladder. Gallbladder wall thickness is at the upper limits of normal. Hypoattenuation along the gallbladder fossa appears to reflect fatty sparing of the  adjacent liver parenchyma rather than edematous change. Unable to assess sonographic Murphy sign as patient was medicated prior to the examination. Common bile duct: Diameter: 2.6 mm, nondilated Liver: Diffusely increased hepatic echogenicity with loss of definition of the portal triads and diminished posterior through transmission compatible with hepatic steatosis. Hypoattenuation along the gallbladder fossa compatible fatty sparing. No focal liver lesion. Portal vein is patent on color Doppler imaging with normal direction of blood flow towards the liver. Other: None. IMPRESSION: Cholelithiasis and mild gallbladder distention but without gallbladder wall thickening or pericholecystic fluid. Unable to assess  the sonographic Percell Miller sign in the setting of premedication. Findings are unlikely to reflect acute cholecystitis however if there is persisting clinical concern HIDA scan could be obtained. Diffusely increased hepatic attenuation most compatible with hepatic steatosis. Electronically Signed   By: Lovena Le M.D.   On: 08/30/2019 04:21     EKG: Independently reviewed.  Sinus rhythm, QTC 468, poor R wave progression, nonspecific T wave change.    Assessment/Plan Principal Problem:   Cholelithiasis Active Problems:   DDD (degenerative disc disease), lumbar   Gastroesophageal reflux disease without esophagitis   Acute hypokalemia   Hypocalcemia   Hypomagnesemia   Diarrhea   Cholelithiasis: Patient has continuation of abdominal pain.  No fever, chills, leukocytosis. US showed no gallbladder wall thickening or pericholecystic fluid.  General surgeon, Dr. Hampton Abbot is consulted  -Admitted to MedSurg bed as inpatient -As needed morphine for pain -As needed Zofran nausea -IV fluid: 1 L Ringer's solution, followed by 125 cc/h -Follow-up surgeon's recommendation  DDD (degenerative disc disease), lumbar: -Patient is on as needed morphine which is suitable abdominal pain -prn tramadol -As  needed Voltaren gel  Gastroesophageal reflux disease without esophagitis -IV pepcid bid  Electrolytes disturbance: hypokalemia, Hypocalcemia, hypomagnesemia: -repleted all -f/u by BMP and MG -check phosphorus level  Diarrhea:  -check C diff prn and Gi path panel -IVF as above    Inpatient status:  # Patient requires inpatient status due to high intensity of service, high risk for further deterioration and high frequency of surveillance required.  I certify that at the point of admission it is my clinical judgment that the patient will require inpatient hospital care spanning beyond 2 midnights from the point of admission.  . This patient has multiple chronic comorbidities including GERD, depression with anxiety, lumbar degenerative disc disease, cervical radiculopathy, chronic pain syndrome, recently diagnosed gallstone . Now patient has presenting with cholelithiasis, diarrhea, severe multiple electrolytes disturbance including hypokalemia, hypomagnesemia, hypocalcemia. . The worrisome physical exam findings include right upper quadrant abdominal tenderness . The initial radiographic and laboratory data are worrisome because of multiple electrolytes disturbance.  Gallstone by ultrasound . Current medical needs: please see my assessment and plan Predictability of an adverse outcome (risk): Patient has multiple comorbidities as listed above. Now presents with  cholelithiasis, diarrhea, severe multiple electrolytes disturbance including hypokalemia, hypomagnesemia, hypocalcemia. Patient's presentation is highly complicated.  Patient is at high risk of deteriorating.  Will need to be treated in hospital for at least 2 days.             DVT ppx: SCD Code Status: Full code Family Communication: not done, no family member is at bed side.    Disposition Plan:  Anticipate discharge back to previous home environment Consults called: Dr. Hampton Abbot of general surgeon Admission status:  Med-surg bed as inpt      Date of Service 08/30/2019    [facility]   If 7PM-7AM, please contact night-coverage [email or web address] 08/30/2019, 8:54 AM

## 2019-08-30 NOTE — ED Triage Notes (Addendum)
Pt presents to ED with severe right upper abd pain. Pt reports she was seen in this ED a little over a week ago for similar symptoms and was dx with gallstones. Pain improved and then started hurting again after dinner this evening. Pain increasing in severity. +nasuea tearful in triage.

## 2019-08-30 NOTE — ED Notes (Signed)
LR infusion paused to run calcium gluconate and potassium

## 2019-08-30 NOTE — ED Notes (Signed)
Surgery PA at bedside.  

## 2019-08-30 NOTE — ED Notes (Signed)
Surgeon at bedside.  

## 2019-08-30 NOTE — ED Notes (Signed)
Lab at bedside

## 2019-08-30 NOTE — Consult Note (Signed)
[facility] SURGICAL CONSULTATION NOTE (initial) - [id number]   HISTORY OF PRESENT ILLNESS (HPI):  54 y.o. female presented to [facility] ED today for evaluation of abdominal pain. Patient had initially presented to the ED on 03/06 for multiple days of diarrhea, nausea, emesis, and abdominal discomfort. Work up at that time revealed cholelithiasis without cholecystitis and she was thought to have gastroenteritis and discharged home with return precautions. She notes that she had done well following this. Yesterday she had cabbage and sausage for dinner around 8PM last night and then around 9PM she noticed the gradual onset of RUQ and epigastric abdominal pain. This was described as a sharp pain which started to become worse throughout the course of the night. This was associated with nausea. No fever, chills, cough, congestion, SOB, or CP. Her diarrhea has improved from previous presentation. She denied any history of jaundice or changes in her stools. She does believe in the past she has had similar episodes of pain but these are less severe and typically spontaneously resolved. Previous abdominal surgeries include abdominal hysterectomy, right salpingo-oophorectomy. Work up in the ED this morning was concerning for significant electrolyte derangement (Mg2+ 1.4, Phos 2.3, K+ 2.4, Ca2+ - 6.0 corrected to 7.3 with hypoalbuminemia), no leukocytosis, no LFT elevation, and RUQ [place] was concerning for cholelithiasis without significant evidence of cholecystitis. She was admitted to the hospitalist service for work up and management.    Surgery is consulted by hospitalist physician Dr. Ivor Costa, MD in this context for evaluation and management of cholelithiasis.   PAST MEDICAL HISTORY (PMH):  Past Medical History:  Diagnosis Date  . Allergy   . DDD (degenerative disc disease), lumbar   . Depression      PAST SURGICAL HISTORY ([place]):  Past Surgical History:  Procedure Laterality Date  .  ABDOMINAL HYSTERECTOMY  2008   LAVH, endometriosis ablation, mccall culdoplasty  . DIAGNOSTIC LAPAROSCOPY  2002   Hysteroscopy, diagnostic laparoscopy, extensive lysis of adhesions, endometrial ablation, RSO  . RIGHT OOPHORECTOMY  2007   with right salpingectomy  . TUBAL LIGATION       MEDICATIONS:  Prior to Admission medications   Medication Sig Start Date End Date Taking? Authorizing Provider  diclofenac sodium (VOLTAREN) 1 % GEL Apply 2 g topically 4 (four) times daily. Patient taking differently: Apply 2 g topically 4 (four) times daily as needed.  04/08/19  Yes Baity, Coralie Keens, NP  fexofenadine-pseudoephedrine (ALLEGRA-D 24) 180-240 MG 24 hr tablet TAKE 1 TABLET BY MOUTH EVERY DAY AS NEEDED 09/17/18  Yes Baity, Coralie Keens, NP  fluticasone (FLONASE) 50 MCG/ACT nasal spray Place 2 sprays into both nostrils daily. Patient taking differently: Place 2 sprays into both nostrils daily as needed.  08/02/18  Yes Jearld Fenton, NP  furosemide (LASIX) 20 MG tablet Take 1 tablet (20 mg total) by mouth daily. 01/18/19  Yes Jearld Fenton, NP  hydrOXYzine (ATARAX/VISTARIL) 10 MG tablet Take 1 tablet (10 mg total) by mouth daily as needed. 06/14/19  Yes Baity, Coralie Keens, NP  Ibuprofen-Famotidine (DUEXIS) 800-26.6 MG TABS Take 1 tablet by mouth 3 (three) times daily as needed. 08/23/19  Yes Baity, Coralie Keens, NP  ondansetron (ZOFRAN ODT) 4 MG disintegrating tablet Take 1 tablet (4 mg total) by mouth every 8 (eight) hours as needed. 08/20/19  Yes Alfred Levins, [place], MD  traMADol (ULTRAM) 50 MG tablet Take 1 tablet (50 mg total) by mouth every 6 (six) hours as needed. 08/20/19 08/19/20 Yes Rudene Re, MD  albuterol (PROVENTIL HFA;VENTOLIN HFA) 108 (90 Base) MCG/ACT inhaler Inhale 2 puffs into the lungs every 6 (six) hours as needed for wheezing or shortness of breath. Patient not taking: Reported on 08/30/2019 01/18/18   Jearld Fenton, NP  meloxicam (MOBIC) 15 MG tablet Take 1 tablet (15 mg total) by mouth daily.  MUST SCHEDULE ANNUAL PHYSICAL Patient not taking: Reported on 08/30/2019 12/20/18   Venia Carbon, MD  methocarbamol (ROBAXIN) 500 MG tablet Take 1 tablet (500 mg total) by mouth at bedtime as needed for muscle spasms. Patient not taking: Reported on 08/30/2019 06/14/19   Jearld Fenton, NP     ALLERGIES:  Allergies  Allergen Reactions  . Orange Concentrate [Flavoring Agent] Rash    Rash on right side of body  . Gabapentin Other (See Comments)    Hair loss  . Penicillins Other (See Comments)    Childhood allergy Has patient had a PCN reaction causing immediate rash, facial/tongue/throat swelling, SOB or lightheadedness with hypotension: Unknown Has patient had a PCN reaction causing severe rash involving mucus membranes or skin necrosis: Unknown Has patient had a PCN reaction that required hospitalization: No  Has patient had a PCN reaction occurring within the last 10 years: No  If all of the above answers are "NO", then may proceed with Cephalosporin use.      SOCIAL HISTORY:  Social History   Socioeconomic History  . Marital status: Married    Spouse name: Not on file  . Number of children: Not on file  . Years of education: Not on file  . Highest education level: Not on file  Occupational History  . Not on file  Tobacco Use  . Smoking status: Never Smoker  . Smokeless tobacco: Never Used  Substance and Sexual Activity  . Alcohol use: Yes    Comment: social  . Drug use: No  . Sexual activity: Yes    Partners: Male    Birth control/protection: Post-menopausal, Surgical  Other Topics Concern  . Not on file  Social History Narrative  . Not on file   Social Determinants of Health   Financial Resource Strain:   . Difficulty of Paying Living Expenses:   Food Insecurity:   . Worried About Charity fundraiser in the Last Year:   . Arboriculturist in the Last Year:   Transportation Needs:   . Film/video editor (Medical):   [name] Lack of Transportation  (Non-Medical):   Physical Activity:   . Days of Exercise per Week:   . Minutes of Exercise per Session:   Stress:   . Feeling of Stress :   Social Connections:   . Frequency of Communication with Friends and Family:   . Frequency of Social Gatherings with Friends and Family:   . Attends Religious Services:   . Active Member of Clubs or Organizations:   . Attends Archivist Meetings:   [name] Marital Status:   Intimate Partner Violence:   . Fear of Current or Ex-Partner:   . Emotionally Abused:   [name] Physically Abused:   . Sexually Abused:      FAMILY HISTORY:  Family History  Problem Relation Age of Onset  . Hyperlipidemia Mother   . Heart disease Maternal Aunt   . Stroke Maternal Aunt   . Diabetes Maternal Aunt   . Heart disease Maternal Uncle   . Stroke Maternal Uncle   . Cancer Neg Hx       REVIEW OF SYSTEMS:  Review of Systems  Constitutional: Negative for chills and fever.  HENT: Negative for congestion and sore throat.   Respiratory: Negative for cough and shortness of breath.   Cardiovascular: Negative for chest pain and palpitations.  Gastrointestinal: Positive for abdominal pain and nausea. Negative for diarrhea and vomiting.  Genitourinary: Negative for dysuria and urgency.  All other systems reviewed and are negative.   VITAL SIGNS:  Temp:  [97.8 F (36.6 C)] 97.8 F (36.6 C) (03/16 0239) Pulse Rate:  [70-100] 72 (03/16 0700) Resp:  [16-22] 17 (03/16 0700) BP: (112-158)/(74-104) 125/82 (03/16 0700) SpO2:  [94 %-100 %] 94 % (03/16 0700) Weight:  [id number] kg] 117 kg (03/16 0240)     Height: 5' 5.5" (166.4 cm) Weight: 117 kg BMI (Calculated): 42.27   INTAKE/OUTPUT:  No intake/output data recorded.  PHYSICAL EXAM:  Physical Exam Vitals and nursing note reviewed.  Constitutional:      General: She is not in acute distress.    Appearance: She is well-developed. She is obese. She is not ill-appearing.  HENT:     Head: Normocephalic and atraumatic.   Eyes:     General: No scleral icterus.    Extraocular Movements: Extraocular movements intact.     Pupils: Pupils are equal, round, and reactive to light.  Cardiovascular:     Rate and Rhythm: Normal rate and regular rhythm.     Heart sounds: Normal heart sounds. No murmur.  Pulmonary:     Effort: Pulmonary effort is normal. No respiratory distress.     Breath sounds: Normal breath sounds.  Abdominal:     General: Abdomen is flat. There is no distension.     Palpations: Abdomen is soft.     Tenderness: There is abdominal tenderness in the right upper quadrant and epigastric area. There is no guarding or rebound. Positive signs include Murphy's sign.  Genitourinary:    Comments: Deferred Skin:    General: Skin is warm and dry.     Coloration: Skin is not jaundiced or pale.  Neurological:     General: No focal deficit present.     Mental Status: She is alert and oriented to person, place, and time.  Psychiatric:        Mood and Affect: Mood normal.        Behavior: Behavior normal.      Labs:  CBC Latest Ref Rng & Units 08/30/2019 08/20/2019 11/15/2018  WBC 4.0 - 10.5 K/uL 7.1 8.6 6.0  Hemoglobin 12.0 - 15.0 g/dL 12.1 12.8 13.3  Hematocrit 36.0 - 46.0 % 37.1 38.5 38.9  Platelets 150 - 400 K/uL 240 218 220.0   CMP Latest Ref Rng & Units 08/30/2019 08/20/2019 11/08/2018  Glucose 70 - 99 mg/dL 101(H) 151(H) 134(H)  BUN 6 - 20 mg/dL 7 9 12   Creatinine 0.44 - 1.00 mg/dL <0.30(L) 0.53 0.58  Sodium 135 - 145 mmol/L 145 134(L) 139  Potassium 3.5 - 5.1 mmol/L 2.4(LL) 3.8 3.8  Chloride 98 - 111 mmol/L 122(H) 104 109  CO2 22 - 32 mmol/L 20(L) 24 22  Calcium 8.9 - 10.3 mg/dL 6.0(LL) 9.7 8.9  Total Protein 6.5 - 8.1 g/dL 4.6(L) 8.1 7.5  Total Bilirubin 0.3 - 1.2 mg/dL 0.4 0.7 0.6  Alkaline Phos 38 - 126 U/L 40 64 57  AST 15 - 41 U/L 11(L) 19 15  ALT 0 - 44 U/L 12 16 15      Imaging studies:   RUQ [place] (08/30/2019) personally reviewed which shows cholelithiasis, and radiologist report  reviewed below:  IMPRESSION: Cholelithiasis and mild gallbladder distention but without gallbladder wall thickening or pericholecystic fluid. Unable to assess the sonographic Murphy sign in the setting of premedication. Findings are unlikely to reflect acute cholecystitis however if there is persisting clinical concern HIDA scan could be obtained.  Diffusely increased hepatic attenuation most compatible with hepatic Steatosis.   Assessment/Plan: (ICD-10's: K12.20) 54 y.o. female with significant multiple electrolyte derangements as well as RUQ pain and nausea found to have cholelithiasis on [place] without radiographioc evidence of cholecystitis although she is quite tender with +Murphys Sign.   - Replete Electrolytes; will order afternoon labs to recheck   - I do think patient will benefit from laparoscopic cholecystectomy this admission, will need to replete electrolytes prior to undergoing anesthesia, timing pending --> potentially this afternoon if electrolytes can be corrected  - NPO + IVF  - No indication for IV Abx currently   - pain control prn; antiemetics  - monitor abdominal examination  - further management per primary team   All of the above findings and recommendations were discussed with the patient, and all of patient's questions were answered to her expressed satisfaction.  Thank you for the opportunity to participate in this patient's care.   -- Edison Simon, PA-C [facility] 08/30/2019, 7:36 AM [phone] M-F: 7am - 4pm

## 2019-08-30 NOTE — ED Provider Notes (Signed)
[facility] Emergency Department Provider Note  ____________________________________________   First MD Initiated Contact with Patient 08/30/19 0246     (approximate)  I have reviewed the triage vital signs and the nursing notes.   HISTORY  Chief Complaint Abdominal Pain    HPI [name] is a 54 y.o. female with medical history as listed below which notably includes multiple chronic pain syndromes for which she sees Dr. Consuela Mimes and who was diagnosed about a week ago with gastroenteritis and gallstones.  She presents tonight for acute onset and severe epigastric and right upper quadrant pain that radiates through to her back.  It is sharp and constant for the last few hours.  It started immediately after she ate.  It is accompanied with nausea but no vomiting.  She says it feels similar but much worse than it did the last time.  She has had no diarrhea at this time nor vomiting even though she has been quite sick to her stomach.  She cannot find a position of comfort.  The pain is steadily gotten worse over time.  Foods seem to make it worse and nothing in particular makes it better.  She denies fever, sore throat, chest pain, shortness of breath.        Past Medical History:  Diagnosis Date  . Allergy   . DDD (degenerative disc disease), lumbar   . Depression     Patient Active Problem List   Diagnosis Date Noted  . Cholelithiasis 08/30/2019  . Hypokalemia 08/30/2019  . Hypocalcemia 08/30/2019  . Hypomagnesemia 08/30/2019  . Radicular pain of shoulder (Left) 07/07/2019  . Neurogenic pain 06/23/2019  . Cervical foraminal stenosis (C5-6) (Left) 06/23/2019  . Cervical radiculopathy (C6) (Left) 06/23/2019  . Cervical disc disorder at C5-6 level w/ radiculopathy (Left) 06/23/2019  . Cervicalgia (Left) 06/23/2019  . Gastroesophageal reflux disease without esophagitis 11/15/2018  . Chronic low back pain (Primary area of Pain) (Bilateral) (R>L)  w/o sciatica 10/07/2018  . Chronic intermittent chest pain (Left) 10/07/2018  . Chronic rib pain (Fourth Area of Pain) (Left) 10/07/2018  . Chronic lower extremity pain (Secondary Area of Pain) (Bilateral) (R>L) 10/07/2018  . Chronic hip pain [facility] Area of Pain) (Bilateral) (R>L) 10/07/2018  . Chronic feet pain (Sixth Area of Pain) (Bilateral) (R>L) 10/07/2018  . Chronic neck pain (Seventh Area of Pain) (Bilateral) (R>L) 10/07/2018  . Chronic upper back pain (Eighth Area of Pain) (Bilateral) (R>L) 10/07/2018  . DDD (degenerative disc disease), cervical 10/07/2018  . Chronic shoulder pain (Left) 10/07/2018  . Occipital headache (Bilateral) (R>L) 10/07/2018  . Cervicogenic headache (Bilateral) (R>L) 10/07/2018  . Disorder of skeletal system 10/07/2018  . Chronic pain syndrome 10/07/2018  . Lumbar facet arthropathy (Bilateral) 10/07/2018  . Lumbar facet syndrome (Bilateral) (R>L) 10/07/2018  . Grade 1 Anterolisthesis of L4/L5 (1 mm) 10/07/2018  . Chronic hand pain (Fifth Area of Pain) (Bilateral) (R>L) 10/07/2018  . Chronic wrist pain (Bilateral) (R>L) 10/07/2018  . Seasonal allergies 09/18/2016  . Arthralgia of multiple joints 06/02/2016  . Anxiety and depression 08/03/2014  . DDD (degenerative disc disease), lumbar 08/03/2014    Past Surgical History:  Procedure Laterality Date  . ABDOMINAL HYSTERECTOMY  2008   LAVH, endometriosis ablation, mccall culdoplasty  . DIAGNOSTIC LAPAROSCOPY  2002   Hysteroscopy, diagnostic laparoscopy, extensive lysis of adhesions, endometrial ablation, RSO  . RIGHT OOPHORECTOMY  2007   with right salpingectomy  . TUBAL LIGATION      Prior to Admission medications  Medication Sig Start Date End Date Taking? Authorizing Provider  diclofenac sodium (VOLTAREN) 1 % GEL Apply 2 g topically 4 (four) times daily. Patient taking differently: Apply 2 g topically 4 (four) times daily as needed.  04/08/19  Yes Baity, Coralie Keens, NP   fexofenadine-pseudoephedrine (ALLEGRA-D 24) 180-240 MG 24 hr tablet TAKE 1 TABLET BY MOUTH EVERY DAY AS NEEDED 09/17/18  Yes Baity, Coralie Keens, NP  fluticasone (FLONASE) 50 MCG/ACT nasal spray Place 2 sprays into both nostrils daily. Patient taking differently: Place 2 sprays into both nostrils daily as needed.  08/02/18  Yes Jearld Fenton, NP  furosemide (LASIX) 20 MG tablet Take 1 tablet (20 mg total) by mouth daily. 01/18/19  Yes Jearld Fenton, NP  hydrOXYzine (ATARAX/VISTARIL) 10 MG tablet Take 1 tablet (10 mg total) by mouth daily as needed. 06/14/19  Yes Baity, Coralie Keens, NP  Ibuprofen-Famotidine (DUEXIS) 800-26.6 MG TABS Take 1 tablet by mouth 3 (three) times daily as needed. 08/23/19  Yes Baity, Coralie Keens, NP  ondansetron (ZOFRAN ODT) 4 MG disintegrating tablet Take 1 tablet (4 mg total) by mouth every 8 (eight) hours as needed. 08/20/19  Yes Alfred Levins, [place], MD  traMADol (ULTRAM) 50 MG tablet Take 1 tablet (50 mg total) by mouth every 6 (six) hours as needed. 08/20/19 08/19/20 Yes Veronese, [place], MD  albuterol (PROVENTIL HFA;VENTOLIN HFA) 108 (90 Base) MCG/ACT inhaler Inhale 2 puffs into the lungs every 6 (six) hours as needed for wheezing or shortness of breath. Patient not taking: Reported on 08/30/2019 01/18/18   Jearld Fenton, NP  meloxicam (MOBIC) 15 MG tablet Take 1 tablet (15 mg total) by mouth daily. MUST SCHEDULE ANNUAL PHYSICAL Patient not taking: Reported on 08/30/2019 12/20/18   Venia Carbon, MD  methocarbamol (ROBAXIN) 500 MG tablet Take 1 tablet (500 mg total) by mouth at bedtime as needed for muscle spasms. Patient not taking: Reported on 08/30/2019 06/14/19   Jearld Fenton, NP    Allergies Orange concentrate [flavoring agent], Gabapentin, and Penicillins  Family History  Problem Relation Age of Onset  . Hyperlipidemia Mother   . Heart disease Maternal Aunt   . Stroke Maternal Aunt   . Diabetes Maternal Aunt   . Heart disease Maternal Uncle   . Stroke Maternal Uncle    . Cancer Neg Hx     Social History Social History   Tobacco Use  . Smoking status: Never Smoker  . Smokeless tobacco: Never Used  Substance Use Topics  . Alcohol use: Yes    Comment: social  . Drug use: No    Review of Systems Constitutional: No fever/chills Eyes: No visual changes. ENT: No sore throat. Cardiovascular: Denies chest pain. Respiratory: Denies shortness of breath. Gastrointestinal: Sharp upper abdominal pain radiating through to the back associated with nausea but no vomiting. Genitourinary: Negative for dysuria. Musculoskeletal: Negative for neck pain.  Negative for back pain. Integumentary: Negative for rash. Neurological: Negative for headaches, focal weakness or numbness.   ____________________________________________   PHYSICAL EXAM:  VITAL SIGNS: ED Triage Vitals  Enc Vitals Group     BP 08/30/19 0239 (!) 158/104     Pulse Rate 08/30/19 0239 95     Resp 08/30/19 0239 (!) 22     Temp 08/30/19 0239 97.8 F (36.6 C)     Temp Source 08/30/19 0239 Oral     SpO2 08/30/19 0239 96 %     Weight 08/30/19 0240 117 kg (258 lb)     Height 08/30/19  0240 1.664 m (5' 5.5")     Head Circumference --      Peak Flow --      Pain Score 08/30/19 0240 10     Pain Loc --      Pain Edu? --      Excl. in [place]? --     Constitutional: Alert and oriented.  Patient appears to be in pain. Eyes: Conjunctivae are normal.  Head: Atraumatic. Nose: No congestion/rhinnorhea. Mouth/Throat: Patient is wearing a mask. Neck: No stridor.  No meningeal signs.   Cardiovascular: Normal rate, regular rhythm. Good peripheral circulation. Grossly normal heart sounds. Respiratory: Normal respiratory effort.  No retractions. Gastrointestinal: Obese.  Soft and nondistended.  Tender to palpation of the epigastrium and right upper quadrant with positive Murphy sign. Musculoskeletal: No lower extremity tenderness nor edema. No gross deformities of extremities. Neurologic:  Normal speech  and language. No gross focal neurologic deficits are appreciated.  Skin:  Skin is warm, dry and intact. Psychiatric: Mood and affect are normal. Speech and behavior are normal.  ____________________________________________   LABS (all labs ordered are listed, but only abnormal results are displayed)  Labs Reviewed  COMPREHENSIVE METABOLIC PANEL - Abnormal; Notable for the following components:      Result Value   Potassium 2.4 (*)    Chloride 122 (*)    CO2 20 (*)    Glucose, Bld 101 (*)    Creatinine, Ser <0.30 (*)    Calcium 6.0 (*)    Total Protein 4.6 (*)    Albumin 2.4 (*)    AST 11 (*)    Anion gap 3 (*)    All other components within normal limits  MAGNESIUM - Abnormal; Notable for the following components:   Magnesium 1.4 (*)    All other components within normal limits  SARS CORONAVIRUS 2 (TAT 6-24 HRS)  CBC WITH DIFFERENTIAL/PLATELET  LIPASE, BLOOD   ____________________________________________  EKG  ED ECG REPORT I, Hinda Kehr, the attending physician, personally viewed and interpreted this ECG.  Date: 08/30/2019 EKG Time: 2:54 AM Rate: 90 Rhythm: normal sinus rhythm QRS Axis: normal Intervals: No acute abnormalities or abnormal intervals. ST/T Wave abnormalities: Non-specific ST segment / T-wave changes, but no clear evidence of acute ischemia. Narrative Interpretation: no definitive evidence of acute ischemia; does not meet STEMI criteria.   ____________________________________________  RADIOLOGY I, Hinda Kehr, personally viewed and evaluated these images (plain radiographs) as part of my medical decision making, as well as reviewing the written report by the radiologist.  ED MD interpretation:  cholelithiasis, possible cholecystitis but not clear, radiologist recommended HIDA scan.  Official radiology report(s): US ABDOMEN LIMITED RUQ  Result Date: 08/30/2019 CLINICAL DATA:  Severe right upper quadrant pain after eating, no cholelithiasis  EXAM: ULTRASOUND ABDOMEN LIMITED RIGHT UPPER QUADRANT COMPARISON:  Ultrasound 08/20/2019, CT 08/20/2019 FINDINGS: Gallbladder: Multiple layering posteriorly shadowing gallstones are seen within the gallbladder. Gallbladder wall thickness is at the upper limits of normal. Hypoattenuation along the gallbladder fossa appears to reflect fatty sparing of the adjacent liver parenchyma rather than edematous change. Unable to assess sonographic Murphy sign as patient was medicated prior to the examination. Common bile duct: Diameter: 2.6 mm, nondilated Liver: Diffusely increased hepatic echogenicity with loss of definition of the portal triads and diminished posterior through transmission compatible with hepatic steatosis. Hypoattenuation along the gallbladder fossa compatible fatty sparing. No focal liver lesion. Portal vein is patent on color Doppler imaging with normal direction of blood flow towards the liver. Other: None.  IMPRESSION: Cholelithiasis and mild gallbladder distention but without gallbladder wall thickening or pericholecystic fluid. Unable to assess the sonographic Murphy sign in the setting of premedication. Findings are unlikely to reflect acute cholecystitis however if there is persisting clinical concern HIDA scan could be obtained. Diffusely increased hepatic attenuation most compatible with hepatic steatosis. Electronically Signed   By: Lovena Le M.D.   On: 08/30/2019 04:21    ____________________________________________   PROCEDURES   Procedure(s) performed (including Critical Care):  Procedures   ____________________________________________   INITIAL IMPRESSION / MDM / [place] / ED COURSE  As part of my medical decision making, I reviewed the following data within the [place] notes reviewed and incorporated, Labs reviewed , Old chart reviewed, Discussed with admitting physician  and Notes from prior ED visits   Differential diagnosis  includes, but is not limited to, biliary colic including the possibility of cholecystitis, acid reflux, gastritis, less likely acute aortic syndrome or ACS.  Nonischemic EKG.  History of gallstones.  Patient appears to be in pain.  I am ordering morphine 4 mg IV, Zofran 4 mg IV, and a right upper quadrant ultrasound to evaluate for the possibility of acute cholecystitis.  Lab work is pending.  Patient understands and agrees with the plan.      Clinical Course as of Aug 29 736  Tue Aug 30, 2019  0525 Patient's ultrasound was equivocal and similar to the prior ultrasound.  However she is having persistent and intractable pain in spite of narcotics and nonnarcotic treatment.  I have ordered another dose of morphine 4 mg IV.Additionally she has very abnormal labs including severe hypokalemia 2.4, hypomagnesemia 1.4, hypocalcemia even when corrected for her hypoalbuminemia (the measured value was 6.0 but corrected is still 7.3).  She is not tolerating oral intake more than just a few sips.  She is willing to try taking potassium 40 mEq by mouth but cannot eat or drink much of anything in order to replete her electrolytes.In addition to the oral potassium and the repeat morphine, I have ordered magnesium 2 g IV, potassium 10 mEq IV x6 doses.   The radiologist interpretation of the ultrasound recommends HIDA scan if clinically relevant, and I think this would be very beneficial to the patient.  There is no indication for emergent surgical consultation during the night as she has no evidence of acute cholecystitis, but I believe she is suffering from persistent biliary colic and I think a HIDA scan would be beneficial.  I will discuss this with the hospitalist as an admission order since the HIDA scan does not occur overnight regardless.   [CF]  [phone] Consult to hospitalist has been placed.   [CF]  [phone] Discussed case with Dr. Sidney Ace of the hospitalist service who will admit.   [CF]    Clinical Course User  Index [CF] Hinda Kehr, MD     ____________________________________________  FINAL CLINICAL IMPRESSION(S) / ED DIAGNOSES  Final diagnoses:  Acute hypokalemia  Hypomagnesemia  Hypocalcemia  Hypoalbuminemia  Intractable pain  Intractable vomiting with nausea  Biliary colic     MEDICATIONS GIVEN DURING THIS VISIT:  Medications  potassium chloride 10 mEq in 100 mL IVPB (10 mEq Intravenous New Bag/Given 08/30/19 0614)  magnesium sulfate IVPB 2 g 50 mL (has no administration in time range)  morphine 4 MG/ML injection 4 mg (has no administration in time range)  calcium gluconate 1 g/ 50 mL sodium chloride IVPB (has no administration in time range)  ondansetron (ZOFRAN) injection 4 mg (has no administration in time range)  0.9 %  sodium chloride infusion (has no administration in time range)  morphine 2 MG/ML injection 2 mg (has no administration in time range)  albuterol (VENTOLIN HFA) 108 (90 Base) MCG/ACT inhaler 2 puff (has no administration in time range)  ondansetron (ZOFRAN) injection 4 mg (4 mg Intravenous Given 08/30/19 0309)  ketorolac (TORADOL) 30 MG/ML injection 15 mg (15 mg Intravenous Given 08/30/19 0309)  morphine 4 MG/ML injection 4 mg (4 mg Intravenous Given 08/30/19 0434)  potassium chloride SA (KLOR-CON) CR tablet 40 mEq (40 mEq Oral Given 08/30/19 0525)  lactated ringers bolus 1,000 mL (1,000 mLs Intravenous New Bag/Given 08/30/19 0524)  calcium gluconate 1 g/ 50 mL sodium chloride IVPB (0 g Intravenous Stopped 08/30/19 [id number])     ED Discharge Orders    None      *Please note:  [name] was evaluated in Emergency Department on 08/30/2019 for the symptoms described in the history of present illness. She was evaluated in the context of the global COVID-19 pandemic, which necessitated consideration that the patient might be at risk for infection with the SARS-CoV-2 virus that causes COVID-19. Institutional protocols and algorithms that pertain to the  evaluation of patients at risk for COVID-19 are in a state of rapid change based on information released by regulatory bodies including the CDC and federal and state organizations. These policies and algorithms were followed during the patient's care in the ED.  Some ED evaluations and interventions may be delayed as a result of limited staffing during the pandemic.*  Note:  This document was prepared using Dragon voice recognition software and may include unintentional dictation errors.   Hinda Kehr, MD 08/30/19 [phone]

## 2019-08-30 NOTE — ED Notes (Signed)
Lab at bedside. Able to collect blood at this time.

## 2019-08-30 NOTE — Telephone Encounter (Signed)
Pt called to let you know she will be having gall bladder surgery today. She went to [facility]

## 2019-08-30 NOTE — ED Notes (Signed)
US at bedside

## 2019-08-30 NOTE — ED Notes (Signed)
Date and time results received: 3:51 AM  Test: Calcium Critical Value: 6.0  Test:  Potassium Critical Value:  2.4  Name of Provider Notified: Karma Greaser MD

## 2019-08-31 ENCOUNTER — Encounter
Admission: EM | Disposition: A | Discharge status: Home / Self Care | Payer: Self-pay | Source: Home / Self Care | Attending: Internal Medicine

## 2019-08-31 ENCOUNTER — Encounter: Payer: Self-pay | Admitting: Internal Medicine

## 2019-08-31 ENCOUNTER — Inpatient Hospital Stay: Payer: 59 | Admitting: Anesthesiology

## 2019-08-31 DIAGNOSIS — Z01818 Encounter for other preprocedural examination: Secondary | ICD-10-CM

## 2019-08-31 DIAGNOSIS — Z6841 Body Mass Index (BMI) 40.0 and over, adult: Secondary | ICD-10-CM

## 2019-08-31 HISTORY — PX: CHOLECYSTECTOMY: SHX55

## 2019-08-31 LAB — BASIC METABOLIC PANEL
Anion gap: 8 (ref 5–15)
BUN: 5 mg/dL — ABNORMAL LOW (ref 6–20)
CO2: 25 mmol/L (ref 22–32)
Calcium: 8.9 mg/dL (ref 8.9–10.3)
Chloride: 108 mmol/L (ref 98–111)
Creatinine, Ser: 0.55 mg/dL (ref 0.44–1.00)
GFR calc Af Amer: >60 mL/min (ref >60)
GFR calc non Af Amer: >60 mL/min (ref >60)
Glucose, Bld: 103 mg/dL — ABNORMAL HIGH (ref 70–99)
Potassium: 4 mmol/L (ref 3.5–5.1)
Sodium: 141 mmol/L (ref 135–145)

## 2019-08-31 LAB — GLUCOSE, CAPILLARY: Glucose-Capillary: 93 mg/dL (ref 70–99)

## 2019-08-31 LAB — CBC
HCT: 37.7 % (ref 36.0–46.0)
Hemoglobin: 12.6 g/dL (ref 12.0–15.0)
MCH: 31.5 pg (ref 26.0–34.0)
MCHC: 33.4 g/dL (ref 30.0–36.0)
MCV: 94.3 fL (ref 80.0–100.0)
Platelets: 242 10*3/uL (ref 150–400)
RBC: 4 MIL/uL (ref 3.87–5.11)
RDW: 12.1 % (ref 11.5–15.5)
WBC: 6.6 10*3/uL (ref 4.0–10.5)
nRBC: 0 % (ref 0.0–0.2)

## 2019-08-31 LAB — MAGNESIUM: Magnesium: 2.3 mg/dL (ref 1.7–2.4)

## 2019-08-31 SURGERY — LAPAROSCOPIC CHOLECYSTECTOMY
Anesthesia: General

## 2019-08-31 MED ORDER — DEXAMETHASONE SODIUM PHOSPHATE 10 MG/ML IJ SOLN
INTRAMUSCULAR | Status: AC
  Filled 2019-08-31: qty 1

## 2019-08-31 MED ORDER — HYDROMORPHONE HCL 1 MG/ML IJ SOLN
0.5000 mg | INTRAMUSCULAR | Status: DC | PRN
  Administered 2019-08-31 – 2019-09-01 (×2): 0.5 mg via INTRAVENOUS
  Filled 2019-08-31 (×2): qty 0.5

## 2019-08-31 MED ORDER — FENTANYL CITRATE (PF) 100 MCG/2ML IJ SOLN
INTRAMUSCULAR | Status: AC
  Administered 2019-08-31: 25 ug via INTRAVENOUS
  Filled 2019-08-31: qty 2

## 2019-08-31 MED ORDER — BUPIVACAINE-EPINEPHRINE (PF) 0.25% -1:200000 IJ SOLN
INTRAMUSCULAR | Status: DC | PRN
  Administered 2019-08-31: 30 mL

## 2019-08-31 MED ORDER — FENTANYL CITRATE (PF) 100 MCG/2ML IJ SOLN
INTRAMUSCULAR | Status: DC | PRN
  Administered 2019-08-31 (×2): 50 ug via INTRAVENOUS

## 2019-08-31 MED ORDER — SUCCINYLCHOLINE CHLORIDE 200 MG/10ML IV SOSY
PREFILLED_SYRINGE | INTRAVENOUS | Status: AC
  Filled 2019-08-31: qty 10

## 2019-08-31 MED ORDER — PROPOFOL 500 MG/50ML IV EMUL
INTRAVENOUS | Status: DC | PRN
  Administered 2019-08-31: 75 ug/kg/min via INTRAVENOUS

## 2019-08-31 MED ORDER — FENTANYL CITRATE (PF) 100 MCG/2ML IJ SOLN
25.0000 ug | INTRAMUSCULAR | Status: DC | PRN
  Administered 2019-08-31: 25 ug via INTRAVENOUS
  Administered 2019-08-31: 50 ug via INTRAVENOUS

## 2019-08-31 MED ORDER — LIDOCAINE HCL (CARDIAC) PF 100 MG/5ML IV SOSY
PREFILLED_SYRINGE | INTRAVENOUS | Status: DC | PRN
  Administered 2019-08-31: 100 mg via INTRAVENOUS

## 2019-08-31 MED ORDER — CIPROFLOXACIN IN D5W 400 MG/200ML IV SOLN
INTRAVENOUS | Status: AC
  Filled 2019-08-31: qty 200

## 2019-08-31 MED ORDER — ROCURONIUM BROMIDE 10 MG/ML (PF) SYRINGE
PREFILLED_SYRINGE | INTRAVENOUS | Status: AC
  Filled 2019-08-31: qty 10

## 2019-08-31 MED ORDER — DEXAMETHASONE SODIUM PHOSPHATE 10 MG/ML IJ SOLN
INTRAMUSCULAR | Status: DC | PRN
  Administered 2019-08-31: 10 mg via INTRAVENOUS

## 2019-08-31 MED ORDER — MIDAZOLAM HCL 2 MG/2ML IJ SOLN
INTRAMUSCULAR | Status: AC
  Filled 2019-08-31: qty 2

## 2019-08-31 MED ORDER — PROMETHAZINE HCL 25 MG/ML IJ SOLN: 6.2500 mg | INTRAMUSCULAR | Status: DC | PRN

## 2019-08-31 MED ORDER — SUGAMMADEX SODIUM 200 MG/2ML IV SOLN
INTRAVENOUS | Status: DC | PRN
  Administered 2019-08-31: 200 mg via INTRAVENOUS

## 2019-08-31 MED ORDER — PROPOFOL 10 MG/ML IV BOLUS
INTRAVENOUS | Status: AC
  Filled 2019-08-31: qty 20

## 2019-08-31 MED ORDER — PROPOFOL 10 MG/ML IV BOLUS
INTRAVENOUS | Status: DC | PRN
  Administered 2019-08-31: 200 mg via INTRAVENOUS
  Administered 2019-08-31: 50 mg via INTRAVENOUS

## 2019-08-31 MED ORDER — ONDANSETRON HCL 4 MG/2ML IJ SOLN
INTRAMUSCULAR | Status: AC
  Filled 2019-08-31: qty 2

## 2019-08-31 MED ORDER — PHENYLEPHRINE HCL (PRESSORS) 10 MG/ML IV SOLN
INTRAVENOUS | Status: DC | PRN
  Administered 2019-08-31: 100 ug via INTRAVENOUS
  Administered 2019-08-31: 200 ug via INTRAVENOUS
  Administered 2019-08-31: 100 ug via INTRAVENOUS
  Administered 2019-08-31: 200 ug via INTRAVENOUS

## 2019-08-31 MED ORDER — ACETAMINOPHEN 10 MG/ML IV SOLN
INTRAVENOUS | Status: AC
  Filled 2019-08-31: qty 100

## 2019-08-31 MED ORDER — OXYCODONE HCL 5 MG PO TABS
5.0000 mg | ORAL_TABLET | ORAL | Status: DC | PRN
  Administered 2019-08-31 – 2019-09-01 (×2): 5 mg via ORAL
  Filled 2019-08-31 (×2): qty 1
  Filled 2019-08-31: qty 2

## 2019-08-31 MED ORDER — FENTANYL CITRATE (PF) 100 MCG/2ML IJ SOLN
INTRAMUSCULAR | Status: AC
  Filled 2019-08-31: qty 2

## 2019-08-31 MED ORDER — ROCURONIUM BROMIDE 100 MG/10ML IV SOLN
INTRAVENOUS | Status: DC | PRN
  Administered 2019-08-31 (×2): 10 mg via INTRAVENOUS
  Administered 2019-08-31: 20 mg via INTRAVENOUS
  Administered 2019-08-31: 10 mg via INTRAVENOUS
  Administered 2019-08-31: 60 mg via INTRAVENOUS

## 2019-08-31 MED ORDER — MIDAZOLAM HCL 2 MG/2ML IJ SOLN
INTRAMUSCULAR | Status: DC | PRN
  Administered 2019-08-31: 2 mg via INTRAVENOUS

## 2019-08-31 MED ORDER — PROPOFOL 500 MG/50ML IV EMUL
INTRAVENOUS | Status: AC
  Filled 2019-08-31: qty 50

## 2019-08-31 MED ORDER — OXYCODONE HCL 5 MG/5ML PO SOLN: 5.0000 mg | Freq: Once | ORAL | Status: DC | PRN

## 2019-08-31 MED ORDER — CIPROFLOXACIN IN D5W 400 MG/200ML IV SOLN
INTRAVENOUS | Status: DC | PRN
  Administered 2019-08-31: 400 mg via INTRAVENOUS

## 2019-08-31 MED ORDER — LIDOCAINE HCL (PF) 2 % IJ SOLN
INTRAMUSCULAR | Status: AC
  Filled 2019-08-31: qty 10

## 2019-08-31 MED ORDER — CIPROFLOXACIN IN D5W 400 MG/200ML IV SOLN
400.0000 mg | Freq: Two times a day (BID) | INTRAVENOUS | Status: DC
  Administered 2019-09-01: 400 mg via INTRAVENOUS
  Filled 2019-08-31 (×4): qty 200

## 2019-08-31 MED ORDER — ACETAMINOPHEN 10 MG/ML IV SOLN
INTRAVENOUS | Status: DC | PRN
  Administered 2019-08-31: 1000 mg via INTRAVENOUS

## 2019-08-31 MED ORDER — OXYCODONE HCL 5 MG PO TABS: 5.0000 mg | ORAL_TABLET | Freq: Once | ORAL | Status: DC | PRN

## 2019-08-31 MED ORDER — ONDANSETRON HCL 4 MG/2ML IJ SOLN
INTRAMUSCULAR | Status: DC | PRN
  Administered 2019-08-31: 4 mg via INTRAVENOUS

## 2019-08-31 MED ORDER — SUCCINYLCHOLINE CHLORIDE 20 MG/ML IJ SOLN
INTRAMUSCULAR | Status: DC | PRN
  Administered 2019-08-31: 200 mg via INTRAVENOUS

## 2019-08-31 SURGICAL SUPPLY — 54 items
ADH SKN CLS APL DERMABOND .7 (GAUZE/BANDAGES/DRESSINGS) ×1
APL PRP STRL LF DISP 70% ISPRP (MISCELLANEOUS) ×1
APPLIER CLIP 5 13 M/L LIGAMAX5 (MISCELLANEOUS) ×2
APR CLP MED LRG 5 ANG JAW (MISCELLANEOUS) ×1
BAG SPEC RTRVL LRG 6X4 10 (ENDOMECHANICALS) ×1
BLADE SURG 15 STRL LF DISP TIS (BLADE) ×1 IMPLANT
BLADE SURG 15 STRL SS (BLADE) ×2
CANISTER SUCT 1200ML W/VALVE (MISCELLANEOUS) ×2 IMPLANT
CATH CHOLANGI 4FR 420404F (CATHETERS) IMPLANT
CHLORAPREP W/TINT 26 (MISCELLANEOUS) ×2 IMPLANT
CLIP APPLIE 5 13 M/L LIGAMAX5 (MISCELLANEOUS) ×1 IMPLANT
CONRAY 60ML FOR OR (MISCELLANEOUS) ×1 IMPLANT
COVER WAND RF STERILE (DRAPES) ×2 IMPLANT
DERMABOND ADVANCED (GAUZE/BANDAGES/DRESSINGS) ×1
DERMABOND ADVANCED .7 DNX12 (GAUZE/BANDAGES/DRESSINGS) ×1 IMPLANT
DRAPE C-ARM XRAY 36X54 (DRAPES) IMPLANT
ELECT CAUTERY BLADE TIP 2.5 (TIP) ×2
ELECT REM PT RETURN 9FT ADLT (ELECTROSURGICAL) ×2
ELECTRODE CAUTERY BLDE TIP 2.5 (TIP) ×1 IMPLANT
ELECTRODE REM PT RTRN 9FT ADLT (ELECTROSURGICAL) ×1 IMPLANT
GLOVE SURG SYN 7.0 (GLOVE) ×2 IMPLANT
GLOVE SURG SYN 7.0 PF PI (GLOVE) ×1 IMPLANT
GLOVE SURG SYN 7.5  E (GLOVE) ×2
GLOVE SURG SYN 7.5 E (GLOVE) ×1 IMPLANT
GLOVE SURG SYN 7.5 PF PI (GLOVE) ×1 IMPLANT
GOWN STRL REUS W/ TWL LRG LVL3 (GOWN DISPOSABLE) ×3 IMPLANT
GOWN STRL REUS W/TWL LRG LVL3 (GOWN DISPOSABLE) ×6
IRRIGATION STRYKERFLOW (MISCELLANEOUS) IMPLANT
IRRIGATOR STRYKERFLOW (MISCELLANEOUS) ×2
IV CATH ANGIO 12GX3 LT BLUE (NEEDLE) ×2 IMPLANT
IV NS 1000ML (IV SOLUTION) ×2
IV NS 1000ML BAXH (IV SOLUTION) IMPLANT
JACKSON PRATT 10 (INSTRUMENTS) IMPLANT
L-HOOK LAP DISP 36CM (ELECTROSURGICAL) ×2
LABEL OR SOLS (LABEL) ×2 IMPLANT
LHOOK LAP DISP 36CM (ELECTROSURGICAL) ×1 IMPLANT
NEEDLE HYPO 22GX1.5 SAFETY (NEEDLE) ×4 IMPLANT
PACK LAP CHOLECYSTECTOMY (MISCELLANEOUS) ×2 IMPLANT
PENCIL ELECTRO HAND CTR (MISCELLANEOUS) ×2 IMPLANT
POUCH SPECIMEN RETRIEVAL 10MM (ENDOMECHANICALS) ×2 IMPLANT
SCISSORS METZENBAUM CVD 33 (INSTRUMENTS) ×2 IMPLANT
SET TUBE SMOKE EVAC HIGH FLOW (TUBING) ×2 IMPLANT
SLEEVE ADV FIXATION 5X100MM (TROCAR) ×6 IMPLANT
SPONGE VERSALON 4X4 4PLY (MISCELLANEOUS) IMPLANT
SUT MNCRL 4-0 (SUTURE) ×2
SUT MNCRL 4-0 27XMFL (SUTURE) ×1
SUT MNCRL AB 4-0 PS2 18 (SUTURE) ×1 IMPLANT
SUT VIC AB 3-0 SH 27 (SUTURE) ×2
SUT VIC AB 3-0 SH 27X BRD (SUTURE) IMPLANT
SUT VICRYL 0 AB UR-6 (SUTURE) ×3 IMPLANT
SUTURE MNCRL 4-0 27XMF (SUTURE) ×1 IMPLANT
SYS KII FIOS ACCESS ABD 5X100 (TROCAR) ×2
SYSTEM KII FIOS ACES ABD 5X100 (TROCAR) ×1 IMPLANT
TROCAR BALLN GELPORT 12X130M (ENDOMECHANICALS) ×2 IMPLANT

## 2019-08-31 NOTE — Progress Notes (Signed)
  Chaplain responded to Order Requisition for Advance Directive information for the patient. On the Unit 2-A, Chaplain learned from [name] that the patient has Isolation precautions and is not available for visiting. Chaplain gave the AD documents to RN Rosaria Ferries, who will give the information to the patient. Additional Chaplain service is available as needed.  [name]

## 2019-08-31 NOTE — Progress Notes (Signed)
[facility] Hospital Day(s): 1.   Post op day(s): Day of Surgery.   Interval History:  Patient seen and examined no acute events or new complaints overnight.  Patient reports she is feeling better. Still with upper abdominal discomfort but overall improved.  Mild nausea but she relates this to indigestion, no emesis No fever, chills Initial electrolyte derangements seen in ED again normalized this morning No leukocytosis, afebrile Plan for laparoscopic cholecystectomy this afternoon   Vital signs in last 24 hours: [min-max] current  Temp:  [98.5 F (36.9 C)] 98.5 F (36.9 C) (03/16 2033) Pulse Rate:  [66-90] 89 (03/16 2033) Resp:  [13-19] 18 (03/16 2033) BP: (112-143)/(75-101) 125/99 (03/16 2033) SpO2:  [92 %-99 %] 98 % (03/16 2033)     Height: 5' 5.5" (166.4 cm) Weight: 117 kg BMI (Calculated): 42.27   Intake/Output last 2 shifts:  03/16 0701 - 03/17 0700 In: 847.6 [I.V.:617.4; IV Piggyback:230.2] Out: 500 [Urine:500]   Physical Exam:  Constitutional: alert, cooperative and no distress  Respiratory: breathing non-labored at rest  Cardiovascular: regular rate and sinus rhythm  Gastrointestinal: Soft,Mild RUQ/Epigastric tenderness (improved compared to yesterday), and non-distended, no rebound/guarding Integumentary: Warm, Dry  Labs:  CBC Latest Ref Rng & Units 08/31/2019 08/30/2019 08/20/2019  WBC 4.0 - 10.5 K/uL 6.6 7.1 8.6  Hemoglobin 12.0 - 15.0 g/dL 12.6 12.1 12.8  Hematocrit 36.0 - 46.0 % 37.7 37.1 38.5  Platelets 150 - 400 K/uL 242 240 218   CMP Latest Ref Rng & Units 08/31/2019 08/30/2019 08/30/2019  Glucose 70 - 99 mg/dL 103(H) 102(H) 101(H)  BUN 6 - 20 mg/dL 5(L) 7 7  Creatinine 0.44 - 1.00 mg/dL 0.55 0.47 <0.30(L)  Sodium 135 - 145 mmol/L 141 139 145  Potassium 3.5 - 5.1 mmol/L 4.0 4.5 2.4(LL)  Chloride 98 - 111 mmol/L 108 105 122(H)  CO2 22 - 32 mmol/L 25 26 20(L)  Calcium 8.9 - 10.3 mg/dL 8.9 9.5 6.0(LL)  Total  Protein 6.5 - 8.1 g/dL - 7.4 4.6(L)  Total Bilirubin 0.3 - 1.2 mg/dL - 0.6 0.4  Alkaline Phos 38 - 126 U/L - 67 40  AST 15 - 41 U/L - 15 11(L)  ALT 0 - 44 U/L - 16 12     Imaging studies: No new pertinent imaging studies   Assessment/Plan: (ICD-10's: K80.20) 54 y.o. female with symptomatic cholelithiasis without cholecystitis.    - Plan for laparoscopic cholecystectomy this afternoon with Dr Hampton Abbot  - All risks, benefits, and alternatives to above procedure(s) were discussed with the patient, all of her questions were answered to her expressed satisfaction, patient expresses she wishes to proceed, and informed consent was obtained.   - NPO + IVF  - IV ABx for surgical prophylaxis on call to OR   - Pain control prn; antiemetics prn  - monitor abdominal examination   - mobilization  - further management per primary care team   All of the above findings and recommendations were discussed with the patient, and the medical team, and all of patient's questions were answered to her expressed satisfaction.  -- Edison Simon, PA-C [facility] 08/31/2019, 7:22 AM [phone] M-F: 7am - 4pm

## 2019-08-31 NOTE — Transfer of Care (Signed)
Immediate Anesthesia Transfer of Care Note  Patient: [name]  Procedure(s) Performed: LAPAROSCOPIC CHOLECYSTECTOMY (N/A )  Patient Location: PACU  Anesthesia Type:General  Level of Consciousness: sedated  Airway & Oxygen Therapy: Patient Spontanous Breathing and Patient connected to face mask oxygen  Post-op Assessment: Report given to RN and Post -op Vital signs reviewed and stable  Post vital signs: Reviewed and stable  Last Vitals:  Vitals Value Taken Time  BP 136/70 08/31/19 1659  Temp 36.1 C 08/31/19 1659  Pulse 102 08/31/19 1709  Resp 20 08/31/19 1709  SpO2 96 % 08/31/19 1709  Vitals shown include unvalidated device data.  Last Pain:  Vitals:   08/31/19 1659  TempSrc:   PainSc: 0-No pain         Complications: No apparent anesthesia complications

## 2019-08-31 NOTE — Progress Notes (Signed)
Initial Nutrition Assessment  DOCUMENTATION CODES:   Morbid obesity  INTERVENTION:   RD will monitor for diet advancement and add supplements if needed  NUTRITION DIAGNOSIS:   Inadequate oral intake related to acute illness as evidenced by NPO status.  GOAL:   Patient will meet greater than or equal to 90% of their needs  MONITOR:   Diet advancement, Labs, Weight trends, Skin, I & O's  REASON FOR ASSESSMENT:   Malnutrition Screening Tool    ASSESSMENT:   54 y.o. female with medical history significant of GERD, depression with anxiety, lumbar degenerative disc disease, cervical radiculopathy, chronic pain syndrome, recently diagnosed gallstone, who presents with abdominal pain and diarrhea.   Pt with decreased appetite and oral intake for 1 week pta r/t nausea and abdominal pain. Pt currently NPO for cholecystectomy today. Per chart, pt down 6.6lbs(3%) over the past 2 weeks. Pt appears fairly weight stable at baseline. RD will monitor for diet advancement and add supplements if needed.    Medications reviewed and include: NaCl @75ml /hr, pepcid, KPhos  Labs reviewed: K 4.0 wnl, BUN 5(L), P 2.3(L), Mg 2.3 wnl  NUTRITION - FOCUSED PHYSICAL EXAM:    Most Recent Value  Orbital Region  No depletion  Upper Arm Region  No depletion  Thoracic and Lumbar Region  No depletion  Buccal Region  No depletion  Temple Region  No depletion  Clavicle Bone Region  No depletion  Clavicle and Acromion Bone Region  No depletion  Scapular Bone Region  No depletion  Dorsal Hand  No depletion  Patellar Region  No depletion  Anterior Thigh Region  No depletion  Posterior Calf Region  No depletion  Edema (RD Assessment)  None  Hair  Reviewed  Eyes  Reviewed  Mouth  Reviewed  Skin  Reviewed  Nails  Reviewed     Diet Order:   Diet Order            Diet NPO time specified Except for: Sips with Meds  Diet effective midnight             EDUCATION NEEDS:   No education needs have  been identified at this time  Skin:  Skin Assessment: Reviewed RN Assessment  Last BM:  3/16  Height:   Ht Readings from Last 1 Encounters:  08/30/19 5' 5.5" (1.664 m)    Weight:   Wt Readings from Last 1 Encounters:  08/30/19 117 kg    Ideal Body Weight:  57.9 kg  BMI:  Body mass index is 42.28 kg/m.  Estimated Nutritional Needs:   Kcal:  2200-2500kcal/day  Protein:  110-125g/day  Fluid:  1.8-2.1L/day  Koleen Distance MS, RD, LDN Contact information available in Amion

## 2019-08-31 NOTE — Anesthesia Preprocedure Evaluation (Signed)
Anesthesia Evaluation  Patient identified by MRN, date of birth, ID band Patient awake    Reviewed: Allergy & Precautions, H&P , NPO status , Patient's Chart, lab work & pertinent test results  History of Anesthesia Complications (+) PONV and history of anesthetic complications  Airway Mallampati: I  TM Distance: >3 FB Neck ROM: full    Dental  (+) Chipped   Pulmonary neg pulmonary ROS, neg shortness of breath,           Cardiovascular Exercise Tolerance: Good (-) angina(-) Past MI and (-) DOE negative cardio ROS       Neuro/Psych  Headaches, PSYCHIATRIC DISORDERS  Neuromuscular disease    GI/Hepatic Neg liver ROS, GERD  Medicated and Controlled,  Endo/Other  Morbid obesity  Renal/GU      Musculoskeletal   Abdominal   Peds  Hematology negative hematology ROS (+)   Anesthesia Other Findings Past Medical History: No date: Allergy No date: DDD (degenerative disc disease), lumbar No date: Depression  Past Surgical History: 2008: ABDOMINAL HYSTERECTOMY     Comment:  LAVH, endometriosis ablation, mccall culdoplasty 2002: DIAGNOSTIC LAPAROSCOPY     Comment:  Hysteroscopy, diagnostic laparoscopy, extensive lysis of              adhesions, endometrial ablation, RSO 2007: RIGHT OOPHORECTOMY     Comment:  with right salpingectomy No date: TUBAL LIGATION  BMI    Body Mass Index: 42.28 kg/m      Reproductive/Obstetrics negative OB ROS                             Anesthesia Physical Anesthesia Plan  ASA: III  Anesthesia Plan: General ETT   Post-op Pain Management:    Induction: Intravenous  PONV Risk Score and Plan: Ondansetron, Dexamethasone, Midazolam and Treatment may vary due to age or medical condition  Airway Management Planned: Oral ETT  Additional Equipment:   Intra-op Plan:   Post-operative Plan: Extubation in OR  Informed Consent: I have reviewed the patients  History and Physical, chart, labs and discussed the procedure including the risks, benefits and alternatives for the proposed anesthesia with the patient or authorized representative who has indicated his/her understanding and acceptance.     Dental Advisory Given  Plan Discussed with: Anesthesiologist, CRNA and Surgeon  Anesthesia Plan Comments: (Patient consented for risks of anesthesia including but not limited to:  - adverse reactions to medications - damage to teeth, lips or other oral mucosa - sore throat or hoarseness - Damage to heart, brain, lungs or loss of life  Patient voiced understanding.)        Anesthesia Quick Evaluation

## 2019-08-31 NOTE — Anesthesia Postprocedure Evaluation (Signed)
Anesthesia Post Note  Patient: [name]  Procedure(s) Performed: LAPAROSCOPIC CHOLECYSTECTOMY (N/A )  Patient location during evaluation: PACU Anesthesia Type: General Level of consciousness: awake and alert Pain management: pain level controlled Vital Signs Assessment: post-procedure vital signs reviewed and stable Respiratory status: spontaneous breathing, nonlabored ventilation, respiratory function stable and patient connected to nasal cannula oxygen Cardiovascular status: blood pressure returned to baseline and stable Postop Assessment: no apparent nausea or vomiting Anesthetic complications: no     Last Vitals:  Vitals:   08/31/19 1729 08/31/19 1745  BP: 122/76 133/76  Pulse: 92 (!) 101  Resp: 15 17  Temp:  (!) 36.1 C  SpO2: 95% 95%    Last Pain:  Vitals:   08/31/19 1745  TempSrc:   PainSc: Asleep                 Arita Miss

## 2019-08-31 NOTE — Anesthesia Procedure Notes (Signed)
Procedure Name: Intubation Date/Time: 08/31/2019 1:51 PM Performed by: Lowry Bowl, CRNA Pre-anesthesia Checklist: Patient identified, Emergency Drugs available, Suction available and Patient being monitored Patient Re-evaluated:Patient Re-evaluated prior to induction Oxygen Delivery Method: Circle system utilized Preoxygenation: Pre-oxygenation with 100% oxygen Induction Type: IV induction, Cricoid Pressure applied and Rapid sequence Ventilation: Mask ventilation without difficulty Laryngoscope Size: Mac and 3 Grade View: Grade II Tube type: Oral Tube size: 7.0 mm Number of attempts: 1 Airway Equipment and Method: Stylet Placement Confirmation: ETT inserted through vocal cords under direct vision,  positive ETCO2 and breath sounds checked- equal and bilateral Secured at: 21 cm Tube secured with: Tape Dental Injury: Teeth and Oropharynx as per pre-operative assessment

## 2019-08-31 NOTE — Op Note (Signed)
  Procedure Date:  08/31/2019  Pre-operative Diagnosis:  Symptomatic cholelithiasis  Post-operative Diagnosis:  Chronic cholecystitis  Procedure:  Laparoscopic cholecystectomy  Surgeon:  Melvyn Neth, MD  Assistant:  Burke Keels, PA-S  Anesthesia:  General endotracheal  Estimated Blood Loss:  50 ml  Specimens:  gallbladder  Complications:  None  Indications for Procedure:  This is a 55 y.o. female who presents with abdominal pain and workup revealing symptomatic cholelithiasis.  The benefits, complications, treatment options, and expected outcomes were discussed with the patient. The risks of bleeding, infection, recurrence of symptoms, failure to resolve symptoms, bile duct damage, bile duct leak, retained common bile duct stone, bowel injury, and need for further procedures were all discussed with the patient and she was willing to proceed.  Description of Procedure: The patient was correctly identified in the preoperative area and brought into the operating room.  The patient was placed supine with VTE prophylaxis in place.  Appropriate time-outs were performed.  Anesthesia was induced and the patient was intubated.  Appropriate antibiotics were infused.  The abdomen was prepped and draped in a sterile fashion. An infraumbilical incision was made. A cutdown technique was used to enter the abdominal cavity without injury, and a Hasson trocar was inserted.  Pneumoperitoneum was obtained with appropriate opening pressures.  A 5-mm port was placed in the subxiphoid area and two 5-mm ports were placed in the right upper quadrant under direct visualization.  There were significant adhesions from the omentum to the liver edge.  These were taken down with cautery, allowing visualization of the gallbladder.  It was distended and inflamed. The fundus was grasped and retracted cephalad.  Adhesions were lysed bluntly and with electrocautery. The infundibulum was grasped and retracted  laterally, exposing the peritoneum overlying the gallbladder.  This was incised with electrocautery and extended on either side of the gallbladder.  The cystic duct was very adhered to the gallbladder neck and careful dissection was done to free up the portion closest to the gallbladder.   Once satisfied that this was the cystic duct, it was clipped twice proximally and once distally, cutting in between.  The cystic artery was never identified, but there was also no significant bleeding from that area to represent a cystic artery.  The gallbladder was taken from the gallbladder fossa in a retrograde fashion with electrocautery. The gallbladder was placed in an Endocatch bag. The liver bed was inspected and any bleeding was controlled with electrocautery. The right upper quadrant was then inspected again revealing intact clips, no bleeding, and no ductal injury.  The area was thoroughly irrigated.  The 5 mm ports were removed under direct visualization and the Hasson trocar was removed.  The Endocatch bag was brought out via the umbilical incision. The fascial opening was closed using 0 vicryl sutures.  Local anesthetic was infused in all incisions and the incisions were closed with 4-0 Monocryl.  The wounds were cleaned and sealed with DermaBond.  The patient was emerged from anesthesia and extubated and brought to the recovery room for further management.  The patient tolerated the procedure well and all counts were correct at the end of the case.   Melvyn Neth, MD

## 2019-08-31 NOTE — Progress Notes (Signed)
Patient ID: [name], female   DOB: 12/26/1965, 54 y.o.   MRN: [id number] [facility] PROGRESS NOTE  Azza Kitchin [id number] DOB: 1966/03/16 DOA: 08/30/2019 PCP: Jearld Fenton, NP  HPI/Subjective: Patient feeling better today.  Nervous about surgery.  Asked if she could shower.  Currently no abdominal pain.  Objective: Vitals:   08/30/19 1825 08/30/19 2033  BP: 129/79 (!) 125/99  Pulse: 79 89  Resp:  18  Temp: 98.5 F (36.9 C) 98.5 F (36.9 C)  SpO2: 92% 98%    Intake/Output Summary (Last 24 hours) at 08/31/2019 1209 Last data filed at 08/31/2019 1100 Gross per 24 hour  Intake 2607.67 ml  Output 500 ml  Net 2107.67 ml   Filed Weights   08/30/19 0240  Weight: 117 kg    ROS: Review of Systems  Constitutional: Negative for fever.  Respiratory: Negative for shortness of breath.   Cardiovascular: Negative for chest pain.  Gastrointestinal: Positive for constipation. Negative for abdominal pain, diarrhea, nausea and vomiting.  Genitourinary: Negative for dysuria.  Musculoskeletal: Negative for joint pain.  Neurological: Negative for headaches.   Exam: Physical Exam  Constitutional: She is oriented to person, place, and time.  Eyes: Conjunctivae and lids are normal.  Cardiovascular: S1 normal and S2 normal. Exam reveals no gallop.  No murmur heard. Respiratory: No respiratory distress. She has no wheezes. She has no rhonchi. She has no rales.  GI: Soft. There is no abdominal tenderness.  Musculoskeletal:     Right ankle: No swelling.     Left ankle: No swelling.  Lymphadenopathy:    She has no cervical adenopathy.  Neurological: She is alert and oriented to person, place, and time. No cranial nerve deficit.  Skin: Skin is warm. No rash noted. Nails show no clubbing.  Psychiatric: She has a normal mood and affect.      Data Reviewed: Basic Metabolic Panel: Recent Labs  Lab 08/30/19 0315 08/30/19 1102 08/31/19 0504  NA 145 139 141   K 2.4* 4.5 4.0  CL 122* 105 108  CO2 20* 26 25  GLUCOSE 101* 102* 103*  BUN 7 7 5*  CREATININE <0.30* 0.47 0.55  CALCIUM 6.0* 9.5 8.9  MG 1.4*  --  2.3  PHOS 2.3*  --   --    Liver Function Tests: Recent Labs  Lab 08/30/19 0315 08/30/19 1102  AST 11* 15  ALT 12 16  ALKPHOS 40 67  BILITOT 0.4 0.6  PROT 4.6* 7.4  ALBUMIN 2.4* 3.7   Recent Labs  Lab 08/30/19 0315  LIPASE 35   CBC: Recent Labs  Lab 08/30/19 0315 08/31/19 0504  WBC 7.1 6.6  NEUTROABS 3.6  --   HGB 12.1 12.6  HCT 37.1 37.7  MCV 95.9 94.3  PLT 240 242    CBG: Recent Labs  Lab 08/31/19 0754  GLUCAP 93    Recent Results (from the past 240 hour(s))  SARS CORONAVIRUS 2 (TAT 6-24 HRS) Nasopharyngeal Nasopharyngeal Swab     Status: None   Collection Time: 08/30/19  7:45 AM   Specimen: Nasopharyngeal Swab  Result Value Ref Range Status   SARS Coronavirus 2 NEGATIVE NEGATIVE Final    Comment: (NOTE) SARS-CoV-2 target nucleic acids are NOT DETECTED. The SARS-CoV-2 RNA is generally detectable in upper and lower respiratory specimens during the acute phase of infection. Negative results do not preclude SARS-CoV-2 infection, do not rule out co-infections with other pathogens, and should not be used as the sole basis for  treatment or other patient management decisions. Negative results must be combined with clinical observations, patient history, and epidemiological information. The expected result is Negative. Fact Sheet for Patients: SugarRoll.be Fact Sheet for Healthcare Providers: https://www.woods-mathews.com/ This test is not yet approved or cleared by the [place] FDA and  has been authorized for detection and/or diagnosis of SARS-CoV-2 by FDA under an Emergency Use Authorization (EUA). This EUA will remain  in effect (meaning this test can be used) for the duration of the COVID-19 declaration under Section 56 4(b)(1) of the Act, 21  U.S.C. section 360bbb-3(b)(1), unless the authorization is terminated or revoked sooner. Performed at [facility], [place] [address], [place], [place] [address]      Studies: US ABDOMEN LIMITED RUQ  Result Date: 08/30/2019 CLINICAL DATA:  Severe right upper quadrant pain after eating, no cholelithiasis EXAM: ULTRASOUND ABDOMEN LIMITED RIGHT UPPER QUADRANT COMPARISON:  Ultrasound 08/20/2019, CT 08/20/2019 FINDINGS: Gallbladder: Multiple layering posteriorly shadowing gallstones are seen within the gallbladder. Gallbladder wall thickness is at the upper limits of normal. Hypoattenuation along the gallbladder fossa appears to reflect fatty sparing of the adjacent liver parenchyma rather than edematous change. Unable to assess sonographic Murphy sign as patient was medicated prior to the examination. Common bile duct: Diameter: 2.6 mm, nondilated Liver: Diffusely increased hepatic echogenicity with loss of definition of the portal triads and diminished posterior through transmission compatible with hepatic steatosis. Hypoattenuation along the gallbladder fossa compatible fatty sparing. No focal liver lesion. Portal vein is patent on color Doppler imaging with normal direction of blood flow towards the liver. Other: None. IMPRESSION: Cholelithiasis and mild gallbladder distention but without gallbladder wall thickening or pericholecystic fluid. Unable to assess the sonographic Murphy sign in the setting of premedication. Findings are unlikely to reflect acute cholecystitis however if there is persisting clinical concern HIDA scan could be obtained. Diffusely increased hepatic attenuation most compatible with hepatic steatosis. Electronically Signed   By: Lovena Le M.D.   On: 08/30/2019 04:21    Scheduled Meds: Continuous Infusions: . sodium chloride 75 mL/hr at 08/31/19 1100  . famotidine (PEPCID) IV Stopped (08/31/19 [id number])  . potassium PHOSPHATE IVPB (in mmol)       Assessment/Plan:  1. Preoperative consultation for symptomatic cholelithiasis and biliary colic. No contraindications to surgery at this time.  Hopefully if laparoscopic cholecystectomy goes well today may be able to go home tomorrow. 2. Hypomagnesemia, hypokalemia and hypophosphatemia.  All of these electrolytes have been replaced and now in the normal range. 3. GERD on Pepcid 4. Obesity stage III with a BMI of 42.28 with hepatic steatosis  Code Status:     Code Status Orders  (From admission, onward)         Start     Ordered   08/30/19 0915  Full code  Continuous     08/30/19 0914        Code Status History    This patient has a current code status but no historical code status.   Advance Care Planning Activity     Family Communication: As per general surgery since the patient is having an operation today Disposition Plan: If everything goes well today with laparoscopic cholecystectomy there is a potential for the patient to be discharged home tomorrow.  Consultants:  General surgery  Time spent: 28 minutes  [place]

## 2019-09-01 ENCOUNTER — Telehealth: Payer: Self-pay

## 2019-09-01 DIAGNOSIS — K811 Chronic cholecystitis: Secondary | ICD-10-CM

## 2019-09-01 LAB — GLUCOSE, CAPILLARY: Glucose-Capillary: 121 mg/dL — ABNORMAL HIGH (ref 70–99)

## 2019-09-01 MED ORDER — FAMOTIDINE 20 MG PO TABS: 20.0000 mg | ORAL_TABLET | Freq: Every day | ORAL | 0 refills | Status: DC

## 2019-09-01 MED ORDER — OXYCODONE HCL 5 MG PO TABS: 5.0000 mg | ORAL_TABLET | Freq: Four times a day (QID) | ORAL | 0 refills | Status: DC | PRN

## 2019-09-01 MED ORDER — CIPROFLOXACIN HCL 500 MG PO TABS: 500.0000 mg | ORAL_TABLET | Freq: Two times a day (BID) | ORAL | 0 refills | Status: AC

## 2019-09-01 NOTE — Telephone Encounter (Signed)
Will discuss at upcoming appt.

## 2019-09-01 NOTE — Discharge Instructions (Signed)
In addition to included general post-operative instructions for laparoscopic cholecystectomy,  Diet: Resume home diet. If you eat fatty or greasy meals in the next few days following surgery you may notice loose stools as the body adjusts to not having a gallbladder. This typically resolves in a few days.   Activity: No heavy lifting >20 pounds (children, pets, laundry, garbage) for 4 weeks, but light activity and walking are encouraged. Do not drive or drink alcohol if taking narcotic pain medications or having pain that might distract from driving.  Wound care: 2 days after surgery (03/19), you may shower/get incision wet with soapy water and pat dry (do not rub incisions), but no baths or submerging incision underwater until follow-up.   Medications: Resume all home medications. For mild to moderate pain: acetaminophen (Tylenol) or ibuprofen/naproxen (if no kidney disease). Combining Tylenol with alcohol can substantially increase your risk of causing liver disease. Narcotic pain medications, if prescribed, can be used for severe pain, though may cause nausea, constipation, and drowsiness. Do not combine Tylenol and Percocet (or similar) within a 6 hour period as Percocet (and similar) contain(s) Tylenol. If you do not need the narcotic pain medication, you do not need to fill the prescription.  Call office [phone] / [phone]) at any time if any questions, worsening pain, fevers/chills, bleeding, drainage from incision site, or other concerns.

## 2019-09-01 NOTE — Telephone Encounter (Signed)
Left message on voicemail to return my call- need to complete TCM.

## 2019-09-01 NOTE — Discharge Summary (Signed)
[facility] NAME: [name]    MR#:  [id number]  DATE OF BIRTH:  01/28/1966  DATE OF ADMISSION:  08/30/2019 ADMITTING PHYSICIAN: Ivor Costa, MD  DATE OF DISCHARGE: 09/01/2019  PRIMARY CARE PHYSICIAN: Jearld Fenton, NP    ADMISSION DIAGNOSIS:  Hypocalcemia 0000000 Biliary colic 99991111 Acute hypokalemia [E87.6] Hypomagnesemia [E83.42] Hypoalbuminemia [E88.09] Intractable pain [R52] Cholelithiasis [K80.20] Intractable vomiting with nausea [R11.2]  DISCHARGE DIAGNOSIS:  Principal Problem:   Cholelithiasis Active Problems:   DDD (degenerative disc disease), lumbar   Gastroesophageal reflux disease without esophagitis   Class 3 severe obesity without serious comorbidity with body mass index (BMI) of 40.0 to 44.9 in adult (HCC)   Hypokalemia   Hypocalcemia   Hypomagnesemia   Diarrhea   Pre-op evaluation   Hypophosphatemia   SECONDARY DIAGNOSIS:   Past Medical History:  Diagnosis Date  . Allergy   . DDD (degenerative disc disease), lumbar   . Depression     HOSPITAL COURSE:   1.  Chronic cholecystitis.  Patient had biliary colic and symptomatic cholelithiasis.  Patient had laparoscopic cholecystectomy by Dr. Hampton Abbot on 08/31/2019.  Patient was feeling soreness in her abdomen but was stable for discharge as per the surgeons on 09/01/2019.  Surgeons wrote prescription for pain medication and antibiotic.  Surgeons also wrote a work note for the patient. 2.  Hypomagnesemia, hypokalemia and hypophosphatemia. All these electrolytes have been replaced and now in the normal range. 3.  GERD.  I prescribed Pepcid 4.  Obesity stage III with a BMI of 42.28.  Hepatic steatosis seen on imaging test.  DISCHARGE CONDITIONS:   Satisfactory  CONSULTS OBTAINED:  Treatment Team:  Olean Ree, MD  DRUG ALLERGIES:   Allergies  Allergen Reactions  . Orange Concentrate [Flavoring Agent] Rash    Rash on right side of body   . Gabapentin Other (See Comments)    Hair loss  . Penicillins Other (See Comments)    Childhood allergy Has patient had a PCN reaction causing immediate rash, facial/tongue/throat swelling, SOB or lightheadedness with hypotension: Unknown Has patient had a PCN reaction causing severe rash involving mucus membranes or skin necrosis: Unknown Has patient had a PCN reaction that required hospitalization: No  Has patient had a PCN reaction occurring within the last 10 years: No  If all of the above answers are "NO", then may proceed with Cephalosporin use.     DISCHARGE MEDICATIONS:   Allergies as of 09/01/2019      Reactions   Orange Concentrate [flavoring Agent] Rash   Rash on right side of body   Gabapentin Other (See Comments)   Hair loss   Penicillins Other (See Comments)   Childhood allergy Has patient had a PCN reaction causing immediate rash, facial/tongue/throat swelling, SOB or lightheadedness with hypotension: Unknown Has patient had a PCN reaction causing severe rash involving mucus membranes or skin necrosis: Unknown Has patient had a PCN reaction that required hospitalization: No  Has patient had a PCN reaction occurring within the last 10 years: No  If all of the above answers are "NO", then may proceed with Cephalosporin use.      Medication List    STOP taking these medications   albuterol 108 (90 Base) MCG/ACT inhaler Commonly known as: VENTOLIN HFA   Duexis 800-26.6 MG Tabs Generic drug: Ibuprofen-Famotidine   furosemide 20 MG tablet Commonly known as: LASIX   meloxicam 15 MG tablet Commonly known as: MOBIC   methocarbamol  500 MG tablet Commonly known as: ROBAXIN   traMADol 50 MG tablet Commonly known as: Ultram     TAKE these medications   ciprofloxacin 500 MG tablet Commonly known as: Cipro Take 1 tablet (500 mg total) by mouth 2 (two) times daily for 7 days.   diclofenac sodium 1 % Gel Commonly known as: VOLTAREN Apply 2 g topically 4 (four)  times daily. What changed:   when to take this  reasons to take this   famotidine 20 MG tablet Commonly known as: PEPCID Take 1 tablet (20 mg total) by mouth at bedtime.   fexofenadine-pseudoephedrine 180-240 MG 24 hr tablet Commonly known as: ALLEGRA-D 24 TAKE 1 TABLET BY MOUTH EVERY DAY AS NEEDED   fluticasone 50 MCG/ACT nasal spray Commonly known as: FLONASE Place 2 sprays into both nostrils daily. What changed:   when to take this  reasons to take this   hydrOXYzine 10 MG tablet Commonly known as: ATARAX/VISTARIL Take 1 tablet (10 mg total) by mouth daily as needed.   ondansetron 4 MG disintegrating tablet Commonly known as: Zofran ODT Take 1 tablet (4 mg total) by mouth every 8 (eight) hours as needed.   oxyCODONE 5 MG immediate release tablet Commonly known as: Oxy IR/ROXICODONE Take 1 tablet (5 mg total) by mouth every 6 (six) hours as needed for severe pain or breakthrough pain.        DISCHARGE INSTRUCTIONS:   Follow-up PMD 5 days Follow-up general surgery 1 week  If you experience worsening of your admission symptoms, develop shortness of breath, life threatening emergency, suicidal or homicidal thoughts you must seek medical attention immediately by calling 911 or calling your MD immediately  if symptoms less severe.  You Must read complete instructions/literature along with all the possible adverse reactions/side effects for all the Medicines you take and that have been prescribed to you. Take any new Medicines after you have completely understood and accept all the possible adverse reactions/side effects.   Please note  You were cared for by a hospitalist during your hospital stay. If you have any questions about your discharge medications or the care you received while you were in the hospital after you are discharged, you can call the unit and asked to speak with the hospitalist on call if the hospitalist that took care of you is not available. Once  you are discharged, your primary care physician will handle any further medical issues. Please note that NO REFILLS for any discharge medications will be authorized once you are discharged, as it is imperative that you return to your primary care physician (or establish a relationship with a primary care physician if you do not have one) for your aftercare needs so that they can reassess your need for medications and monitor your lab values.    Today   CHIEF COMPLAINT:   Chief Complaint  Patient presents with  . Abdominal Pain    HISTORY OF PRESENT ILLNESS:  [name]  is a 54 y.o. female came in with abdominal pain   VITAL SIGNS:  Blood pressure 134/83, pulse 90, temperature 98.7 F (37.1 C), temperature source Oral, resp. rate 18, height 5' 5.5" (1.664 m), weight 117 kg, SpO2 95 %.  I/O:    Intake/Output Summary (Last 24 hours) at 09/01/2019 1206 Last data filed at 09/01/2019 1044 Gross per 24 hour  Intake 1883.79 ml  Output 50 ml  Net 1833.79 ml    PHYSICAL EXAMINATION:  GENERAL:  54 y.o.-year-old patient lying in  the bed with no acute distress.  HEENT: Head atraumatic, normocephalic.  LUNGS: Normal breath sounds bilaterally, no wheezing, rales,rhonchi or crepitation. No use of accessory muscles of respiration.  CARDIOVASCULAR: S1, S2 normal. No murmurs, rubs, or gallops.  ABDOMEN: Soft, tender.  EXTREMITIES: No pedal edema.  PSYCHIATRIC: The patient is alert and oriented x 3.  SKIN: No obvious rash, lesion, or ulcer.   DATA REVIEW:   CBC Recent Labs  Lab 08/31/19 0504  WBC 6.6  HGB 12.6  HCT 37.7  PLT 242    Chemistries  Recent Labs  Lab 08/30/19 1102 08/30/19 1102 08/31/19 0504  NA 139   < > 141  K 4.5   < > 4.0  CL 105   < > 108  CO2 26   < > 25  GLUCOSE 102*   < > 103*  BUN 7   < > 5*  CREATININE 0.47   < > 0.55  CALCIUM 9.5   < > 8.9  MG  --   --  2.3  AST 15  --   --   ALT 16  --   --   ALKPHOS 67  --   --   BILITOT 0.6  --   --     < > = values in this interval not displayed.     Microbiology Results  Results for orders placed or performed during the hospital encounter of 08/30/19  SARS CORONAVIRUS 2 (TAT 6-24 HRS) Nasopharyngeal Nasopharyngeal Swab     Status: None   Collection Time: 08/30/19  7:45 AM   Specimen: Nasopharyngeal Swab  Result Value Ref Range Status   SARS Coronavirus 2 NEGATIVE NEGATIVE Final    Comment: (NOTE) SARS-CoV-2 target nucleic acids are NOT DETECTED. The SARS-CoV-2 RNA is generally detectable in upper and lower respiratory specimens during the acute phase of infection. Negative results do not preclude SARS-CoV-2 infection, do not rule out co-infections with other pathogens, and should not be used as the sole basis for treatment or other patient management decisions. Negative results must be combined with clinical observations, patient history, and epidemiological information. The expected result is Negative. Fact Sheet for Patients: SugarRoll.be Fact Sheet for Healthcare Providers: https://www.woods-mathews.com/ This test is not yet approved or cleared by the [place] FDA and  has been authorized for detection and/or diagnosis of SARS-CoV-2 by FDA under an Emergency Use Authorization (EUA). This EUA will remain  in effect (meaning this test can be used) for the duration of the COVID-19 declaration under Section 56 4(b)(1) of the Act, 21 U.S.C. section 360bbb-3(b)(1), unless the authorization is terminated or revoked sooner. Performed at [facility], [place] [address], [place], [place] [address]       Management plans discussed with the patient, and she is in agreement.  Patient deferred me calling family at this time.  CODE STATUS:     Code Status Orders  (From admission, onward)         Start     Ordered   08/30/19 0915  Full code  Continuous     08/30/19 0914        Code Status History    This patient has a  current code status but no historical code status.   Advance Care Planning Activity      TOTAL TIME TAKING CARE OF THIS PATIENT: 32 minutes.    Loletha Grayer M.D on 09/01/2019 at 12:06 PM  Between 7am to 6pm - Pager - [phone]  After  6pm go to [email or web address] - password [email or web address]  [facility]  CC: Primary care physician; Jearld Fenton, NP

## 2019-09-01 NOTE — Telephone Encounter (Signed)
Transition Care Management Follow-up Telephone Call  Date of discharge and from where: 09/01/2019, [facility]   How have you been since you were released from the hospital? Patient states that she is doing much better, just sore.   Any questions or concerns? No   Items Reviewed:  Did the pt receive and understand the discharge instructions provided? Yes   Medications obtained and verified? Yes   Any new allergies since your discharge? No   Dietary orders reviewed? Yes  Do you have support at home? Yes   Functional Questionnaire: (I = Independent and D = Dependent) ADLs: I  Bathing/Dressing- I  Meal Prep- I  Eating- I  Maintaining continence- I  Transferring/Ambulation- I  Managing Meds- I  Follow up appointments reviewed:   PCP Hospital f/u appt confirmed? Yes  Scheduled to see Webb Silversmith, NP on 09/06/2019 @ 12:15 pm.  [facility] f/u appt confirmed? Yes  Scheduled to see Dr. Olean Ree on 09/14/2019 @ 10:30 am.  Are transportation arrangements needed? No   If their condition worsens, is the pt aware to call PCP or go to the Emergency Dept.? Yes  Was the patient provided with contact information for the PCP's office or ED? Yes  Was to pt encouraged to call back with questions or concerns? Yes

## 2019-09-01 NOTE — Progress Notes (Signed)
[facility] Hospital Day(s): 2.   Post op day(s): 1 Day Post-Op.   Interval History:  Patient seen and examined No acute events or new complaints overnight.  Patient reports she is doing better. Previous abdominal pain has resolved now complaining of abdominal soreness No fever, chills, nausea, or emesis No new labs Tolerating diet and advancing No new issues   Vital signs in last 24 hours: [min-max] current  Temp:  [97 F (36.1 C)-98.7 F (37.1 C)] 98.7 F (37.1 C) (03/18 0644) Pulse Rate:  [90-108] 90 (03/18 0644) Resp:  [15-20] 18 (03/17 2029) BP: (122-139)/(70-96) 134/83 (03/18 0644) SpO2:  [91 %-100 %] 95 % (03/18 0644)     Height: 5' 5.5" (166.4 cm) Weight: 117 kg BMI (Calculated): 42.27   Intake/Output last 2 shifts:  03/17 0701 - 03/18 0700 In: 3474.5 [I.V.:3074.5; IV Piggyback:400] Out: 50 [Blood:50]   Physical Exam:  Constitutional: alert, cooperative and no distress  Respiratory: breathing non-labored at rest  Cardiovascular: regular rate and sinus rhythm  Gastrointestinal: soft, incisional soreness, and non-distended, no rebound/guarding Integumentary: Laparoscopic incisions are CDI with dermabond, no erythema, no drainage   Labs:  CBC Latest Ref Rng & Units 08/31/2019 08/30/2019 08/20/2019  WBC 4.0 - 10.5 K/uL 6.6 7.1 8.6  Hemoglobin 12.0 - 15.0 g/dL 12.6 12.1 12.8  Hematocrit 36.0 - 46.0 % 37.7 37.1 38.5  Platelets 150 - 400 K/uL 242 240 218   CMP Latest Ref Rng & Units 08/31/2019 08/30/2019 08/30/2019  Glucose 70 - 99 mg/dL 103(H) 102(H) 101(H)  BUN 6 - 20 mg/dL 5(L) 7 7  Creatinine 0.44 - 1.00 mg/dL 0.55 0.47 <0.30(L)  Sodium 135 - 145 mmol/L 141 139 145  Potassium 3.5 - 5.1 mmol/L 4.0 4.5 2.4(LL)  Chloride 98 - 111 mmol/L 108 105 122(H)  CO2 22 - 32 mmol/L 25 26 20(L)  Calcium 8.9 - 10.3 mg/dL 8.9 9.5 6.0(LL)  Total Protein 6.5 - 8.1 g/dL - 7.4 4.6(L)  Total Bilirubin 0.3 - 1.2 mg/dL - 0.6 0.4  Alkaline Phos  38 - 126 U/L - 67 40  AST 15 - 41 U/L - 15 11(L)  ALT 0 - 44 U/L - 16 12    Imaging studies: No new pertinent imaging studies   Assessment/Plan:  54 y.o. female overall doing well 1 Day Post-Op s/p laparoscopic cholecystectomy for symptomatic cholelithiasis and chronic cholecystitis   - ADAT   - Pain control prn; antiemetics prn   - monitor abdominal examination   - mobilize   - Discharge Planning: If tolerates diet advancement today, OK to discharge from surgical standpoint, I will do discharge instructions from surgical standpoint.     All of the above findings and recommendations were discussed with the patient, and the medical team, and all of patient's questions were answered to her expressed satisfaction.  -- Edison Simon, PA-C [facility] 09/01/2019, 7:30 AM [phone] M-F: 7am - 4pm

## 2019-09-01 NOTE — Progress Notes (Signed)
Donnella Bi to be D/C'd Home per MD order.  Discussed prescriptions and follow up appointments with the patient. Prescriptions given to patient, medication list explained in detail. Pt verbalized understanding.  Allergies as of 09/01/2019      Reactions   Orange Concentrate [flavoring Agent] Rash   Rash on right side of body   Gabapentin Other (See Comments)   Hair loss   Penicillins Other (See Comments)   Childhood allergy Has patient had a PCN reaction causing immediate rash, facial/tongue/throat swelling, SOB or lightheadedness with hypotension: Unknown Has patient had a PCN reaction causing severe rash involving mucus membranes or skin necrosis: Unknown Has patient had a PCN reaction that required hospitalization: No  Has patient had a PCN reaction occurring within the last 10 years: No  If all of the above answers are "NO", then may proceed with Cephalosporin use.      Medication List    STOP taking these medications   albuterol 108 (90 Base) MCG/ACT inhaler Commonly known as: VENTOLIN HFA   Duexis 800-26.6 MG Tabs Generic drug: Ibuprofen-Famotidine   furosemide 20 MG tablet Commonly known as: LASIX   meloxicam 15 MG tablet Commonly known as: MOBIC   methocarbamol 500 MG tablet Commonly known as: ROBAXIN   traMADol 50 MG tablet Commonly known as: Ultram     TAKE these medications   ciprofloxacin 500 MG tablet Commonly known as: Cipro Take 1 tablet (500 mg total) by mouth 2 (two) times daily for 7 days.   diclofenac sodium 1 % Gel Commonly known as: VOLTAREN Apply 2 g topically 4 (four) times daily. What changed:   when to take this  reasons to take this   famotidine 20 MG tablet Commonly known as: PEPCID Take 1 tablet (20 mg total) by mouth at bedtime.   fexofenadine-pseudoephedrine 180-240 MG 24 hr tablet Commonly known as: ALLEGRA-D 24 TAKE 1 TABLET BY MOUTH EVERY DAY AS NEEDED   fluticasone 50 MCG/ACT nasal spray Commonly known as:  FLONASE Place 2 sprays into both nostrils daily. What changed:   when to take this  reasons to take this   hydrOXYzine 10 MG tablet Commonly known as: ATARAX/VISTARIL Take 1 tablet (10 mg total) by mouth daily as needed.   ondansetron 4 MG disintegrating tablet Commonly known as: Zofran ODT Take 1 tablet (4 mg total) by mouth every 8 (eight) hours as needed.   oxyCODONE 5 MG immediate release tablet Commonly known as: Oxy IR/ROXICODONE Take 1 tablet (5 mg total) by mouth every 6 (six) hours as needed for severe pain or breakthrough pain.       Vitals:   08/31/19 2029 09/01/19 0644  BP: 139/83 134/83  Pulse: (!) 103 90  Resp: 18   Temp: 98 F (36.7 C) 98.7 F (37.1 C)  SpO2: 96% 95%    Skin clean, dry and intact without evidence of skin break down, no evidence of skin tears noted. IV catheter discontinued intact. Site without signs and symptoms of complications. Dressing and pressure applied. Pt denies pain at this time. No complaints noted.  An After Visit Summary was printed and given to the patient. Patient escorted via [place], and D/C home via private auto.  Fuller Mandril, RN

## 2019-09-02 ENCOUNTER — Telehealth: Payer: Self-pay | Admitting: Internal Medicine

## 2019-09-02 LAB — SURGICAL PATHOLOGY

## 2019-09-02 NOTE — Telephone Encounter (Signed)
FMLA PAPERWORK IN [name]'S IN BOX For review and signature Start date 08/30/19 she has appointment on 3/23 to discuss going back work

## 2019-09-02 NOTE — Telephone Encounter (Signed)
Left message asking pt to call office regarding fmla  See robin 

## 2019-09-02 NOTE — Telephone Encounter (Signed)
Will discuss at upcoming appt.

## 2019-09-06 ENCOUNTER — Encounter: Payer: Self-pay | Admitting: Internal Medicine

## 2019-09-06 ENCOUNTER — Ambulatory Visit: Payer: 59 | Admitting: Internal Medicine

## 2019-09-06 ENCOUNTER — Other Ambulatory Visit: Payer: Self-pay

## 2019-09-06 VITALS — BP 124/78 | HR 110 | Temp 97.8°F | Wt 251.0 lb

## 2019-09-06 DIAGNOSIS — Z9049 Acquired absence of other specified parts of digestive tract: Secondary | ICD-10-CM | POA: Diagnosis not present

## 2019-09-06 DIAGNOSIS — K8020 Calculus of gallbladder without cholecystitis without obstruction: Secondary | ICD-10-CM

## 2019-09-06 LAB — COMPREHENSIVE METABOLIC PANEL
ALT: 19 U/L (ref 0–35)
AST: 14 U/L (ref 0–37)
Albumin: 4.2 g/dL (ref 3.5–5.2)
Alkaline Phosphatase: 74 U/L (ref 39–117)
BUN: 11 mg/dL (ref 6–23)
CO2: 24 mEq/L (ref 19–32)
Calcium: 9.9 mg/dL (ref 8.4–10.5)
Chloride: 105 mEq/L (ref 96–112)
Creatinine, Ser: 0.78 mg/dL (ref 0.40–1.20)
GFR: 93.31 mL/min (ref >60.00)
Glucose, Bld: 128 mg/dL — ABNORMAL HIGH (ref 70–99)
Potassium: 3.7 mEq/L (ref 3.5–5.1)
Sodium: 137 mEq/L (ref 135–145)
Total Bilirubin: 0.5 mg/dL (ref 0.2–1.2)
Total Protein: 8.1 g/dL (ref 6.0–8.3)

## 2019-09-06 LAB — CBC
HCT: 38.4 % (ref 36.0–46.0)
Hemoglobin: 13.2 g/dL (ref 12.0–15.0)
MCHC: 34.4 g/dL (ref 30.0–36.0)
MCV: 94.4 fl (ref 78.0–100.0)
Platelets: 286 10*3/uL (ref 150.0–400.0)
RBC: 4.07 Mil/uL (ref 3.87–5.11)
RDW: 13.1 % (ref 11.5–15.5)
WBC: 7.2 10*3/uL (ref 4.0–10.5)

## 2019-09-06 NOTE — Progress Notes (Signed)
Subjective:    Patient ID: [name], female    DOB: 14-May-1966, 54 y.o.   MRN: [id number]  HPI  Patient presents to the clinic today for hospital follow-up.  She went to the ER/16 with complaint of right upper quadrant pain, nausea and vomiting.  CT abdomen was concerning for acute cholecystitis.  Ultrasound RUQ showed gallstones without acute cholecystitis.  She had a laparoscopic cholecystectomy on 3/17.  She was discharged on 3/18 and advised to follow-up with her PCP.  Since discharge, she reports pain is improving. She denies nausea, vomiting, diarrhea or blood in her stool. Her bowels are moving normally.  Review of Systems      Past Medical History:  Diagnosis Date  . Allergy   . DDD (degenerative disc disease), lumbar   . Depression     Current Outpatient Medications  Medication Sig Dispense Refill  . ciprofloxacin (CIPRO) 500 MG tablet Take 1 tablet (500 mg total) by mouth 2 (two) times daily for 7 days. 14 tablet 0  . diclofenac sodium (VOLTAREN) 1 % GEL Apply 2 g topically 4 (four) times daily. (Patient taking differently: Apply 2 g topically 4 (four) times daily as needed. ) 100 g 0  . famotidine (PEPCID) 20 MG tablet Take 1 tablet (20 mg total) by mouth at bedtime. 30 tablet 0  . fexofenadine-pseudoephedrine (ALLEGRA-D 24) 180-240 MG 24 hr tablet TAKE 1 TABLET BY MOUTH EVERY DAY AS NEEDED 30 tablet 1  . fluticasone (FLONASE) 50 MCG/ACT nasal spray Place 2 sprays into both nostrils daily. (Patient taking differently: Place 2 sprays into both nostrils daily as needed. ) 16 g 6  . hydrOXYzine (ATARAX/VISTARIL) 10 MG tablet Take 1 tablet (10 mg total) by mouth daily as needed. 90 tablet 0  . ondansetron (ZOFRAN ODT) 4 MG disintegrating tablet Take 1 tablet (4 mg total) by mouth every 8 (eight) hours as needed. 20 tablet 0  . oxyCODONE (OXY IR/ROXICODONE) 5 MG immediate release tablet Take 1 tablet (5 mg total) by mouth every 6 (six) hours as needed for severe pain  or breakthrough pain. 20 tablet 0   No current facility-administered medications for this visit.    Allergies  Allergen Reactions  . Orange Concentrate [Flavoring Agent] Rash    Rash on right side of body  . Gabapentin Other (See Comments)    Hair loss  . Penicillins Other (See Comments)    Childhood allergy Has patient had a PCN reaction causing immediate rash, facial/tongue/throat swelling, SOB or lightheadedness with hypotension: Unknown Has patient had a PCN reaction causing severe rash involving mucus membranes or skin necrosis: Unknown Has patient had a PCN reaction that required hospitalization: No  Has patient had a PCN reaction occurring within the last 10 years: No  If all of the above answers are "NO", then may proceed with Cephalosporin use.     Family History  Problem Relation Age of Onset  . Hyperlipidemia Mother   . Heart disease Maternal Aunt   . Stroke Maternal Aunt   . Diabetes Maternal Aunt   . Heart disease Maternal Uncle   . Stroke Maternal Uncle   . Cancer Neg Hx     Social History   Socioeconomic History  . Marital status: Married    Spouse name: Not on file  . Number of children: Not on file  . Years of education: Not on file  . Highest education level: Not on file  Occupational History  . Not on file  Tobacco Use  . Smoking status: Never Smoker  . Smokeless tobacco: Never Used  Substance and Sexual Activity  . Alcohol use: Yes    Comment: social  . Drug use: No  . Sexual activity: Yes    Partners: Male    Birth control/protection: Post-menopausal, Surgical  Other Topics Concern  . Not on file  Social History Narrative  . Not on file   Social Determinants of Health   Financial Resource Strain:   . Difficulty of Paying Living Expenses:   Food Insecurity:   . Worried About Charity fundraiser in the Last Year:   . Arboriculturist in the Last Year:   Transportation Needs:   . Film/video editor (Medical):   [name] Lack of  Transportation (Non-Medical):   Physical Activity:   . Days of Exercise per Week:   . Minutes of Exercise per Session:   Stress:   . Feeling of Stress :   Social Connections:   . Frequency of Communication with Friends and Family:   . Frequency of Social Gatherings with Friends and Family:   . Attends Religious Services:   . Active Member of Clubs or Organizations:   . Attends Archivist Meetings:   [name] Marital Status:   Intimate Partner Violence:   . Fear of Current or Ex-Partner:   . Emotionally Abused:   [name] Physically Abused:   . Sexually Abused:      Constitutional: Denies fever, malaise, fatigue, headache or abrupt weight changes.  Respiratory: Denies difficulty breathing, shortness of breath, cough or sputum production.   Cardiovascular: Denies chest pain, chest tightness, palpitations or swelling in the hands or feet.  Gastrointestinal: Pt reports abdominal soreness around lap sites. Denies abdominal pain, bloating, constipation, diarrhea or blood in the stool.  Skin: Pt reports multiple lap sites. Denies redness, rashes, lesions or ulcercations.    No other specific complaints in a complete review of systems (except as listed in HPI above).  Objective:   Physical Exam  BP 124/78   Pulse (!) 110   Temp 97.8 F (36.6 C) (Temporal)   Wt 251 lb (113.9 kg)   SpO2 98%   BMI 41.13 kg/m   Wt Readings from Last 3 Encounters:  08/30/19 258 lb (117 kg)  08/23/19 258 lb (117 kg)  08/19/19 265 lb (120.2 kg)    General: Appears her stated age, obese in NAD. Skin: Lap sites intact. No redness or drainage noted. Cardiovascular: Tachycardic with normal rhythm. S1,S2 noted.  No murmur, rubs or gallops noted.  Pulmonary/Chest: Normal effort and positive vesicular breath sounds. No respiratory distress. No wheezes, rales or ronchi noted.  Abdomen: Soft and mildly tender in the RUQ. Normal bowel sounds. No distention or masses noted.  Neurological: Alert and oriented.      BMET    Component Value Date/Time   NA 141 08/31/2019 0504   NA 142 09/18/2016 0929   K 4.0 08/31/2019 0504   CL 108 08/31/2019 0504   CO2 25 08/31/2019 0504   GLUCOSE 103 (H) 08/31/2019 0504   BUN 5 (L) 08/31/2019 0504   BUN 9 09/18/2016 0929   CREATININE 0.55 08/31/2019 0504   CALCIUM 8.9 08/31/2019 0504   GFRNONAA >60 08/31/2019 0504   GFRAA >60 08/31/2019 0504    Lipid Panel     Component Value Date/Time   CHOL 186 02/25/2019 1005   CHOL 200 (H) 03/23/2017 0844   TRIG 96.0 02/25/2019 1005   HDL 52.40  02/25/2019 1005   HDL 57 03/23/2017 0844   CHOLHDL 4 02/25/2019 1005   VLDL 19.2 02/25/2019 1005   LDLCALC 115 (H) 02/25/2019 1005   LDLCALC 124 (H) 03/23/2017 0844    CBC    Component Value Date/Time   WBC 6.6 08/31/2019 0504   RBC 4.00 08/31/2019 0504   HGB 12.6 08/31/2019 0504   HGB 13.2 09/18/2016 0929   HCT 37.7 08/31/2019 0504   HCT 38.1 09/18/2016 0929   PLT 242 08/31/2019 0504   PLT 268 09/18/2016 0929   MCV 94.3 08/31/2019 0504   MCV 94 09/18/2016 0929   MCH 31.5 08/31/2019 0504   MCHC 33.4 08/31/2019 0504   RDW 12.1 08/31/2019 0504   RDW 12.8 09/18/2016 0929   LYMPHSABS 2.8 08/30/2019 0315   LYMPHSABS 2.8 08/03/2014 1609   MONOABS 0.6 08/30/2019 0315   EOSABS 0.1 08/30/2019 0315   EOSABS 0.1 08/03/2014 1609   BASOSABS 0.0 08/30/2019 0315   BASOSABS 0.0 08/03/2014 1609    Hgb A1C Lab Results  Component Value Date   HGBA1C 6.0 11/15/2018          Assessment & Plan:   Hospital Follow up for Cholelithiasis s/p Acute Cholecystectomy:  ER notes, labs and imaging reviewed Lab sites intact, no signs of infection She has a follow up appt with gen surgery FMLA forms completed  Return precautions discussed Webb Silversmith, NP This visit occurred during the SARS-CoV-2 public health emergency.  Safety protocols were in place, including screening questions prior to the visit, additional usage of staff PPE, and extensive cleaning of exam  room while observing appropriate contact time as indicated for disinfecting solutions.

## 2019-09-06 NOTE — Patient Instructions (Signed)
Cholecystostomy Cholecystostomy is a procedure to drain fluid from the gallbladder by using a flexible drainage tube (catheter). The gallbladder is a pear-shaped organ that lies beneath the liver on the right side of the body. The gallbladder stores bile, which is a fluid that helps the body digest fats. You may have this procedure:  If your gallbladder is infected due to gallstones (cholecystitis).  To control a gallbladder infection if you cannot have gallbladder surgery. Tell a health care provider about:  Any allergies you have.  All medicines you are taking, including vitamins, herbs, eye drops, creams, and over-the-counter medicines.  Any problems you or family members have had with anesthetic medicines.  Any blood disorders you have.  Any surgeries you have had.  Any medical conditions you have.  Whether you are pregnant or may be pregnant. What are the risks? Generally, this is a safe procedure. However, problems may occur, including:  The catheter moving out of place.  Clogging of the catheter.  Infection of the incision site.  Internal bleeding.  Leakage of bile from the gallbladder.  Infection inside the abdomen (peritonitis).  Damage to other structures or organs.  Low blood pressure and slowed heart rate.  Allergic reactions to medicines or dyes. What happens before the procedure? Most often, you will already be in the hospital receiving treatment for a gallbladder infection or other problems with the gallbladder. Staying hydrated Follow instructions from your health care provider about hydration, which may include:  Up to 2 hours before the procedure - you may continue to drink clear liquids, such as water, clear fruit juice, black coffee, and plain tea.  Eating and drinking Follow instructions from your health care provider about eating and drinking, which may include:  8 hours before the procedure - stop eating heavy meals or foods, such as meat,  fried foods, or fatty foods.  6 hours before the procedure - stop eating light meals or foods, such as toast or cereal.  6 hours before the procedure - stop drinking milk or drinks that contain milk.  2 hours before the procedure - stop drinking clear liquids. Medicines  Ask your health care provider about: ? Changing or stopping your regular medicines. This is especially important if you are taking diabetes medicines or blood thinners. ? Taking medicines such as aspirin and ibuprofen. These medicines can thin your blood. Do not take these medicines unless your health care provider tells you to take them. ? Taking over-the-counter medicines, vitamins, herbs, and supplements. General instructions  You may have an exam or testing, including: ? Imaging studies of your gallbladder. ? Blood tests.  Do not use any products that contain nicotine or tobacco before the procedure. These products include cigarettes, e-cigarettes, and chewing tobacco. If you need help quitting, ask your health care provider. Also, do not use these products after the procedure.  Plan to have someone take you home from the hospital or clinic.  If you will be going home right after the procedure, plan to have someone with you for 24 hours.  Ask your health care provider how your surgical site will be marked or identified.  Ask your health care provider what steps will be taken to help prevent infection. These may include: ? Removing hair at the surgery site. ? Washing skin with a germ-killing soap. ? Taking antibiotic medicine. What happens during the procedure?  An IV will be inserted into one of your veins.  You will be given one or more of the  following: ? A medicine to help you relax (sedative). ? A medicine to numb the area (local anesthetic).  A small incision will be made in your abdomen.  A long needle or a wide puncturing tool (trocar) will be put through the incision.  Your health care provider  will use an imaging study (ultrasound) to guide the needle or trocar into your gallbladder.  After the needle or trocar is in your gallbladder, a small amount of dye may be injected. An X-ray may be taken to make sure that the needle is in the correct place.  A catheter will be placed through the needle.  The needle or trocar will be removed.  The catheter will be secured to your skin with stitches (sutures).  The catheter will be connected to a drainage bag. Fluid will drain from the gallbladder into the bag. Some of this bile may be sent to the lab to be tested.  A bandage (dressing) will be placed over the incision site where the catheter was placed. The procedure may vary among health care providers and hospitals. What happens after the procedure?  Your blood pressure, heart rate, breathing rate, and blood oxygen level will be monitored until you leave the hospital or clinic.  Dye may be injected through your catheter to check the catheter and your gallbladder.  Your gallbladder may be flushed out (irrigated) through the catheter.  Your catheter and drainage bag may need to stay in place for several weeks or as told by your health care provider. Summary  Cholecystostomy is a procedure to drain fluid from the gallbladder using a flexible drainage tube (catheter).  Generally, this is a safe procedure. However, problems may occur, including bleeding, infection, clogging of the catheter, damage to other structures or organs, or low blood pressure and slowed heart rate.  Follow instructions before the procedure. You will be told when to stop all food and drink, whether to change or stop any medicines, and what tests need to be done.  After the procedure, you will be monitored in the hospital, and you may have a catheter and drainage bag when you go home. This information is not intended to replace advice given to you by your health care provider. Make sure you discuss any questions you  have with your health care provider. Document Revised: 12/28/2017 Document Reviewed: 12/28/2017 Elsevier Patient Education  [place].

## 2019-09-08 ENCOUNTER — Encounter: Payer: Self-pay | Admitting: Internal Medicine

## 2019-09-08 DIAGNOSIS — R739 Hyperglycemia, unspecified: Secondary | ICD-10-CM

## 2019-09-08 DIAGNOSIS — E559 Vitamin D deficiency, unspecified: Secondary | ICD-10-CM

## 2019-09-09 ENCOUNTER — Ambulatory Visit (INDEPENDENT_AMBULATORY_CARE_PROVIDER_SITE_OTHER): Payer: Self-pay

## 2019-09-09 ENCOUNTER — Other Ambulatory Visit (INDEPENDENT_AMBULATORY_CARE_PROVIDER_SITE_OTHER): Payer: 59

## 2019-09-09 ENCOUNTER — Telehealth: Payer: Self-pay | Admitting: *Deleted

## 2019-09-09 ENCOUNTER — Other Ambulatory Visit: Payer: Self-pay

## 2019-09-09 DIAGNOSIS — R739 Hyperglycemia, unspecified: Secondary | ICD-10-CM

## 2019-09-09 DIAGNOSIS — E559 Vitamin D deficiency, unspecified: Secondary | ICD-10-CM

## 2019-09-09 DIAGNOSIS — K811 Chronic cholecystitis: Secondary | ICD-10-CM

## 2019-09-09 LAB — VITAMIN D 25 HYDROXY (VIT D DEFICIENCY, FRACTURES): VITD: 21.5 ng/mL — ABNORMAL LOW (ref 30.00–100.00)

## 2019-09-09 LAB — HEMOGLOBIN A1C: Hgb A1c MFr Bld: 5.9 % (ref 4.6–6.5)

## 2019-09-09 MED ORDER — VITAMIN D (ERGOCALCIFEROL) 1.25 MG (50000 UNIT) PO CAPS: 50000.0000 [IU] | ORAL_CAPSULE | ORAL | 0 refills | Status: DC

## 2019-09-09 NOTE — Addendum Note (Signed)
Addended by: Jearld Fenton on: 09/09/2019 02:51 PM   Modules accepted: Orders

## 2019-09-09 NOTE — Telephone Encounter (Signed)
Patient called and stated that she had surgery on 08/31/2019 by Dr Hampton Abbot Laparoscopic Cholecystectomy and her incision is opening up some and has dome drainage that is light pink in color. No redness. Please call and advise

## 2019-09-09 NOTE — Telephone Encounter (Signed)
Per Carl-lyn instructed me to contact [name] to have her come in today for a nurse visit for a wound check. [name] verbalized she would be able to come in after taking her daughter to work. [name] is scheduled for 1:45pm. [name] states currently has one more day of her antibiotics. [name] denies having any fever, vomit, chills or nausea. [name] verbalized understanding and has no further questions.

## 2019-09-09 NOTE — Progress Notes (Signed)
Patient came in today for a wound check.The wound is clean, with no signs of infection noted. Small amount of clear, pink tinged drainage at top surgical site. No redness or irritation noted. Patient instructed to wash and let the warm soapy water run over the area, rinse well, and pat dry. May place a Band-Aid over the area to protect her clothing. Follow up as scheduled.

## 2019-09-09 NOTE — Progress Notes (Signed)
e

## 2019-09-12 ENCOUNTER — Encounter: Payer: Self-pay | Admitting: Internal Medicine

## 2019-09-14 ENCOUNTER — Other Ambulatory Visit: Payer: Self-pay

## 2019-09-14 ENCOUNTER — Ambulatory Visit (INDEPENDENT_AMBULATORY_CARE_PROVIDER_SITE_OTHER): Payer: Self-pay | Admitting: Physician Assistant

## 2019-09-14 ENCOUNTER — Encounter: Payer: Self-pay | Admitting: Physician Assistant

## 2019-09-14 VITALS — BP 112/76 | HR 98 | Temp 97.9°F | Resp 14 | Ht 65.5 in | Wt 252.0 lb

## 2019-09-14 DIAGNOSIS — K811 Chronic cholecystitis: Secondary | ICD-10-CM

## 2019-09-14 DIAGNOSIS — Z09 Encounter for follow-up examination after completed treatment for conditions other than malignant neoplasm: Secondary | ICD-10-CM

## 2019-09-14 NOTE — Telephone Encounter (Signed)
Pt aware °Copy for scan °Copy for pt °

## 2019-09-14 NOTE — Patient Instructions (Addendum)
Follow-up with our office as needed.  Please call and ask to speak with a nurse if you develop questions or concerns.  Keep the surgical area in the belly button dry and clean.   You may return to work now with restrictions for 2-3 more weeks.   GENERAL POST-OPERATIVE PATIENT INSTRUCTIONS   WOUND CARE INSTRUCTIONS:  Keep a dry clean dressing on the wound if there is drainage. The initial bandage may be removed after 24 hours.  Once the wound has quit draining you may leave it open to air.  If clothing rubs against the wound or causes irritation and the wound is not draining you may cover it with a dry dressing during the daytime.  Try to keep the wound dry and avoid ointments on the wound unless directed to do so.  If the wound becomes bright red and painful or starts to drain infected material that is not clear, please contact your physician immediately.  If the wound is mildly pink and has a thick firm ridge underneath it, this is normal, and is referred to as a healing ridge.  This will resolve over the next 4-6 weeks.  BATHING: You may shower if you have been informed of this by your surgeon. However, Please do not submerge in a tub, hot tub, or pool until incisions are completely sealed or have been told by your surgeon that you may do so.  DIET:  You may eat any foods that you can tolerate.  It is a good idea to eat a high fiber diet and take in plenty of fluids to prevent constipation.  If you do become constipated you may want to take a mild laxative or take ducolax tablets on a daily basis until your bowel habits are regular.  Constipation can be very uncomfortable, along with straining, after recent surgery.  ACTIVITY:  You are encouraged to cough and deep breath or use your incentive spirometer if you were given one, every 15-30 minutes when awake.  This will help prevent respiratory complications and low grade fevers post-operatively if you had a general anesthetic.  You may want to hug  a pillow when coughing and sneezing to add additional support to the surgical area, if you had abdominal or chest surgery, which will decrease pain during these times.  You are encouraged to walk and engage in light activity for the next two weeks.  You should not lift more than 15 pounds for 6 weeks after surgery as it could put you at increased risk for complications.  Fifteen pounds is roughly equivalent to a plastic bag of groceries. At that time- Listen to your body when lifting, if you have pain when lifting, stop and then try again in a few days. Soreness after doing exercises or activities of daily living is normal as you get back in to your normal routine.  MEDICATIONS:  Try to take narcotic medications and anti-inflammatory medications, such as tylenol, ibuprofen, naprosyn, etc., with food.  This will minimize stomach upset from the medication.  Should you develop nausea and vomiting from the pain medication, or develop a rash, please discontinue the medication and contact your physician.  You should not drive, make important decisions, or operate machinery when taking narcotic pain medication.  SUNBLOCK Use sun block to incision area over the next year if this area will be exposed to sun. This helps decrease scarring and will allow you avoid a permanent darkened area over your incision.  QUESTIONS:  Please feel free  to call our office if you have any questions, and we will be glad to assist you.

## 2019-09-14 NOTE — Progress Notes (Signed)
[facility] POST-OP OFFICE VISIT  09/14/2019  HPI: [name] is a 54 y.o. female 14 days s/p laparoscopic cholecystitis for chronic cholecystitis with Dr Hampton Abbot.   Overall doing well Some soreness at epigastric incision, controlled with ibuprofen/tylenol No fever, chills, nausea, or emesis No trouble with PO intake Normal bowel function, no diarrhea  Vital signs: There were no vitals taken for this visit.   Physical Exam: Constitutional: Well appearing female, NAD Abdomen: Soft, non-tender, non-distended, no rebound/guarding Skin: Laparscopic incisions are CDI, no erythema or drainage  Assessment/Plan: This is a 54 y.o. female laparoscopic cholecystitis for chronic cholecystitis.   - Pain control prn; tylenol + ibuprofen  - reviewed wound care  - reviewed lifting restrictions for 2 more weeks  - okay to return to work Monday if able to follow restrictions  - reviewed pathology: Acute on chronic cholecystitis, no malignancy  - rtc prn  -- Edison Simon, PA-C [facility] 09/14/2019, 10:33 AM [phone] M-F: 7am - 4pm

## 2019-09-19 ENCOUNTER — Telehealth: Payer: Self-pay | Admitting: Internal Medicine

## 2019-09-19 ENCOUNTER — Encounter: Payer: Self-pay | Admitting: Internal Medicine

## 2019-09-19 ENCOUNTER — Telehealth: Payer: Self-pay | Admitting: *Deleted

## 2019-09-19 NOTE — Telephone Encounter (Signed)
2 forms fmla source one and the [place] in [facility] in box for review and signature  Pt needed to extend leave to 09/25/19 See note dated 4/5 from dr Raquel Sarna harris  Pt aware regina is out of office till Monday 4/12

## 2019-09-19 NOTE — Telephone Encounter (Signed)
Will address this when I return.

## 2019-09-19 NOTE — Telephone Encounter (Signed)
Patient called and was suppose to return back to work today but she is still having some pain and discomfort from the surgery and wanted to know if we can extend her return back to work note for later this week, she is not comfortable going back to work.  She had surgery on 08/31/19 Dr Hampton Abbot Gallbladder

## 2019-09-19 NOTE — Telephone Encounter (Signed)
Per Dr.Piscoya states he is OK with patient to remain out of work for another week due to the discomfort or pain she is feeling and requested to be out of work. Patient states she would like a work excuse note until Monday April 12th with light duties and if she does not feel any better she will contact our office to keep [place] up to date. Letter completed and sent to patient via [place]. Patient verbalized understanding and has no further questions.

## 2019-09-22 ENCOUNTER — Other Ambulatory Visit: Payer: Self-pay | Admitting: Internal Medicine

## 2019-09-22 ENCOUNTER — Telehealth: Payer: Self-pay | Admitting: Surgery

## 2019-09-22 NOTE — Telephone Encounter (Signed)
Letter completed and placed at front desk. Patient states she will pick the letter up tomorrow before closing. Patient verbalizes understanding.

## 2019-09-22 NOTE — Telephone Encounter (Signed)
Patient is asking for a doctors note to return to work on the 28th, patient said she didn't feel good enough to go back early.

## 2019-09-23 ENCOUNTER — Encounter: Payer: Self-pay | Admitting: Internal Medicine

## 2019-09-26 ENCOUNTER — Telehealth: Payer: Self-pay | Admitting: *Deleted

## 2019-09-26 NOTE — Telephone Encounter (Signed)
noted 

## 2019-09-26 NOTE — Telephone Encounter (Signed)
Faxed FMLA to [employer] at [phone] and [employer] at [phone]

## 2019-09-28 ENCOUNTER — Other Ambulatory Visit: Payer: Self-pay

## 2019-09-28 ENCOUNTER — Ambulatory Visit (INDEPENDENT_AMBULATORY_CARE_PROVIDER_SITE_OTHER): Payer: 59 | Admitting: Surgery

## 2019-09-28 ENCOUNTER — Encounter: Payer: Self-pay | Admitting: Surgery

## 2019-09-28 VITALS — BP 147/90 | HR 92 | Temp 95.7°F | Ht 66.0 in | Wt 255.4 lb

## 2019-09-28 DIAGNOSIS — K811 Chronic cholecystitis: Secondary | ICD-10-CM

## 2019-09-28 DIAGNOSIS — Z09 Encounter for follow-up examination after completed treatment for conditions other than malignant neoplasm: Secondary | ICD-10-CM

## 2019-09-28 NOTE — Progress Notes (Signed)
09/28/2019  HPI: [name] is a 54 y.o. female s/p laparoscopic cholecystectomy on 3/17.  She presents today for follow up.  She called yesterday because she noted some irritation and pain at the umbilical incision.  She also has been getting sharp pains on occasion at the epigastric incision.  Denies fevers, chills.  Vital signs: BP (!) 147/90   Pulse 92   Temp (!) 95.7 F (35.4 C) (Temporal)   Ht 5\' 6"  (1.676 m)   Wt 255 lb 6.4 oz (115.8 kg)   SpO2 98%   BMI 41.22 kg/m    Physical Exam: Constitutional:  No acute distress Abdomen:  Soft, obese, non-distended, with some soreness at the umbilical and epigastric incisions.  The umbilical incision has a superficial area at the midportion superior edge where appears to have had a blister that burst vs a very superficial burn.  There is no drainage, and there is no erythema/induration.  The epigastric incision is healing well, without any evidence of hernia on straining.  The other two right lateal incisions also healing well.  Assessment/Plan: This is a 54 y.o. female s/p laparoscopic cholecystectomy.  --Overall, the incisions are healing well.  I do not see any evidence of infection.  The umbilical incision may have had a small burn or blister which is why it's raw in a small portion of it, but there is no wound dehiscence or drainage at this point.  May apply daily BandAid to protect the wound.  May use Neosporin if patient wishes. --The epigastric incision is healing well.  There is no evidence of infection.  May have had an irritated sensory skin nerve but this should improve with time.  --She's 4 weeks out now from her surgery, she may resume activities and start ramping up.  At 6 weeks, she can do any activity she wishes. --May go back to work. --Follow up prn.   Melvyn Neth, [facility]

## 2019-09-28 NOTE — Patient Instructions (Addendum)
Dr.Piscoya recommend patient to continue to take the Ibuprofen for any pain or discomfort. He also recommended patient to apply a band-aid or gauze to the area to keep the area dry. Please give our office a call if you notice fever, chills or nausea and vomiting. Patient may slowly getting back into light activity, but still no heavy lifting of 20 pounds or more for two more weeks.  Wound Care, Adult Taking care of your wound properly can help to prevent pain, infection, and scarring. It can also help your wound to heal more quickly. How to care for your wound Wound care      Follow instructions from your health care provider about how to take care of your wound. Make sure you: ? Wash your hands with soap and water before you change the bandage (dressing). If soap and water are not available, use hand sanitizer. ? Change your dressing as told by your health care provider. ? Leave stitches (sutures), skin glue, or adhesive strips in place. These skin closures may need to stay in place for 2 weeks or longer. If adhesive strip edges start to loosen and curl up, you may trim the loose edges. Do not remove adhesive strips completely unless your health care provider tells you to do that.  Check your wound area every day for signs of infection. Check for: ? Redness, swelling, or pain. ? Fluid or blood. ? Warmth. ? Pus or a bad smell.  Ask your health care provider if you should clean the wound with mild soap and water. Doing this may include: ? Using a clean towel to pat the wound dry after cleaning it. Do not rub or scrub the wound. ? Applying a cream or ointment. Do this only as told by your health care provider. ? Covering the incision with a clean dressing.  Ask your health care provider when you can leave the wound uncovered.  Keep the dressing dry until your health care provider says it can be removed. Do not take baths, swim, use a hot tub, or do anything that would put the wound  underwater until your health care provider approves. Ask your health care provider if you can take showers. You may only be allowed to take sponge baths. Medicines   If you were prescribed an antibiotic medicine, cream, or ointment, take or use the antibiotic as told by your health care provider. Do not stop taking or using the antibiotic even if your condition improves.  Take over-the-counter and prescription medicines only as told by your health care provider. If you were prescribed pain medicine, take it 30 or more minutes before you do any wound care or as told by your health care provider. General instructions  Return to your normal activities as told by your health care provider. Ask your health care provider what activities are safe.  Do not scratch or pick at the wound.  Do not use any products that contain nicotine or tobacco, such as cigarettes and e-cigarettes. These may delay wound healing. If you need help quitting, ask your health care provider.  Keep all follow-up visits as told by your health care provider. This is important.  Eat a diet that includes protein, vitamin A, vitamin C, and other nutrient-rich foods to help the wound heal. ? Foods rich in protein include meat, dairy, beans, nuts, and other sources. ? Foods rich in vitamin A include carrots and dark green, leafy vegetables. ? Foods rich in vitamin C include citrus, tomatoes, and  other fruits and vegetables. ? Nutrient-rich foods have protein, carbohydrates, fat, vitamins, or minerals. Eat a variety of healthy foods including vegetables, fruits, and whole grains. Contact a health care provider if:  You received a tetanus shot and you have swelling, severe pain, redness, or bleeding at the injection site.  Your pain is not controlled with medicine.  You have redness, swelling, or pain around the wound.  You have fluid or blood coming from the wound.  Your wound feels warm to the touch.  You have pus or a bad  smell coming from the wound.  You have a fever or chills.  You are nauseous or you vomit.  You are dizzy. Get help right away if:  You have a red streak going away from your wound.  The edges of the wound open up and separate.  Your wound is bleeding, and the bleeding does not stop with gentle pressure.  You have a rash.  You faint.  You have trouble breathing. Summary  Always wash your hands with soap and water before changing your bandage (dressing).  To help with healing, eat foods that are rich in protein, vitamin A, vitamin C, and other nutrients.  Check your wound every day for signs of infection. Contact your health care provider if you suspect that your wound is infected. This information is not intended to replace advice given to you by your health care provider. Make sure you discuss any questions you have with your health care provider. Document Revised: 09/20/2018 Document Reviewed: 12/18/2015 Elsevier Patient Education  [place].

## 2019-09-30 ENCOUNTER — Encounter: Payer: Self-pay | Admitting: Internal Medicine

## 2019-09-30 ENCOUNTER — Other Ambulatory Visit: Payer: Self-pay

## 2019-09-30 ENCOUNTER — Ambulatory Visit: Payer: 59 | Admitting: Internal Medicine

## 2019-09-30 VITALS — BP 122/84 | HR 92 | Temp 98.4°F | Wt 254.0 lb

## 2019-09-30 DIAGNOSIS — Z9049 Acquired absence of other specified parts of digestive tract: Secondary | ICD-10-CM

## 2019-09-30 DIAGNOSIS — R1011 Right upper quadrant pain: Secondary | ICD-10-CM

## 2019-09-30 NOTE — Progress Notes (Signed)
Subjective:    Patient ID: [name], female    DOB: 12-26-1965, 54 y.o.   MRN: [id number]  HPI  Pt presents to the clinic today for follow up from her recent surgical post op appt. She is 4 weeks post op from a cholecystectomy by Dr. Hampton Abbot. She reports persistent pain in 2/4 lap sites. She describes the pain as sharp and burning. She reports the pain feels deeper than the skin but not quite in the organs. The pain is intermittent and is not related to certain positions. She has not noticed any redness or drainage from the lap sites. She saw Dr. Hampton Abbot 4/14- he released her back to work. She reports he filled out the FMLA to end on the 28th. She does not feel like she can go back at work due to the pain.  Review of Systems      Past Medical History:  Diagnosis Date  . Allergy   . DDD (degenerative disc disease), lumbar   . Depression     Current Outpatient Medications  Medication Sig Dispense Refill  . ALLEGRA-D ALLERGY & CONGESTION 180-240 MG 24 hr tablet TAKE 1 TABLET BY MOUTH EVERY DAY AS NEEDED 30 tablet 1  . diclofenac sodium (VOLTAREN) 1 % GEL Apply 2 g topically 4 (four) times daily. (Patient not taking: Reported on 09/28/2019) 100 g 0  . DUEXIS 800-26.6 MG TABS Take 1 tablet by mouth 3 (three) times daily as needed.    . famotidine (PEPCID) 20 MG tablet Take 1 tablet (20 mg total) by mouth at bedtime. 30 tablet 0  . fluticasone (FLONASE) 50 MCG/ACT nasal spray Place 2 sprays into both nostrils daily. (Patient not taking: Reported on 09/28/2019) 16 g 6  . hydrOXYzine (ATARAX/VISTARIL) 10 MG tablet Take 1 tablet (10 mg total) by mouth daily as needed. 90 tablet 0  . oxyCODONE (OXY IR/ROXICODONE) 5 MG immediate release tablet Take 1 tablet (5 mg total) by mouth every 6 (six) hours as needed for severe pain or breakthrough pain. (Patient not taking: Reported on 09/28/2019) 20 tablet 0  . Vitamin D, Ergocalciferol, (DRISDOL) 1.25 MG (50000 UNIT) CAPS capsule Take 1 capsule  (50,000 Units total) by mouth every 7 (seven) days. 12 capsule 0   No current facility-administered medications for this visit.    Allergies  Allergen Reactions  . Orange Concentrate [Flavoring Agent] Rash    Rash on right side of body  . Gabapentin Other (See Comments)    Hair loss  . Penicillins Other (See Comments)    Childhood allergy Has patient had a PCN reaction causing immediate rash, facial/tongue/throat swelling, SOB or lightheadedness with hypotension: Unknown Has patient had a PCN reaction causing severe rash involving mucus membranes or skin necrosis: Unknown Has patient had a PCN reaction that required hospitalization: No  Has patient had a PCN reaction occurring within the last 10 years: No  If all of the above answers are "NO", then may proceed with Cephalosporin use.     Family History  Problem Relation Age of Onset  . Hyperlipidemia Mother   . Heart disease Maternal Aunt   . Stroke Maternal Aunt   . Diabetes Maternal Aunt   . Heart disease Maternal Uncle   . Stroke Maternal Uncle   . Cancer Neg Hx     Social History   Socioeconomic History  . Marital status: Married    Spouse name: Not on file  . Number of children: Not on file  . Years  of education: Not on file  . Highest education level: Not on file  Occupational History  . Not on file  Tobacco Use  . Smoking status: Never Smoker  . Smokeless tobacco: Never Used  Substance and Sexual Activity  . Alcohol use: Yes    Comment: social  . Drug use: No  . Sexual activity: Yes    Partners: Male    Birth control/protection: Post-menopausal, Surgical  Other Topics Concern  . Not on file  Social History Narrative  . Not on file   Social Determinants of Health   Financial Resource Strain:   . Difficulty of Paying Living Expenses:   Food Insecurity:   . Worried About Charity fundraiser in the Last Year:   . Arboriculturist in the Last Year:   Transportation Needs:   . Film/video editor  (Medical):   [name] Lack of Transportation (Non-Medical):   Physical Activity:   . Days of Exercise per Week:   . Minutes of Exercise per Session:   Stress:   . Feeling of Stress :   Social Connections:   . Frequency of Communication with Friends and Family:   . Frequency of Social Gatherings with Friends and Family:   . Attends Religious Services:   . Active Member of Clubs or Organizations:   . Attends Archivist Meetings:   [name] Marital Status:   Intimate Partner Violence:   . Fear of Current or Ex-Partner:   . Emotionally Abused:   [name] Physically Abused:   . Sexually Abused:      Constitutional: Denies fever, malaise, fatigue, headache or abrupt weight changes.  Respiratory: Denies difficulty breathing, shortness of breath, cough or sputum production.   Cardiovascular: Denies chest pain, chest tightness, palpitations or swelling in the hands or feet.  Gastrointestinal: Pt reports abdominal pain intermittent constipation. Denies bloating, blood in the stool.  GU: Denies urgency, frequency, pain with urination, burning sensation, blood in urine, odor or discharge. Skin: Pt reports 4 healing lap sites. Denies redness, rashes, lesions or ulcercations.    No other specific complaints in a complete review of systems (except as listed in HPI above).  Objective:   Physical Exam  BP 122/84   Pulse 92   Temp 98.4 F (36.9 C) (Temporal)   Wt 254 lb (115.2 kg)   SpO2 97%   BMI 41.00 kg/m   Wt Readings from Last 3 Encounters:  09/28/19 255 lb 6.4 oz (115.8 kg)  09/14/19 252 lb (114.3 kg)  09/06/19 251 lb (113.9 kg)    General: Appears her stated age, obese, in NAD. Skin: 4 lap sites, normal healing. No concern for infection. Cardiovascular: Normal rate and rhythm. S1,S2 noted.  No murmur, rubs or gallops noted. Pulmonary/Chest: Normal effort and positive vesicular breath sounds. No respiratory distress. No wheezes, rales or ronchi noted.  Abdomen: Soft and tender around  the lap site in the epigastric and umbilical area. Normal bowel sounds. No distention or masses noted.  Neurological: Alert and oriented.  Psychiatric: Tearful.  BMET    Component Value Date/Time   NA 137 09/06/2019 1241   NA 142 09/18/2016 0929   K 3.7 09/06/2019 1241   CL 105 09/06/2019 1241   CO2 24 09/06/2019 1241   GLUCOSE 128 (H) 09/06/2019 1241   BUN 11 09/06/2019 1241   BUN 9 09/18/2016 0929   CREATININE 0.78 09/06/2019 1241   CALCIUM 9.9 09/06/2019 1241   GFRNONAA >60 08/31/2019 0504   GFRAA >  60 08/31/2019 0504    Lipid Panel     Component Value Date/Time   CHOL 186 02/25/2019 1005   CHOL 200 (H) 03/23/2017 0844   TRIG 96.0 02/25/2019 1005   HDL 52.40 02/25/2019 1005   HDL 57 03/23/2017 0844   CHOLHDL 4 02/25/2019 1005   VLDL 19.2 02/25/2019 1005   LDLCALC 115 (H) 02/25/2019 1005   LDLCALC 124 (H) 03/23/2017 0844    CBC    Component Value Date/Time   WBC 7.2 09/06/2019 1241   RBC 4.07 09/06/2019 1241   HGB 13.2 09/06/2019 1241   HGB 13.2 09/18/2016 0929   HCT 38.4 09/06/2019 1241   HCT 38.1 09/18/2016 0929   PLT 286.0 09/06/2019 1241   PLT 268 09/18/2016 0929   MCV 94.4 09/06/2019 1241   MCV 94 09/18/2016 0929   MCH 31.5 08/31/2019 0504   MCHC 34.4 09/06/2019 1241   RDW 13.1 09/06/2019 1241   RDW 12.8 09/18/2016 0929   LYMPHSABS 2.8 08/30/2019 0315   LYMPHSABS 2.8 08/03/2014 1609   MONOABS 0.6 08/30/2019 0315   EOSABS 0.1 08/30/2019 0315   EOSABS 0.1 08/03/2014 1609   BASOSABS 0.0 08/30/2019 0315   BASOSABS 0.0 08/03/2014 1609    Hgb A1C Lab Results  Component Value Date   HGBA1C 5.9 09/09/2019          Assessment & Plan:   RUQ/Epigastric Pain s/p Cholecystectomy:  Discussed that she should not be having this much pain 4 weeks postop STAT RUQ abdominal ultrasound- will get this scheduled today Advised her that if the surgeon released her back to work, I can not write her out any longer.  Webb Silversmith, NP This visit occurred  during the SARS-CoV-2 public health emergency.  Safety protocols were in place, including screening questions prior to the visit, additional usage of staff PPE, and extensive cleaning of exam room while observing appropriate contact time as indicated for disinfecting solutions.

## 2019-10-02 ENCOUNTER — Encounter: Payer: Self-pay | Admitting: Internal Medicine

## 2019-10-02 NOTE — Telephone Encounter (Signed)
Please print off copy of note and send to [facility] at fax number provided.

## 2019-10-02 NOTE — Patient Instructions (Signed)

## 2019-10-03 ENCOUNTER — Telehealth: Payer: Self-pay | Admitting: *Deleted

## 2019-10-03 ENCOUNTER — Ambulatory Visit
Admission: RE | Admit: 2019-10-03 | Discharge: 2019-10-03 | Disposition: A | Discharge status: Home / Self Care | Payer: 59 | Source: Ambulatory Visit | Attending: Internal Medicine | Admitting: Internal Medicine

## 2019-10-03 ENCOUNTER — Other Ambulatory Visit: Payer: Self-pay

## 2019-10-03 DIAGNOSIS — Z9049 Acquired absence of other specified parts of digestive tract: Secondary | ICD-10-CM | POA: Diagnosis present

## 2019-10-03 DIAGNOSIS — R1011 Right upper quadrant pain: Secondary | ICD-10-CM | POA: Diagnosis not present

## 2019-10-03 NOTE — Telephone Encounter (Signed)
Faxed FMLA to [employer] [phone]

## 2019-10-03 NOTE — Telephone Encounter (Signed)
Abby at [facility] left a voicemail wanting to let Webb Silversmith NP know that patient had an ultrasound.  Abby stated that the report is in Epic. Abby said to call her back if you have any questions.

## 2019-10-03 NOTE — Telephone Encounter (Signed)
Results have been seen and pt has been sent a [employer].

## 2019-10-06 ENCOUNTER — Telehealth: Payer: Self-pay | Admitting: Surgery

## 2019-10-06 NOTE — Telephone Encounter (Signed)
Spoke with patient to gather more information.  Patient is requesting a work note to return back to work on 10/10/19. Patient states she will stop by and pick up the letter tomorrow before 3pm. Letter completed and placed at the front desk for patient to pick up.

## 2019-10-06 NOTE — Telephone Encounter (Signed)
Patient calls and is needing something in writing stating that she can return back to work on Monday 10/10/19.  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  Please call her.  Thank you.

## 2019-10-18 ENCOUNTER — Encounter: Payer: Self-pay | Admitting: Internal Medicine

## 2019-10-18 MED ORDER — HYDROXYZINE HCL 25 MG PO TABS: 25.0000 mg | ORAL_TABLET | Freq: Every day | ORAL | 2 refills | Status: DC | PRN

## 2019-10-18 MED ORDER — FAMOTIDINE 20 MG PO TABS: 20.0000 mg | ORAL_TABLET | Freq: Every day | ORAL | 1 refills | Status: DC

## 2019-10-18 NOTE — Addendum Note (Signed)
Addended by: Jearld Fenton on: 10/18/2019 03:13 PM   Modules accepted: Orders

## 2019-11-04 ENCOUNTER — Other Ambulatory Visit: Payer: Self-pay | Admitting: Internal Medicine

## 2019-11-11 ENCOUNTER — Encounter: Payer: Self-pay | Admitting: Internal Medicine

## 2019-11-11 MED ORDER — DUEXIS 800-26.6 MG PO TABS: 1.0000 | ORAL_TABLET | Freq: Three times a day (TID) | ORAL | 1 refills | Status: DC | PRN

## 2019-11-11 MED ORDER — TRAMADOL HCL 50 MG PO TABS: 50.0000 mg | ORAL_TABLET | Freq: Every day | ORAL | 0 refills | Status: DC | PRN

## 2019-11-14 ENCOUNTER — Other Ambulatory Visit: Payer: Self-pay | Admitting: Internal Medicine

## 2019-11-16 NOTE — Telephone Encounter (Signed)
Requesting for 90 day supply... please advise if okay to refill

## 2019-11-25 ENCOUNTER — Encounter: Payer: Self-pay | Admitting: Internal Medicine

## 2019-11-25 MED ORDER — TRAMADOL HCL 50 MG PO TABS: 50.0000 mg | ORAL_TABLET | Freq: Every day | ORAL | 0 refills | Status: AC | PRN

## 2019-11-25 NOTE — Telephone Encounter (Signed)
I called [facility] to have them d/c the original Tramadol Rx refill from 11/11/2019, this Rx pt wanted sent to [facility], she only has Duexis sent to [facility]... please advise

## 2019-11-30 ENCOUNTER — Encounter: Payer: Self-pay | Admitting: Internal Medicine

## 2019-12-01 ENCOUNTER — Emergency Department
Admission: EM | Admit: 2019-12-01 | Discharge: 2019-12-01 | Disposition: A | Discharge status: Home / Self Care | Payer: 59 | Attending: Emergency Medicine | Admitting: Emergency Medicine

## 2019-12-01 ENCOUNTER — Other Ambulatory Visit: Payer: Self-pay

## 2019-12-01 ENCOUNTER — Emergency Department: Payer: 59

## 2019-12-01 ENCOUNTER — Telehealth: Payer: Self-pay

## 2019-12-01 DIAGNOSIS — K2970 Gastritis, unspecified, without bleeding: Secondary | ICD-10-CM | POA: Insufficient documentation

## 2019-12-01 DIAGNOSIS — R1013 Epigastric pain: Secondary | ICD-10-CM | POA: Diagnosis present

## 2019-12-01 DIAGNOSIS — Z79899 Other long term (current) drug therapy: Secondary | ICD-10-CM | POA: Insufficient documentation

## 2019-12-01 DIAGNOSIS — R110 Nausea: Secondary | ICD-10-CM | POA: Diagnosis not present

## 2019-12-01 DIAGNOSIS — R1011 Right upper quadrant pain: Secondary | ICD-10-CM

## 2019-12-01 LAB — COMPREHENSIVE METABOLIC PANEL
ALT: 40 U/L (ref 0–44)
AST: 69 U/L — ABNORMAL HIGH (ref 15–41)
Albumin: 4 g/dL (ref 3.5–5.0)
Alkaline Phosphatase: 67 U/L (ref 38–126)
Anion gap: 9 (ref 5–15)
BUN: 14 mg/dL (ref 6–20)
CO2: 24 mmol/L (ref 22–32)
Calcium: 9.5 mg/dL (ref 8.9–10.3)
Chloride: 107 mmol/L (ref 98–111)
Creatinine, Ser: 0.68 mg/dL (ref 0.44–1.00)
GFR calc Af Amer: >60 mL/min (ref >60)
GFR calc non Af Amer: >60 mL/min (ref >60)
Glucose, Bld: 175 mg/dL — ABNORMAL HIGH (ref 70–99)
Potassium: 3.7 mmol/L (ref 3.5–5.1)
Sodium: 140 mmol/L (ref 135–145)
Total Bilirubin: 1 mg/dL (ref 0.3–1.2)
Total Protein: 7.8 g/dL (ref 6.5–8.1)

## 2019-12-01 LAB — URINALYSIS, COMPLETE (UACMP) WITH MICROSCOPIC
Bilirubin Urine: NEGATIVE
Glucose, UA: 50 mg/dL — AB
Hgb urine dipstick: NEGATIVE
Ketones, ur: 20 mg/dL — AB
Leukocytes,Ua: NEGATIVE
Nitrite: NEGATIVE
Protein, ur: NEGATIVE mg/dL
Specific Gravity, Urine: 1.019 (ref 1.005–1.030)
pH: 6 (ref 5.0–8.0)

## 2019-12-01 LAB — CBC
HCT: 37.8 % (ref 36.0–46.0)
Hemoglobin: 13.1 g/dL (ref 12.0–15.0)
MCH: 31.6 pg (ref 26.0–34.0)
MCHC: 34.7 g/dL (ref 30.0–36.0)
MCV: 91.1 fL (ref 80.0–100.0)
Platelets: 199 10*3/uL (ref 150–400)
RBC: 4.15 MIL/uL (ref 3.87–5.11)
RDW: 12.5 % (ref 11.5–15.5)
WBC: 8.9 10*3/uL (ref 4.0–10.5)
nRBC: 0 % (ref 0.0–0.2)

## 2019-12-01 LAB — LIPASE, BLOOD: Lipase: 42 U/L (ref 11–51)

## 2019-12-01 LAB — TROPONIN I (HIGH SENSITIVITY): Troponin I (High Sensitivity): <2 ng/L (ref <18)

## 2019-12-01 MED ORDER — SUCRALFATE 1 G PO TABS
1.0000 g | ORAL_TABLET | Freq: Once | ORAL | Status: AC
  Administered 2019-12-01: 1 g via ORAL
  Filled 2019-12-01: qty 1

## 2019-12-01 MED ORDER — ONDANSETRON 4 MG PO TBDP
4.0000 mg | ORAL_TABLET | Freq: Three times a day (TID) | ORAL | 0 refills | Status: DC | PRN
Start: 2019-12-01 — End: 2020-02-22

## 2019-12-01 MED ORDER — SUCRALFATE 1 G PO TABS
1.0000 g | ORAL_TABLET | Freq: Three times a day (TID) | ORAL | 0 refills | Status: DC
Start: 2019-12-01 — End: 2020-03-19

## 2019-12-01 MED ORDER — PANTOPRAZOLE SODIUM 20 MG PO TBEC
20.0000 mg | DELAYED_RELEASE_TABLET | Freq: Every day | ORAL | 0 refills | Status: DC
Start: 2019-12-01 — End: 2019-12-12

## 2019-12-01 MED ORDER — OXYCODONE-ACETAMINOPHEN 5-325 MG PO TABS
1.0000 | ORAL_TABLET | Freq: Once | ORAL | Status: AC
  Administered 2019-12-01: 1 via ORAL
  Filled 2019-12-01: qty 1

## 2019-12-01 MED ORDER — LIDOCAINE VISCOUS HCL 2 % MT SOLN
15.0000 mL | Freq: Once | OROMUCOSAL | Status: AC
  Administered 2019-12-01: 15 mL via ORAL
  Filled 2019-12-01: qty 15

## 2019-12-01 MED ORDER — ALUM & MAG HYDROXIDE-SIMETH 200-200-20 MG/5ML PO SUSP
30.0000 mL | Freq: Once | ORAL | Status: AC
  Administered 2019-12-01: 30 mL via ORAL
  Filled 2019-12-01: qty 30

## 2019-12-01 MED ORDER — ONDANSETRON 4 MG PO TBDP
4.0000 mg | ORAL_TABLET | Freq: Once | ORAL | Status: AC
  Administered 2019-12-01: 4 mg via ORAL
  Filled 2019-12-01: qty 1

## 2019-12-01 MED ORDER — PANTOPRAZOLE SODIUM 20 MG PO TBEC
20.0000 mg | DELAYED_RELEASE_TABLET | Freq: Once | ORAL | Status: DC
  Filled 2019-12-01: qty 1

## 2019-12-01 MED ORDER — OXYCODONE-ACETAMINOPHEN 5-325 MG PO TABS
1.0000 | ORAL_TABLET | ORAL | Status: DC | PRN
  Administered 2019-12-01: 1 via ORAL
  Filled 2019-12-01 (×2): qty 1

## 2019-12-01 NOTE — ED Provider Notes (Signed)
[facility] Emergency Department Provider Note  ____________________________________________   First MD Initiated Contact with Patient 12/01/19 2144     (approximate)  I have reviewed the triage vital signs and the nursing notes.   HISTORY  Chief Complaint Abdominal Pain    HPI [name] is a 54 y.o. female one week of burning sensation that is epigastric in nature.  Her pain was initially severe. Had her GB removed in March. Feels that the pain is similar.   Some nausea as well with it.  Last BM this morning. Has been taking ibuprofen for body aches for a week.  She states that she is taking 800 mg 1-2 times daily.  Patient states the pain is epigastric, burning, nothing makes it better, nothing makes it worse.  Pt states pain is better with zofran and percocet that was given in triage.            Past Medical History:  Diagnosis Date  . Allergy   . DDD (degenerative disc disease), lumbar   . Depression     Patient Active Problem List   Diagnosis Date Noted  . Radicular pain of shoulder (Left) 07/07/2019  . Neurogenic pain 06/23/2019  . Cervical foraminal stenosis (C5-6) (Left) 06/23/2019  . Cervical radiculopathy (C6) (Left) 06/23/2019  . Cervical disc disorder at C5-6 level w/ radiculopathy (Left) 06/23/2019  . Cervicalgia (Left) 06/23/2019  . Gastroesophageal reflux disease without esophagitis 11/15/2018  . Class 3 severe obesity without serious comorbidity with body mass index (BMI) of 40.0 to 44.9 in adult ([place]) 11/15/2018  . Chronic low back pain (Primary area of Pain) (Bilateral) (R>L) w/o sciatica 10/07/2018  . Chronic intermittent chest pain (Left) 10/07/2018  . Chronic rib pain (Fourth Area of Pain) (Left) 10/07/2018  . Chronic lower extremity pain (Secondary Area of Pain) (Bilateral) (R>L) 10/07/2018  . Chronic hip pain [facility] Area of Pain) (Bilateral) (R>L) 10/07/2018  . Chronic feet pain (Sixth Area of Pain)  (Bilateral) (R>L) 10/07/2018  . Chronic neck pain (Seventh Area of Pain) (Bilateral) (R>L) 10/07/2018  . Chronic upper back pain (Eighth Area of Pain) (Bilateral) (R>L) 10/07/2018  . DDD (degenerative disc disease), cervical 10/07/2018  . Chronic shoulder pain (Left) 10/07/2018  . Occipital headache (Bilateral) (R>L) 10/07/2018  . Cervicogenic headache (Bilateral) (R>L) 10/07/2018  . Disorder of skeletal system 10/07/2018  . Chronic pain syndrome 10/07/2018  . Lumbar facet arthropathy (Bilateral) 10/07/2018  . Lumbar facet syndrome (Bilateral) (R>L) 10/07/2018  . Grade 1 Anterolisthesis of L4/L5 (1 mm) 10/07/2018  . Chronic hand pain (Fifth Area of Pain) (Bilateral) (R>L) 10/07/2018  . Chronic wrist pain (Bilateral) (R>L) 10/07/2018  . Seasonal allergies 09/18/2016  . Arthralgia of multiple joints 06/02/2016  . Anxiety and depression 08/03/2014  . DDD (degenerative disc disease), lumbar 08/03/2014    Past Surgical History:  Procedure Laterality Date  . ABDOMINAL HYSTERECTOMY  2008   LAVH, endometriosis ablation, mccall culdoplasty  . CHOLECYSTECTOMY N/A 08/31/2019   Procedure: LAPAROSCOPIC CHOLECYSTECTOMY;  Surgeon: Olean Ree, MD;  Location: [facility] ORS;  Service: General;  Laterality: N/A;  . DIAGNOSTIC LAPAROSCOPY  2002   Hysteroscopy, diagnostic laparoscopy, extensive lysis of adhesions, endometrial ablation, RSO  . RIGHT OOPHORECTOMY  2007   with right salpingectomy  . TUBAL LIGATION      Prior to Admission medications   Medication Sig Start Date End Date Taking? Authorizing Provider  ALLEGRA-D ALLERGY & CONGESTION 180-240 MG 24 hr tablet TAKE 1 TABLET BY MOUTH EVERY DAY  AS NEEDED 09/22/19   Jearld Fenton, NP  diclofenac sodium (VOLTAREN) 1 % GEL Apply 2 g topically 4 (four) times daily. 04/08/19   Jearld Fenton, NP  DUEXIS 800-26.6 MG TABS Take 1 tablet by mouth 3 (three) times daily as needed. 11/11/19   Jearld Fenton, NP  famotidine (PEPCID) 20 MG tablet Take 1 tablet  (20 mg total) by mouth at bedtime. 10/18/19 10/17/20  Jearld Fenton, NP  fluticasone (FLONASE) 50 MCG/ACT nasal spray Place 2 sprays into both nostrils daily. 08/02/18   Jearld Fenton, NP  hydrOXYzine (ATARAX/VISTARIL) 25 MG tablet TAKE 1 TABLET (25 MG TOTAL) BY MOUTH DAILY AS NEEDED. 11/17/19   Jearld Fenton, NP  oxyCODONE (OXY IR/ROXICODONE) 5 MG immediate release tablet Take 1 tablet (5 mg total) by mouth every 6 (six) hours as needed for severe pain or breakthrough pain. 09/01/19   Tylene Fantasia, PA-C  traMADol (ULTRAM) 50 MG tablet Take 1 tablet (50 mg total) by mouth daily as needed. 11/25/19 12/25/19  Jearld Fenton, NP  Vitamin D, Ergocalciferol, (DRISDOL) 1.25 MG (50000 UNIT) CAPS capsule Take 1 capsule (50,000 Units total) by mouth every 7 (seven) days. 09/09/19   Jearld Fenton, NP    Allergies Orange concentrate [flavoring agent], Gabapentin, and Penicillins  Family History  Problem Relation Age of Onset  . Hyperlipidemia Mother   . Heart disease Maternal Aunt   . Stroke Maternal Aunt   . Diabetes Maternal Aunt   . Heart disease Maternal Uncle   . Stroke Maternal Uncle   . Cancer Neg Hx     Social History Social History   Tobacco Use  . Smoking status: Never Smoker  . Smokeless tobacco: Never Used  Vaping Use  . Vaping Use: Never used  Substance Use Topics  . Alcohol use: Yes    Comment: social  . Drug use: No      Review of Systems Constitutional: No fever/chills Eyes: No visual changes. ENT: No sore throat. Cardiovascular: Denies chest pain. Respiratory: Denies shortness of breath. Gastrointestinal: nausea, bloating, epigastric tenderness  Genitourinary: Negative for dysuria. Musculoskeletal: Negative for back pain. Skin: Negative for rash. Neurological: Negative for headaches, focal weakness or numbness. All other ROS negative ____________________________________________   PHYSICAL EXAM:  VITAL SIGNS: ED Triage Vitals  Enc Vitals Group     BP  12/01/19 1553 122/80     Pulse Rate 12/01/19 1553 93     Resp 12/01/19 1553 16     Temp 12/01/19 1553 (!) 97.5 F (36.4 C)     Temp Source 12/01/19 1553 Oral     SpO2 12/01/19 1553 100 %     Weight 12/01/19 1556 163 lb (73.9 kg)     Height 12/01/19 1556 5\' 5"  (1.651 m)     Head Circumference --      Peak Flow --      Pain Score 12/01/19 1556 10     Pain Loc --      Pain Edu? --      Excl. in [place]? --     Constitutional: Alert and oriented. Well appearing and in no acute distress. Eyes: Conjunctivae are normal. EOMI. Head: Atraumatic. Nose: No congestion/rhinnorhea. Mouth/Throat: Mucous membranes are moist.   Neck: No stridor. Trachea Midline. FROM Cardiovascular: Normal rate, regular rhythm. Grossly normal heart sounds.  Good peripheral circulation. Respiratory: Normal respiratory effort.  No retractions. Lungs CTAB. Gastrointestinal: Soft and nontender. No distention. No abdominal bruits.  Musculoskeletal: No  lower extremity tenderness nor edema.  No joint effusions. Neurologic:  Normal speech and language. No gross focal neurologic deficits are appreciated.  Skin:  Skin is warm, dry and intact. No rash noted. Psychiatric: Mood and affect are normal. Speech and behavior are normal. GU: Deferred   ____________________________________________   LABS (all labs ordered are listed, but only abnormal results are displayed)  Labs Reviewed  COMPREHENSIVE METABOLIC PANEL - Abnormal; Notable for the following components:      Result Value   Glucose, Bld 175 (*)    AST 69 (*)    All other components within normal limits  URINALYSIS, COMPLETE (UACMP) WITH MICROSCOPIC - Abnormal; Notable for the following components:   Color, Urine YELLOW (*)    APPearance HAZY (*)    Glucose, UA 50 (*)    Ketones, ur 20 (*)    Bacteria, UA RARE (*)    All other components within normal limits  LIPASE, BLOOD  CBC  TROPONIN I (HIGH SENSITIVITY)    ____________________________________________   ED ECG REPORT I, Vanessa [place], the attending physician, personally viewed and interpreted this ECG.  Normal sinus rate of 82, no st elevation, no twi, normal intervals.  ____________________________________________  RADIOLOGY   Official radiology report(s): US Abdomen Limited RUQ  Result Date: 12/01/2019 CLINICAL DATA:  Right upper quadrant pain EXAM: ULTRASOUND ABDOMEN LIMITED RIGHT UPPER QUADRANT COMPARISON:  10/03/2019 FINDINGS: Gallbladder: Prior cholecystectomy Common bile duct: Diameter: Normal caliber, 5 mm Liver: Increased echotexture compatible with fatty infiltration. No focal abnormality or biliary ductal dilatation. Portal vein is patent on color Doppler imaging with normal direction of blood flow towards the liver. Other: None. IMPRESSION: Fatty infiltration of the liver. No acute findings. Electronically Signed   By: Rolm Baptise M.D.   On: 12/01/2019 20:46    ____________________________________________   PROCEDURES  Procedure(s) performed (including Critical Care):  Procedures   ____________________________________________   INITIAL IMPRESSION / ASSESSMENT AND PLAN / ED COURSE  Kendi Defalco was evaluated in Emergency Department on 12/01/2019 for the symptoms described in the history of present illness. She was evaluated in the context of the global COVID-19 pandemic, which necessitated consideration that the patient might be at risk for infection with the SARS-CoV-2 virus that causes COVID-19. Institutional protocols and algorithms that pertain to the evaluation of patients at risk for COVID-19 are in a state of rapid change based on information released by regulatory bodies including the CDC and federal and state organizations. These policies and algorithms were followed during the patient's care in the ED.    Patient is a well-appearing 54 year old with ibuprofen use who comes in with epigastric tenderness.   Patient already had her gallbladder removed.  Ultrasound ordered in triage does not show any biliary ductal dilation.  She does have fatty liver.  There is no LFT elevation or lipase elevation to suggest retained stone.  She denies any lower abdominal pain to suggest appendicitis, diverticulitis.  Her abdomen is soft and nontender I have low suspicion for perforation.  Currently her pain is only a 2 out of 10.  I have lower suspicion for ACS but given her age cardiac marker was obtained that was negative and her symptoms have been going on for greater than 3 hours.  I suspect this is most likely gastritis specially from her ibuprofen use.  We discussed symptomatic management with switching the ibuprofen to Tylenol and taking PPI, Carafate, Zofran.  Upon discharge patient's pain was turned to keep up a little  bit I gave her GI cocktail and her pain again resolved.  Patient given GIs number for follow-up of her symptoms or not resolving with the above treatments.  I discussed the provisional nature of ED diagnosis, the treatment so far, the ongoing plan of care, follow up appointments and return precautions with the patient and any family or support people present. They expressed understanding and agreed with the plan, discharged home.    ____________________________________________   FINAL CLINICAL IMPRESSION(S) / ED DIAGNOSES   Final diagnoses:  Gastritis without bleeding, unspecified chronicity, unspecified gastritis type      MEDICATIONS GIVEN DURING THIS VISIT:  Medications  ondansetron (ZOFRAN-ODT) disintegrating tablet 4 mg (4 mg Oral Given 12/01/19 2004)  oxyCODONE-acetaminophen (PERCOCET/ROXICET) 5-325 MG per tablet 1 tablet (1 tablet Oral Given 12/01/19 2006)  alum & mag hydroxide-simeth (MAALOX/MYLANTA) 200-200-20 MG/5ML suspension 30 mL (30 mLs Oral Given 12/01/19 2342)    And  lidocaine (XYLOCAINE) 2 % viscous mouth solution 15 mL (15 mLs Oral Given 12/01/19 2342)  sucralfate  (CARAFATE) tablet 1 g (1 g Oral Given 12/01/19 2346)     ED Discharge Orders         Ordered    pantoprazole (PROTONIX) 20 MG tablet  Daily     Discontinue  Reprint     12/01/19 2327    sucralfate (CARAFATE) 1 g tablet  3 times daily with meals & bedtime     Discontinue  Reprint     12/01/19 2327    ondansetron (ZOFRAN ODT) 4 MG disintegrating tablet  Every 8 hours PRN     Discontinue  Reprint     12/01/19 2327           Note:  This document was prepared using Dragon voice recognition software and may include unintentional dictation errors.   Vanessa Cardwell, MD 12/02/19 [phone]

## 2019-12-01 NOTE — Telephone Encounter (Signed)
Pt said she had cholecystectomy 08/2019; pt said starting on 11/30/19 pt had dull, mid upper abd pain that has been on and off since started. Pt has burning when eats and bloating. Pt said she felt like she did before she had GB surgery.pt does not have access to thermometer but pt said she does not feel warm to touch. Last night pt ate bland food and took famotidine and that seemed to help. This morning after eating a [place] sausage bagel pt started with upper abd pain that has been constant today; at lunch pt ate PB & J and pt continues with dull constant pain at pain level of 7-8; pt feels very nauseated like she is going to vomit but has not vomited yet. Pt is at work but waiting on ride to pick her up now; no available appt at [facility] and pt is going to [facility] on [address] when her ride gets there. FYI to Avie Echevaria NP.

## 2019-12-01 NOTE — Telephone Encounter (Signed)
Noted, agree with advice given 

## 2019-12-01 NOTE — ED Triage Notes (Signed)
Pt had gallbladder removed in march and yesterday starting having the same pain she had before her gallbladder was removed. RUQ pain sharp and constant. Tearful and moaning in triage.

## 2019-12-01 NOTE — ED Notes (Signed)
Dr. Funke at bedside. 

## 2019-12-01 NOTE — ED Notes (Addendum)
See triage note, pt reports mid abdominal pain that started last week. Nausea and vomiting x1 that started today with dizziness. Denies fever. Reports abdominal pain worsens after eating.  Last BM today.  Denies CP and SHOB

## 2019-12-01 NOTE — Discharge Instructions (Addendum)
You should stop taking the ibuprofen because this could be causing you to have inflammation of your stomach.  You should take Tylenol 1 g every 8 hours to help with any pain.  Take the Protonix to help decrease acid the Carafate to help line your stomach and the Zofran to help with nausea.  Return to ER if you develop lower abdominal pain, fevers or any other concerns.  There are no signs of heart attack.  You can follow-up with a GI doctor if your symptoms are not getting better to discuss endoscopy.

## 2019-12-06 ENCOUNTER — Encounter: Payer: Self-pay | Admitting: Internal Medicine

## 2019-12-12 ENCOUNTER — Other Ambulatory Visit: Payer: Self-pay

## 2019-12-12 ENCOUNTER — Encounter: Payer: Self-pay | Admitting: Internal Medicine

## 2019-12-12 ENCOUNTER — Ambulatory Visit (INDEPENDENT_AMBULATORY_CARE_PROVIDER_SITE_OTHER): Payer: 59 | Admitting: Internal Medicine

## 2019-12-12 VITALS — BP 124/78 | HR 100 | Temp 97.9°F | Wt 244.0 lb

## 2019-12-12 DIAGNOSIS — T39395A Adverse effect of other nonsteroidal anti-inflammatory drugs [NSAID], initial encounter: Secondary | ICD-10-CM

## 2019-12-12 DIAGNOSIS — E559 Vitamin D deficiency, unspecified: Secondary | ICD-10-CM

## 2019-12-12 DIAGNOSIS — K2960 Other gastritis without bleeding: Secondary | ICD-10-CM | POA: Diagnosis not present

## 2019-12-12 MED ORDER — PANTOPRAZOLE SODIUM 40 MG PO TBEC
40.0000 mg | DELAYED_RELEASE_TABLET | Freq: Every day | ORAL | 0 refills | Status: DC
Start: 2019-12-12 — End: 2020-01-03

## 2019-12-12 NOTE — Progress Notes (Signed)
Subjective:    Patient ID: [name], female    DOB: [birth year], 54 y.o.   MRN: [id number]  HPI  Pt presents to the clinic today for ER follow up. She went to the ER 6/17 with c/o epigastric pain and nausea. Ultrasound of the abdomen showed fatty liver, otherwise unremarkable. Labs were unremarkable. They felt this was NSAID induced gastritis. She was given GI cocktail, RX for Zofran, Pantoprazole and Carafate. She was discharged and advised to follow up with her PCP. Since discharge, she reports her pain has improved. She has some slight epigastric pain today but denies nausea, vomiting, diarrhea, constipation or blood in her stool.  She is also due to repeat Vit D since finishing 12 weeks of Ergocalciferol.  Review of Systems      Past Medical History:  Diagnosis Date  . Allergy   . DDD (degenerative disc disease), lumbar   . Depression     Current Outpatient Medications  Medication Sig Dispense Refill  . ALLEGRA-D ALLERGY & CONGESTION 180-240 MG 24 hr tablet TAKE 1 TABLET BY MOUTH EVERY DAY AS NEEDED 30 tablet 1  . diclofenac sodium (VOLTAREN) 1 % GEL Apply 2 g topically 4 (four) times daily. 100 g 0  . DUEXIS 800-26.6 MG TABS Take 1 tablet by mouth 3 (three) times daily as needed. 90 tablet 1  . famotidine (PEPCID) 20 MG tablet Take 1 tablet (20 mg total) by mouth at bedtime. 90 tablet 1  . fluticasone (FLONASE) 50 MCG/ACT nasal spray Place 2 sprays into both nostrils daily. 16 g 6  . hydrOXYzine (ATARAX/VISTARIL) 25 MG tablet TAKE 1 TABLET (25 MG TOTAL) BY MOUTH DAILY AS NEEDED. 90 tablet 1  . ondansetron (ZOFRAN ODT) 4 MG disintegrating tablet Take 1 tablet (4 mg total) by mouth every 8 (eight) hours as needed for nausea or vomiting. 20 tablet 0  . oxyCODONE (OXY IR/ROXICODONE) 5 MG immediate release tablet Take 1 tablet (5 mg total) by mouth every 6 (six) hours as needed for severe pain or breakthrough pain. 20 tablet 0  . pantoprazole (PROTONIX) 20 MG tablet Take 1  tablet (20 mg total) by mouth daily. 30 tablet 0  . sucralfate (CARAFATE) 1 g tablet Take 1 tablet (1 g total) by mouth 4 (four) times daily -  with meals and at bedtime. 120 tablet 0  . traMADol (ULTRAM) 50 MG tablet Take 1 tablet (50 mg total) by mouth daily as needed. 30 tablet 0  . Vitamin D, Ergocalciferol, (DRISDOL) 1.25 MG (50000 UNIT) CAPS capsule Take 1 capsule (50,000 Units total) by mouth every 7 (seven) days. 12 capsule 0   No current facility-administered medications for this visit.    Allergies  Allergen Reactions  . Orange Concentrate [Flavoring Agent] Rash    Rash on right side of body  . Gabapentin Other (See Comments)    Hair loss  . Penicillins Other (See Comments)    Childhood allergy Has patient had a PCN reaction causing immediate rash, facial/tongue/throat swelling, SOB or lightheadedness with hypotension: Unknown Has patient had a PCN reaction causing severe rash involving mucus membranes or skin necrosis: Unknown Has patient had a PCN reaction that required hospitalization: No  Has patient had a PCN reaction occurring within the last 10 years: No  If all of the above answers are "NO", then may proceed with Cephalosporin use.     Family History  Problem Relation Age of Onset  . Hyperlipidemia Mother   . Heart disease Maternal  Aunt   . Stroke Maternal Aunt   . Diabetes Maternal Aunt   . Heart disease Maternal Uncle   . Stroke Maternal Uncle   . Cancer Neg Hx     Social History   Socioeconomic History  . Marital status: Married    Spouse name: Not on file  . Number of children: Not on file  . Years of education: Not on file  . Highest education level: Not on file  Occupational History  . Not on file  Tobacco Use  . Smoking status: Never Smoker  . Smokeless tobacco: Never Used  Vaping Use  . Vaping Use: Never used  Substance and Sexual Activity  . Alcohol use: Yes    Comment: social  . Drug use: No  . Sexual activity: Yes    Partners: Male      Birth control/protection: Post-menopausal, Surgical  Other Topics Concern  . Not on file  Social History Narrative  . Not on file   Social Determinants of Health   Financial Resource Strain:   . Difficulty of Paying Living Expenses:   Food Insecurity:   . Worried About Charity fundraiser in the Last Year:   . Arboriculturist in the Last Year:   Transportation Needs:   . Film/video editor (Medical):   [name] Lack of Transportation (Non-Medical):   Physical Activity:   . Days of Exercise per Week:   . Minutes of Exercise per Session:   Stress:   . Feeling of Stress :   Social Connections:   . Frequency of Communication with Friends and Family:   . Frequency of Social Gatherings with Friends and Family:   . Attends Religious Services:   . Active Member of Clubs or Organizations:   . Attends Archivist Meetings:   [name] Marital Status:   Intimate Partner Violence:   . Fear of Current or Ex-Partner:   . Emotionally Abused:   [name] Physically Abused:   . Sexually Abused:      Constitutional: Denies fever, malaise, fatigue, headache or abrupt weight changes.  Respiratory: Denies difficulty breathing, shortness of breath, cough or sputum production.   Cardiovascular: Denies chest pain, chest tightness, palpitations or swelling in the hands or feet.  Gastrointestinal: Pt reports intermittent epigastric pain. Denies bloating, constipation, diarrhea or blood in the stool.  GU: Denies urgency, frequency, pain with urination, burning sensation, blood in urine, odor or discharge.   No other specific complaints in a complete review of systems (except as listed in HPI above).  Objective:   Physical Exam  BP 124/78   Pulse 100   Temp 97.9 F (36.6 C) (Temporal)   Wt 244 lb (110.7 kg)   SpO2 98%   BMI 40.60 kg/m   Wt Readings from Last 3 Encounters:  12/01/19 163 lb (73.9 kg)  09/30/19 254 lb (115.2 kg)  09/28/19 255 lb 6.4 oz (115.8 kg)    General: Appears her  stated age, obese, in NAD. Cardiovascular: Normal rate and rhythm. S1,S2 noted.  No murmur, rubs or gallops noted.  Pulmonary/Chest: Normal effort and positive vesicular breath sounds. No respiratory distress. No wheezes, rales or ronchi noted.  Abdomen: Soft and nontender. Normal bowel sounds. No distention or masses noted.  Neurological: Alert and oriented.    BMET    Component Value Date/Time   NA 140 12/01/2019 1558   NA 142 09/18/2016 0929   K 3.7 12/01/2019 1558   CL 107 12/01/2019 1558  CO2 24 12/01/2019 1558   GLUCOSE 175 (H) 12/01/2019 1558   BUN 14 12/01/2019 1558   BUN 9 09/18/2016 0929   CREATININE 0.68 12/01/2019 1558   CALCIUM 9.5 12/01/2019 1558   GFRNONAA >60 12/01/2019 1558   GFRAA >60 12/01/2019 1558    Lipid Panel     Component Value Date/Time   CHOL 186 02/25/2019 1005   CHOL 200 (H) 03/23/2017 0844   TRIG 96.0 02/25/2019 1005   HDL 52.40 02/25/2019 1005   HDL 57 03/23/2017 0844   CHOLHDL 4 02/25/2019 1005   VLDL 19.2 02/25/2019 1005   LDLCALC 115 (H) 02/25/2019 1005   LDLCALC 124 (H) 03/23/2017 0844    CBC    Component Value Date/Time   WBC 8.9 12/01/2019 1558   RBC 4.15 12/01/2019 1558   HGB 13.1 12/01/2019 1558   HGB 13.2 09/18/2016 0929   HCT 37.8 12/01/2019 1558   HCT 38.1 09/18/2016 0929   PLT 199 12/01/2019 1558   PLT 268 09/18/2016 0929   MCV 91.1 12/01/2019 1558   MCV 94 09/18/2016 0929   MCH 31.6 12/01/2019 1558   MCHC 34.7 12/01/2019 1558   RDW 12.5 12/01/2019 1558   RDW 12.8 09/18/2016 0929   LYMPHSABS 2.8 08/30/2019 0315   LYMPHSABS 2.8 08/03/2014 1609   MONOABS 0.6 08/30/2019 0315   EOSABS 0.1 08/30/2019 0315   EOSABS 0.1 08/03/2014 1609   BASOSABS 0.0 08/30/2019 0315   BASOSABS 0.0 08/03/2014 1609    Hgb A1C Lab Results  Component Value Date   HGBA1C 5.9 09/09/2019           Assessment & Plan:    ER Follow Up for NSAID Induced Gastritis:  ER notes, labs and imaging reviewed Advised Tylenol instead of  Ibuprofen Increase Pantoprazole to 40 mg daily Continue Carafate Referral to GI for further eval, possible upper GI  Vit D Deficiecny:  Vit D today  Return precautions discussed This visit occurred during the SARS-CoV-2 public health emergency.  Safety protocols were in place, including screening questions prior to the visit, additional usage of staff PPE, and extensive cleaning of exam room while observing appropriate contact time as indicated for disinfecting solutions.

## 2019-12-12 NOTE — Patient Instructions (Signed)
Gastritis, Adult  Gastritis is swelling (inflammation) of the stomach. Gastritis can develop quickly (acute). It can also develop slowly over time (chronic). It is important to get help for this condition. If you do not get help, your stomach can bleed, and you can get sores (ulcers) in your stomach. What are the causes? This condition may be caused by:  Germs that get to your stomach.  Drinking too much alcohol.  Medicines you are taking.  Too much acid in the stomach.  A disease of the intestines or stomach.  Stress.  An allergic reaction.  Crohn's disease.  Some cancer treatments (radiation). Sometimes the cause of this condition is not known. What are the signs or symptoms? Symptoms of this condition include:  Pain in your stomach.  A burning feeling in your stomach.  Feeling sick to your stomach (nauseous).  Throwing up (vomiting).  Feeling too full after you eat.  Weight loss.  Bad breath.  Throwing up blood.  Blood in your poop (stool). How is this diagnosed? This condition may be diagnosed with:  Your medical history and symptoms.  A physical exam.  Tests. These can include: ? Blood tests. ? Stool tests. ? A procedure to look inside your stomach (upper endoscopy). ? A test in which a sample of tissue is taken for testing (biopsy). How is this treated? Treatment for this condition depends on what caused it. You may be given:  Antibiotic medicine, if your condition was caused by germs.  H2 blockers and similar medicines, if your condition was caused by too much acid. Follow these instructions at home: Medicines  Take over-the-counter and prescription medicines only as told by your doctor.  If you were prescribed an antibiotic medicine, take it as told by your doctor. Do not stop taking it even if you start to feel better. Eating and drinking   Eat small meals often, instead of large meals.  Avoid foods and drinks that make your symptoms  worse.  Drink enough fluid to keep your pee (urine) pale yellow. Alcohol use  Do not drink alcohol if: ? Your doctor tells you not to drink. ? You are pregnant, may be pregnant, or are planning to become pregnant.  If you drink alcohol: ? Limit your use to:  0-1 drink a day for women.  0-2 drinks a day for men. ? Be aware of how much alcohol is in your drink. In the [place] one drink equals one 12 oz bottle of beer (355 mL), one 5 oz glass of wine (148 mL), or one 1 oz glass of hard liquor (44 mL). General instructions  Talk with your doctor about ways to manage stress. You can exercise or do deep breathing, meditation, or yoga.  Do not smoke or use products that have nicotine or tobacco. If you need help quitting, ask your doctor.  Keep all follow-up visits as told by your doctor. This is important. Contact a doctor if:  Your symptoms get worse.  Your symptoms go away and then come back. Get help right away if:  You throw up blood or something that looks like coffee grounds.  You have black or dark red poop.  You throw up any time you try to drink fluids.  Your stomach pain gets worse.  You have a fever.  You do not feel better after one week. Summary  Gastritis is swelling (inflammation) of the stomach.  You must get help for this condition. If you do not get help, your stomach   can bleed, and you can get sores (ulcers).  This condition is diagnosed with medical history, physical exam, or tests.  You can be treated with medicines for germs or medicines to block too much acid in your stomach. This information is not intended to replace advice given to you by your health care provider. Make sure you discuss any questions you have with your health care provider. Document Revised: 10/20/2017 Document Reviewed: 10/20/2017 Elsevier Patient Education  2020 [employer]  

## 2019-12-13 LAB — VITAMIN D 25 HYDROXY (VIT D DEFICIENCY, FRACTURES): VITD: 48.42 ng/mL (ref 30.00–100.00)

## 2019-12-14 ENCOUNTER — Encounter: Payer: Self-pay | Admitting: Gastroenterology

## 2019-12-29 ENCOUNTER — Encounter: Payer: Self-pay | Admitting: Internal Medicine

## 2020-01-02 ENCOUNTER — Encounter: Payer: Self-pay | Admitting: Internal Medicine

## 2020-01-03 ENCOUNTER — Other Ambulatory Visit: Payer: Self-pay | Admitting: Internal Medicine

## 2020-01-03 MED ORDER — PANTOPRAZOLE SODIUM 40 MG PO TBEC: 40.0000 mg | DELAYED_RELEASE_TABLET | Freq: Every day | ORAL | 0 refills | Status: DC

## 2020-01-06 ENCOUNTER — Encounter: Payer: Self-pay | Admitting: Internal Medicine

## 2020-01-06 NOTE — Telephone Encounter (Signed)
Not increasing the Tramadol. She can take Tylenol [phone] mg every 8 horus

## 2020-01-09 MED ORDER — TRAMADOL HCL 50 MG PO TABS: 50.0000 mg | ORAL_TABLET | Freq: Every day | ORAL | 0 refills | Status: AC | PRN

## 2020-01-30 ENCOUNTER — Other Ambulatory Visit: Payer: Self-pay | Admitting: Internal Medicine

## 2020-01-30 MED ORDER — HYDROXYZINE HCL 25 MG PO TABS: 25.0000 mg | ORAL_TABLET | Freq: Every day | ORAL | 1 refills | Status: DC | PRN

## 2020-01-30 MED ORDER — CYCLOBENZAPRINE HCL 10 MG PO TABS
10.0000 mg | ORAL_TABLET | Freq: Three times a day (TID) | ORAL | 0 refills | Status: DC | PRN
Start: 2020-01-30 — End: 2020-12-28

## 2020-01-30 MED ORDER — FAMOTIDINE 20 MG PO TABS
20.0000 mg | ORAL_TABLET | Freq: Every day | ORAL | 2 refills | Status: DC
Start: 2020-01-30 — End: 2020-03-19

## 2020-01-30 NOTE — Addendum Note (Signed)
Addended by: Lurlean Nanny on: 01/30/2020 07:54 AM   Modules accepted: Orders

## 2020-01-31 ENCOUNTER — Ambulatory Visit: Payer: 59 | Admitting: Obstetrics and Gynecology

## 2020-02-07 ENCOUNTER — Ambulatory Visit: Payer: 59 | Admitting: Obstetrics and Gynecology

## 2020-02-10 ENCOUNTER — Encounter: Payer: Self-pay | Admitting: Gastroenterology

## 2020-02-10 ENCOUNTER — Ambulatory Visit (INDEPENDENT_AMBULATORY_CARE_PROVIDER_SITE_OTHER): Payer: 59 | Admitting: Gastroenterology

## 2020-02-10 VITALS — BP 134/80 | HR 88 | Ht 65.5 in | Wt 238.0 lb

## 2020-02-10 DIAGNOSIS — R109 Unspecified abdominal pain: Secondary | ICD-10-CM

## 2020-02-10 DIAGNOSIS — R1310 Dysphagia, unspecified: Secondary | ICD-10-CM | POA: Diagnosis not present

## 2020-02-10 MED ORDER — SUTAB 1479-225-188 MG PO TABS: ORAL_TABLET | ORAL | 0 refills | Status: DC

## 2020-02-10 NOTE — Patient Instructions (Addendum)
If you are age 54 or older, your body mass index should be between 23-30. Your Body mass index is 39 kg/m. If this is out of the aforementioned range listed, please consider follow up with your Primary Care Provider.  If you are age 74 or younger, your body mass index should be between 19-25. Your Body mass index is 39 kg/m. If this is out of the aformentioned range listed, please consider follow up with your Primary Care Provider.   You have been scheduled for an endoscopy and colonoscopy. Please follow the written instructions given to you at your visit today. Please pick up your prep supplies at the pharmacy within the next 1-3 days. If you use inhalers (even only as needed), please bring them with you on the day of your procedure.  Tips for colonoscopy:  - Stay well hydrated for 3-4 days prior to the exam. This reduces nausea and dehydration.  - To prevent skin/hemorrhoid irritation - prior to wiping, put A&Dointment or vaseline on the toilet paper. - Keep a towel or pad on the bed.  - Drink  64oz of clear liquids in the morning of prep day (prior to starting the prep) to be sure that there is enough fluid to flush the colon and stay hydrated!!!! This is in addition to the fluids required for preparation. - Use of a flavored hard candy, such as grape Anise Salvo, can counteract some of the flavor of the prep and may prevent some nausea.   Thank you for trusting me with your gastrointestinal care!    Thornton Park, MD, MPH

## 2020-02-10 NOTE — Progress Notes (Signed)
Referring Provider: Jearld Fenton, NP Primary Care Physician:  Jearld Fenton, NP  Reason for Consultation:  Abdominal pain   IMPRESSION:  Abdominal pain requiring ED visit, not improved following cholecystectomy Dysphagia in the setting of well-controlled GERD 40 pound intentional weight loss Lap cholecystectomy 08/30/19 for acute and chronic cholecystitis with cholelithiasis No prior colon cancer screening Distant endoscopic evaluation at [facility] for GI symptoms  Abdominal pain: Not improved after cholecystectomy. No obvious source on CT of multiple ultrasounds. Given concurrent dysphagia, must consider esophagitis, as well as H pylori, gastritis, PUD, GOO, and malignancy. Will increase PPI to BID. EGD with biopsies recommended.  Dysphagia: Suspected esophagitis - reflux or EOE. Differential also includes ring, web, stricture, Zenker's.   No prior colon cancer screening: Colonoscopy recommended.   PLAN: Increase pantroprazole 40 mg BID Obtain prior records from [facility] to evaluate abdominal pain Colonoscopy for colon cancer screening  Please see the "Patient Instructions" section for addition details about the plan.  HPI: [name] is a 54 y.o. female referred by NP [facility] for abdominal pain. The history is obtained through the patient and review of her electronic health record. She has gallstones, GERD, hypercholesterolemia, obesity, and prior hysterectomy. Her daughter, [name], is my patient and recently found out that she is pregnant. 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 She had the Covid vaccine.   Presented to the ED for epigastric abdominal pain with associated bloating, nausea, and vomiting.  CT abd/pelvis with contrast 08/20/19: mildly distended gallbladder with suggestion of mild wall thickening, hepatic steatosis, rectosigmoid diverticulosis. Liver enzymes normal.  Lap cholecystectomy 08/30/19. Pathology showed acute and chronic  cholecystitis with cholelithiasis.  Onggoing abdominal pain following surgery.   Abdominal ultrasound x 4 following the CT with the most recent 12/01/19:  fatty liver without acute findings. Surgically absent gallbladder. Small cystic area in the gallbladder fossa thought to be postoperative changes.    ED visit for abdominal pain 12/01/19 resulted in clinical diagnosis of NSAID induced gastritis. Started pantoprazole and Carafate. Symptoms have improved and she continue to take the pantoprazole 40 mg daily. Uses Tylenol for the pain and famotidine PRN. No NSAIDs since June visit to the ED.   Reports intermittent dysphagia to meats and occasionally pasta. Heartburn controlled on pantoprazole. Feels like it hangs up at the sternal notch. Will eat slowly, try to burp, or take a sip of water. No sore throat, neck pain, dysphonia, or odynophagia.   She has lost 40 pounds since she stopped eating sugar or fried foods.   Colonoscopy prior to 2010 at [facility] to evaluate GI symptoms. She does not remember the results.    No known family history of colon cancer or polyps. No family history of uterine/endometrial cancer, pancreatic cancer or gastric/stomach cancer.   Past Medical History:  Diagnosis Date  . Allergy   . DDD (degenerative disc disease), lumbar   . Depression     Past Surgical History:  Procedure Laterality Date  . ABDOMINAL HYSTERECTOMY  2008   LAVH, endometriosis ablation, mccall culdoplasty  . CHOLECYSTECTOMY N/A 08/31/2019   Procedure: LAPAROSCOPIC CHOLECYSTECTOMY;  Surgeon: Olean Ree, MD;  Location: [facility] ORS;  Service: General;  Laterality: N/A;  . DIAGNOSTIC LAPAROSCOPY  2002   Hysteroscopy, diagnostic laparoscopy, extensive lysis of adhesions, endometrial ablation, RSO  . RIGHT OOPHORECTOMY  2007   with right salpingectomy  . TUBAL LIGATION      Current Outpatient Medications  Medication Sig Dispense  Refill  . ALLEGRA-D ALLERGY & CONGESTION 180-240 MG 24 hr tablet  TAKE 1 TABLET BY MOUTH EVERY DAY AS NEEDED 30 tablet 1  . cyclobenzaprine (FLEXERIL) 10 MG tablet Take 1 tablet (10 mg total) by mouth 3 (three) times daily as needed for muscle spasms. 30 tablet 0  . diclofenac sodium (VOLTAREN) 1 % GEL Apply 2 g topically 4 (four) times daily. 100 g 0  . famotidine (PEPCID) 20 MG tablet Take 1 tablet (20 mg total) by mouth daily. 30 tablet 2  . fluticasone (FLONASE) 50 MCG/ACT nasal spray Place 2 sprays into both nostrils daily. 16 g 6  . hydrOXYzine (ATARAX/VISTARIL) 25 MG tablet Take 1 tablet (25 mg total) by mouth daily as needed. 90 tablet 1  . ondansetron (ZOFRAN ODT) 4 MG disintegrating tablet Take 1 tablet (4 mg total) by mouth every 8 (eight) hours as needed for nausea or vomiting. 20 tablet 0  . pantoprazole (PROTONIX) 40 MG tablet Take 1 tablet (40 mg total) by mouth daily. 90 tablet 0  . sucralfate (CARAFATE) 1 g tablet Take 1 tablet (1 g total) by mouth 4 (four) times daily -  with meals and at bedtime. 120 tablet 0   No current facility-administered medications for this visit.    Allergies as of 02/10/2020 - Review Complete 02/10/2020  Allergen Reaction Noted  . Orange concentrate [employer] agent] Rash 03/11/2016  . Gabapentin Other (See Comments) 09/28/2014  . Penicillins Other (See Comments) 02/27/2012    Family History  Problem Relation Age of Onset  . Hyperlipidemia Mother   . Heart disease Maternal Aunt   . Stroke Maternal Aunt   . Diabetes Maternal Aunt   . Heart disease Maternal Uncle   . Stroke Maternal Uncle   . Cancer Neg Hx     Social History   Socioeconomic History  . Marital status: Married    Spouse name: Not on file  . Number of children: Not on file  . Years of education: Not on file  . Highest education level: Not on file  Occupational History  . Not on file  Tobacco Use  . Smoking status: Never Smoker  . Smokeless tobacco: Never Used  Vaping Use  . Vaping Use: Never used  Substance and Sexual Activity    . Alcohol use: Yes    Comment: social  . Drug use: No  . Sexual activity: Yes    Partners: Male    Birth control/protection: Post-menopausal, Surgical  Other Topics Concern  . Not on file  Social History Narrative  . Not on file   Social Determinants of Health   Financial Resource Strain:   . Difficulty of Paying Living Expenses: Not on file  Food Insecurity:   . Worried About Charity fundraiser in the Last Year: Not on file  . Ran Out of Food in the Last Year: Not on file  Transportation Needs:   . Lack of Transportation (Medical): Not on file  . Lack of Transportation (Non-Medical): Not on file  Physical Activity:   . Days of Exercise per Week: Not on file  . Minutes of Exercise per Session: Not on file  Stress:   . Feeling of Stress : Not on file  Social Connections:   . Frequency of Communication with Friends and Family: Not on file  . Frequency of Social Gatherings with Friends and Family: Not on file  . Attends Religious Services: Not on file  . Active Member of Clubs or Organizations: Not  on file  . Attends Archivist Meetings: Not on file  . Marital Status: Not on file  Intimate Partner Violence:   . Fear of Current or Ex-Partner: Not on file  . Emotionally Abused: Not on file  . Physically Abused: Not on file  . Sexually Abused: Not on file    Review of Systems: 12 system ROS is negative except as noted above with the additions of allergies, arthritis, back pain, fatigue, night sweats, insomnia, and urine leakage.   Physical Exam: General:   Alert,  well-nourished, pleasant and cooperative in NAD Head:  Normocephalic and atraumatic. Eyes:  Sclera clear, no icterus.   Conjunctiva pink. Ears:  Normal auditory acuity. Nose:  No deformity, discharge,  or lesions. Mouth:  No deformity or lesions.   Neck:  Supple; no masses or thyromegaly. Lungs:  Clear throughout to auscultation.   No wheezes. Heart:  Regular rate and rhythm; no murmurs. Abdomen:   Soft, nontender, nondistended, normal bowel sounds, no rebound or guarding. No hepatosplenomegaly.   Rectal:  Deferred  Msk:  Symmetrical. No boney deformities LAD: No inguinal or umbilical LAD Extremities:  No clubbing or edema. Neurologic:  Alert and  oriented x4;  grossly nonfocal Skin:  Intact without significant lesions or rashes. Psych:  Alert and cooperative. Normal mood and affect.     Trianna Lupien L. Tarri Glenn, MD, MPH 02/10/2020, 10:29 AM

## 2020-02-16 ENCOUNTER — Telehealth: Payer: Self-pay | Admitting: Gastroenterology

## 2020-02-16 ENCOUNTER — Other Ambulatory Visit: Payer: Self-pay

## 2020-02-16 MED ORDER — PANTOPRAZOLE SODIUM 40 MG PO TBEC: 40.0000 mg | DELAYED_RELEASE_TABLET | Freq: Two times a day (BID) | ORAL | 3 refills | Status: DC

## 2020-02-16 NOTE — Telephone Encounter (Signed)
Pt states that Dr. Tarri Glenn was going to increase dose of Pantoprazole and sent prescription to her pharmacy but her pharmacy has not received anything yet. Pt states that she currently takes Pantoprazole 40 mg 1/day but it was supposed to be increased by Dr. Tarri Glenn. Pt uses [facility] on [address] in [place].

## 2020-02-16 NOTE — Telephone Encounter (Signed)
New Rx has been sent.  Tried to verify with the patient, no answer and cant leave a voicemail. Rx has been re-sent to the requesting pharmacy.

## 2020-02-17 ENCOUNTER — Other Ambulatory Visit: Payer: Self-pay | Admitting: Internal Medicine

## 2020-02-18 ENCOUNTER — Other Ambulatory Visit: Payer: Self-pay | Admitting: Internal Medicine

## 2020-02-22 ENCOUNTER — Other Ambulatory Visit: Payer: Self-pay

## 2020-02-22 ENCOUNTER — Other Ambulatory Visit: Payer: Self-pay | Admitting: Internal Medicine

## 2020-02-22 MED ORDER — ONDANSETRON 4 MG PO TBDP: 4.0000 mg | ORAL_TABLET | Freq: Three times a day (TID) | ORAL | 0 refills | Status: DC | PRN

## 2020-02-27 ENCOUNTER — Encounter: Payer: Self-pay | Admitting: Internal Medicine

## 2020-02-27 DIAGNOSIS — E559 Vitamin D deficiency, unspecified: Secondary | ICD-10-CM

## 2020-02-28 ENCOUNTER — Encounter: Payer: Self-pay | Admitting: Internal Medicine

## 2020-02-29 MED ORDER — ALBUTEROL SULFATE HFA 108 (90 BASE) MCG/ACT IN AERS
2.0000 | INHALATION_SPRAY | Freq: Four times a day (QID) | RESPIRATORY_TRACT | 0 refills | Status: DC | PRN
Start: 2020-02-29 — End: 2021-03-28

## 2020-02-29 NOTE — Addendum Note (Signed)
Addended by: Jearld Fenton on: 02/29/2020 02:12 PM   Modules accepted: Orders

## 2020-03-19 ENCOUNTER — Ambulatory Visit (INDEPENDENT_AMBULATORY_CARE_PROVIDER_SITE_OTHER): Payer: 59 | Admitting: Internal Medicine

## 2020-03-19 ENCOUNTER — Other Ambulatory Visit: Payer: Self-pay

## 2020-03-19 ENCOUNTER — Encounter: Payer: Self-pay | Admitting: Internal Medicine

## 2020-03-19 VITALS — BP 122/78 | HR 93 | Temp 98.0°F | Ht 65.0 in | Wt 240.0 lb

## 2020-03-19 DIAGNOSIS — F419 Anxiety disorder, unspecified: Secondary | ICD-10-CM

## 2020-03-19 DIAGNOSIS — Z23 Encounter for immunization: Secondary | ICD-10-CM | POA: Diagnosis not present

## 2020-03-19 DIAGNOSIS — F32A Depression, unspecified: Secondary | ICD-10-CM | POA: Diagnosis not present

## 2020-03-19 DIAGNOSIS — M2550 Pain in unspecified joint: Secondary | ICD-10-CM

## 2020-03-19 DIAGNOSIS — K219 Gastro-esophageal reflux disease without esophagitis: Secondary | ICD-10-CM

## 2020-03-19 DIAGNOSIS — Z0000 Encounter for general adult medical examination without abnormal findings: Secondary | ICD-10-CM | POA: Diagnosis not present

## 2020-03-19 LAB — COMPREHENSIVE METABOLIC PANEL
ALT: 24 U/L (ref 0–35)
AST: 14 U/L (ref 0–37)
Albumin: 3.9 g/dL (ref 3.5–5.2)
Alkaline Phosphatase: 62 U/L (ref 39–117)
BUN: 11 mg/dL (ref 6–23)
CO2: 27 mEq/L (ref 19–32)
Calcium: 9.2 mg/dL (ref 8.4–10.5)
Chloride: 107 mEq/L (ref 96–112)
Creatinine, Ser: 0.68 mg/dL (ref 0.40–1.20)
GFR: 109.09 mL/min (ref >60.00)
Glucose, Bld: 126 mg/dL — ABNORMAL HIGH (ref 70–99)
Potassium: 3.6 mEq/L (ref 3.5–5.1)
Sodium: 141 mEq/L (ref 135–145)
Total Bilirubin: 0.5 mg/dL (ref 0.2–1.2)
Total Protein: 7.2 g/dL (ref 6.0–8.3)

## 2020-03-19 LAB — CBC
HCT: 37.2 % (ref 36.0–46.0)
Hemoglobin: 12.6 g/dL (ref 12.0–15.0)
MCHC: 33.8 g/dL (ref 30.0–36.0)
MCV: 95.8 fl (ref 78.0–100.0)
Platelets: 170 10*3/uL (ref 150.0–400.0)
RBC: 3.88 Mil/uL (ref 3.87–5.11)
RDW: 13.2 % (ref 11.5–15.5)
WBC: 5 10*3/uL (ref 4.0–10.5)

## 2020-03-19 LAB — LIPID PANEL
Cholesterol: 190 mg/dL (ref 0–200)
HDL: 47.4 mg/dL (ref >39.00)
LDL Cholesterol: 106 mg/dL — ABNORMAL HIGH (ref 0–99)
NonHDL: 142.38
Total CHOL/HDL Ratio: 4
Triglycerides: 181 mg/dL — ABNORMAL HIGH (ref 0.0–149.0)
VLDL: 36.2 mg/dL (ref 0.0–40.0)

## 2020-03-19 LAB — HEMOGLOBIN A1C: Hgb A1c MFr Bld: 6.1 % (ref 4.6–6.5)

## 2020-03-19 LAB — VITAMIN D 25 HYDROXY (VIT D DEFICIENCY, FRACTURES): VITD: 20.36 ng/mL — ABNORMAL LOW (ref 30.00–100.00)

## 2020-03-19 MED ORDER — FAMOTIDINE 20 MG PO TABS: 20.0000 mg | ORAL_TABLET | Freq: Every day | ORAL | 3 refills | Status: DC

## 2020-03-19 NOTE — Assessment & Plan Note (Signed)
Continue Hydroxyzine prn Will monitor

## 2020-03-19 NOTE — Progress Notes (Signed)
Subjective:    Patient ID: [name], female    DOB: [birth year], 54 y.o.   MRN: [id number]  HPI  Patient presents the clinic today for her annual exam.  She is also due to follow-up chronic conditions.  Chronic Pain: Managed with Flexeril, Voltaran Gel and Tramadol.  She no longer follows with pain management.  Anxiety and Depression: Work related. She is currently taking a leave from work, but thinking about quitting. Chronic dysthymia, situational anxiety.  She is taking Hydroxyzine as needed. She is not currently seeing a therapist.  She denies SI/HI.  GERD: Managed on Pantoprazole and Famotidine. She is not taking Carafate at this time. There is no upper GI on file, but she does have this schedule. She follows with GI.  Flu: 02/2019 Tetanus: 09/2016 Covid: Pfizer Pap smear: 03/2019, hysterectomy Mammogram: 06/2019 Colon screening: scheduled Vision screening: annually Dentist: annually  Diet: She does ea tean meat. She consumes fruits and veggies daily. She tries to avoid fried foods. She drinks mostly water. Exercise: Walking  Review of Systems      Past Medical History:  Diagnosis Date  . Allergy   . DDD (degenerative disc disease), lumbar   . Depression     Current Outpatient Medications  Medication Sig Dispense Refill  . albuterol (VENTOLIN HFA) 108 (90 Base) MCG/ACT inhaler Inhale 2 puffs into the lungs every 6 (six) hours as needed for wheezing or shortness of breath. 8 g 0  . ALLEGRA-D ALLERGY & CONGESTION 180-240 MG 24 hr tablet TAKE 1 TABLET BY MOUTH EVERY DAY AS NEEDED 30 tablet 1  . cyclobenzaprine (FLEXERIL) 10 MG tablet Take 1 tablet (10 mg total) by mouth 3 (three) times daily as needed for muscle spasms. 30 tablet 0  . diclofenac Sodium (VOLTAREN) 1 % GEL APPLY 2 GRAMS TO AFFECTED AREA 4 TIMES A DAY 100 g 0  . famotidine (PEPCID) 20 MG tablet Take 1 tablet (20 mg total) by mouth daily. 30 tablet 2  . fluticasone (FLONASE) 50 MCG/ACT nasal spray  Place 2 sprays into both nostrils daily. 16 g 6  . hydrOXYzine (ATARAX/VISTARIL) 25 MG tablet Take 1 tablet (25 mg total) by mouth daily as needed. 90 tablet 1  . ondansetron (ZOFRAN ODT) 4 MG disintegrating tablet Take 1 tablet (4 mg total) by mouth every 8 (eight) hours as needed for nausea or vomiting. 50 tablet 0  . pantoprazole (PROTONIX) 40 MG tablet Take 1 tablet (40 mg total) by mouth 2 (two) times daily. 60 tablet 3  . Sodium Sulfate-Mag Sulfate-KCl (SUTAB) [phone] MG TABS As directed MANUFACTURER CODES!! BIN: [id number] PCN: CN GROUP: [id number] MEMBER ID: [id number];TML AS CASH;NO PRIOR AUTHORIZATION 12 tablet 0  . sucralfate (CARAFATE) 1 g tablet Take 1 tablet (1 g total) by mouth 4 (four) times daily -  with meals and at bedtime. 120 tablet 0   No current facility-administered medications for this visit.    Allergies  Allergen Reactions  . Orange Concentrate [Flavoring Agent] Rash    Rash on right side of body  . Gabapentin Other (See Comments)    Hair loss  . Penicillins Other (See Comments)    Childhood allergy Has patient had a PCN reaction causing immediate rash, facial/tongue/throat swelling, SOB or lightheadedness with hypotension: Unknown Has patient had a PCN reaction causing severe rash involving mucus membranes or skin necrosis: Unknown Has patient had a PCN reaction that required hospitalization: No  Has patient had a PCN reaction occurring within the last  10 years: No  If all of the above answers are "NO", then may proceed with Cephalosporin use.     Family History  Problem Relation Age of Onset  . Hyperlipidemia Mother   . Heart disease Maternal Aunt   . Stroke Maternal Aunt   . Diabetes Maternal Aunt   . Heart disease Maternal Uncle   . Stroke Maternal Uncle   . Cancer Neg Hx     Social History   Socioeconomic History  . Marital status: Married    Spouse name: Not on file  . Number of children: Not on file  . Years of education: Not on file  .  Highest education level: Not on file  Occupational History  . Not on file  Tobacco Use  . Smoking status: Never Smoker  . Smokeless tobacco: Never Used  Vaping Use  . Vaping Use: Never used  Substance and Sexual Activity  . Alcohol use: Yes    Comment: social  . Drug use: No  . Sexual activity: Yes    Partners: Male    Birth control/protection: Post-menopausal, Surgical  Other Topics Concern  . Not on file  Social History Narrative  . Not on file   Social Determinants of Health   Financial Resource Strain:   . Difficulty of Paying Living Expenses: Not on file  Food Insecurity:   . Worried About Charity fundraiser in the Last Year: Not on file  . Ran Out of Food in the Last Year: Not on file  Transportation Needs:   . Lack of Transportation (Medical): Not on file  . Lack of Transportation (Non-Medical): Not on file  Physical Activity:   . Days of Exercise per Week: Not on file  . Minutes of Exercise per Session: Not on file  Stress:   . Feeling of Stress : Not on file  Social Connections:   . Frequency of Communication with Friends and Family: Not on file  . Frequency of Social Gatherings with Friends and Family: Not on file  . Attends Religious Services: Not on file  . Active Member of Clubs or Organizations: Not on file  . Attends Archivist Meetings: Not on file  . Marital Status: Not on file  Intimate Partner Violence:   . Fear of Current or Ex-Partner: Not on file  . Emotionally Abused: Not on file  . Physically Abused: Not on file  . Sexually Abused: Not on file     Constitutional: Denies fever, malaise, fatigue, headache or abrupt weight changes.  HEENT: Denies eye pain, eye redness, ear pain, ringing in the ears, wax buildup, runny nose, nasal congestion, bloody nose, or sore throat. Respiratory: Denies difficulty breathing, shortness of breath, cough or sputum production.   Cardiovascular: Denies chest pain, chest tightness, palpitations or  swelling in the hands or feet.  Gastrointestinal: Pt reports reflux. Denies abdominal pain, bloating, constipation, diarrhea or blood in the stool.  GU: Denies urgency, frequency, pain with urination, burning sensation, blood in urine, odor or discharge. Musculoskeletal: Pt reports chronic muscle and joint pain. Denies decrease in range of motion, difficulty with gait, or joint swelling.  Skin: Denies redness, rashes, lesions or ulcercations.  Neurological: Denies dizziness, difficulty with memory, difficulty with speech or problems with balance and coordination.  Psych: Pt has a history of anxiety and depression. Denies SI/HI.  No other specific complaints in a complete review of systems (except as listed in HPI above).  Objective:   Physical Exam  BP 122/78  Pulse 93   Temp 98 F (36.7 C) (Temporal)   Ht $R'5\' 5"'JK$  (1.651 m)   Wt 240 lb (108.9 kg)   SpO2 98%   BMI 39.94 kg/m   Wt Readings from Last 3 Encounters:  02/10/20 238 lb (108 kg)  12/12/19 244 lb (110.7 kg)  12/01/19 163 lb (73.9 kg)    General: Appears her stated age, obese, in NAD. Skin: Warm, dry and intact. No rashes, lesions or ulcerations noted. HEENT: Head: normal shape and size; Eyes: sclera white, no icterus, conjunctiva pink, PERRLA and EOMs intact;  Neck:  Neck supple, trachea midline. No masses, lumps present. Thyromegaly noted. Cardiovascular: Normal rate and rhythm. S1,S2 noted.  No murmur, rubs or gallops noted. No JVD or BLE edema. No carotid bruits noted. Pulmonary/Chest: Normal effort and positive vesicular breath sounds. No respiratory distress. No wheezes, rales or ronchi noted.  Abdomen: Soft and nontender. Normal bowel sounds. No distention or masses noted. Liver, spleen and kidneys non palpable. Musculoskeletal: Strength 5/5 BUE/BLE. No difficulty with gait.  Neurological: Alert and oriented. Cranial nerves II-XII grossly intact. Coordination normal.  Psychiatric: Mood and affect normal. Behavior is  normal. Judgment and thought content normal.    BMET    Component Value Date/Time   NA 140 12/01/2019 1558   NA 142 09/18/2016 0929   K 3.7 12/01/2019 1558   CL 107 12/01/2019 1558   CO2 24 12/01/2019 1558   GLUCOSE 175 (H) 12/01/2019 1558   BUN 14 12/01/2019 1558   BUN 9 09/18/2016 0929   CREATININE 0.68 12/01/2019 1558   CALCIUM 9.5 12/01/2019 1558   GFRNONAA >60 12/01/2019 1558   GFRAA >60 12/01/2019 1558    Lipid Panel     Component Value Date/Time   CHOL 186 02/25/2019 1005   CHOL 200 (H) 03/23/2017 0844   TRIG 96.0 02/25/2019 1005   HDL 52.40 02/25/2019 1005   HDL 57 03/23/2017 0844   CHOLHDL 4 02/25/2019 1005   VLDL 19.2 02/25/2019 1005   LDLCALC 115 (H) 02/25/2019 1005   LDLCALC 124 (H) 03/23/2017 0844    CBC    Component Value Date/Time   WBC 8.9 12/01/2019 1558   RBC 4.15 12/01/2019 1558   HGB 13.1 12/01/2019 1558   HGB 13.2 09/18/2016 0929   HCT 37.8 12/01/2019 1558   HCT 38.1 09/18/2016 0929   PLT 199 12/01/2019 1558   PLT 268 09/18/2016 0929   MCV 91.1 12/01/2019 1558   MCV 94 09/18/2016 0929   MCH 31.6 12/01/2019 1558   MCHC 34.7 12/01/2019 1558   RDW 12.5 12/01/2019 1558   RDW 12.8 09/18/2016 0929   LYMPHSABS 2.8 08/30/2019 0315   LYMPHSABS 2.8 08/03/2014 1609   MONOABS 0.6 08/30/2019 0315   EOSABS 0.1 08/30/2019 0315   EOSABS 0.1 08/03/2014 1609   BASOSABS 0.0 08/30/2019 0315   BASOSABS 0.0 08/03/2014 1609    Hgb A1C Lab Results  Component Value Date   HGBA1C 5.9 09/09/2019           Assessment & Plan:   Preventative Health Maintenance:  Flu shot today Tetanus UTD Covid UTD She no longer needs Pap smears secondary to hysterectomy Mammogram due 06/2020 Colon screening scheduled Encouraged her to consume a balanced diet and exercise regimen Advised her to see an eye doctor and dentist annually We will check CBC, C met, lipid, A1c and vitamin D today  RTC in 1 year, sooner if needed  Webb Silversmith, NP This visit  occurred during the  SARS-CoV-2 public health emergency.  Safety protocols were in place, including screening questions prior to the visit, additional usage of staff PPE, and extensive cleaning of exam room while observing appropriate contact time as indicated for disinfecting solutions.

## 2020-03-19 NOTE — Assessment & Plan Note (Signed)
Continue Flexeril, Voltaren Gel and Tramadol as needed She no longer follows with pain management

## 2020-03-19 NOTE — Assessment & Plan Note (Signed)
Continue Pantoprazole and Famotidine, Famotidine refilled today CBC and C met today

## 2020-03-19 NOTE — Patient Instructions (Signed)

## 2020-03-20 ENCOUNTER — Other Ambulatory Visit: Payer: Self-pay

## 2020-03-20 MED ORDER — VITAMIN D (ERGOCALCIFEROL) 1.25 MG (50000 UNIT) PO CAPS: 50000.0000 [IU] | ORAL_CAPSULE | ORAL | 0 refills | Status: DC

## 2020-03-20 MED ORDER — ONDANSETRON 4 MG PO TBDP: 4.0000 mg | ORAL_TABLET | Freq: Three times a day (TID) | ORAL | 0 refills | Status: DC | PRN

## 2020-03-20 NOTE — Addendum Note (Signed)
Addended by: Lurlean Nanny on: 03/20/2020 08:19 AM   Modules accepted: Orders

## 2020-04-06 ENCOUNTER — Encounter: Payer: Self-pay | Admitting: Gastroenterology

## 2020-04-06 ENCOUNTER — Ambulatory Visit (AMBULATORY_SURGERY_CENTER): Payer: 59 | Admitting: Gastroenterology

## 2020-04-06 ENCOUNTER — Other Ambulatory Visit: Payer: Self-pay

## 2020-04-06 VITALS — BP 118/79 | HR 78 | Temp 97.3°F | Resp 17 | Ht 65.0 in | Wt 240.0 lb

## 2020-04-06 DIAGNOSIS — K3189 Other diseases of stomach and duodenum: Secondary | ICD-10-CM | POA: Diagnosis not present

## 2020-04-06 DIAGNOSIS — K2950 Unspecified chronic gastritis without bleeding: Secondary | ICD-10-CM

## 2020-04-06 DIAGNOSIS — D123 Benign neoplasm of transverse colon: Secondary | ICD-10-CM | POA: Diagnosis not present

## 2020-04-06 DIAGNOSIS — R1310 Dysphagia, unspecified: Secondary | ICD-10-CM

## 2020-04-06 DIAGNOSIS — R109 Unspecified abdominal pain: Secondary | ICD-10-CM | POA: Diagnosis not present

## 2020-04-06 DIAGNOSIS — D125 Benign neoplasm of sigmoid colon: Secondary | ICD-10-CM | POA: Diagnosis not present

## 2020-04-06 DIAGNOSIS — K2970 Gastritis, unspecified, without bleeding: Secondary | ICD-10-CM

## 2020-04-06 DIAGNOSIS — K319 Disease of stomach and duodenum, unspecified: Secondary | ICD-10-CM | POA: Diagnosis not present

## 2020-04-06 DIAGNOSIS — D122 Benign neoplasm of ascending colon: Secondary | ICD-10-CM | POA: Diagnosis not present

## 2020-04-06 DIAGNOSIS — K219 Gastro-esophageal reflux disease without esophagitis: Secondary | ICD-10-CM | POA: Diagnosis not present

## 2020-04-06 MED ORDER — SODIUM CHLORIDE 0.9 % IV SOLN: 500.0000 mL | Freq: Once | INTRAVENOUS | Status: DC

## 2020-04-06 NOTE — Progress Notes (Signed)
Called to room to assist during endoscopic procedure.  Patient ID and intended procedure confirmed with present staff. Received instructions for my participation in the procedure from the performing physician.  

## 2020-04-06 NOTE — Patient Instructions (Signed)
Handouts Provided:  Polyps and Diverticulosis ? ?YOU HAD AN ENDOSCOPIC PROCEDURE TODAY AT THE [facility]:   Refer to the procedure report that was given to you for any specific questions about what was found during the examination.  If the procedure report does not answer your questions, please call your gastroenterologist to clarify.  If you requested that your care partner not be given the details of your procedure findings, then the procedure report has been included in a sealed envelope for you to review at your convenience later. ? ?YOU SHOULD EXPECT: Some feelings of bloating in the abdomen. Passage of more gas than usual.  Walking can help get rid of the air that was put into your GI tract during the procedure and reduce the bloating. If you had a lower endoscopy (such as a colonoscopy or flexible sigmoidoscopy) you may notice spotting of blood in your stool or on the toilet paper. If you underwent a bowel prep for your procedure, you may not have a normal bowel movement for a few days. ? ?Please Note:  You might notice some irritation and congestion in your nose or some drainage.  This is from the oxygen used during your procedure.  There is no need for concern and it should clear up in a day or so. ? ?SYMPTOMS TO REPORT IMMEDIATELY: ? ?Following lower endoscopy (colonoscopy or flexible sigmoidoscopy): ? Excessive amounts of blood in the stool ? Significant tenderness or worsening of abdominal pains ? Swelling of the abdomen that is new, acute ? Fever of 100?F or higher ? ?Following upper endoscopy (EGD) ? Vomiting of blood or coffee ground material ? New chest pain or pain under the shoulder blades ? Painful or persistently difficult swallowing ? New shortness of breath ? Fever of 100?F or higher ? Black, tarry-looking stools ? ?For urgent or emergent issues, a gastroenterologist can be reached at any hour by calling [phone]. ?Do not use MyChart messaging for urgent concerns.   ? ? ?DIET:  We do recommend a small meal at first, but then you may proceed to your regular diet.  Drink plenty of fluids but you should avoid alcoholic beverages for 24 hours. ? ?ACTIVITY:  You should plan to take it easy for the rest of today and you should NOT DRIVE or use heavy machinery until tomorrow (because of the sedation medicines used during the test).   ? ?FOLLOW UP: ?Our staff will call the number listed on your records 48-72 hours following your procedure to check on you and address any questions or concerns that you may have regarding the information given to you following your procedure. If we do not reach you, we will leave a message.  We will attempt to reach you two times.  During this call, we will ask if you have developed any symptoms of COVID 19. If you develop any symptoms (ie: fever, flu-like symptoms, shortness of breath, cough etc.) before then, please call [phone].  If you test positive for Covid 19 in the 2 weeks post procedure, please call and report this information to us.   ? ?If any biopsies were taken you will be contacted by phone or by letter within the next 1-3 weeks.  Please call us at [phone] if you have not heard about the biopsies in 3 weeks.  ? ? ?SIGNATURES/CONFIDENTIALITY: ?You and/or your care partner have signed paperwork which will be entered into your electronic medical record.  These signatures attest to the fact that   fact that that the information above on your After Visit Summary has been reviewed and is understood.  Full responsibility of the confidentiality of this discharge information lies with you and/or your care-partner.

## 2020-04-06 NOTE — Progress Notes (Signed)
PT taken to PACU. Monitors in place. VSS. Report given to RN. 

## 2020-04-06 NOTE — Progress Notes (Signed)
Pt's states no medical or surgical changes since previsit or office visit. 

## 2020-04-06 NOTE — Op Note (Signed)
[place] Patient Name: [name] Procedure Date: 04/06/2020 1:25 PM MRN: [id number] Endoscopist: Thornton Park MD, MD Age: 54 Referring MD:  Date of Birth: Jul 18, 1965 Gender: Female Account #: [id number] Procedure:                Upper GI endoscopy Indications:              Abdominal pain requiring ED visit, not improved                            following cholecystectomy                           40 pound intentional weight loss Medicines:                Monitored Anesthesia Care Procedure:                Pre-Anesthesia Assessment:                           - Prior to the procedure, a History and Physical                            was performed, and patient medications and                            allergies were reviewed. The patient's tolerance of                            previous anesthesia was also reviewed. The risks                            and benefits of the procedure and the sedation                            options and risks were discussed with the patient.                            All questions were answered, and informed consent                            was obtained. Prior Anticoagulants: The patient has                            taken no previous anticoagulant or antiplatelet                            agents. ASA Grade Assessment: III - A patient with                            severe systemic disease. After reviewing the risks                            and benefits, the patient was deemed in  satisfactory condition to undergo the procedure.                           After obtaining informed consent, the endoscope was                            passed under direct vision. Throughout the                            procedure, the patient's blood pressure, pulse, and                            oxygen saturations were monitored continuously. The                            Endoscope was introduced through the  mouth, and                            advanced to the third part of duodenum. The upper                            GI endoscopy was accomplished without difficulty.                            The patient tolerated the procedure well. Scope In: Scope Out: Findings:                 The examined esophagus was normal.                           Striped moderately erythematous mucosa without                            bleeding was found in the gastric body and in the                            gastric antrum. Biopsies were taken from the                            antrum, body, and fundus with a cold forceps for                            histology. Estimated blood loss was minimal.                           The examined duodenum was normal. Biopsies were                            taken with a cold forceps for histology. Estimated                            blood loss was minimal.                           The cardia  and gastric fundus were normal on                            retroflexion.                           The exam was otherwise without abnormality. Complications:            No immediate complications. Estimated blood loss:                            Minimal. Estimated Blood Loss:     Estimated blood loss was minimal. Impression:               - Normal esophagus.                           - Erythematous mucosa in the gastric body and                            antrum. Biopsied.                           - Normal examined duodenum. Biopsied.                           - The examination was otherwise normal. Recommendation:           - Patient has a contact number available for                            emergencies. The signs and symptoms of potential                            delayed complications were discussed with the                            patient. Return to normal activities tomorrow.                            Written discharge instructions were provided to the                             patient.                           - Resume previous diet.                           - Continue present medications.                           - No aspirin, ibuprofen, naproxen, or other                            non-steroidal anti-inflammatory drugs.                           -  Await pathology results.                           - Proceed with colonoscopy as previously planned. Thornton Park MD, MD 04/06/2020 2:26:50 PM This report has been signed electronically.

## 2020-04-06 NOTE — Op Note (Signed)
[facility] Patient Name: [name] Procedure Date: 04/06/2020 1:25 PM MRN: [id number] Endoscopist: Thornton Park MD, MD Age: 54 Referring MD:  Date of Birth: 1966-01-21 Gender: Female Account #: [id number] Procedure:                Colonoscopy Indications:              Abdominal pain requiring ED visit, not improved                            following cholecystectomy                           Dysphagia in the setting of well-controlled GERD                           40 pound intentional weight loss Medicines:                Monitored Anesthesia Care Procedure:                Pre-Anesthesia Assessment:                           - Prior to the procedure, a History and Physical                            was performed, and patient medications and                            allergies were reviewed. The patient's tolerance of                            previous anesthesia was also reviewed. The risks                            and benefits of the procedure and the sedation                            options and risks were discussed with the patient.                            All questions were answered, and informed consent                            was obtained. Prior Anticoagulants: The patient has                            taken no previous anticoagulant or antiplatelet                            agents. ASA Grade Assessment: III - A patient with                            severe systemic disease. After reviewing the risks  and benefits, the patient was deemed in                            satisfactory condition to undergo the procedure.                           After obtaining informed consent, the colonoscope                            was passed under direct vision. Throughout the                            procedure, the patient's blood pressure, pulse, and                            oxygen saturations were monitored continuously.  The                            Colonoscope was introduced through the anus and                            advanced to the 3 cm into the ileum. The                            colonoscopy was performed without difficulty. The                            patient tolerated the procedure well. The quality                            of the bowel preparation was good. The terminal                            ileum, ileocecal valve, appendiceal orifice, and                            rectum were photographed. Scope In: 2:06:07 PM Scope Out: 2:21:42 PM Scope Withdrawal Time: 0 hours 11 minutes 14 seconds  Total Procedure Duration: 0 hours 15 minutes 35 seconds  Findings:                 The perianal and digital rectal examinations were                            normal.                           Two flat polyps were found in the sigmoid colon.                            The polyps were 1 mm in size. These polyps were                            removed with a cold snare. Resection and retrieval  were complete. Estimated blood loss was minimal.                           A 10 mm polyp was found in the ascending colon. The                            polyp was semi-pedunculated. The polyp was removed                            with a cold snare. Resection and retrieval were                            complete. Estimated blood loss was minimal.                           A 3 mm polyp was found in the ascending colon. The                            polyp was sessile. The polyp was removed with a                            cold snare. Resection and retrieval were complete.                            Estimated blood loss was minimal. Complications:            No immediate complications. Estimated blood loss:                            Minimal. Estimated Blood Loss:     Estimated blood loss was minimal. Impression:               - Two 1 mm polyps in the sigmoid colon, removed                             with a cold snare. Resected and retrieved.                           - One 10 mm polyp in the ascending colon, removed                            with a cold snare. Resected and retrieved.                           - The examination was otherwise normal on direct                            and retroflexion views. Recommendation:           - Patient has a contact number available for                            emergencies. The signs and symptoms of potential  delayed complications were discussed with the                            patient. Return to normal activities tomorrow.                            Written discharge instructions were provided to the                            patient.                           - Resume previous diet.                           - Continue present medications.                           - Await pathology results.                           - Repeat colonoscopy date to be determined after                            pending pathology results are reviewed for                            surveillance.                           - Emerging evidence supports eating a diet of                            fruits, vegetables, grains, calcium, and yogurt                            while reducing red meat and alcohol may reduce the                            risk of colon cancer.                           - Thank you for allowing me to be involved in your                            colon cancer prevention. Thornton Park MD, MD 04/06/2020 2:31:43 PM This report has been signed electronically.

## 2020-04-09 ENCOUNTER — Other Ambulatory Visit: Payer: Self-pay

## 2020-04-09 MED ORDER — SUCRALFATE 1 G PO TABS: 1.0000 g | ORAL_TABLET | Freq: Three times a day (TID) | ORAL | 0 refills | Status: DC

## 2020-04-10 ENCOUNTER — Telehealth: Payer: Self-pay

## 2020-04-10 NOTE — Telephone Encounter (Signed)
  Follow up Call-  Call back number 04/06/2020  Post procedure Call Back phone  # [phone]  Permission to leave phone message Yes  Some recent data might be hidden     Patient questions:  Do you have a fever, pain , or abdominal swelling? No. Pain Score  0 *  Have you tolerated food without any problems? Yes.    Have you been able to return to your normal activities? Yes.    Do you have any questions about your discharge instructions: Diet   No. Medications  No. Follow up visit  No.  Do you have questions or concerns about your Care? No.  Actions: * If pain score is 4 or above: No action needed, pain <4.  1. Have you developed a fever since your procedure? no  2.   Have you had an respiratory symptoms (SOB or cough) since your procedure? no  3.   Have you tested positive for COVID 19 since your procedure no  4.   Have you had any family members/close contacts diagnosed with the COVID 19 since your procedure?  no   If yes to any of these questions please route to Joylene John, RN and Joella Prince, RN

## 2020-04-16 ENCOUNTER — Encounter: Payer: Self-pay | Admitting: Gastroenterology

## 2020-05-20 IMAGING — MG DIGITAL SCREENING BILATERAL MAMMOGRAM WITH TOMO AND CAD
8 series · 8 of 24 positions shown · non-contrast
Comparison: Previous exam(s).

CLINICAL DATA: Screening.

EXAM:
DIGITAL SCREENING BILATERAL MAMMOGRAM WITH TOMO AND CAD

[L CC synth-2D]
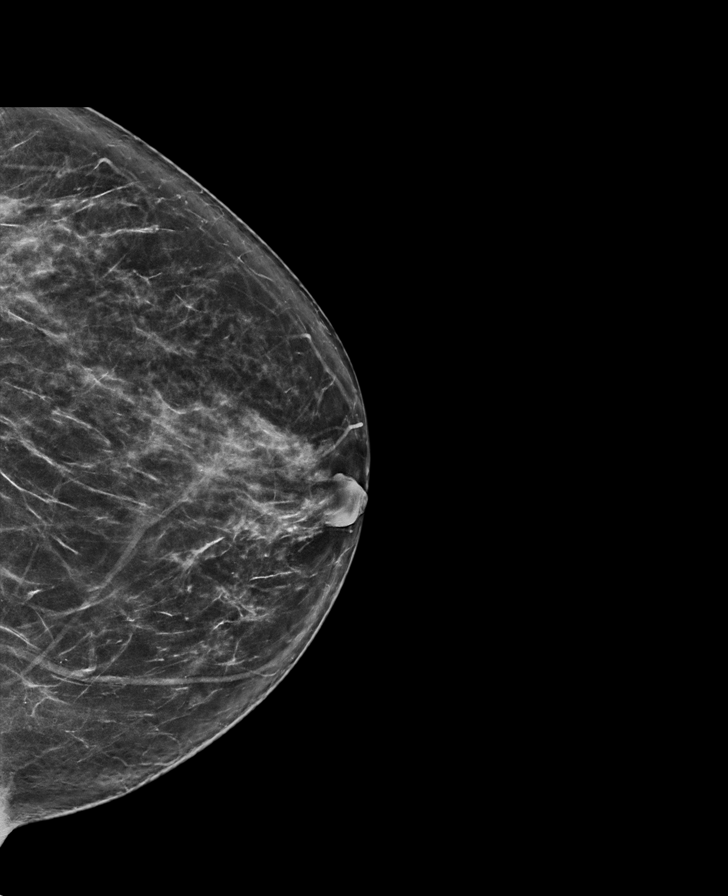

[R MLO synth-2D]
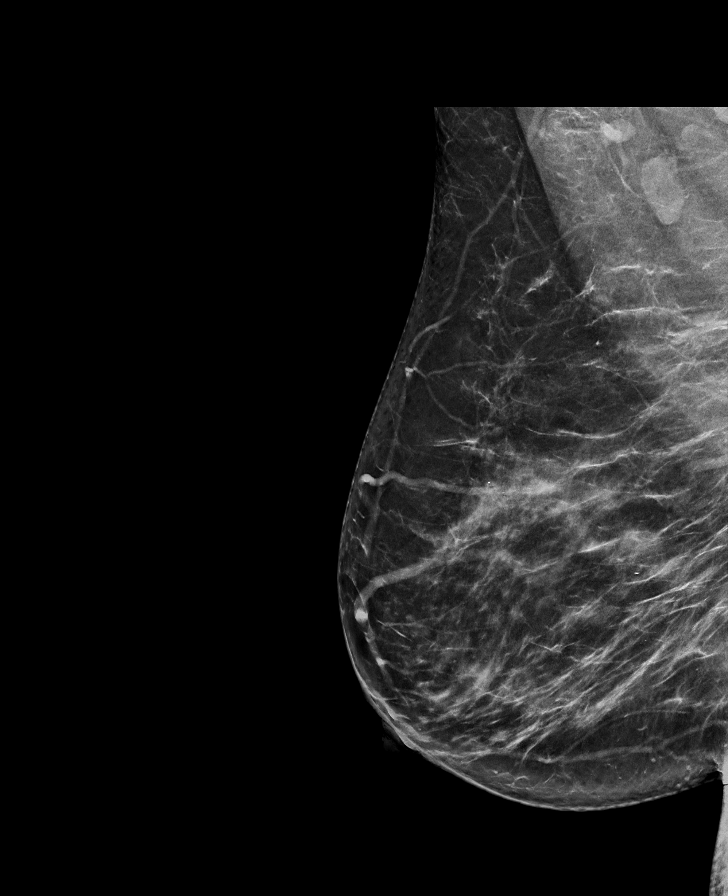

[L MLO synth-2D]
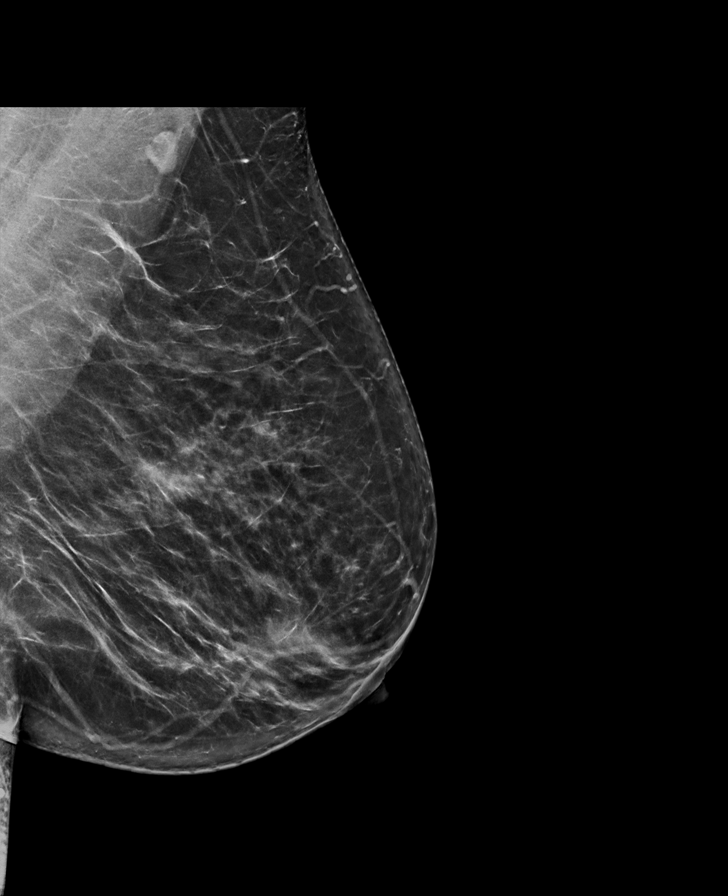

[R CC synth-2D]
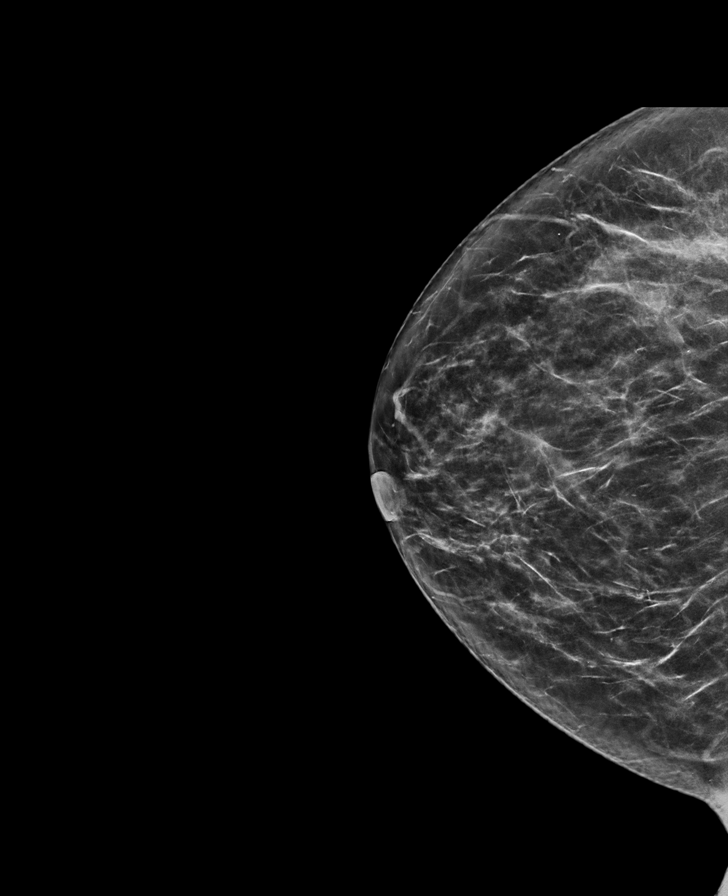

[R CC tomo · tomo slice 37/73.0]
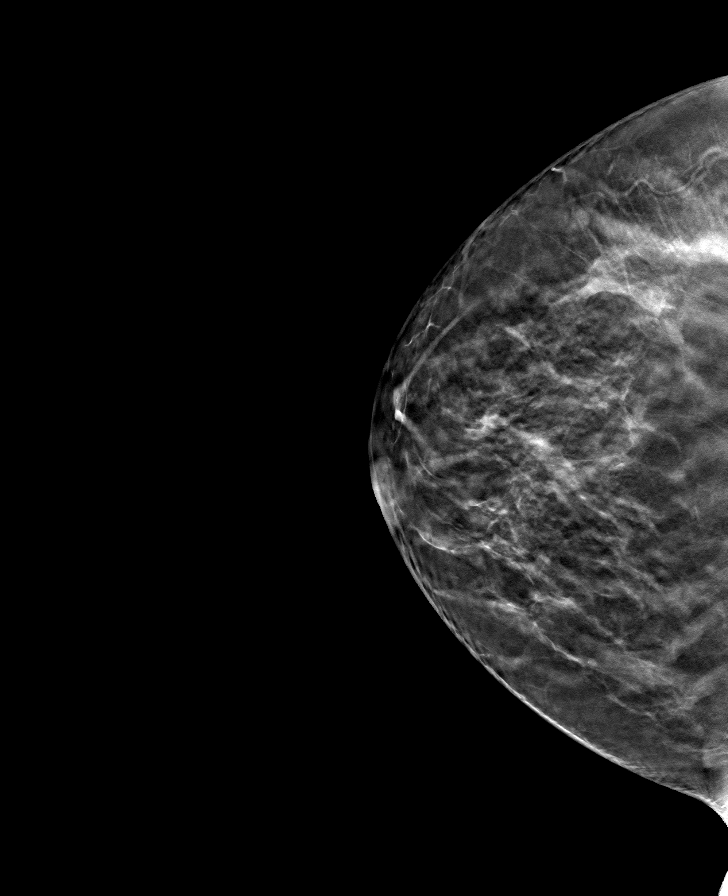

[L MLO tomo · tomo slice 42/83.0]
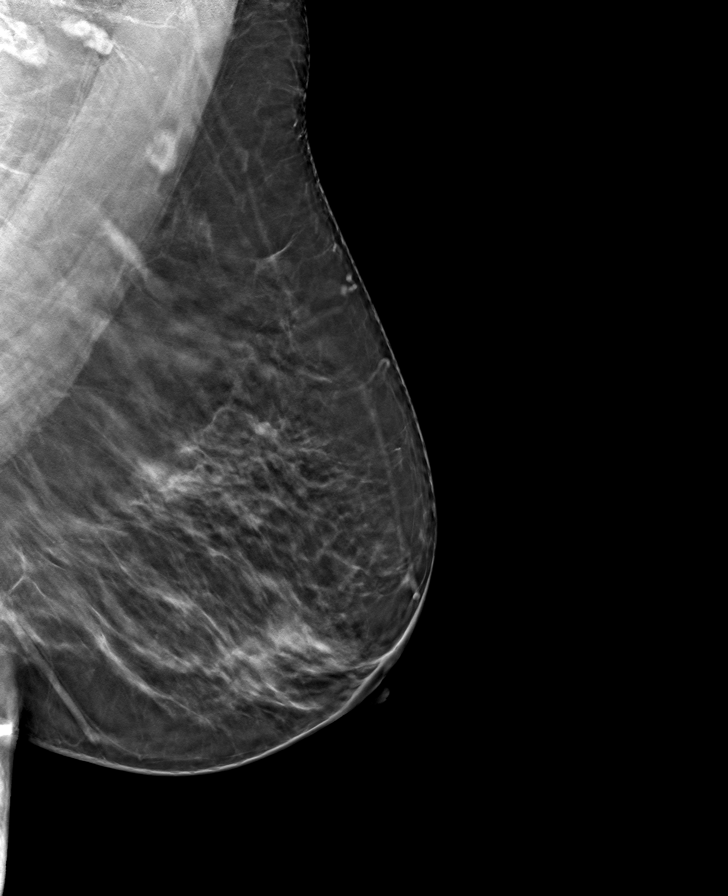

[R MLO tomo · tomo slice 43/85.0]
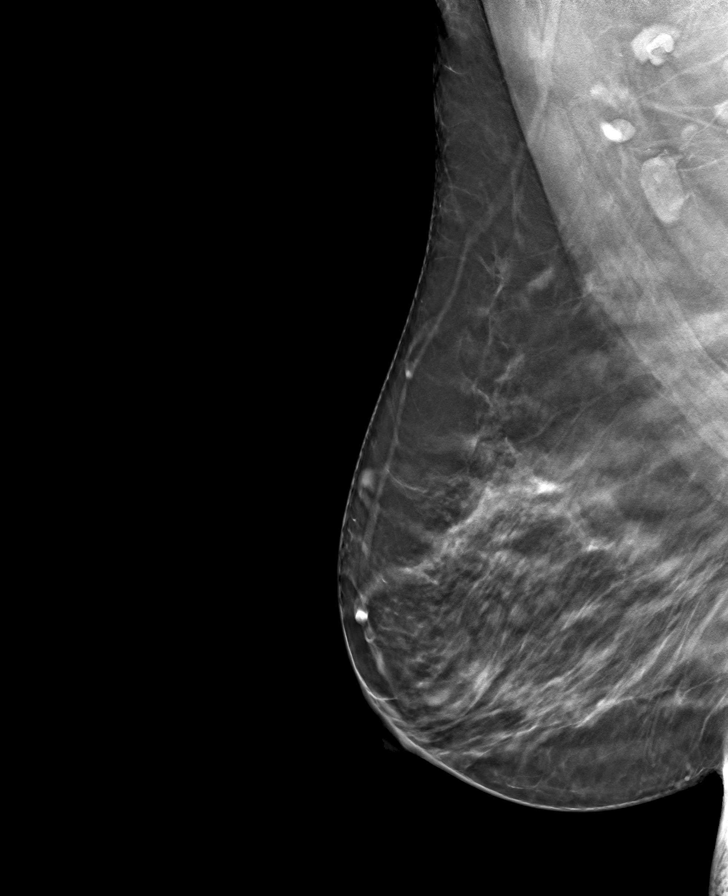

[L CC tomo · tomo slice 37/74.0]
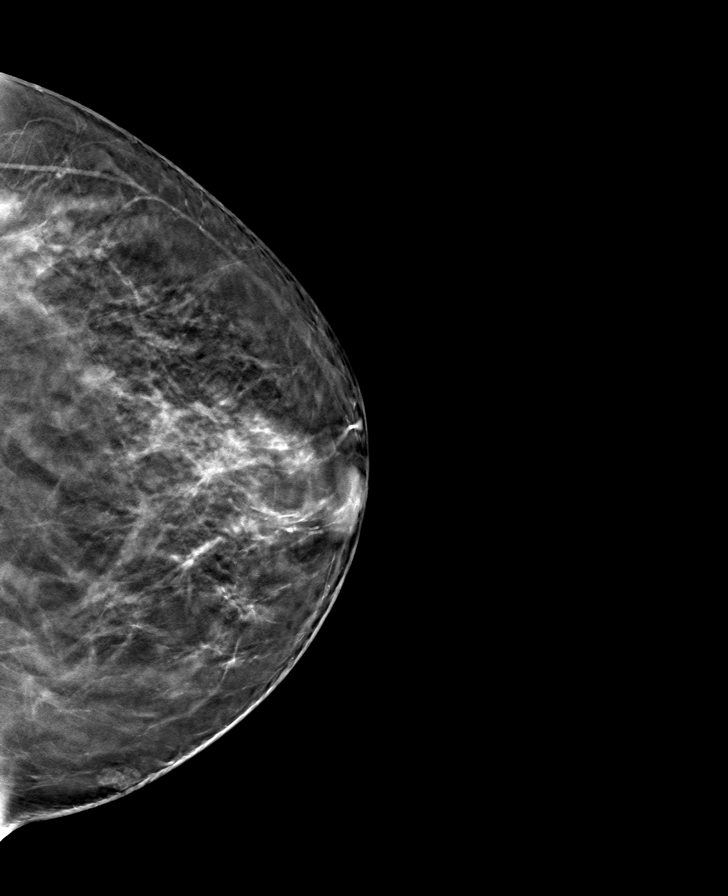

[8 of 24 positions shown; findings below may reference images not displayed]

ACR Breast Density Category b: There are scattered areas of
fibroglandular density.
FINDINGS: There are no findings suspicious for malignancy. Images were
processed with CAD.
IMPRESSION: No mammographic evidence of malignancy. A result letter of this
screening mammogram will be mailed directly to the patient.

RECOMMENDATION:
Screening mammogram in one year. (Code:[id number]-775)

BI-RADS CATEGORY  1: Negative.

## 2020-05-21 ENCOUNTER — Ambulatory Visit (INDEPENDENT_AMBULATORY_CARE_PROVIDER_SITE_OTHER): Payer: 59 | Admitting: Gastroenterology

## 2020-05-21 ENCOUNTER — Encounter: Payer: Self-pay | Admitting: Gastroenterology

## 2020-05-21 VITALS — BP 118/66 | HR 74 | Ht 65.5 in | Wt 239.0 lb

## 2020-05-21 DIAGNOSIS — K6289 Other specified diseases of anus and rectum: Secondary | ICD-10-CM

## 2020-05-21 DIAGNOSIS — R1310 Dysphagia, unspecified: Secondary | ICD-10-CM

## 2020-05-21 MED ORDER — FAMOTIDINE 20 MG PO TABS: 20.0000 mg | ORAL_TABLET | Freq: Two times a day (BID) | ORAL | 0 refills | Status: DC | PRN

## 2020-05-21 MED ORDER — HYDROCORTISONE ACETATE 25 MG RE SUPP: 25.0000 mg | Freq: Two times a day (BID) | RECTAL | 0 refills | Status: DC

## 2020-05-21 MED ORDER — PANTOPRAZOLE SODIUM 40 MG PO TBEC: 40.0000 mg | DELAYED_RELEASE_TABLET | Freq: Two times a day (BID) | ORAL | 3 refills | Status: DC

## 2020-05-21 MED ORDER — SUCRALFATE 1 G PO TABS: 1.0000 g | ORAL_TABLET | Freq: Four times a day (QID) | ORAL | 0 refills | Status: DC | PRN

## 2020-05-21 NOTE — Progress Notes (Addendum)
Referring Provider: Jearld Fenton, NP Primary Care Physician:  Jearld Fenton, NP  Chief complaint:  Abdominal pain   IMPRESSION:  Abdominal pain requiring ED visit, not improved following cholecystectomy Dysphagia in the setting of well-controlled GERD 40 pound intentional weight loss Lap cholecystectomy 08/30/19 for acute and chronic cholecystitis with cholelithiasis History of colon polyps    - 2 tubular adenomas removed 03/2020, one 61mm    - surveillance colonoscopy recommended in years  Abdominal pain: Not improved after cholecystectomy. No obvious source on CT,  multiple ultrasounds, EGD and colonoscopy except for gastritis. No H pylori.   Dysphagia: No obvious sores on EGD. Continue PPI and H2B. Proceed with esophagram.   Hemorrhoids:  Daily stool bulking agent.  Anusol HC suppositories PRN.   History of colon polyps: 2 tubular adenomas removed 03/2020 including a 32mm polyp. Surveillance colonoscopy recommended in years  PLAN: Continue pantroprazole 40 mg BID, famotidine 20 mg BID PRN Reduce Carafate to 1 g QID PRN Barium esophagram Trial of Anusol HC suppositories Use a daily stool bulking agent such as Metamucil or Benefiber Return to clinic in 3-4 months, earlier as needed  Please see the "Patient Instructions" section for addition details about the plan.  HPI: [name] is a 54 y.o. female referred by NP [facility] for abdominal pain. The history is obtained through the patient and review of her electronic health record. She has gallstones, GERD, hypercholesterolemia, obesity, and prior hysterectomy. Her daughter, [name], is my patient and recently found out that she is pregnant. 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 She had the Covid vaccine.   Presented to the ED for epigastric abdominal pain with associated bloating, nausea, and vomiting.  CT abd/pelvis with contrast 08/20/19: mildly distended gallbladder with suggestion of mild wall  thickening, hepatic steatosis, rectosigmoid diverticulosis. Liver enzymes normal.  Lap cholecystectomy 08/30/19. Pathology showed acute and chronic cholecystitis with cholelithiasis.  Onggoing abdominal pain following surgery.   Abdominal ultrasound x 4 following the CT with the most recent 12/01/19:  fatty liver without acute findings. Surgically absent gallbladder. Small cystic area in the gallbladder fossa thought to be postoperative changes.    ED visit for abdominal pain 12/01/19 resulted in clinical diagnosis of NSAID induced gastritis. Started pantoprazole and Carafate. Symptoms have improved and she continue to take the pantoprazole 40 mg daily. Uses Tylenol for the pain and famotidine PRN. No NSAIDs since June visit to the ED.   Reports intermittent dysphagia to meats and occasionally pasta. Heartburn controlled on pantoprazole. Feels like it hangs up at the sternal notch. Will eat slowly, try to burp, or take a sip of water. No sore throat, neck pain, dysphonia, or odynophagia.   She has lost 40 pounds since she stopped eating sugar or fried foods.   Colonoscopy prior to 2010 at [facility] to evaluate GI symptoms. She does not remember the results.    EGD and colonoscopy were performed 04/06/20: Colonoscopy: - The perianal and digital rectal examinations were normal. - Two flat polyps were found in the sigmoid colon. The polyps were 1 mm in size. These polyps were removed with a cold snare. Resection and retrieval were complete. Estimated blood loss was minimal. - A 10 mm polyp was found in the ascending colon. The polyp was semi-pedunculated. The polyp was removed with a cold snare. Resection and retrieval were complete. Estimated blood loss was minimal. - A 3 mm polyp was found in the ascending colon. The polyp was  sessile. The polyp was removed with a cold snare. Resection and retrieval were complete. Estimated blood loss was Minimal. EGD: - Normal esophagus. - Erythematous mucosa  in the gastric body and antrum. Biopsied. - Normal examined duodenum. Biopsied. - The examination was otherwise normal.  Diagnosis 1. Surgical [P], duodenal bulb, 2nd portion of duodenum, and distal duodenum - UNREMARKABLE DUODENAL MUCOSA. - NO FEATURES OF CELIAC SPRUE OR GRANULOMAS. 2. Surgical [P], gastric antrum - ANTRAL MUCOSA WITH SLIGHT CHRONIC INFLAMMATION AND HYPEREMIA. - WARTHIN-STARRY NEGATIVE FOR HELICOBACTER PYLORI. - NO INTESTINAL METAPLASIA, DYSPLASIA OR CARCINOMA. - 3. Surgical [P], gastric body - OXYNTIC MUCOSA WITH SLIGHT CHRONIC INFLAMMATION AND REACTIVE CHANGES. - WARTHIN-STARRY NEGATIVE FOR HELICOBACTER PYLORI. - NO INTESTINAL METAPLASIA, DYSPLASIA OR CARCINOMA. 4. Surgical [P], stomach, fundus - OXYNTIC MUCOSA WITH HYPEREMIA. - WARTHIN-STARRY NEGATIVE FOR HELICOBACTER PYLORI. - NO INTESTINAL METAPLASIA, DYSPLASIA OR CARCINOMA. 5. Surgical [P], colon, ascending, polyp - TUBULAR ADENOMA(S). - NO HIGH GRADE DYSPLASIA OR CARCINOMA. 6. Surgical [P], colon, sigmoid and ascending, polyp (3) - TUBULAR ADENOMA (ONE). - NO HIGH GRADE DYSPLASIA OR CARCINOMA. - HYPERPLASTIC POLYP (ONE). - BENIGN COLONIC MUCOSA (ONE).   Notes a stressful job.  Happy that she has not regained her weight. Occasional cheats with ice cream.  She feels like a low sugar, low fat diet minimizes her symptoms.   Rare dysphagia to solids with some associated brash.   Some issues with pain and bleeding that she attributes to hemorrhoids. Preparation H OTC has provided limited relief.    Past Medical History:  Diagnosis Date   Allergy    DDD (degenerative disc disease), lumbar    Depression     Past Surgical History:  Procedure Laterality Date   ABDOMINAL HYSTERECTOMY  2008   LAVH, endometriosis ablation, mccall culdoplasty   CHOLECYSTECTOMY N/A 08/31/2019   Procedure: LAPAROSCOPIC CHOLECYSTECTOMY;  Surgeon: Olean Ree, MD;  Location: [facility] ORS;  Service: General;  Laterality: N/A;    DIAGNOSTIC LAPAROSCOPY  2002   Hysteroscopy, diagnostic laparoscopy, extensive lysis of adhesions, endometrial ablation, RSO   RIGHT OOPHORECTOMY  2007   with right salpingectomy   TUBAL LIGATION      Current Outpatient Medications  Medication Sig Dispense Refill   albuterol (VENTOLIN HFA) 108 (90 Base) MCG/ACT inhaler Inhale 2 puffs into the lungs every 6 (six) hours as needed for wheezing or shortness of breath. 8 g 0   ALLEGRA-D ALLERGY & CONGESTION 180-240 MG 24 hr tablet TAKE 1 TABLET BY MOUTH EVERY DAY AS NEEDED 30 tablet 1   cyclobenzaprine (FLEXERIL) 10 MG tablet Take 1 tablet (10 mg total) by mouth 3 (three) times daily as needed for muscle spasms. 30 tablet 0   diclofenac Sodium (VOLTAREN) 1 % GEL APPLY 2 GRAMS TO AFFECTED AREA 4 TIMES A DAY 100 g 0   famotidine (PEPCID) 20 MG tablet Take 1 tablet (20 mg total) by mouth daily. 90 tablet 3   hydrOXYzine (ATARAX/VISTARIL) 25 MG tablet Take 1 tablet (25 mg total) by mouth daily as needed. 90 tablet 1   ondansetron (ZOFRAN ODT) 4 MG disintegrating tablet Take 1 tablet (4 mg total) by mouth every 8 (eight) hours as needed for nausea or vomiting. 50 tablet 0   pantoprazole (PROTONIX) 40 MG tablet Take 1 tablet (40 mg total) by mouth 2 (two) times daily. 60 tablet 3   Vitamin D, Ergocalciferol, (DRISDOL) 1.25 MG (50000 UNIT) CAPS capsule Take 1 capsule (50,000 Units total) by mouth every 7 (seven) days. 12 capsule 0  No current facility-administered medications for this visit.    Allergies as of 05/21/2020 - Review Complete 05/21/2020  Allergen Reaction Noted   Gabapentin Other (See Comments) 09/28/2014   Penicillins Other (See Comments) 02/27/2012   Orange juice [orange oil] Rash 04/06/2020    Family History  Problem Relation Age of Onset   Hyperlipidemia Mother    Heart disease Maternal Aunt    Stroke Maternal Aunt    Diabetes Maternal Aunt    Heart disease Maternal Uncle    Stroke Maternal Uncle     Cancer Neg Hx     Social History   Socioeconomic History   Marital status: Married    Spouse name: Not on file   Number of children: Not on file   Years of education: Not on file   Highest education level: Not on file  Occupational History   Not on file  Tobacco Use   Smoking status: Never Smoker   Smokeless tobacco: Never Used  Vaping Use   Vaping Use: Never used  Substance and Sexual Activity   Alcohol use: Yes    Comment: social   Drug use: No   Sexual activity: Yes    Partners: Male    Birth control/protection: Post-menopausal, Surgical  Other Topics Concern   Not on file  Social History Narrative   Not on file   Social Determinants of Health   Financial Resource Strain:    Difficulty of Paying Living Expenses: Not on file  Food Insecurity:    Worried About Charity fundraiser in the Last Year: Not on file   [employer] of Food in the Last Year: Not on file  Transportation Needs:    Lack of Transportation (Medical): Not on file   Lack of Transportation (Non-Medical): Not on file  Physical Activity:    Days of Exercise per Week: Not on file   Minutes of Exercise per Session: Not on file  Stress:    Feeling of Stress : Not on file  Social Connections:    Frequency of Communication with Friends and Family: Not on file   Frequency of Social Gatherings with Friends and Family: Not on file   Attends Religious Services: Not on file   Active Member of Clubs or Organizations: Not on file   Attends Archivist Meetings: Not on file   Marital Status: Not on file  Intimate Partner Violence:    Fear of Current or Ex-Partner: Not on file   Emotionally Abused: Not on file   Physically Abused: Not on file   Sexually Abused: Not on file    Physical Exam: General:   Alert,  well-nourished, pleasant and cooperative in NAD Head:  Normocephalic and atraumatic. Eyes:  Sclera clear, no icterus.   Conjunctiva pink. Ears:  Normal auditory  acuity. Nose:  No deformity, discharge,  or lesions. Mouth:  No deformity or lesions.   Neck:  Supple; no masses or thyromegaly. Lungs:  Clear throughout to auscultation.   No wheezes. Heart:  Regular rate and rhythm; no murmurs. Abdomen:  Soft, nontender, nondistended, normal bowel sounds, no rebound or guarding. No hepatosplenomegaly.   Neurologic:  Alert and  oriented x4;  grossly nonfocal Skin:  Intact without significant lesions or rashes. Psych:  Alert and cooperative. Normal mood and affect.     Lauriel Helin L. Tarri Glenn, MD, MPH 05/21/2020, 2:36 PM

## 2020-05-21 NOTE — Patient Instructions (Addendum)
I did not change your medications today. However, you can take the pantoprazole and famotidine at the same time every day. You could use the Carafate up to four times daily as needed.   PRESCRIPTION MEDICATION(S): We have sent the following medication(s) to your pharmacy:  . Famotidine - please continue to take 20mg  by mouth twice daily as needed . Pantoprazole - please continue to take 40mg  by mouth twice daily . Carafate - please continue to take 1g by mouth four times per day as needed  I have recommended an esophagram to further evaluate your swallowing difficulty.   You have been scheduled for a Barium Esophogram at [facility] Radiology (1st floor of the hospital) on 05/29/20 at 11:00am. Please arrive 15 minutes prior to your appointment for registration. Make certain not to have anything to eat or drink 3 hours prior to your test. If you need to reschedule for any reason, please contact radiology at [phone] to do so. __________________________________________________________________ A barium swallow is an examination that concentrates on views of the esophagus. This tends to be a double contrast exam (barium and two liquids which, when combined, create a gas to distend the wall of the oesophagus) or single contrast (non-ionic iodine based). The study is usually tailored to your symptoms so a good history is essential. Attention is paid during the study to the form, structure and configuration of the esophagus, looking for functional disorders (such as aspiration, dysphagia, achalasia, motility and reflux) EXAMINATION You may be asked to change into a gown, depending on the type of swallow being performed. A radiologist and radiographer will perform the procedure. The radiologist will advise you of the type of contrast selected for your procedure and direct you during the exam. You will be asked to stand, sit or lie in several different positions and to hold a small amount of fluid in your  mouth before being asked to swallow while the imaging is performed .In some instances you may be asked to swallow barium coated marshmallows to assess the motility of a solid food bolus. The exam can be recorded as a digital or video fluoroscopy procedure. POST PROCEDURE It will take 1-2 days for the barium to pass through your system. To facilitate this, it is important, unless otherwise directed, to increase your fluids for the next 24-48hrs and to resume your normal diet.  This test typically takes about 30 minutes to perform. __________________________________________________________________________________   Dennis Bast mentioned hemorrhoids during your visit today. I recommend the following:   - Mygihealth.com and UpToDate.com have good information about hemorrhoids. - Use the Anusol HC and apply it to a glycerin suppository when you are having problems. Please do not use them every day for more than two weeks. Please call my if your symptoms are not responding to the Anusol.  - Use Metamucil or a similar stool bulking agent to insure soft, formed stools.  - I gave you a brochure on Hemorrhoids to review.  PRESCRIPTION MEDICATION(S): We have sent the following medication(s) to your pharmacy:  . Anusol suppository - use as directed  Please follow up with me in 3-4 months.  If you are age 54 or younger, your body mass index should be between 19-25. Your There is no height or weight on file to calculate BMI. If this is out of the aformentioned range listed, please consider follow up with your Primary Care Provider.   Thank you for trusting me with your gastrointestinal care!    Thornton Park, MD, MPH  Hemorrhoids Hemorrhoids are swollen veins in and around the rectum or anus. There are two types of hemorrhoids:  Internal hemorrhoids. These occur in the veins that are just inside the rectum. They may poke through to the outside and become irritated and painful.  External hemorrhoids.  These occur in the veins that are outside the anus and can be felt as a painful swelling or hard lump near the anus. Most hemorrhoids do not cause serious problems, and they can be managed with home treatments such as diet and lifestyle changes. If home treatments do not help the symptoms, procedures can be done to shrink or remove the hemorrhoids. What are the causes? This condition is caused by increased pressure in the anal area. This pressure may result from various things, including:  Constipation.  Straining to have a bowel movement.  Diarrhea.  Pregnancy.  Obesity.  Sitting for long periods of time.  Heavy lifting or other activity that causes you to strain.  Anal sex.  Riding a bike for a long period of time. What are the signs or symptoms? Symptoms of this condition include:  Pain.  Anal itching or irritation.  Rectal bleeding.  Leakage of stool (feces).  Anal swelling.  One or more lumps around the anus. How is this diagnosed? This condition can often be diagnosed through a visual exam. Other exams or tests may also be done, such as:  An exam that involves feeling the rectal area with a gloved hand (digital rectal exam).  An exam of the anal canal that is done using a small tube (anoscope).  A blood test, if you have lost a significant amount of blood.  A test to look inside the colon using a flexible tube with a camera on the end (sigmoidoscopy or colonoscopy). How is this treated? This condition can usually be treated at home. However, various procedures may be done if dietary changes, lifestyle changes, and other home treatments do not help your symptoms. These procedures can help make the hemorrhoids smaller or remove them completely. Some of these procedures involve surgery, and others do not. Common procedures include:  Rubber band ligation. Rubber bands are placed at the base of the hemorrhoids to cut off their blood supply.  Sclerotherapy. Medicine  is injected into the hemorrhoids to shrink them.  Infrared coagulation. A type of light energy is used to get rid of the hemorrhoids.  Hemorrhoidectomy surgery. The hemorrhoids are surgically removed, and the veins that supply them are tied off.  Stapled hemorrhoidopexy surgery. The surgeon staples the base of the hemorrhoid to the rectal wall. Follow these instructions at home: Eating and drinking   Eat foods that have a lot of fiber in them, such as whole grains, beans, nuts, fruits, and vegetables.  Ask your health care provider about taking products that have added fiber (fiber supplements).  Reduce the amount of fat in your diet. You can do this by eating low-fat dairy products, eating less red meat, and avoiding processed foods.  Drink enough fluid to keep your urine pale yellow. Managing pain and swelling   Take warm sitz baths for 20 minutes, 3-4 times a day to ease pain and discomfort. You may do this in a bathtub or using a portable sitz bath that fits over the toilet.  If directed, apply ice to the affected area. Using ice packs between sitz baths may be helpful. ? Put ice in a plastic bag. ? Place a towel between your skin and the bag. ?  Leave the ice on for 20 minutes, 2-3 times a day. General instructions  Take over-the-counter and prescription medicines only as told by your health care provider.  Use medicated creams or suppositories as told.  Get regular exercise. Ask your health care provider how much and what kind of exercise is best for you. In general, you should do moderate exercise for at least 30 minutes on most days of the week (150 minutes each week). This can include activities such as walking, biking, or yoga.  Go to the bathroom when you have the urge to have a bowel movement. Do not wait.  Avoid straining to have bowel movements.  Keep the anal area dry and clean. Use wet toilet paper or moist towelettes after a bowel movement.  Do not sit on the  toilet for long periods of time. This increases blood pooling and pain.  Keep all follow-up visits as told by your health care provider. This is important. Contact a health care provider if you have:  Increasing pain and swelling that are not controlled by treatment or medicine.  Difficulty having a bowel movement, or you are unable to have a bowel movement.  Pain or inflammation outside the area of the hemorrhoids. Get help right away if you have:  Uncontrolled bleeding from your rectum. Summary  Hemorrhoids are swollen veins in and around the rectum or anus.  Most hemorrhoids can be managed with home treatments such as diet and lifestyle changes.  Taking warm sitz baths can help ease pain and discomfort.  In severe cases, procedures or surgery can be done to shrink or remove the hemorrhoids. This information is not intended to replace advice given to you by your health care provider. Make sure you discuss any questions you have with your health care provider. Document Revised: 10/29/2018 Document Reviewed: 10/22/2017 Elsevier Patient Education  [place].   High-Fiber Diet Fiber, also called dietary fiber, is a type of carbohydrate that is found in fruits, vegetables, whole grains, and beans. A high-fiber diet can have many health benefits. Your health care provider may recommend a high-fiber diet to help:  Prevent constipation. Fiber can make your bowel movements more regular.  Lower your cholesterol.  Relieve the following conditions: ? Swelling of veins in the anus (hemorrhoids). ? Swelling and irritation (inflammation) of specific areas of the digestive tract (uncomplicated diverticulosis). ? A problem of the large intestine (colon) that sometimes causes pain and diarrhea (irritable bowel syndrome, IBS).  Prevent overeating as part of a weight-loss plan.  Prevent heart disease, type 2 diabetes, and certain cancers. What is my plan? The recommended daily fiber  intake in grams (g) includes:  38 g for men age 31 or younger.  30 g for men over age 63.  59 g for women age 68 or younger.  21 g for women over age 73. You can get the recommended daily intake of dietary fiber by:  Eating a variety of fruits, vegetables, grains, and beans.  Taking a fiber supplement, if it is not possible to get enough fiber through your diet. What do I need to know about a high-fiber diet?  It is better to get fiber through food sources rather than from fiber supplements. There is not a lot of research about how effective supplements are.  Always check the fiber content on the nutrition facts label of any prepackaged food. Look for foods that contain 5 g of fiber or more per serving.  Talk with a diet and  nutrition specialist (dietitian) if you have questions about specific foods that are recommended or not recommended for your medical condition, especially if those foods are not listed below.  Gradually increase how much fiber you consume. If you increase your intake of dietary fiber too quickly, you may have bloating, cramping, or gas.  Drink plenty of water. Water helps you to digest fiber. What are tips for following this plan?  Eat a wide variety of high-fiber foods.  Make sure that half of the grains that you eat each day are whole grains.  Eat breads and cereals that are made with whole-grain flour instead of refined flour or white flour.  Eat brown rice, bulgur wheat, or millet instead of white rice.  Start the day with a breakfast that is high in fiber, such as a cereal that contains 5 g of fiber or more per serving.  Use beans in place of meat in soups, salads, and pasta dishes.  Eat high-fiber snacks, such as berries, raw vegetables, nuts, and popcorn.  Choose whole fruits and vegetables instead of processed forms like juice or sauce. What foods can I eat?  Fruits Berries. Pears. Apples. Oranges. Avocado. Prunes and raisins. Dried  figs. Vegetables Sweet potatoes. Spinach. Kale. Artichokes. Cabbage. Broccoli. Cauliflower. Green peas. Carrots. Squash. Grains Whole-grain breads. Multigrain cereal. Oats and oatmeal. Brown rice. Barley. Bulgur wheat. [place]. Quinoa. Bran muffins. Popcorn. Rye wafer crackers. Meats and other proteins Navy, kidney, and pinto beans. Soybeans. Split peas. Lentils. Nuts and seeds. Dairy Fiber-fortified yogurt. Beverages Fiber-fortified soy milk. Fiber-fortified orange juice. Other foods Fiber bars. The items listed above may not be a complete list of recommended foods and beverages. Contact a dietitian for more options. What foods are not recommended? Fruits Fruit juice. Cooked, strained fruit. Vegetables Fried potatoes. Canned vegetables. Well-cooked vegetables. Grains White bread. Pasta made with refined flour. White rice. Meats and other proteins Fatty cuts of meat. Fried chicken or fried fish. Dairy Milk. Yogurt. Cream cheese. Sour cream. Fats and oils Butters. Beverages Soft drinks. Other foods Cakes and pastries. The items listed above may not be a complete list of foods and beverages to avoid. Contact a dietitian for more information. Summary  Fiber is a type of carbohydrate. It is found in fruits, vegetables, whole grains, and beans.  There are many health benefits of eating a high-fiber diet, such as preventing constipation, lowering blood cholesterol, helping with weight loss, and reducing your risk of heart disease, diabetes, and certain cancers.  Gradually increase your intake of fiber. Increasing too fast can result in cramping, bloating, and gas. Drink plenty of water while you increase your fiber.  The best sources of fiber include whole fruits and vegetables, whole grains, nuts, seeds, and beans. This information is not intended to replace advice given to you by your health care provider. Make sure you discuss any questions you have with your health care  provider. Document Revised: 04/06/2017 Document Reviewed: 04/06/2017 Elsevier Patient Education  2020 [employer].

## 2020-05-29 ENCOUNTER — Ambulatory Visit (HOSPITAL_COMMUNITY): Payer: 59

## 2020-06-01 ENCOUNTER — Encounter: Payer: Self-pay | Admitting: Internal Medicine

## 2020-06-03 NOTE — Telephone Encounter (Signed)
She needs to schedule a 30 min appt to discuss

## 2020-06-04 ENCOUNTER — Other Ambulatory Visit: Payer: Self-pay

## 2020-06-04 ENCOUNTER — Ambulatory Visit (HOSPITAL_COMMUNITY)
Admission: RE | Admit: 2020-06-04 | Discharge: 2020-06-04 | Disposition: A | Discharge status: Home / Self Care | Payer: 59 | Source: Ambulatory Visit | Attending: Gastroenterology | Admitting: Gastroenterology

## 2020-06-04 DIAGNOSIS — R1310 Dysphagia, unspecified: Secondary | ICD-10-CM

## 2020-06-07 ENCOUNTER — Ambulatory Visit: Payer: 59 | Admitting: Gastroenterology

## 2020-06-07 ENCOUNTER — Other Ambulatory Visit: Payer: Self-pay | Admitting: Internal Medicine

## 2020-06-07 DIAGNOSIS — E559 Vitamin D deficiency, unspecified: Secondary | ICD-10-CM

## 2020-06-16 HISTORY — PX: COLONOSCOPY: SHX174

## 2020-06-16 MED ORDER — TRAMADOL HCL 50 MG PO TABS: 50.0000 mg | ORAL_TABLET | Freq: Every day | ORAL | 0 refills | Status: AC | PRN

## 2020-06-16 NOTE — Addendum Note (Signed)
Addended by: Lorre Munroe on: 06/16/2020 10:47 AM   Modules accepted: Orders

## 2020-06-24 ENCOUNTER — Other Ambulatory Visit: Payer: Self-pay | Admitting: Gastroenterology

## 2020-06-24 DIAGNOSIS — R1310 Dysphagia, unspecified: Secondary | ICD-10-CM

## 2020-07-11 ENCOUNTER — Ambulatory Visit: Payer: 59 | Admitting: Gastroenterology

## 2020-07-14 ENCOUNTER — Other Ambulatory Visit: Payer: Self-pay | Admitting: Gastroenterology

## 2020-07-14 DIAGNOSIS — R1310 Dysphagia, unspecified: Secondary | ICD-10-CM

## 2020-08-03 ENCOUNTER — Ambulatory Visit: Payer: 59 | Admitting: Gastroenterology

## 2020-08-07 ENCOUNTER — Other Ambulatory Visit: Payer: Self-pay | Admitting: Internal Medicine

## 2020-08-25 ENCOUNTER — Encounter: Payer: Self-pay | Admitting: Surgery

## 2020-09-10 ENCOUNTER — Ambulatory Visit: Payer: 59 | Admitting: Gastroenterology

## 2020-10-15 ENCOUNTER — Ambulatory Visit: Payer: 59 | Admitting: Gastroenterology

## 2020-10-16 ENCOUNTER — Encounter: Payer: Self-pay | Admitting: Gastroenterology

## 2020-11-15 ENCOUNTER — Other Ambulatory Visit: Payer: Self-pay | Admitting: Sports Medicine

## 2020-11-15 DIAGNOSIS — M25531 Pain in right wrist: Secondary | ICD-10-CM

## 2020-11-16 ENCOUNTER — Encounter: Payer: Self-pay | Admitting: Internal Medicine

## 2020-11-25 ENCOUNTER — Other Ambulatory Visit: Payer: 59

## 2020-11-25 ENCOUNTER — Other Ambulatory Visit: Payer: Self-pay | Admitting: Gastroenterology

## 2020-11-25 DIAGNOSIS — R1310 Dysphagia, unspecified: Secondary | ICD-10-CM

## 2020-12-01 ENCOUNTER — Ambulatory Visit
Admission: RE | Admit: 2020-12-01 | Discharge: 2020-12-01 | Disposition: A | Discharge status: Home / Self Care | Payer: 59 | Source: Ambulatory Visit | Attending: Sports Medicine | Admitting: Sports Medicine

## 2020-12-01 DIAGNOSIS — M25531 Pain in right wrist: Secondary | ICD-10-CM

## 2020-12-04 ENCOUNTER — Ambulatory Visit: Payer: Self-pay | Admitting: Internal Medicine

## 2020-12-12 ENCOUNTER — Encounter: Payer: Self-pay | Admitting: Internal Medicine

## 2020-12-12 DIAGNOSIS — R1310 Dysphagia, unspecified: Secondary | ICD-10-CM

## 2020-12-12 MED ORDER — PANTOPRAZOLE SODIUM 40 MG PO TBEC: 40.0000 mg | DELAYED_RELEASE_TABLET | Freq: Two times a day (BID) | ORAL | 0 refills | Status: DC

## 2020-12-28 ENCOUNTER — Ambulatory Visit: Payer: 59 | Admitting: Internal Medicine

## 2020-12-28 ENCOUNTER — Ambulatory Visit: Payer: Self-pay | Admitting: Internal Medicine

## 2020-12-28 ENCOUNTER — Other Ambulatory Visit: Payer: Self-pay

## 2020-12-28 ENCOUNTER — Encounter: Payer: Self-pay | Admitting: Internal Medicine

## 2020-12-28 VITALS — BP 137/72 | HR 90 | Temp 97.3°F | Resp 17 | Ht 65.5 in | Wt 257.2 lb

## 2020-12-28 DIAGNOSIS — R1310 Dysphagia, unspecified: Secondary | ICD-10-CM

## 2020-12-28 DIAGNOSIS — R519 Headache, unspecified: Secondary | ICD-10-CM

## 2020-12-28 DIAGNOSIS — K219 Gastro-esophageal reflux disease without esophagitis: Secondary | ICD-10-CM | POA: Diagnosis not present

## 2020-12-28 DIAGNOSIS — E781 Pure hyperglyceridemia: Secondary | ICD-10-CM

## 2020-12-28 DIAGNOSIS — F32A Depression, unspecified: Secondary | ICD-10-CM

## 2020-12-28 DIAGNOSIS — F419 Anxiety disorder, unspecified: Secondary | ICD-10-CM

## 2020-12-28 DIAGNOSIS — R7303 Prediabetes: Secondary | ICD-10-CM

## 2020-12-28 DIAGNOSIS — M2550 Pain in unspecified joint: Secondary | ICD-10-CM | POA: Diagnosis not present

## 2020-12-28 MED ORDER — PANTOPRAZOLE SODIUM 40 MG PO TBEC: 40.0000 mg | DELAYED_RELEASE_TABLET | Freq: Two times a day (BID) | ORAL | 5 refills | Status: DC

## 2020-12-28 NOTE — Progress Notes (Signed)
Subjective:    Patient ID: [name], female    DOB: June 04, 1966, 55 y.o.   MRN: [id number]  HPI  Pt presents to the clinic today for follow up of chronic conditions.   Chronic Pain: Mainly in her low back and bilateral knees. Managed with Flexeril, Voltaren Gel and Tramadol. She follows with orthopedics.  Anxiety and Depression: Chronic, managed without medications. She is not currently seeing a therapist. She denies SI/HI.  GERD: She denies breakthrough on Pantoprazole and Famotidine. Upper GI from 03/2020 reviewed.  Prediabetes: Her last A1C was 6.1%, 03/2020. She is not taking any oral diabetic medication at this time. She does not check her sugars.  HLD: Her last LDL was 106, triglycerides 181, 03/2020. She is not taking any cholesterol lowering medications at this time. She tries to consume a low fat diet.  Frequent Headaches: These occur daily. Mainly behind her eye on the right side. She denies dizziness or visual changes. She is not sure what triggers this. She takes Tylenol as needed with some relief.  Review of Systems     Past Medical History:  Diagnosis Date   Allergy    DDD (degenerative disc disease), lumbar    Depression     Current Outpatient Medications  Medication Sig Dispense Refill   albuterol (VENTOLIN HFA) 108 (90 Base) MCG/ACT inhaler Inhale 2 puffs into the lungs every 6 (six) hours as needed for wheezing or shortness of breath. 8 g 0   ALLEGRA-D ALLERGY & CONGESTION 180-240 MG 24 hr tablet TAKE 1 TABLET BY MOUTH EVERY DAY AS NEEDED 30 tablet 1   cyclobenzaprine (FLEXERIL) 10 MG tablet Take 1 tablet (10 mg total) by mouth 3 (three) times daily as needed for muscle spasms. 30 tablet 0   diclofenac Sodium (VOLTAREN) 1 % GEL APPLY 2 GRAMS TO AFFECTED AREA 4 TIMES A DAY 100 g 0   famotidine (PEPCID) 20 MG tablet TAKE 1 TABLET (20 MG TOTAL) BY MOUTH 2 (TWO) TIMES DAILY AS NEEDED FOR HEARTBURN OR INDIGESTION. 180 tablet 1   hydrocortisone  (ANUSOL-HC) 25 MG suppository Place 1 suppository (25 mg total) rectally 2 (two) times daily. 12 suppository 0   hydrOXYzine (ATARAX/VISTARIL) 25 MG tablet TAKE 1 TABLET (25 MG TOTAL) BY MOUTH DAILY AS NEEDED. 90 tablet 0   ondansetron (ZOFRAN ODT) 4 MG disintegrating tablet Take 1 tablet (4 mg total) by mouth every 8 (eight) hours as needed for nausea or vomiting. 50 tablet 0   pantoprazole (PROTONIX) 40 MG tablet Take 1 tablet (40 mg total) by mouth 2 (two) times daily. 60 tablet 0   sucralfate (CARAFATE) 1 g tablet Take 1 tablet (1 g total) by mouth 4 (four) times daily as needed. 120 tablet 0   Vitamin D, Ergocalciferol, (DRISDOL) 1.25 MG (50000 UNIT) CAPS capsule Take 1 capsule (50,000 Units total) by mouth every 7 (seven) days. 12 capsule 0   No current facility-administered medications for this visit.    Allergies  Allergen Reactions   Gabapentin Other (See Comments)    Hair loss   Penicillins Other (See Comments)    Childhood allergy Has patient had a PCN reaction causing immediate rash, facial/tongue/throat swelling, SOB or lightheadedness with hypotension: Unknown Has patient had a PCN reaction causing severe rash involving mucus membranes or skin necrosis: Unknown Has patient had a PCN reaction that required hospitalization: No  Has patient had a PCN reaction occurring within the last 10 years: No  If all of the above answers  are "NO", then may proceed with Cephalosporin use.    Orange Juice [Orange Oil] Rash    rash    Family History  Problem Relation Age of Onset   Hyperlipidemia Mother    Heart disease Maternal Aunt    Stroke Maternal Aunt    Diabetes Maternal Aunt    Heart disease Maternal Uncle    Stroke Maternal Uncle    Cancer Neg Hx     Social History   Socioeconomic History   Marital status: Married    Spouse name: Not on file   Number of children: Not on file   Years of education: Not on file   Highest education level: Not on file  Occupational  History   Not on file  Tobacco Use   Smoking status: Never   Smokeless tobacco: Never  Vaping Use   Vaping Use: Never used  Substance and Sexual Activity   Alcohol use: Yes    Comment: social   Drug use: No   Sexual activity: Yes    Partners: Male    Birth control/protection: Post-menopausal, Surgical  Other Topics Concern   Not on file  Social History Narrative   Not on file   Social Determinants of Health   Financial Resource Strain: Not on file  Food Insecurity: Not on file  Transportation Needs: Not on file  Physical Activity: Not on file  Stress: Not on file  Social Connections: Not on file  Intimate Partner Violence: Not on file     Constitutional: Pt reports fatigue and headaches. Denies fever, malaise, or abrupt weight changes.  HEENT: Denies eye pain, eye redness, ear pain, ringing in the ears, wax buildup, runny nose, nasal congestion, bloody nose, or sore throat. Respiratory: Denies difficulty breathing, shortness of breath, cough or sputum production.   Cardiovascular: Denies chest pain, chest tightness, palpitations or swelling in the hands or feet.  Gastrointestinal: Denies abdominal pain, bloating, constipation, diarrhea or blood in the stool.  GU: Denies urgency, frequency, pain with urination, burning sensation, blood in urine, odor or discharge. Musculoskeletal: Pt reports chronic low back and knee pain. Denies decrease in range of motion, difficulty with gait, or joint swelling.  Skin: Denies redness, rashes, lesions or ulcercations.  Neurological: Denies dizziness, difficulty with memory, difficulty with speech or problems with balance and coordination.  Psych: Pt has a history of anxiety and depression. Denies SI/HI.  No other specific complaints in a complete review of systems (except as listed in HPI above).  Objective:   Physical Exam  BP 137/72 (BP Location: Left Arm, Patient Position: Sitting, Cuff Size: Large)   Pulse 90   Temp (!) 97.3 F  (36.3 C) (Temporal)   Resp 17   Ht 5' 5.5" (1.664 m)   Wt 257 lb 3.2 oz (116.7 kg)   SpO2 98%   BMI 42.15 kg/m   Wt Readings from Last 3 Encounters:  05/21/20 239 lb (108.4 kg)  04/06/20 240 lb (108.9 kg)  03/19/20 240 lb (108.9 kg)    General: Appears her stated age, obese, in NAD. Skin: Warm, dry and intact. No ulcerations noted. HEENT: Head: normal shape and size; Eyes: sclera white and EOMs intact;  Cardiovascular: Normal rate and rhythm. S1,S2 noted.  No murmur, rubs or gallops noted. No JVD or BLE edema. No carotid bruits noted. Pulmonary/Chest: Normal effort and positive vesicular breath sounds. No respiratory distress. No wheezes, rales or ronchi noted.  Abdomen: Soft and nontender. Normal bowel sounds. No distention or masses noted.  Musculoskeletal: No difficulty with gait.  Neurological: Alert and oriented.  Psychiatric: Mood and affect normal. Behavior is normal. Judgment and thought content normal.    BMET    Component Value Date/Time   NA 141 03/19/2020 0918   NA 142 09/18/2016 0929   K 3.6 03/19/2020 0918   CL 107 03/19/2020 0918   CO2 27 03/19/2020 0918   GLUCOSE 126 (H) 03/19/2020 0918   BUN 11 03/19/2020 0918   BUN 9 09/18/2016 0929   CREATININE 0.68 03/19/2020 0918   CALCIUM 9.2 03/19/2020 0918   GFRNONAA >60 12/01/2019 1558   GFRAA >60 12/01/2019 1558    Lipid Panel     Component Value Date/Time   CHOL 190 03/19/2020 0918   CHOL 200 (H) 03/23/2017 0844   TRIG 181.0 (H) 03/19/2020 0918   HDL 47.40 03/19/2020 0918   HDL 57 03/23/2017 0844   CHOLHDL 4 03/19/2020 0918   VLDL 36.2 03/19/2020 0918   LDLCALC 106 (H) 03/19/2020 0918   LDLCALC 124 (H) 03/23/2017 0844    CBC    Component Value Date/Time   WBC 5.0 03/19/2020 0918   RBC 3.88 03/19/2020 0918   HGB 12.6 03/19/2020 0918   HGB 13.2 09/18/2016 0929   HCT 37.2 03/19/2020 0918   HCT 38.1 09/18/2016 0929   PLT 170.0 03/19/2020 0918   PLT 268 09/18/2016 0929   MCV 95.8 03/19/2020  0918   MCV 94 09/18/2016 0929   MCH 31.6 12/01/2019 1558   MCHC 33.8 03/19/2020 0918   RDW 13.2 03/19/2020 0918   RDW 12.8 09/18/2016 0929   LYMPHSABS 2.8 08/30/2019 0315   LYMPHSABS 2.8 08/03/2014 1609   MONOABS 0.6 08/30/2019 0315   EOSABS 0.1 08/30/2019 0315   EOSABS 0.1 08/03/2014 1609   BASOSABS 0.0 08/30/2019 0315   BASOSABS 0.0 08/03/2014 1609    Hgb A1C Lab Results  Component Value Date   HGBA1C 6.1 03/19/2020            Assessment & Plan:    Webb Silversmith, NP This visit occurred during the SARS-CoV-2 public health emergency.  Safety protocols were in place, including screening questions prior to the visit, additional usage of staff PPE, and extensive cleaning of exam room while observing appropriate contact time as indicated for disinfecting solutions.

## 2020-12-29 ENCOUNTER — Encounter: Payer: Self-pay | Admitting: Internal Medicine

## 2020-12-29 LAB — HEMOGLOBIN A1C
Hgb A1c MFr Bld: 6 % of total Hgb — ABNORMAL HIGH (ref <5.7)
Mean Plasma Glucose: 126 mg/dL
eAG (mmol/L): 7 mmol/L

## 2020-12-29 LAB — LIPID PANEL
Cholesterol: 245 mg/dL — ABNORMAL HIGH (ref <200)
HDL: 83 mg/dL (ref >=50)
LDL Cholesterol (Calc): 144 mg/dL (calc) — ABNORMAL HIGH
Non-HDL Cholesterol (Calc): 162 mg/dL (calc) — ABNORMAL HIGH (ref <130)
Total CHOL/HDL Ratio: 3 (calc) (ref <5.0)
Triglycerides: 74 mg/dL (ref <150)

## 2020-12-31 DIAGNOSIS — E66812 Obesity, class 2: Secondary | ICD-10-CM | POA: Insufficient documentation

## 2020-12-31 DIAGNOSIS — E1169 Type 2 diabetes mellitus with other specified complication: Secondary | ICD-10-CM | POA: Insufficient documentation

## 2020-12-31 DIAGNOSIS — E785 Hyperlipidemia, unspecified: Secondary | ICD-10-CM | POA: Insufficient documentation

## 2020-12-31 DIAGNOSIS — R7303 Prediabetes: Secondary | ICD-10-CM | POA: Insufficient documentation

## 2020-12-31 DIAGNOSIS — E6609 Other obesity due to excess calories: Secondary | ICD-10-CM | POA: Insufficient documentation

## 2020-12-31 DIAGNOSIS — E66813 Obesity, class 3: Secondary | ICD-10-CM | POA: Insufficient documentation

## 2020-12-31 DIAGNOSIS — R519 Headache, unspecified: Secondary | ICD-10-CM | POA: Insufficient documentation

## 2020-12-31 NOTE — Assessment & Plan Note (Signed)
Encouraged weight loss as this can help reduce joint pain. Continue Flexeril, Voltaren Gel and Tramadol as needed

## 2020-12-31 NOTE — Assessment & Plan Note (Signed)
Encouraged weight loss as this can help reduce reflux symptoms Continue Pantoprazole and Famotidine CBC and CMET today

## 2020-12-31 NOTE — Assessment & Plan Note (Signed)
No issues off meds Support offered

## 2020-12-31 NOTE — Assessment & Plan Note (Signed)
Continue Tylenol OTC as needed Avoid medication overuse as this can lead to rebound headaches

## 2020-12-31 NOTE — Assessment & Plan Note (Signed)
Encouraged diet and exercise for weight loss ?

## 2020-12-31 NOTE — Patient Instructions (Signed)
Heart-Healthy Eating Plan Heart-healthy meal planning includes: Eating less unhealthy fats. Eating more healthy fats. Making other changes in your diet. Talk with your doctor or a diet specialist (dietitian) to create an eating plan that is right for you. What is my plan? Your doctor may recommend an eating plan that includes: Total fat: ______% or less of total calories a day. Saturated fat: ______% or less of total calories a day. Cholesterol: less than _________mg a day. What are tips for following this plan? Cooking Avoid frying your food. Try to bake, boil, grill, or broil it instead. You can also reduce fat by: Removing the skin from poultry. Removing all visible fats from meats. Steaming vegetables in water or broth. Meal planning  At meals, divide your plate into four equal parts: Fill one-half of your plate with vegetables and green salads. Fill one-fourth of your plate with whole grains. Fill one-fourth of your plate with lean protein foods. Eat 4-5 servings of vegetables per day. A serving of vegetables is: 1 cup of raw or cooked vegetables. 2 cups of raw leafy greens. Eat 4-5 servings of fruit per day. A serving of fruit is: 1 medium whole fruit.  cup of dried fruit.  cup of fresh, frozen, or canned fruit.  cup of 100% fruit juice. Eat more foods that have soluble fiber. These are apples, broccoli, carrots, beans, peas, and barley. Try to get 20-30 g of fiber per day. Eat 4-5 servings of nuts, legumes, and seeds per week: 1 serving of dried beans or legumes equals  cup after being cooked. 1 serving of nuts is  cup. 1 serving of seeds equals 1 tablespoon.  General information Eat more home-cooked food. Eat less restaurant, buffet, and fast food. Limit or avoid alcohol. Limit foods that are high in starch and sugar. Avoid fried foods. Lose weight if you are overweight. Keep track of how much salt (sodium) you eat. This is important if you have high blood  pressure. Ask your doctor to tell you more about this. Try to add vegetarian meals each week. Fats Choose healthy fats. These include olive oil and canola oil, flaxseeds, walnuts, almonds, and seeds. Eat more omega-3 fats. These include salmon, mackerel, sardines, tuna, flaxseed oil, and ground flaxseeds. Try to eat fish at least 2 times each week. Check food labels. Avoid foods with trans fats or high amounts of saturated fat. Limit saturated fats. These are often found in animal products, such as meats, butter, and cream. These are also found in plant foods, such as palm oil, palm kernel oil, and coconut oil. Avoid foods with partially hydrogenated oils in them. These have trans fats. Examples are stick margarine, some tub margarines, cookies, crackers, and other baked goods. What foods can I eat? Fruits All fresh, canned (in natural juice), or frozen fruits. Vegetables Fresh or frozen vegetables (raw, steamed, roasted, or grilled). Green salads. Grains Most grains. Choose whole wheat and whole grains most of the time. Rice andpasta, including brown rice and pastas made with whole wheat. Meats and other proteins Lean, well-trimmed beef, veal, pork, and lamb. Chicken and turkey without skin. All fish and shellfish. Wild duck, rabbit, pheasant, and venison. Egg whites or low-cholesterol egg substitutes. Dried beans, peas, lentils, and tofu. Seedsand most nuts. Dairy Low-fat or nonfat cheeses, including ricotta and mozzarella. Skim or 1% milk that is liquid, powdered, or evaporated. Buttermilk that is made with low-fatmilk. Nonfat or low-fat yogurt. Fats and oils Non-hydrogenated (trans-free) margarines. Vegetable oils, including soybean, sesame,   sunflower, olive, peanut, safflower, corn, canola, and cottonseed. Salad dressings or mayonnaisemade with a vegetable oil. Beverages Mineral water. Coffee and tea. Diet carbonated beverages. Sweets and desserts Sherbet, gelatin, and fruit ice. Small  amounts of dark chocolate. Limit all sweets and desserts. Seasonings and condiments All seasonings and condiments. The items listed above may not be a complete list of foods and drinks you can eat. Contact a dietitian for more options. What foods should I avoid? Fruits Canned fruit in heavy syrup. Fruit in cream or butter sauce. Fried fruit. Limitcoconut. Vegetables Vegetables cooked in cheese, cream, or butter sauce. Fried vegetables. Grains Breads that are made with saturated or trans fats, oils, or whole milk. Croissants. Sweet rolls. Donuts. High-fat crackers,such as cheese crackers. Meats and other proteins Fatty meats, such as hot dogs, ribs, sausage, bacon, rib-eye roast or steak. High-fat deli meats, such as salami and bologna. Caviar. Domestic duck andgoose. Organ meats, such as liver. Dairy Cream, sour cream, cream cheese, and creamed cottage cheese. Whole-milk cheeses. Whole or 2% milk that is liquid, evaporated, or condensed. Whole buttermilk. Cream sauce or high-fat cheese sauce. Yogurt that is made fromwhole milk. Fats and oils Meat fat, or shortening. Cocoa butter, hydrogenated oils, palm oil, coconut oil, palm kernel oil. Solid fats and shortenings, including bacon fat, salt pork, lard, and butter. Nondairy cream substitutes. Salad dressings with cheeseor sour cream. Beverages Regular sodas and juice drinks with added sugar. Sweets and desserts Frosting. Pudding. Cookies. Cakes. Pies. Milk chocolate or white chocolate.Buttered syrups. Full-fat ice cream or ice cream drinks. The items listed above may not be a complete list of foods and drinks to avoid. Contact a dietitian for more information. Summary Heart-healthy meal planning includes eating less unhealthy fats, eating more healthy fats, and making other changes in your diet. Eat a balanced diet. This includes fruits and vegetables, low-fat or nonfat dairy, lean protein, nuts and legumes, whole grains, and heart-healthy  oils and fats. This information is not intended to replace advice given to you by your health care provider. Make sure you discuss any questions you have with your healthcare provider. Document Revised: 08/06/2017 Document Reviewed: 07/10/2017 Elsevier Patient Education  2022 Elsevier Inc.  

## 2020-12-31 NOTE — Assessment & Plan Note (Signed)
A1C today Encouraged low carb diet and exercise for weight loss 

## 2020-12-31 NOTE — Assessment & Plan Note (Signed)
Lipid profile today Encouraged her to consume a low fat diet

## 2021-01-18 ENCOUNTER — Ambulatory Visit: Payer: 59 | Admitting: Gastroenterology

## 2021-02-06 ENCOUNTER — Encounter: Payer: Self-pay | Admitting: Internal Medicine

## 2021-02-07 NOTE — Telephone Encounter (Signed)
Yes please have her to schedule a virtual appointment to discuss her concerns and for form completion

## 2021-02-12 ENCOUNTER — Encounter: Payer: Self-pay | Admitting: Internal Medicine

## 2021-02-12 ENCOUNTER — Other Ambulatory Visit: Payer: Self-pay

## 2021-02-12 ENCOUNTER — Telehealth: Payer: 59 | Admitting: Internal Medicine

## 2021-02-12 DIAGNOSIS — F419 Anxiety disorder, unspecified: Secondary | ICD-10-CM | POA: Diagnosis not present

## 2021-02-12 DIAGNOSIS — F32A Depression, unspecified: Secondary | ICD-10-CM

## 2021-02-12 DIAGNOSIS — Z0289 Encounter for other administrative examinations: Secondary | ICD-10-CM

## 2021-02-12 DIAGNOSIS — F5102 Adjustment insomnia: Secondary | ICD-10-CM | POA: Diagnosis not present

## 2021-02-12 DIAGNOSIS — Z636 Dependent relative needing care at home: Secondary | ICD-10-CM | POA: Diagnosis not present

## 2021-02-12 MED ORDER — TRAZODONE HCL 50 MG PO TABS: 25.0000 mg | ORAL_TABLET | Freq: Every evening | ORAL | 0 refills | Status: DC | PRN

## 2021-02-13 ENCOUNTER — Ambulatory Visit: Payer: 59 | Admitting: Gastroenterology

## 2021-02-14 ENCOUNTER — Encounter: Payer: Self-pay | Admitting: Internal Medicine

## 2021-02-14 DIAGNOSIS — G4700 Insomnia, unspecified: Secondary | ICD-10-CM | POA: Insufficient documentation

## 2021-02-14 DIAGNOSIS — Z636 Dependent relative needing care at home: Secondary | ICD-10-CM | POA: Insufficient documentation

## 2021-02-14 NOTE — Assessment & Plan Note (Signed)
Persistent but not currently taking any medications at this time Support offered Will fill out FMLA forms once she sends them to me for completion

## 2021-02-14 NOTE — Patient Instructions (Signed)
Insomnia Insomnia is a sleep disorder that makes it difficult to fall asleep or stay asleep. Insomnia can cause fatigue, low energy, difficulty concentrating, moodswings, and poor performance at work or school. There are three different ways to classify insomnia: Difficulty falling asleep. Difficulty staying asleep. Waking up too early in the morning. Any type of insomnia can be long-term (chronic) or short-term (acute). Both are common. Short-term insomnia usually lasts for three months or less. Chronic insomnia occurs at least three times a week for longer than threemonths. What are the causes? Insomnia may be caused by another condition, situation, or substance, such as: Anxiety. Certain medicines. Gastroesophageal reflux disease (GERD) or other gastrointestinal conditions. Asthma or other breathing conditions. Restless legs syndrome, sleep apnea, or other sleep disorders. Chronic pain. Menopause. Stroke. Abuse of alcohol, tobacco, or illegal drugs. Mental health conditions, such as depression. Caffeine. Neurological disorders, such as Alzheimer's disease. An overactive thyroid (hyperthyroidism). Sometimes, the cause of insomnia may not be known. What increases the risk? Risk factors for insomnia include: Gender. Women are affected more often than men. Age. Insomnia is more common as you get older. Stress. Lack of exercise. Irregular work schedule or working night shifts. Traveling between different time zones. Certain medical and mental health conditions. What are the signs or symptoms? If you have insomnia, the main symptom is having trouble falling asleep or having trouble staying asleep. This may lead to other symptoms, such as: Feeling fatigued or having low energy. Feeling nervous about going to sleep. Not feeling rested in the morning. Having trouble concentrating. Feeling irritable, anxious, or depressed. How is this diagnosed? This condition may be diagnosed based  on: Your symptoms and medical history. Your health care provider may ask about: Your sleep habits. Any medical conditions you have. Your mental health. A physical exam. How is this treated? Treatment for insomnia depends on the cause. Treatment may focus on treating an underlying condition that is causing insomnia. Treatment may also include: Medicines to help you sleep. Counseling or therapy. Lifestyle adjustments to help you sleep better. Follow these instructions at home: Eating and drinking  Limit or avoid alcohol, caffeinated beverages, and cigarettes, especially close to bedtime. These can disrupt your sleep. Do not eat a large meal or eat spicy foods right before bedtime. This can lead to digestive discomfort that can make it hard for you to sleep.  Sleep habits  Keep a sleep diary to help you and your health care provider figure out what could be causing your insomnia. Write down: When you sleep. When you wake up during the night. How well you sleep. How rested you feel the next day. Any side effects of medicines you are taking. What you eat and drink. Make your bedroom a dark, comfortable place where it is easy to fall asleep. Put up shades or blackout curtains to block light from outside. Use a white noise machine to block noise. Keep the temperature cool. Limit screen use before bedtime. This includes: Watching TV. Using your smartphone, tablet, or computer. Stick to a routine that includes going to bed and waking up at the same times every day and night. This can help you fall asleep faster. Consider making a quiet activity, such as reading, part of your nighttime routine. Try to avoid taking naps during the day so that you sleep better at night. Get out of bed if you are still awake after 15 minutes of trying to sleep. Keep the lights down, but try reading or doing a quiet   activity. When you feel sleepy, go back to bed.  General instructions Take over-the-counter  and prescription medicines only as told by your health care provider. Exercise regularly, as told by your health care provider. Avoid exercise starting several hours before bedtime. Use relaxation techniques to manage stress. Ask your health care provider to suggest some techniques that may work well for you. These may include: Breathing exercises. Routines to release muscle tension. Visualizing peaceful scenes. Make sure that you drive carefully. Avoid driving if you feel very sleepy. Keep all follow-up visits as told by your health care provider. This is important. Contact a health care provider if: You are tired throughout the day. You have trouble in your daily routine due to sleepiness. You continue to have sleep problems, or your sleep problems get worse. Get help right away if: You have serious thoughts about hurting yourself or someone else. If you ever feel like you may hurt yourself or others, or have thoughts about taking your own life, get help right away. You can go to your nearest emergency department or call: Your local emergency services (911 in the [place] A suicide crisis helpline, such as the National Suicide Prevention Lifeline at 1-800-273-8255. This is open 24 hours a day. Summary Insomnia is a sleep disorder that makes it difficult to fall asleep or stay asleep. Insomnia can be long-term (chronic) or short-term (acute). Treatment for insomnia depends on the cause. Treatment may focus on treating an underlying condition that is causing insomnia. Keep a sleep diary to help you and your health care provider figure out what could be causing your insomnia. This information is not intended to replace advice given to you by your health care provider. Make sure you discuss any questions you have with your healthcare provider. Document Revised: 04/12/2020 Document Reviewed: 04/12/2020 Elsevier Patient Education  2022 Elsevier Inc.  

## 2021-02-14 NOTE — Assessment & Plan Note (Signed)
Support offered Will fill out FMLA forms once they are available

## 2021-02-14 NOTE — Progress Notes (Signed)
Virtual Visit via Video Note  I connected with [name] on 02/14/21 at 11:20 AM EDT by a video enabled telemedicine application and verified that I am speaking with the correct person using two identifiers.  Location: Patient: Home Provider: Office  Person's participating in this video call: Webb Silversmith, NP-C and Floye Schnaidt   I discussed the limitations of evaluation and management by telemedicine and the availability of in person appointments. The patient expressed understanding and agreed to proceed.  History of Present Illness:  Pt would like FMLA forms completed. She reports her husband was recently diagnosed with prostate cancer. She is very concerned about his health and the financial impact this will have on her family. She quit her second job months back secondary to stress and reports with caring for him and her grandaughter, she is unable to get a second job at this time. She has ongoing anxiety and depression as well that seems worse since her husbands diagnosis. She is having a hard time sleeping. She is not currently taking any medications for this.   Past Medical History:  Diagnosis Date   Allergy    DDD (degenerative disc disease), lumbar    Depression     Current Outpatient Medications  Medication Sig Dispense Refill   albuterol (VENTOLIN HFA) 108 (90 Base) MCG/ACT inhaler Inhale 2 puffs into the lungs every 6 (six) hours as needed for wheezing or shortness of breath. 8 g 0   ALLEGRA-D ALLERGY & CONGESTION 180-240 MG 24 hr tablet TAKE 1 TABLET BY MOUTH EVERY DAY AS NEEDED 30 tablet 1   diclofenac Sodium (VOLTAREN) 1 % GEL APPLY 2 GRAMS TO AFFECTED AREA 4 TIMES A DAY 100 g 0   famotidine (PEPCID) 20 MG tablet TAKE 1 TABLET (20 MG TOTAL) BY MOUTH 2 (TWO) TIMES DAILY AS NEEDED FOR HEARTBURN OR INDIGESTION. (Patient taking differently: Take 20 mg by mouth 2 (two) times daily.) 180 tablet 1   pantoprazole (PROTONIX) 40 MG tablet Take 1 tablet (40 mg total) by  mouth 2 (two) times daily. 60 tablet 5   traZODone (DESYREL) 50 MG tablet Take 0.5-1 tablets (25-50 mg total) by mouth at bedtime as needed for sleep. 30 tablet 0   No current facility-administered medications for this visit.    Allergies  Allergen Reactions   Gabapentin Other (See Comments)    Hair loss   Penicillins Other (See Comments)    Childhood allergy Has patient had a PCN reaction causing immediate rash, facial/tongue/throat swelling, SOB or lightheadedness with hypotension: Unknown Has patient had a PCN reaction causing severe rash involving mucus membranes or skin necrosis: Unknown Has patient had a PCN reaction that required hospitalization: No  Has patient had a PCN reaction occurring within the last 10 years: No  If all of the above answers are "NO", then may proceed with Cephalosporin use.    Orange Juice [Orange Oil] Rash    rash    Family History  Problem Relation Age of Onset   Hyperlipidemia Mother    Heart disease Maternal Aunt    Stroke Maternal Aunt    Diabetes Maternal Aunt    Heart disease Maternal Uncle    Stroke Maternal Uncle    Cancer Neg Hx     Social History   Socioeconomic History   Marital status: Married    Spouse name: Not on file   Number of children: Not on file   Years of education: Not on file   Highest education level: Not  on file  Occupational History   Not on file  Tobacco Use   Smoking status: Never   Smokeless tobacco: Never  Vaping Use   Vaping Use: Never used  Substance and Sexual Activity   Alcohol use: Yes    Comment: social   Drug use: No   Sexual activity: Yes    Partners: Male    Birth control/protection: Post-menopausal, Surgical  Other Topics Concern   Not on file  Social History Narrative   Not on file   Social Determinants of Health   Financial Resource Strain: Not on file  Food Insecurity: Not on file  Transportation Needs: Not on file  Physical Activity: Not on file  Stress: Not on file  Social  Connections: Not on file  Intimate Partner Violence: Not on file     Constitutional: Pt reports fatigue. Denies fever, malaise, headache or abrupt weight changes.  Respiratory: Denies difficulty breathing, shortness of breath, cough or sputum production.   Cardiovascular: Denies chest pain, chest tightness, palpitations or swelling in the hands or feet.  Neurological: Pt reports insomnia. Denies dizziness, difficulty with memory, difficulty with speech or problems with balance and coordination.  Psych: Pt has a history of anxiety and depression. Denies SI/HI.  No other specific complaints in a complete review of systems (except as listed in HPI above).    Observations/Objective:   Wt Readings from Last 3 Encounters:  12/28/20 257 lb 3.2 oz (116.7 kg)  05/21/20 239 lb (108.4 kg)  04/06/20 240 lb (108.9 kg)    General: Appears her stated age, obese, in NAD. Pulmonary/Chest: Normal effort. No respiratory distress.  Neurological: Alert and oriented.  Psychiatric: Anxious appearing and tearful at times.   BMET    Component Value Date/Time   NA 141 03/19/2020 0918   NA 142 09/18/2016 0929   K 3.6 03/19/2020 0918   CL 107 03/19/2020 0918   CO2 27 03/19/2020 0918   GLUCOSE 126 (H) 03/19/2020 0918   BUN 11 03/19/2020 0918   BUN 9 09/18/2016 0929   CREATININE 0.68 03/19/2020 0918   CALCIUM 9.2 03/19/2020 0918   GFRNONAA >60 12/01/2019 1558   GFRAA >60 12/01/2019 1558    Lipid Panel     Component Value Date/Time   CHOL 245 (H) 12/28/2020 1528   CHOL 200 (H) 03/23/2017 0844   TRIG 74 12/28/2020 1528   HDL 83 12/28/2020 1528   HDL 57 03/23/2017 0844   CHOLHDL 3.0 12/28/2020 1528   VLDL 36.2 03/19/2020 0918   LDLCALC 144 (H) 12/28/2020 1528    CBC    Component Value Date/Time   WBC 5.0 03/19/2020 0918   RBC 3.88 03/19/2020 0918   HGB 12.6 03/19/2020 0918   HGB 13.2 09/18/2016 0929   HCT 37.2 03/19/2020 0918   HCT 38.1 09/18/2016 0929   PLT 170.0 03/19/2020 0918    PLT 268 09/18/2016 0929   MCV 95.8 03/19/2020 0918   MCV 94 09/18/2016 0929   MCH 31.6 12/01/2019 1558   MCHC 33.8 03/19/2020 0918   RDW 13.2 03/19/2020 0918   RDW 12.8 09/18/2016 0929   LYMPHSABS 2.8 08/30/2019 0315   LYMPHSABS 2.8 08/03/2014 1609   MONOABS 0.6 08/30/2019 0315   EOSABS 0.1 08/30/2019 0315   EOSABS 0.1 08/03/2014 1609   BASOSABS 0.0 08/30/2019 0315   BASOSABS 0.0 08/03/2014 1609    Hgb A1C Lab Results  Component Value Date   HGBA1C 6.0 (H) 12/28/2020       Assessment and Plan:  Follow Up Instructions:    I discussed the assessment and treatment plan with the patient. The patient was provided an opportunity to ask questions and all were answered. The patient agreed with the plan and demonstrated an understanding of the instructions.   The patient was advised to call back or seek an in-person evaluation if the symptoms worsen or if the condition fails to improve as anticipated.    Webb Silversmith, NP

## 2021-02-14 NOTE — Assessment & Plan Note (Signed)
Will trial Trazadone 25-50 mg PO QHS prn

## 2021-03-06 ENCOUNTER — Other Ambulatory Visit: Payer: Self-pay | Admitting: Internal Medicine

## 2021-03-06 ENCOUNTER — Encounter: Payer: Self-pay | Admitting: Internal Medicine

## 2021-03-06 NOTE — Telephone Encounter (Signed)
Requested medication (s) are due for refill today:   Yes  Requested medication (s) are on the active medication list:   Yes  Future visit scheduled:   Yes for 03/28/2021   Last ordered: 02/12/2021 #30, 0 refills  Returned because pharmacy requesting a 90 day supply   Requested Prescriptions  Pending Prescriptions Disp Refills   traZODone (DESYREL) 50 MG tablet [Pharmacy Med Name: TRAZODONE 50 MG TABLET] 90 tablet 1    Sig: TAKE 0.5-1 TABLETS BY MOUTH AT BEDTIME AS NEEDED FOR SLEEP.     Psychiatry: Antidepressants - Serotonin Modulator Passed - 03/06/2021  2:32 PM      Passed - Completed PHQ-2 or PHQ-9 in the last 360 days      Passed - Valid encounter within last 6 months    Recent Outpatient Visits           3 weeks ago Adjustment insomnia   [facility] [place], Coralie Keens, NP   2 months ago Arthralgia of multiple joints   [facility] [place], Coralie Keens, NP       Future Appointments             In 3 weeks Garnette Gunner, Coralie Keens, NP [facility], [facility]

## 2021-03-20 ENCOUNTER — Ambulatory Visit: Payer: 59 | Admitting: Gastroenterology

## 2021-03-21 ENCOUNTER — Other Ambulatory Visit: Payer: Self-pay | Admitting: Internal Medicine

## 2021-03-21 NOTE — Telephone Encounter (Signed)
Call to patient- patient states the medication is really not helping her- even at 2 pills/night. Patient also states she did not pick up Rx in 03/07/21. Patient has upcoming appointment and wants to talk to provider about options. Rx increase refused per patient

## 2021-03-28 ENCOUNTER — Encounter: Payer: Self-pay | Admitting: Internal Medicine

## 2021-03-28 ENCOUNTER — Ambulatory Visit (INDEPENDENT_AMBULATORY_CARE_PROVIDER_SITE_OTHER): Payer: 59 | Admitting: Internal Medicine

## 2021-03-28 ENCOUNTER — Other Ambulatory Visit: Payer: Self-pay

## 2021-03-28 VITALS — BP 146/80 | HR 89 | Temp 97.5°F | Resp 18 | Ht 65.5 in | Wt 264.6 lb

## 2021-03-28 DIAGNOSIS — Z1159 Encounter for screening for other viral diseases: Secondary | ICD-10-CM | POA: Diagnosis not present

## 2021-03-28 DIAGNOSIS — Z1231 Encounter for screening mammogram for malignant neoplasm of breast: Secondary | ICD-10-CM

## 2021-03-28 DIAGNOSIS — Z0001 Encounter for general adult medical examination with abnormal findings: Secondary | ICD-10-CM | POA: Diagnosis not present

## 2021-03-28 DIAGNOSIS — F5102 Adjustment insomnia: Secondary | ICD-10-CM | POA: Diagnosis not present

## 2021-03-28 DIAGNOSIS — R7303 Prediabetes: Secondary | ICD-10-CM | POA: Diagnosis not present

## 2021-03-28 DIAGNOSIS — Z23 Encounter for immunization: Secondary | ICD-10-CM | POA: Diagnosis not present

## 2021-03-28 MED ORDER — ZOLPIDEM TARTRATE 5 MG PO TABS: 5.0000 mg | ORAL_TABLET | Freq: Every evening | ORAL | 0 refills | Status: DC | PRN

## 2021-03-28 NOTE — Assessment & Plan Note (Signed)
Failed Trazodone We will trial Ambien

## 2021-03-28 NOTE — Assessment & Plan Note (Signed)
A1c today.  

## 2021-03-28 NOTE — Patient Instructions (Signed)

## 2021-03-28 NOTE — Assessment & Plan Note (Signed)
Encourage diet and exercise for weight loss 

## 2021-03-28 NOTE — Progress Notes (Signed)
Subjective:    Patient ID: [name], female    DOB: February 10, 1966, 55 y.o.   MRN: [id number]  HPI  Patient presents the clinic today for her annual exam.  She also wants to follow-up insomnia.  She reports the trazodone is not working.  She is able to fall asleep but unable to stay asleep.  She also reports she pulled a muscle in her right upper back today.  Flu: 03/2020 Tetanus: 09/2016 COVID: [facility] x2 Shingrix: Never Pap smear: 03/2013, hysterectomy Mammogram: 06/2019 Colon screening: 03/2020 Vision screening: annually Dentist: biannually  Diet: She does eat lean meat. She consumes fruits and veggies. She tries to avoid fried foods. She drinks mostly water, occasional coffee. Exercise: None  Review of Systems  Past Medical History:  Diagnosis Date   Allergy    DDD (degenerative disc disease), lumbar    Depression     Current Outpatient Medications  Medication Sig Dispense Refill   ALLEGRA-D ALLERGY & CONGESTION 180-240 MG 24 hr tablet TAKE 1 TABLET BY MOUTH EVERY DAY AS NEEDED 30 tablet 1   diclofenac Sodium (VOLTAREN) 1 % GEL APPLY 2 GRAMS TO AFFECTED AREA 4 TIMES A DAY 100 g 0   famotidine (PEPCID) 20 MG tablet TAKE 1 TABLET (20 MG TOTAL) BY MOUTH 2 (TWO) TIMES DAILY AS NEEDED FOR HEARTBURN OR INDIGESTION. (Patient taking differently: Take 20 mg by mouth 2 (two) times daily.) 180 tablet 1   pantoprazole (PROTONIX) 40 MG tablet Take 1 tablet (40 mg total) by mouth 2 (two) times daily. 60 tablet 5   traZODone (DESYREL) 50 MG tablet Take 1-2 tablets (50-100 mg total) by mouth at bedtime as needed for sleep. 90 tablet 0   No current facility-administered medications for this visit.    Allergies  Allergen Reactions   Gabapentin Other (See Comments)    Hair loss   Hydrochlorothiazide Other (See Comments)    The pt state she couldn't function and felt like she was dying    Penicillins Other (See Comments)    Childhood allergy Has patient had a PCN reaction  causing immediate rash, facial/tongue/throat swelling, SOB or lightheadedness with hypotension: Unknown Has patient had a PCN reaction causing severe rash involving mucus membranes or skin necrosis: Unknown Has patient had a PCN reaction that required hospitalization: No  Has patient had a PCN reaction occurring within the last 10 years: No  If all of the above answers are "NO", then may proceed with Cephalosporin use.    Orange Juice [Orange Oil] Rash    rash    Family History  Problem Relation Age of Onset   Hyperlipidemia Mother    Heart disease Maternal Aunt    Stroke Maternal Aunt    Diabetes Maternal Aunt    Heart disease Maternal Uncle    Stroke Maternal Uncle    Cancer Neg Hx     Social History   Socioeconomic History   Marital status: Married    Spouse name: Not on file   Number of children: Not on file   Years of education: Not on file   Highest education level: Not on file  Occupational History   Not on file  Tobacco Use   Smoking status: Never   Smokeless tobacco: Never  Vaping Use   Vaping Use: Never used  Substance and Sexual Activity   Alcohol use: Yes    Comment: social   Drug use: No   Sexual activity: Yes    Partners: Male  Birth control/protection: Post-menopausal, Surgical  Other Topics Concern   Not on file  Social History Narrative   Not on file   Social Determinants of Health   Financial Resource Strain: Not on file  Food Insecurity: Not on file  Transportation Needs: Not on file  Physical Activity: Not on file  Stress: Not on file  Social Connections: Not on file  Intimate Partner Violence: Not on file     Constitutional: Patient reports headaches and fatigue.  Denies fever, malaise or abrupt weight changes.  HEENT: Denies eye pain, eye redness, ear pain, ringing in the ears, wax buildup, runny nose, nasal congestion, bloody nose, or sore throat. Respiratory: Denies difficulty breathing, shortness of breath, cough or sputum  production.   Cardiovascular: Denies chest pain, chest tightness, palpitations or swelling in the hands or feet.  Gastrointestinal: Denies abdominal pain, bloating, constipation, diarrhea or blood in the stool.  GU: Denies urgency, frequency, pain with urination, burning sensation, blood in urine, odor or discharge. Musculoskeletal: Patient reports joint pain, right upper back pain.  Denies decrease in range of motion, difficulty with gait, or joint swelling.  Skin: Denies redness, rashes, lesions or ulcercations.  Neurological: Patient reports insomnia.  Denies dizziness, difficulty with memory, difficulty with speech or problems with balance and coordination.  Psych: Patient has a history of anxiety and depression.  Denies SI/HI.  No other specific complaints in a complete review of systems (except as listed in HPI above).     Objective:   Physical Exam BP (!) 146/80 (BP Location: Right Arm, Patient Position: Sitting, Cuff Size: Large)   Pulse 89   Temp (!) 97.5 F (36.4 C) (Temporal)   Resp 18   Ht 5' 5.5" (1.664 m)   Wt 264 lb 9.6 oz (120 kg)   SpO2 99%   BMI 43.36 kg/m   Wt Readings from Last 3 Encounters:  12/28/20 257 lb 3.2 oz (116.7 kg)  05/21/20 239 lb (108.4 kg)  04/06/20 240 lb (108.9 kg)    General: Appears her stated age, obese, in NAD. Skin: Warm, dry and intact.  HEENT: Head: normal shape and size; Eyes: EOMs intact;  Neck:  Neck supple, trachea midline. No masses, lumps or thyromegaly present.  Cardiovascular: Normal rate and rhythm. S1,S2 noted.  No murmur, rubs or gallops noted. No JVD or BLE edema. No carotid bruits noted. Pulmonary/Chest: Normal effort and positive vesicular breath sounds. No respiratory distress. No wheezes, rales or ronchi noted.  Abdomen: Soft and nontender. Normal bowel sounds. No distention or masses noted. Liver, spleen and kidneys non palpable. Musculoskeletal: Strength 5/5 BUE/BLE.  No difficulty with gait.  Neurological: Alert and  oriented. Cranial nerves II-XII grossly intact. Coordination normal.  Psychiatric: Mood and affect mildly flat. Behavior is normal. Judgment and thought content normal.     BMET    Component Value Date/Time   NA 141 03/19/2020 0918   NA 142 09/18/2016 0929   K 3.6 03/19/2020 0918   CL 107 03/19/2020 0918   CO2 27 03/19/2020 0918   GLUCOSE 126 (H) 03/19/2020 0918   BUN 11 03/19/2020 0918   BUN 9 09/18/2016 0929   CREATININE 0.68 03/19/2020 0918   CALCIUM 9.2 03/19/2020 0918   GFRNONAA >60 12/01/2019 1558   GFRAA >60 12/01/2019 1558    Lipid Panel     Component Value Date/Time   CHOL 245 (H) 12/28/2020 1528   CHOL 200 (H) 03/23/2017 0844   TRIG 74 12/28/2020 1528   HDL 83 12/28/2020  1528   HDL 57 03/23/2017 0844   CHOLHDL 3.0 12/28/2020 1528   VLDL 36.2 03/19/2020 0918   LDLCALC 144 (H) 12/28/2020 1528    CBC    Component Value Date/Time   WBC 5.0 03/19/2020 0918   RBC 3.88 03/19/2020 0918   HGB 12.6 03/19/2020 0918   HGB 13.2 09/18/2016 0929   HCT 37.2 03/19/2020 0918   HCT 38.1 09/18/2016 0929   PLT 170.0 03/19/2020 0918   PLT 268 09/18/2016 0929   MCV 95.8 03/19/2020 0918   MCV 94 09/18/2016 0929   MCH 31.6 12/01/2019 1558   MCHC 33.8 03/19/2020 0918   RDW 13.2 03/19/2020 0918   RDW 12.8 09/18/2016 0929   LYMPHSABS 2.8 08/30/2019 0315   LYMPHSABS 2.8 08/03/2014 1609   MONOABS 0.6 08/30/2019 0315   EOSABS 0.1 08/30/2019 0315   EOSABS 0.1 08/03/2014 1609   BASOSABS 0.0 08/30/2019 0315   BASOSABS 0.0 08/03/2014 1609    Hgb A1C Lab Results  Component Value Date   HGBA1C 6.0 (H) 12/28/2020            Assessment & Plan:  Preventative Health Maintenance:  Flu shot today Tetanus UTD Encouraged her to get her COVID booster Discussed Shingrix vaccine, she will check coverage with her insurance company She no longer needs pap smear Mammogram ordered, she will call to schedule Colon screening UTD Encouraged her to consume a balanced diet and  exercise regimen Advised her to see an eye doctor and dentist annually We will check CBC, c-Met, lipid, A1c and hep C today  RTC in 6 months for follow-up of chronic conditions Webb Silversmith, NP This visit occurred during the SARS-CoV-2 public health emergency.  Safety protocols were in place, including screening questions prior to the visit, additional usage of staff PPE, and extensive cleaning of exam room while observing appropriate contact time as indicated for disinfecting solutions.

## 2021-03-29 LAB — COMPLETE METABOLIC PANEL WITH GFR
AG Ratio: 1.3 (calc) (ref 1.0–2.5)
ALT: 12 U/L (ref 6–29)
AST: 12 U/L (ref 10–35)
Albumin: 4.1 g/dL (ref 3.6–5.1)
Alkaline phosphatase (APISO): 66 U/L (ref 37–153)
BUN: 12 mg/dL (ref 7–25)
CO2: 24 mmol/L (ref 20–32)
Calcium: 9.6 mg/dL (ref 8.6–10.4)
Chloride: 107 mmol/L (ref 98–110)
Creat: 0.74 mg/dL (ref 0.50–1.03)
Globulin: 3.2 g/dL (calc) (ref 1.9–3.7)
Glucose, Bld: 119 mg/dL — ABNORMAL HIGH (ref 65–99)
Potassium: 4 mmol/L (ref 3.5–5.3)
Sodium: 140 mmol/L (ref 135–146)
Total Bilirubin: 0.5 mg/dL (ref 0.2–1.2)
Total Protein: 7.3 g/dL (ref 6.1–8.1)
eGFR: 95 mL/min/{1.73_m2} (ref >=60)

## 2021-03-29 LAB — HEMOGLOBIN A1C
Hgb A1c MFr Bld: 5.8 % of total Hgb — ABNORMAL HIGH (ref <5.7)
Mean Plasma Glucose: 120 mg/dL
eAG (mmol/L): 6.6 mmol/L

## 2021-03-29 LAB — HEPATITIS C ANTIBODY
Hepatitis C Ab: NONREACTIVE
SIGNAL TO CUT-OFF: 0.02 (ref <1.00)

## 2021-03-29 LAB — CBC
HCT: 39.6 % (ref 35.0–45.0)
Hemoglobin: 13.2 g/dL (ref 11.7–15.5)
MCH: 32.2 pg (ref 27.0–33.0)
MCHC: 33.3 g/dL (ref 32.0–36.0)
MCV: 96.6 fL (ref 80.0–100.0)
MPV: 10.8 fL (ref 7.5–12.5)
Platelets: 212 10*3/uL (ref 140–400)
RBC: 4.1 10*6/uL (ref 3.80–5.10)
RDW: 12.4 % (ref 11.0–15.0)
WBC: 5.7 10*3/uL (ref 3.8–10.8)

## 2021-03-29 LAB — LIPID PANEL
Cholesterol: 236 mg/dL — ABNORMAL HIGH (ref <200)
HDL: 73 mg/dL (ref >=50)
LDL Cholesterol (Calc): 144 mg/dL (calc) — ABNORMAL HIGH
Non-HDL Cholesterol (Calc): 163 mg/dL (calc) — ABNORMAL HIGH (ref <130)
Total CHOL/HDL Ratio: 3.2 (calc) (ref <5.0)
Triglycerides: 88 mg/dL (ref <150)

## 2021-04-02 ENCOUNTER — Encounter: Payer: Self-pay | Admitting: Internal Medicine

## 2021-04-02 MED ORDER — ATORVASTATIN CALCIUM 10 MG PO TABS: 10.0000 mg | ORAL_TABLET | Freq: Every day | ORAL | 0 refills | Status: DC

## 2021-04-05 ENCOUNTER — Encounter: Payer: Self-pay | Admitting: Internal Medicine

## 2021-04-11 ENCOUNTER — Other Ambulatory Visit: Payer: Self-pay

## 2021-04-11 ENCOUNTER — Encounter: Payer: Self-pay | Admitting: Internal Medicine

## 2021-04-11 ENCOUNTER — Telehealth (INDEPENDENT_AMBULATORY_CARE_PROVIDER_SITE_OTHER): Payer: 59 | Admitting: Internal Medicine

## 2021-04-11 DIAGNOSIS — F5102 Adjustment insomnia: Secondary | ICD-10-CM | POA: Diagnosis not present

## 2021-04-11 DIAGNOSIS — F419 Anxiety disorder, unspecified: Secondary | ICD-10-CM | POA: Diagnosis not present

## 2021-04-11 DIAGNOSIS — Z0289 Encounter for other administrative examinations: Secondary | ICD-10-CM | POA: Diagnosis not present

## 2021-04-11 DIAGNOSIS — M545 Low back pain, unspecified: Secondary | ICD-10-CM

## 2021-04-11 DIAGNOSIS — F32A Depression, unspecified: Secondary | ICD-10-CM

## 2021-04-11 DIAGNOSIS — G8929 Other chronic pain: Secondary | ICD-10-CM

## 2021-04-11 DIAGNOSIS — Z636 Dependent relative needing care at home: Secondary | ICD-10-CM | POA: Diagnosis not present

## 2021-04-11 NOTE — Progress Notes (Signed)
Virtual Visit via Video Note  I connected with [name] on 04/11/21 at 11:20 AM EDT by a video enabled telemedicine application and verified that I am speaking with the correct person using two identifiers.  Location: Patient: Home Provider: Office  Persons participating in this video call: Webb Silversmith, NP and Lorna Dibble.   I discussed the limitations of evaluation and management by telemedicine and the availability of in person appointments. The patient expressed understanding and agreed to proceed.  History of Present Illness:  Patient requesting FMLA form completion.  She needs 2 sets of form completed.  The first she needs off for appointments and surgery related to her husband's recent diagnosis of prostate cancer.  She reports she will need continuous leave from 11/18-11/28 for his preop, surgery and recovery.  She reports he has a follow-up appointment 12/23 and expects to have 1-2 appointments a month for his follow-up for the next few months.  The second FLMA forms need to be for her insomnia and back pain.  She has failed multiple medications such as Trazodone, Hydroxyzine and Ambien.  She is having difficulty falling asleep and staying asleep.  She has difficulty turning her brain off.  The back pain is a chronic issue which has improved slightly.  She currently uses Voltaren gel as needed with some relief of symptoms.  She also has a form from [employer] that she would like me to fill out to request exception to not turn her electricity off despite being behind in her payments at this time.  She is attributing this due to financial strain secondary to her husband's recent diagnosed with prostate cancer and his inability to work.  Past Medical History:  Diagnosis Date   Allergy    DDD (degenerative disc disease), lumbar    Depression     Current Outpatient Medications  Medication Sig Dispense Refill   ALLEGRA-D ALLERGY & CONGESTION 180-240 MG 24 hr tablet TAKE  1 TABLET BY MOUTH EVERY DAY AS NEEDED 30 tablet 1   atorvastatin (LIPITOR) 10 MG tablet Take 1 tablet (10 mg total) by mouth daily. 90 tablet 0   diclofenac Sodium (VOLTAREN) 1 % GEL APPLY 2 GRAMS TO AFFECTED AREA 4 TIMES A DAY 100 g 0   famotidine (PEPCID) 20 MG tablet TAKE 1 TABLET (20 MG TOTAL) BY MOUTH 2 (TWO) TIMES DAILY AS NEEDED FOR HEARTBURN OR INDIGESTION. (Patient taking differently: Take 20 mg by mouth 2 (two) times daily.) 180 tablet 1   pantoprazole (PROTONIX) 40 MG tablet Take 1 tablet (40 mg total) by mouth 2 (two) times daily. 60 tablet 5   zolpidem (AMBIEN) 5 MG tablet Take 1 tablet (5 mg total) by mouth at bedtime as needed for sleep. 30 tablet 0   No current facility-administered medications for this visit.    Allergies  Allergen Reactions   Gabapentin Other (See Comments)    Hair loss   Hydrochlorothiazide Other (See Comments)    The pt state she couldn't function and felt like she was dying    Penicillins Other (See Comments)    Childhood allergy Has patient had a PCN reaction causing immediate rash, facial/tongue/throat swelling, SOB or lightheadedness with hypotension: Unknown Has patient had a PCN reaction causing severe rash involving mucus membranes or skin necrosis: Unknown Has patient had a PCN reaction that required hospitalization: No  Has patient had a PCN reaction occurring within the last 10 years: No  If all of the above answers are "NO", then  may proceed with Cephalosporin use.    Orange Juice [Orange Oil] Rash    rash    Family History  Problem Relation Age of Onset   Hyperlipidemia Mother    Heart disease Maternal Aunt    Stroke Maternal Aunt    Diabetes Maternal Aunt    Heart disease Maternal Uncle    Stroke Maternal Uncle    Cancer Neg Hx     Social History   Socioeconomic History   Marital status: Married    Spouse name: Not on file   Number of children: Not on file   Years of education: Not on file   Highest education level: Not  on file  Occupational History   Not on file  Tobacco Use   Smoking status: Never   Smokeless tobacco: Never  Vaping Use   Vaping Use: Never used  Substance and Sexual Activity   Alcohol use: Yes    Comment: social   Drug use: No   Sexual activity: Yes    Partners: Male    Birth control/protection: Post-menopausal, Surgical  Other Topics Concern   Not on file  Social History Narrative   Not on file   Social Determinants of Health   Financial Resource Strain: Not on file  Food Insecurity: Not on file  Transportation Needs: Not on file  Physical Activity: Not on file  Stress: Not on file  Social Connections: Not on file  Intimate Partner Violence: Not on file     Constitutional: Patient reports fatigue.  Denies fever, malaise, headache or abrupt weight changes.  Respiratory: Denies difficulty breathing, shortness of breath, cough or sputum production.   Cardiovascular: Denies chest pain, chest tightness, palpitations or swelling in the hands or feet.  Musculoskeletal: Patient reports chronic low back pain.  Denies decrease in range of motion, difficulty with gait, muscle pain or joint swelling.  Skin: Denies redness, rashes, lesions or ulcercations.  Neurological: Patient reports insomnia.  Denies dizziness, difficulty with memory, difficulty with speech or problems with balance and coordination.  Psych: Patient reports stress, anxiety and depression.  Denies SI/HI.  No other specific complaints in a complete review of systems (except as listed in HPI above).    Observations/Objective:   Wt Readings from Last 3 Encounters:  03/28/21 264 lb 9.6 oz (120 kg)  12/28/20 257 lb 3.2 oz (116.7 kg)  05/21/20 239 lb (108.4 kg)    General: Appears her stated age, obese, in NAD. HEENT: Head: normal shape and size; Eyes:  EOMs intact; Pulmonary/Chest: Normal effort. No respiratory distress. Neurological: Alert and oriented.  Psychiatric: Mood and affect normal.  Mildly anxious  appearing.  Judgment and thought content normal.    BMET    Component Value Date/Time   NA 140 03/28/2021 0838   NA 142 09/18/2016 0929   K 4.0 03/28/2021 0838   CL 107 03/28/2021 0838   CO2 24 03/28/2021 0838   GLUCOSE 119 (H) 03/28/2021 0838   BUN 12 03/28/2021 0838   BUN 9 09/18/2016 0929   CREATININE 0.74 03/28/2021 0838   CALCIUM 9.6 03/28/2021 0838   GFRNONAA >60 12/01/2019 1558   GFRAA >60 12/01/2019 1558    Lipid Panel     Component Value Date/Time   CHOL 236 (H) 03/28/2021 0838   CHOL 200 (H) 03/23/2017 0844   TRIG 88 03/28/2021 0838   HDL 73 03/28/2021 0838   HDL 57 03/23/2017 0844   CHOLHDL 3.2 03/28/2021 0838   VLDL 36.2 03/19/2020 6295  LDLCALC 144 (H) 03/28/2021 0838    CBC    Component Value Date/Time   WBC 5.7 03/28/2021 0838   RBC 4.10 03/28/2021 0838   HGB 13.2 03/28/2021 0838   HGB 13.2 09/18/2016 0929   HCT 39.6 03/28/2021 0838   HCT 38.1 09/18/2016 0929   PLT 212 03/28/2021 0838   PLT 268 09/18/2016 0929   MCV 96.6 03/28/2021 0838   MCV 94 09/18/2016 0929   MCH 32.2 03/28/2021 0838   MCHC 33.3 03/28/2021 0838   RDW 12.4 03/28/2021 0838   RDW 12.8 09/18/2016 0929   LYMPHSABS 2.8 08/30/2019 0315   LYMPHSABS 2.8 08/03/2014 1609   MONOABS 0.6 08/30/2019 0315   EOSABS 0.1 08/30/2019 0315   EOSABS 0.1 08/03/2014 1609   BASOSABS 0.0 08/30/2019 0315   BASOSABS 0.0 08/03/2014 1609    Hgb A1C Lab Results  Component Value Date   HGBA1C 5.8 (H) 03/28/2021        Assessment and Plan:  Encounter for Form Completion for Caregiver Stress, Insomnia and Chronic Low Back Pain:  FLMA forms completed per request, will scan copy of chart and fax back to her employer [employer] paperwork filled out, copy scanned to chart we will fax back to [employer] offered Encourage daily stretching, massage as needed Continue Voltaren gel  Return precautions discussed  Follow Up Instructions:    I discussed the assessment and treatment  plan with the patient. The patient was provided an opportunity to ask questions and all were answered. The patient agreed with the plan and demonstrated an understanding of the instructions.   The patient was advised to call back or seek an in-person evaluation if the symptoms worsen or if the condition fails to improve as anticipated.     Webb Silversmith, NP

## 2021-04-12 ENCOUNTER — Encounter: Payer: Self-pay | Admitting: Internal Medicine

## 2021-04-12 NOTE — Patient Instructions (Signed)
Stress, Adult Stress is a normal reaction to life events. Stress is what you feel when life demands more than you are used to, or more than you think you can handle. Some stress can be useful, such as studying for a test or meeting a deadline at work. Stress that occurs too often or for too long can cause problems. It can affect your emotional health and interfere with relationships and normal daily activities. Too much stress can weaken your body's defense system (immune system) and increase your risk for physical illness. If you already have a medical problem, stress can make it worse. What are the causes? All sorts of life events can cause stress. An event that causes stress for one person may not be stressful for another person. Major life events, whether positive or negative, commonly cause stress. Examples include: Losing a job or starting a new job. Losing a loved one. Moving to a new town or home. Getting married or divorced. Having a baby. Getting injured or sick. Less obvious life events can also cause stress, especially if they occur day after day or in combination with each other. Examples include: Working long hours. Driving in traffic. Caring for children. Being in debt. Being in a difficult relationship. What are the signs or symptoms? Stress can cause emotional symptoms, including: Anxiety. This is feeling worried, afraid, on edge, overwhelmed, or out of control. Anger, including irritation or impatience. Depression. This is feeling sad, down, helpless, or guilty. Trouble focusing, remembering, or making decisions. Stress can cause physical symptoms, including: Aches and pains. These may affect your head, neck, back, stomach, or other areas of your body. Tight muscles or a clenched jaw. Low energy. Trouble sleeping. Stress can cause unhealthy behaviors, including: Eating to feel better (overeating) or skipping meals. Working too much or putting off tasks. Smoking,  drinking alcohol, or using drugs to feel better. How is this diagnosed? Stress is diagnosed through an assessment by your health care provider. He or she may diagnose this condition based on: Your symptoms and any stressful life events. Your medical history. Tests to rule out other causes of your symptoms. Depending on your condition, your health care provider may refer you to a specialist for further evaluation. How is this treated? Stress management techniques are the recommended treatment for stress. Medicine is not typically recommended for the treatment of stress. Techniques to reduce your reaction to stressful life events include: Stress identification. Monitor yourself for symptoms of stress and identify what causes stress for you. These skills may help you to avoid or prepare for stressful events. Time management. Set your priorities, keep a calendar of events, and learn to say no. Taking these actions can help you avoid making too many commitments. Techniques for coping with stress include: Rethinking the problem. Try to think realistically about stressful events rather than ignoring them or overreacting. Try to find the positives in a stressful situation rather than focusing on the negatives. Exercise. Physical exercise can release both physical and emotional tension. The key is to find a form of exercise that you enjoy and do it regularly. Relaxation techniques. These relax the body and mind. The key is to find one or more that you enjoy and use the techniques regularly. Examples include: Meditation, deep breathing, or progressive relaxation techniques. Yoga or tai chi. Biofeedback, mindfulness techniques, or journaling. Listening to music, being out in nature, or participating in other hobbies. Practicing a healthy lifestyle. Eat a balanced diet, drink plenty of water, limit or  avoid caffeine, and get plenty of sleep. Having a strong support network. Spend time with family, friends,  or other people you enjoy being around. Express your feelings and talk things over with someone you trust. Counseling or talk therapy with a mental health professional may be helpful if you are having trouble managing stress on your own. Follow these instructions at home: Lifestyle  Avoid drugs. Do not use any products that contain nicotine or tobacco, such as cigarettes, e-cigarettes, and chewing tobacco. If you need help quitting, ask your health care provider. Limit alcohol intake to no more than 1 drink a day for nonpregnant women and 2 drinks a day for men. One drink equals 12 oz of beer, 5 oz of wine, or 1 oz of hard liquor Do not use alcohol or drugs to relax. Eat a balanced diet that includes fresh fruits and vegetables, whole grains, lean meats, fish, eggs, and beans, and low-fat dairy. Avoid processed foods and foods high in added fat, sugar, and salt. Exercise at least 30 minutes on 5 or more days each week. Get 7-8 hours of sleep each night. General instructions  Practice stress management techniques as discussed with your health care provider. Drink enough fluid to keep your urine clear or pale yellow. Take over-the-counter and prescription medicines only as told by your health care provider. Keep all follow-up visits as told by your health care provider. This is important. Contact a health care provider if: Your symptoms get worse. You have new symptoms. You feel overwhelmed by your problems and can no longer manage them on your own. Get help right away if: You have thoughts of hurting yourself or others. If you ever feel like you may hurt yourself or others, or have thoughts about taking your own life, get help right away. You can go to your nearest emergency department or call: Your local emergency services (911 in the [place] A suicide crisis helpline, such as the [place] at (410)025-6148. This is open 24 hours a day. Summary Stress is a  normal reaction to life events. It can cause problems if it happens too often or for too long. Practicing stress management techniques is the best way to treat stress. Counseling or talk therapy with a mental health professional may be helpful if you are having trouble managing stress on your own. This information is not intended to replace advice given to you by your health care provider. Make sure you discuss any questions you have with your health care provider. Document Revised: 08/10/2020 Document Reviewed: 02/17/2020 Elsevier Patient Education  2022 [employer].

## 2021-05-08 ENCOUNTER — Other Ambulatory Visit: Payer: Self-pay | Admitting: Gastroenterology

## 2021-05-08 DIAGNOSIS — R1310 Dysphagia, unspecified: Secondary | ICD-10-CM

## 2021-05-24 ENCOUNTER — Telehealth: Payer: 59 | Admitting: Physician Assistant

## 2021-05-24 ENCOUNTER — Ambulatory Visit: Payer: Self-pay

## 2021-05-24 DIAGNOSIS — J019 Acute sinusitis, unspecified: Secondary | ICD-10-CM | POA: Diagnosis not present

## 2021-05-24 DIAGNOSIS — B9789 Other viral agents as the cause of diseases classified elsewhere: Secondary | ICD-10-CM

## 2021-05-24 DIAGNOSIS — R051 Acute cough: Secondary | ICD-10-CM | POA: Diagnosis not present

## 2021-05-24 MED ORDER — PSEUDOEPH-BROMPHEN-DM 30-2-10 MG/5ML PO SYRP
5.0000 mL | ORAL_SOLUTION | Freq: Four times a day (QID) | ORAL | 0 refills | Status: DC | PRN
Start: 2021-05-24 — End: 2021-10-14

## 2021-05-24 MED ORDER — BENZONATATE 100 MG PO CAPS: 100.0000 mg | ORAL_CAPSULE | Freq: Three times a day (TID) | ORAL | 0 refills | Status: DC | PRN

## 2021-05-24 MED ORDER — OLOPATADINE HCL 0.2 % OP SOLN: 1.0000 [drp] | Freq: Two times a day (BID) | OPHTHALMIC | 0 refills | Status: DC

## 2021-05-24 MED ORDER — IPRATROPIUM BROMIDE 0.03 % NA SOLN: 2.0000 | Freq: Two times a day (BID) | NASAL | 0 refills | Status: DC

## 2021-05-24 NOTE — Telephone Encounter (Signed)
The pt schedule an appt for a mychart virtual video visit today at 9:20am.

## 2021-05-24 NOTE — Patient Instructions (Signed)
Donnella Bi, thank you for joining Mar Daring, PA-C for today's virtual visit.  While this provider is not your primary care provider (PCP), if your PCP is located in our provider database this encounter information will be shared with them immediately following your visit.  Consent: (Patient) [name] provided verbal consent for this virtual visit at the beginning of the encounter.  Current Medications:  Current Outpatient Medications:    benzonatate (TESSALON) 100 MG capsule, Take 1 capsule (100 mg total) by mouth 3 (three) times daily as needed., Disp: 30 capsule, Rfl: 0   brompheniramine-pseudoephedrine-DM 30-2-10 MG/5ML syrup, Take 5 mLs by mouth 4 (four) times daily as needed., Disp: 120 mL, Rfl: 0   ipratropium (ATROVENT) 0.03 % nasal spray, Place 2 sprays into both nostrils every 12 (twelve) hours., Disp: 30 mL, Rfl: 0   Olopatadine HCl 0.2 % SOLN, Apply 1 drop to eye in the morning and at bedtime., Disp: 2.5 mL, Rfl: 0   ALLEGRA-D ALLERGY & CONGESTION 180-240 MG 24 hr tablet, TAKE 1 TABLET BY MOUTH EVERY DAY AS NEEDED, Disp: 30 tablet, Rfl: 1   atorvastatin (LIPITOR) 10 MG tablet, Take 1 tablet (10 mg total) by mouth daily., Disp: 90 tablet, Rfl: 0   diclofenac Sodium (VOLTAREN) 1 % GEL, APPLY 2 GRAMS TO AFFECTED AREA 4 TIMES A DAY, Disp: 100 g, Rfl: 0   famotidine (PEPCID) 20 MG tablet, TAKE 1 TABLET (20 MG TOTAL) BY MOUTH 2 (TWO) TIMES DAILY AS NEEDED FOR HEARTBURN OR INDIGESTION. (Patient taking differently: Take 20 mg by mouth 2 (two) times daily.), Disp: 180 tablet, Rfl: 1   pantoprazole (PROTONIX) 40 MG tablet, Take 1 tablet (40 mg total) by mouth 2 (two) times daily., Disp: 60 tablet, Rfl: 5   Medications ordered in this encounter:  Meds ordered this encounter  Medications   ipratropium (ATROVENT) 0.03 % nasal spray    Sig: Place 2 sprays into both nostrils every 12 (twelve) hours.    Dispense:  30 mL    Refill:  0    Order Specific Question:    Supervising Provider    Answer:   Sabra Heck, BRIAN [[id number]]   brompheniramine-pseudoephedrine-DM 30-2-10 MG/5ML syrup    Sig: Take 5 mLs by mouth 4 (four) times daily as needed.    Dispense:  120 mL    Refill:  0    Order Specific Question:   Supervising Provider    Answer:   MILLER, BRIAN [[id number]]   benzonatate (TESSALON) 100 MG capsule    Sig: Take 1 capsule (100 mg total) by mouth 3 (three) times daily as needed.    Dispense:  30 capsule    Refill:  0    Order Specific Question:   Supervising Provider    Answer:   MILLER, BRIAN [[id number]]   Olopatadine HCl 0.2 % SOLN    Sig: Apply 1 drop to eye in the morning and at bedtime.    Dispense:  2.5 mL    Refill:  0    Order Specific Question:   Supervising Provider    Answer:   Sabra Heck, BRIAN [[id number]]     *If you need refills on other medications prior to your next appointment, please contact your pharmacy*  Follow-Up: Call back or seek an in-person evaluation if the symptoms worsen or if the condition fails to improve as anticipated.  Other Instructions Sinusitis, Adult Sinusitis is soreness and swelling (inflammation) of your sinuses. Sinuses are hollow spaces in the bones around  your face. They are located: Around your eyes. In the middle of your forehead. Behind your nose. In your cheekbones. Your sinuses and nasal passages are lined with a fluid called mucus. Mucus drains out of your sinuses. Swelling can trap mucus in your sinuses. This lets germs (bacteria, virus, or fungus) grow, which leads to infection. Most of the time, this condition is caused by a virus. What are the causes? This condition is caused by: Allergies. Asthma. Germs. Things that block your nose or sinuses. Growths in the nose (nasal polyps). Chemicals or irritants in the air. Fungus (rare). What increases the risk? You are more likely to develop this condition if: You have a weak body defense system (immune system). You do a lot of swimming or diving. You use  nasal sprays too much. You smoke. What are the signs or symptoms? The main symptoms of this condition are pain and a feeling of pressure around the sinuses. Other symptoms include: Stuffy nose (congestion). Runny nose (drainage). Swelling and warmth in the sinuses. Headache. Toothache. A cough that may get worse at night. Mucus that collects in the throat or the back of the nose (postnasal drip). Being unable to smell and taste. Being very tired (fatigue). A fever. Sore throat. Bad breath. How is this diagnosed? This condition is diagnosed based on: Your symptoms. Your medical history. A physical exam. Tests to find out if your condition is short-term (acute) or long-term (chronic). Your doctor may: Check your nose for growths (polyps). Check your sinuses using a tool that has a light (endoscope). Check for allergies or germs. Do imaging tests, such as an MRI or CT scan. How is this treated? Treatment for this condition depends on the cause and whether it is short-term or long-term. If caused by a virus, your symptoms should go away on their own within 10 days. You may be given medicines to relieve symptoms. They include: Medicines that shrink swollen tissue in the nose. Medicines that treat allergies (antihistamines). A spray that treats swelling of the nostrils.  Rinses that help get rid of thick mucus in your nose (nasal saline washes). If caused by bacteria, your doctor may wait to see if you will get better without treatment. You may be given antibiotic medicine if you have: A very bad infection. A weak body defense system. If caused by growths in the nose, you may need to have surgery. Follow these instructions at home: Medicines Take, use, or apply over-the-counter and prescription medicines only as told by your doctor. These may include nasal sprays. If you were prescribed an antibiotic medicine, take it as told by your doctor. Do not stop taking the antibiotic even if  you start to feel better. Hydrate and humidify  Drink enough water to keep your pee (urine) pale yellow. Use a cool mist humidifier to keep the humidity level in your home above 50%. Breathe in steam for 10-15 minutes, 3-4 times a day, or as told by your doctor. You can do this in the bathroom while a hot shower is running. Try not to spend time in cool or dry air. Rest Rest as much as you can. Sleep with your head raised (elevated). Make sure you get enough sleep each night. General instructions  Put a warm, moist washcloth on your face 3-4 times a day, or as often as told by your doctor. This will help with discomfort. Wash your hands often with soap and water. If there is no soap and water, use hand sanitizer.  Do not smoke. Avoid being around people who are smoking (secondhand smoke). Keep all follow-up visits as told by your doctor. This is important. Contact a doctor if: You have a fever. Your symptoms get worse. Your symptoms do not get better within 10 days. Get help right away if: You have a very bad headache. You cannot stop throwing up (vomiting). You have very bad pain or swelling around your face or eyes. You have trouble seeing. You feel confused. Your neck is stiff. You have trouble breathing. Summary Sinusitis is swelling of your sinuses. Sinuses are hollow spaces in the bones around your face. This condition is caused by tissues in your nose that become inflamed or swollen. This traps germs. These can lead to infection. If you were prescribed an antibiotic medicine, take it as told by your doctor. Do not stop taking it even if you start to feel better. Keep all follow-up visits as told by your doctor. This is important. This information is not intended to replace advice given to you by your health care provider. Make sure you discuss any questions you have with your health care provider. Document Revised: 11/02/2017 Document Reviewed: 11/02/2017 Elsevier Patient  Education  2022 [employer].    If you have been instructed to have an in-person evaluation today at a local Urgent Care facility, please use the link below. It will take you to a list of all of our available [facility], including address, phone number and hours of operation. Please do not delay care.  [facility]  If you or a family member do not have a primary care provider, use the link below to schedule a visit and establish care. When you choose a [facility] primary care physician or advanced practice provider, you gain a long-term partner in health. Find a Primary Care Provider  Learn more about [facility]'s in-office and virtual care options: [facility]

## 2021-05-24 NOTE — Telephone Encounter (Signed)
Pt calling for virtual appt.  Pt has cough, chest congestion, runny nose, facial pain and pressure, headache, scratchy throat. Please advise.   Pt. States 2 days ago started having headache and facial pain, sinus drainage. Has cough and scratchy throat. No availability for virtual visit . Instructed pt. To do My Chart e-visit or virtual care through [email or web address]. Asking to be worked in. Please advise.     Answer Assessment - Initial Assessment Questions 1. LOCATION: "Where does it hurt?"      Headache 2. ONSET: "When did the sinus pain start?"  (e.g., hours, days)      2 days ago 3. SEVERITY: "How bad is the pain?"   (Scale 1-10; mild, moderate or severe)   - MILD (1-3): doesn't interfere with normal activities    - MODERATE (4-7): interferes with normal activities (e.g., work or school) or awakens from sleep   - SEVERE (8-10): excruciating pain and patient unable to do any normal activities        Moderate 4. RECURRENT SYMPTOM: "Have you ever had sinus problems before?" If Yes, ask: "When was the last time?" and "What happened that time?"      Yes 5. NASAL CONGESTION: "Is the nose blocked?" If Yes, ask: "Can you open it or must you breathe through your mouth?"     No 6. NASAL DISCHARGE: "Do you have discharge from your nose?" If so ask, "What color?"     Clear 7. FEVER: "Do you have a fever?" If Yes, ask: "What is it, how was it measured, and when did it start?"      No 8. OTHER SYMPTOMS: "Do you have any other symptoms?" (e.g., sore throat, cough, earache, difficulty breathing)     Sore throat,cough, scratchy throat 9. PREGNANCY: "Is there any chance you are pregnant?" "When was your last menstrual period?"     No  Protocols used: Sinus Pain or Congestion-A-AH

## 2021-05-24 NOTE — Progress Notes (Signed)
Virtual Visit Consent   Jansen Goodpasture, you are scheduled for a virtual visit with a [facility] provider today.     Just as with appointments in the office, your consent must be obtained to participate.  Your consent will be active for this visit and any virtual visit you may have with one of our providers in the next 365 days.     If you have a MyChart account, a copy of this consent can be sent to you electronically.  All virtual visits are billed to your insurance company just like a traditional visit in the office.    As this is a virtual visit, video technology does not allow for your provider to perform a traditional examination.  This may limit your provider's ability to fully assess your condition.  If your provider identifies any concerns that need to be evaluated in person or the need to arrange testing (such as labs, EKG, etc.), we will make arrangements to do so.     Although advances in technology are sophisticated, we cannot ensure that it will always work on either your end or our end.  If the connection with a video visit is poor, the visit may have to be switched to a telephone visit.  With either a video or telephone visit, we are not always able to ensure that we have a secure connection.     I need to obtain your verbal consent now.   Are you willing to proceed with your visit today?    Jaxson Keener has provided verbal consent on 05/24/2021 for a virtual visit (video or telephone).   Mar Daring, PA-C   Date: 05/24/2021 9:49 AM   Virtual Visit via Video Note   I, Mar Daring, connected with  Tychelle Purkey  ([id number], [birth year]) on 05/24/21 at  9:30 AM EST by a video-enabled telemedicine application and verified that I am speaking with the correct person using two identifiers.  Location: Patient: Virtual Visit Location Patient: Home Provider: Virtual Visit Location Provider: Home Office   I discussed the limitations of evaluation  and management by telemedicine and the availability of in person appointments. The patient expressed understanding and agreed to proceed.    History of Present Illness: Glendale Wherry is a 55 y.o. who identifies as a female who was assigned female at birth, and is being seen today for possible sinus infection.  HPI: Sinusitis This is a new problem. The current episode started in the past 7 days (wednesday). The problem has been gradually worsening since onset. There has been no fever. Associated symptoms include congestion, coughing, headaches, sinus pressure, sneezing and a sore throat. Pertinent negatives include no chills or ear pain. Past treatments include acetaminophen (mucinex). The treatment provided no relief.     Problems:  Patient Active Problem List   Diagnosis Date Noted   Caregiver stress 02/14/2021   Insomnia 02/14/2021   Prediabetes 12/31/2020   HLD (hyperlipidemia) 12/31/2020   Frequent headaches 12/31/2020   Morbid obesity ([place]) 12/31/2020   Gastroesophageal reflux disease without esophagitis 11/15/2018   Arthralgia of multiple joints 06/02/2016   Anxiety and depression 08/03/2014    Allergies:  Allergies  Allergen Reactions   Gabapentin Other (See Comments)    Hair loss   Hydrochlorothiazide Other (See Comments)    The pt state she couldn't function and felt like she was dying    Penicillins Other (See Comments)    Childhood allergy Has patient had a PCN  reaction causing immediate rash, facial/tongue/throat swelling, SOB or lightheadedness with hypotension: Unknown Has patient had a PCN reaction causing severe rash involving mucus membranes or skin necrosis: Unknown Has patient had a PCN reaction that required hospitalization: No  Has patient had a PCN reaction occurring within the last 10 years: No  If all of the above answers are "NO", then may proceed with Cephalosporin use.    Orange Juice [Orange Oil] Rash    rash   Medications:  Current  Outpatient Medications:    benzonatate (TESSALON) 100 MG capsule, Take 1 capsule (100 mg total) by mouth 3 (three) times daily as needed., Disp: 30 capsule, Rfl: 0   brompheniramine-pseudoephedrine-DM 30-2-10 MG/5ML syrup, Take 5 mLs by mouth 4 (four) times daily as needed., Disp: 120 mL, Rfl: 0   ipratropium (ATROVENT) 0.03 % nasal spray, Place 2 sprays into both nostrils every 12 (twelve) hours., Disp: 30 mL, Rfl: 0   Olopatadine HCl 0.2 % SOLN, Apply 1 drop to eye in the morning and at bedtime., Disp: 2.5 mL, Rfl: 0   ALLEGRA-D ALLERGY & CONGESTION 180-240 MG 24 hr tablet, TAKE 1 TABLET BY MOUTH EVERY DAY AS NEEDED, Disp: 30 tablet, Rfl: 1   atorvastatin (LIPITOR) 10 MG tablet, Take 1 tablet (10 mg total) by mouth daily., Disp: 90 tablet, Rfl: 0   diclofenac Sodium (VOLTAREN) 1 % GEL, APPLY 2 GRAMS TO AFFECTED AREA 4 TIMES A DAY, Disp: 100 g, Rfl: 0   famotidine (PEPCID) 20 MG tablet, TAKE 1 TABLET (20 MG TOTAL) BY MOUTH 2 (TWO) TIMES DAILY AS NEEDED FOR HEARTBURN OR INDIGESTION. (Patient taking differently: Take 20 mg by mouth 2 (two) times daily.), Disp: 180 tablet, Rfl: 1   pantoprazole (PROTONIX) 40 MG tablet, Take 1 tablet (40 mg total) by mouth 2 (two) times daily., Disp: 60 tablet, Rfl: 5  Observations/Objective: Patient is well-developed, well-nourished in no acute distress.  Resting comfortably at home.  Head is normocephalic, atraumatic.  No labored breathing.  Speech is clear and coherent with logical content.  Patient is alert and oriented at baseline.    Assessment and Plan: 1. Acute viral sinusitis - ipratropium (ATROVENT) 0.03 % nasal spray; Place 2 sprays into both nostrils every 12 (twelve) hours.  Dispense: 30 mL; Refill: 0  2. Acute cough - brompheniramine-pseudoephedrine-DM 30-2-10 MG/5ML syrup; Take 5 mLs by mouth 4 (four) times daily as needed.  Dispense: 120 mL; Refill: 0 - benzonatate (TESSALON) 100 MG capsule; Take 1 capsule (100 mg total) by mouth 3 (three)  times daily as needed.  Dispense: 30 capsule; Refill: 0  - Suspect viral illness - Ipratropium, Bromfed DM and Tessalon perles prescribed - May continue Mucinex - Push fluids - Rest - Return precautions discussed  Follow Up Instructions: I discussed the assessment and treatment plan with the patient. The patient was provided an opportunity to ask questions and all were answered. The patient agreed with the plan and demonstrated an understanding of the instructions.  A copy of instructions were sent to the patient via MyChart unless otherwise noted below.    The patient was advised to call back or seek an in-person evaluation if the symptoms worsen or if the condition fails to improve as anticipated.  Time:  I spent 12 minutes with the patient via telehealth technology discussing the above problems/concerns.    Mar Daring, PA-C

## 2021-05-26 ENCOUNTER — Encounter: Payer: Self-pay | Admitting: Internal Medicine

## 2021-05-26 ENCOUNTER — Encounter: Payer: Self-pay | Admitting: Nurse Practitioner

## 2021-05-26 ENCOUNTER — Other Ambulatory Visit: Payer: Self-pay | Admitting: Emergency Medicine

## 2021-05-26 ENCOUNTER — Encounter: Payer: Self-pay | Admitting: Physician Assistant

## 2021-05-26 MED ORDER — DOXYCYCLINE HYCLATE 100 MG PO TABS: 100.0000 mg | ORAL_TABLET | Freq: Two times a day (BID) | ORAL | 0 refills | Status: AC

## 2021-05-26 NOTE — Progress Notes (Signed)
Patient reports persistent worsening symptoms.  Doxycycline prescribed to cover for possible bacterial sinus infection.

## 2021-05-26 NOTE — Progress Notes (Signed)
Work excuse provided to patient via Pharmacist, community.

## 2021-05-27 ENCOUNTER — Ambulatory Visit: Payer: Self-pay | Admitting: *Deleted

## 2021-05-27 NOTE — Telephone Encounter (Signed)
  Chief Complaint: Covid positive Saturday Symptoms: Dry cough, congestion, headache Frequency: Intermittent Pertinent Negatives: Patient denies SOB, no fever Disposition: [] ED /[] Urgent Care (no appt availability in office) / [] Appointment(In office/virtual)/ []  [facility]/ [x] Home Care/ [] Refused Recommended Disposition  Additional Notes: Symptoms started Thursday, did Evisit Fri. AM. Started on antibiotics "They though I had a sinus infection but then I tested positive for covid." States overall feeling better. Questioning if she should take the antibiotics as she is still having some sinus issues. Also interested in oral anti viral med if PCP thinks appropriate. [facility] care advise provided, verbalizes understanding.  Please advise.

## 2021-05-27 NOTE — Telephone Encounter (Signed)
Reason for Disposition . 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 COVID-19 DIAGNOSIS: "Who made your COVID-19 diagnosis?" "Was it confirmed by a positive lab test or self-test?" If not diagnosed by a doctor (or NP/PA), ask "Are there lots of cases (community spread) where you live?" Note: See public health department website, if unsure.     Home 2. COVID-19 EXPOSURE: "Was there any known exposure to COVID before the symptoms began?" CDC Definition of close contact: within 6 feet (2 meters) for a total of 15 minutes or more over a 24-hour period.      yes 3. ONSET: "When did the COVID-19 symptoms start?"      Thursday eve 4. WORST SYMPTOM: "What is your worst symptom?" (e.g., cough, fever, shortness of breath, muscle aches)     Congestion better 5. COUGH: "Do you have a cough?" If Yes, ask: "How bad is the cough?"       Cough, mostly dry 6. FEVER: "Do you have a fever?" If Yes, ask: "What is your temperature, how was it measured, and when did it start?"     no 7. RESPIRATORY STATUS: "Describe your breathing?" (e.g., shortness of breath, wheezing, unable to speak)      no 8. BETTER-SAME-WORSE: "Are you getting better, staying the same or getting worse compared to yesterday?"  If getting worse, ask, "In what way?"     Better 9. HIGH RISK DISEASE: "Do you have any chronic medical problems?" (e.g., asthma, heart or lung disease, weak immune system, obesity, etc.)     *No Answer* 10. VACCINE: "Have you had the COVID-19 vaccine?" If Yes, ask: "Which one, how many shots, when did you get it?"       2 11. BOOSTER: "Have you received your COVID-19 booster?" If Yes, ask: "Which one and when did you get it?"       1  13. OTHER SYMPTOMS: "Do you have any other symptoms?"  (e.g., chills, fatigue, headache, loss of smell or taste, muscle pain, sore  throat)       Sore throat, headache, no taste, sore throat 14. O2 SATURATION MONITOR:  "Do you use an oxygen saturation monitor (pulse oximeter) at home?" If Yes, ask "What is your reading (oxygen level) today?" "What is your usual oxygen saturation reading?" (e.g., 95%)       NA  Protocols used: Coronavirus (COVID-19) Diagnosed or Suspected-A-AH

## 2021-05-28 ENCOUNTER — Encounter: Payer: Self-pay | Admitting: Internal Medicine

## 2021-05-29 ENCOUNTER — Telehealth: Payer: 59 | Admitting: Physician Assistant

## 2021-05-29 DIAGNOSIS — J208 Acute bronchitis due to other specified organisms: Secondary | ICD-10-CM | POA: Diagnosis not present

## 2021-05-29 DIAGNOSIS — B9689 Other specified bacterial agents as the cause of diseases classified elsewhere: Secondary | ICD-10-CM

## 2021-05-29 DIAGNOSIS — B999 Unspecified infectious disease: Secondary | ICD-10-CM | POA: Diagnosis not present

## 2021-05-29 MED ORDER — BENZONATATE 100 MG PO CAPS: 100.0000 mg | ORAL_CAPSULE | Freq: Three times a day (TID) | ORAL | 0 refills | Status: DC | PRN

## 2021-05-29 NOTE — Telephone Encounter (Signed)
If she is not blowing green/brown mucous out of her nose or coughing up mucous, would not take abx. She is too far out from symptoms for antiviral therapy at this time

## 2021-05-29 NOTE — Progress Notes (Signed)
Virtual Visit Consent   [name], you are scheduled for a virtual visit with a [name] provider today.     Just as with appointments in the office, your consent must be obtained to participate.  Your consent will be active for this visit and any virtual visit you may have with one of our providers in the next 365 days.     If you have a MyChart account, a copy of this consent can be sent to you electronically.  All virtual visits are billed to your insurance company just like a traditional visit in the office.    As this is a virtual visit, video technology does not allow for your provider to perform a traditional examination.  This may limit your provider's ability to fully assess your condition.  If your provider identifies any concerns that need to be evaluated in person or the need to arrange testing (such as labs, EKG, etc.), we will make arrangements to do so.     Although advances in technology are sophisticated, we cannot ensure that it will always work on either your end or our end.  If the connection with a video visit is poor, the visit may have to be switched to a telephone visit.  With either a video or telephone visit, we are not always able to ensure that we have a secure connection.     I need to obtain your verbal consent now.   Are you willing to proceed with your visit today?    [name] has provided verbal consent on 05/29/2021 for a virtual visit (video or telephone).   [name], [place]   Date: 05/29/2021 9:16 AM   Virtual Visit via Video Note   I, [name], connected with  [name]  ([id number], 09-Dec-1965) on 05/29/21 at  9:15 AM EST by a video-enabled telemedicine application and verified that I am speaking with the correct person using two identifiers.  Location: Patient: Virtual Visit Location Patient: Home Provider: Virtual Visit Location Provider: Home Office   I discussed the limitations of  evaluation and management by telemedicine and the availability of in person appointments. The patient expressed understanding and agreed to proceed.    History of Present Illness: [name] is a 55 y.o. who identifies as a female who was assigned female at birth, and is being seen today for ongoing COVID symptoms. Notes her initial COVID symptoms are improved/resolved but now with increase in cough and chest congestion. Cough initially dry but now productive of thick white-yellow sputum. Denies fever. Some residual cough and mild upset stomach. Denies emesis but occasional nausea and anorexia.  Has continued to take antihistamine and Mucinex-DM.   HPI: HPI  Problems:  Patient Active Problem List   Diagnosis Date Noted   Caregiver stress 02/14/2021   Insomnia 02/14/2021   Prediabetes 12/31/2020   HLD (hyperlipidemia) 12/31/2020   Frequent headaches 12/31/2020   Morbid obesity ([place]) 12/31/2020   Gastroesophageal reflux disease without esophagitis 11/15/2018   Arthralgia of multiple joints 06/02/2016   Anxiety and depression 08/03/2014    Allergies:  Allergies  Allergen Reactions   Gabapentin Other (See Comments)    Hair loss   Hydrochlorothiazide Other (See Comments)    The pt state she couldn't function and felt like she was dying    Penicillins Other (See Comments)    Childhood allergy Has patient had a PCN reaction causing immediate rash, facial/tongue/throat swelling, SOB or lightheadedness with hypotension:  Unknown Has patient had a PCN reaction causing severe rash involving mucus membranes or skin necrosis: Unknown Has patient had a PCN reaction that required hospitalization: No  Has patient had a PCN reaction occurring within the last 10 years: No  If all of the above answers are "NO", then may proceed with Cephalosporin use.    Orange Juice [Orange Oil] Rash    rash   Medications:  Current Outpatient Medications:    ALLEGRA-D ALLERGY & CONGESTION 180-240 MG 24  hr tablet, TAKE 1 TABLET BY MOUTH EVERY DAY AS NEEDED, Disp: 30 tablet, Rfl: 1   atorvastatin (LIPITOR) 10 MG tablet, Take 1 tablet (10 mg total) by mouth daily., Disp: 90 tablet, Rfl: 0   benzonatate (TESSALON) 100 MG capsule, Take 1 capsule (100 mg total) by mouth 3 (three) times daily as needed., Disp: 30 capsule, Rfl: 0   brompheniramine-pseudoephedrine-DM 30-2-10 MG/5ML syrup, Take 5 mLs by mouth 4 (four) times daily as needed., Disp: 120 mL, Rfl: 0   diclofenac Sodium (VOLTAREN) 1 % GEL, APPLY 2 GRAMS TO AFFECTED AREA 4 TIMES A DAY, Disp: 100 g, Rfl: 0   doxycycline (VIBRA-TABS) 100 MG tablet, Take 1 tablet (100 mg total) by mouth 2 (two) times daily for 10 days., Disp: 20 tablet, Rfl: 0   famotidine (PEPCID) 20 MG tablet, TAKE 1 TABLET (20 MG TOTAL) BY MOUTH 2 (TWO) TIMES DAILY AS NEEDED FOR HEARTBURN OR INDIGESTION. (Patient taking differently: Take 20 mg by mouth 2 (two) times daily.), Disp: 180 tablet, Rfl: 1   ipratropium (ATROVENT) 0.03 % nasal spray, Place 2 sprays into both nostrils every 12 (twelve) hours., Disp: 30 mL, Rfl: 0   Olopatadine HCl 0.2 % SOLN, Apply 1 drop to eye in the morning and at bedtime., Disp: 2.5 mL, Rfl: 0   pantoprazole (PROTONIX) 40 MG tablet, Take 1 tablet (40 mg total) by mouth 2 (two) times daily., Disp: 60 tablet, Rfl: 5  Observations/Objective: Patient is well-developed, well-nourished in no acute distress.  Resting comfortably at home.  Head is normocephalic, atraumatic.  No labored breathing. Speech is clear and coherent with logical content.  Patient is alert and oriented at baseline.   Assessment and Plan: 1. Superimposed infection 2. Acute bacterial bronchitis She is to start Doxycycline previously sent in by [facility] provider and take as directed.  Increase fluids.  Rest.  Saline nasal spray.  Probiotic.  Mucinex as directed.  Humidifier in bedroom. Tessalon refilled with new dosing instructions.  Call or return to clinic if symptoms are not  improving.   Follow Up Instructions: I discussed the assessment and treatment plan with the patient. The patient was provided an opportunity to ask questions and all were answered. The patient agreed with the plan and demonstrated an understanding of the instructions.  A copy of instructions were sent to the patient via MyChart unless otherwise noted below.   The patient was advised to call back or seek an in-person evaluation if the symptoms worsen or if the condition fails to improve as anticipated.  Time:  I spent 10 minutes with the patient via telehealth technology discussing the above problems/concerns.    Alice Rio, PA-C

## 2021-05-29 NOTE — Patient Instructions (Signed)
Donnella Bi, thank you for joining [name], PA-C for today's virtual visit.  While this provider is not your primary care provider (PCP), if your PCP is located in our provider database this encounter information will be shared with them immediately following your visit.  Consent: (Patient) [name] provided verbal consent for this virtual visit at the beginning of the encounter.  Current Medications:  Current Outpatient Medications:    ALLEGRA-D ALLERGY & CONGESTION 180-240 MG 24 hr tablet, TAKE 1 TABLET BY MOUTH EVERY DAY AS NEEDED, Disp: 30 tablet, Rfl: 1   atorvastatin (LIPITOR) 10 MG tablet, Take 1 tablet (10 mg total) by mouth daily., Disp: 90 tablet, Rfl: 0   benzonatate (TESSALON) 100 MG capsule, Take 1-2 capsules (100-200 mg total) by mouth 3 (three) times daily as needed for cough., Disp: 30 capsule, Rfl: 0   brompheniramine-pseudoephedrine-DM 30-2-10 MG/5ML syrup, Take 5 mLs by mouth 4 (four) times daily as needed., Disp: 120 mL, Rfl: 0   diclofenac Sodium (VOLTAREN) 1 % GEL, APPLY 2 GRAMS TO AFFECTED AREA 4 TIMES A DAY, Disp: 100 g, Rfl: 0   doxycycline (VIBRA-TABS) 100 MG tablet, Take 1 tablet (100 mg total) by mouth 2 (two) times daily for 10 days., Disp: 20 tablet, Rfl: 0   famotidine (PEPCID) 20 MG tablet, TAKE 1 TABLET (20 MG TOTAL) BY MOUTH 2 (TWO) TIMES DAILY AS NEEDED FOR HEARTBURN OR INDIGESTION. (Patient taking differently: Take 20 mg by mouth 2 (two) times daily.), Disp: 180 tablet, Rfl: 1   ipratropium (ATROVENT) 0.03 % nasal spray, Place 2 sprays into both nostrils every 12 (twelve) hours., Disp: 30 mL, Rfl: 0   Olopatadine HCl 0.2 % SOLN, Apply 1 drop to eye in the morning and at bedtime., Disp: 2.5 mL, Rfl: 0   pantoprazole (PROTONIX) 40 MG tablet, Take 1 tablet (40 mg total) by mouth 2 (two) times daily., Disp: 60 tablet, Rfl: 5   Medications ordered in this encounter:  Meds ordered this encounter  Medications   benzonatate  (TESSALON) 100 MG capsule    Sig: Take 1-2 capsules (100-200 mg total) by mouth 3 (three) times daily as needed for cough.    Dispense:  30 capsule    Refill:  0    Order Specific Question:   Supervising Provider    Answer:   Sabra Heck, [place]     *If you need refills on other medications prior to your next appointment, please contact your pharmacy*  Follow-Up: Call back or seek an in-person evaluation if the symptoms worsen or if the condition fails to improve as anticipated.  Other Instructions Take antibiotic (Doxycycline) as directed.  Increase fluids.  Get plenty of rest. Use Mucinex for congestion. Continue Tessalon. Take a daily probiotic (I recommend Align or Culturelle, but even Activia Yogurt may be beneficial).  A humidifier placed in the bedroom may offer some relief for a dry, scratchy throat of nasal irritation.  Read information below on acute bronchitis. Please call or return to clinic if symptoms are not improving.  Acute Bronchitis Bronchitis is when the airways that extend from the windpipe into the lungs get red, puffy, and painful (inflamed). Bronchitis often causes thick spit (mucus) to develop. This leads to a cough. A cough is the most common symptom of bronchitis. In acute bronchitis, the condition usually begins suddenly and goes away over time (usually in 2 weeks). Smoking, allergies, and asthma can make bronchitis worse. Repeated episodes of bronchitis may cause more lung  problems.  HOME CARE Rest. Drink enough fluids to keep your pee (urine) clear or pale yellow (unless you need to limit fluids as told by your doctor). Only take over-the-counter or prescription medicines as told by your doctor. Avoid smoking and secondhand smoke. These can make bronchitis worse. If you are a smoker, think about using nicotine gum or skin patches. Quitting smoking will help your lungs heal faster. Reduce the chance of getting bronchitis again by: Washing your hands  often. Avoiding people with cold symptoms. Trying not to touch your hands to your mouth, nose, or eyes. Follow up with your doctor as told.  GET HELP IF: Your symptoms do not improve after 1 week of treatment. Symptoms include: Cough. Fever. Coughing up thick spit. Body aches. Chest congestion. Chills. Shortness of breath. Sore throat.  GET HELP RIGHT AWAY IF:  You have an increased fever. You have chills. You have severe shortness of breath. You have bloody thick spit (sputum). You throw up (vomit) often. You lose too much body fluid (dehydration). You have a severe headache. You faint.  MAKE SURE YOU:  Understand these instructions. Will watch your condition. Will get help right away if you are not doing well or get worse. Document Released: 11/19/2007 Document Revised: 02/02/2013 Document Reviewed: 11/23/2012 [facility] Patient Information 2015 [place], [place]. This information is not intended to replace advice given to you by your health care provider. Make sure you discuss any questions you have with your health care provider.    If you have been instructed to have an in-person evaluation today at a local Urgent Care facility, please use the link below. It will take you to a list of all of our available [facility], including address, phone number and hours of operation. Please do not delay care.  [facility]  If you or a family member do not have a primary care provider, use the link below to schedule a visit and establish care. When you choose a [facility] primary care physician or advanced practice provider, you gain a long-term partner in health. Find a Primary Care Provider  Learn more about [facility]'s in-office and virtual care options: [facility]

## 2021-05-30 NOTE — Telephone Encounter (Signed)
Attempted to contact the patient, no answer. LMOM 

## 2021-06-15 ENCOUNTER — Other Ambulatory Visit: Payer: Self-pay | Admitting: Physician Assistant

## 2021-06-15 DIAGNOSIS — B9789 Other viral agents as the cause of diseases classified elsewhere: Secondary | ICD-10-CM

## 2021-06-30 ENCOUNTER — Other Ambulatory Visit: Payer: Self-pay | Admitting: Gastroenterology

## 2021-06-30 DIAGNOSIS — R1310 Dysphagia, unspecified: Secondary | ICD-10-CM

## 2021-07-12 ENCOUNTER — Encounter: Payer: Self-pay | Admitting: Internal Medicine

## 2021-07-12 MED ORDER — METAXALONE 400 MG PO TABS: 400.0000 mg | ORAL_TABLET | Freq: Every day | ORAL | 0 refills | Status: DC | PRN

## 2021-07-12 MED ORDER — TRAMADOL HCL 50 MG PO TABS: 50.0000 mg | ORAL_TABLET | Freq: Every day | ORAL | 0 refills | Status: AC | PRN

## 2021-07-16 ENCOUNTER — Encounter: Payer: Self-pay | Admitting: Internal Medicine

## 2021-07-26 ENCOUNTER — Other Ambulatory Visit: Payer: Self-pay | Admitting: Internal Medicine

## 2021-07-26 DIAGNOSIS — K219 Gastro-esophageal reflux disease without esophagitis: Secondary | ICD-10-CM

## 2021-07-26 NOTE — Telephone Encounter (Signed)
Requested Prescriptions  Pending Prescriptions Disp Refills   pantoprazole (PROTONIX) 40 MG tablet [Pharmacy Med Name: PANTOPRAZOLE SOD DR 40 MG TAB] 60 tablet 5    Sig: TAKE 1 TABLET BY MOUTH TWICE A DAY     Gastroenterology: Proton Pump Inhibitors Passed - 07/26/2021  1:44 AM      Passed - Valid encounter within last 12 months    Recent Outpatient Visits          3 months ago Encounter for completion of form with patient   [facility], Coralie Keens, NP   4 months ago Encounter for general adult medical examination with abnormal findings   [facility] [place], Coralie Keens, NP   5 months ago Adjustment insomnia   [facility], Coralie Keens, NP   7 months ago Arthralgia of multiple joints   [facility] [place], Coralie Keens, [place]

## 2021-07-29 ENCOUNTER — Encounter: Payer: Self-pay | Admitting: Internal Medicine

## 2021-07-29 ENCOUNTER — Telehealth (INDEPENDENT_AMBULATORY_CARE_PROVIDER_SITE_OTHER): Payer: 59 | Admitting: Internal Medicine

## 2021-07-29 DIAGNOSIS — M791 Myalgia, unspecified site: Secondary | ICD-10-CM | POA: Diagnosis not present

## 2021-07-29 DIAGNOSIS — M2550 Pain in unspecified joint: Secondary | ICD-10-CM

## 2021-07-29 DIAGNOSIS — F439 Reaction to severe stress, unspecified: Secondary | ICD-10-CM

## 2021-07-29 DIAGNOSIS — Z8616 Personal history of COVID-19: Secondary | ICD-10-CM | POA: Diagnosis not present

## 2021-07-29 MED ORDER — CELECOXIB 100 MG PO CAPS: 100.0000 mg | ORAL_CAPSULE | Freq: Two times a day (BID) | ORAL | 2 refills | Status: DC

## 2021-07-29 MED ORDER — FAMOTIDINE 20 MG PO TABS: 20.0000 mg | ORAL_TABLET | Freq: Two times a day (BID) | ORAL | 1 refills | Status: DC | PRN

## 2021-07-29 NOTE — Patient Instructions (Signed)
Muscle Pain, Adult ?Muscle pain, also called myalgia, is a condition in which a person has pain in one or more muscles in the body. Muscle pain may be mild, moderate, or severe. It may feel sharp, achy, or burning. In most cases, the pain lasts only a short time and goes away without treatment. ?Muscle pain can result from using muscles in a new or different way or after a period of inactivity. It is normal to feel some muscle pain after starting an exercise program. Muscles that have not been used often will be sore at first. ?What are the causes? ?This condition is caused by using muscles in a new or different way after a period of inactivity. Other causes may include: ?Overuse or muscle strain, especially if you are not in shape. This is the most common cause of muscle pain. ?Injury or bruising. ?Infectious diseases, including diseases caused by viruses, such as the flu (influenza). ?Fibromyalgia.This is a long-term, or chronic, condition that causes muscle tenderness, tiredness (fatigue), and headache. ?Autoimmune or rheumatologic diseases. These are conditions, such as lupus, in which the body's defense system (immunesystem) attacks areas in the body. ?Certain medicines, including ACE inhibitors and statins. ?What are the signs or symptoms? ?The main symptom of this condition is sore or painful muscles, including during activity and when stretching. You may also have slight swelling. ?How is this diagnosed? ?This condition is diagnosed with a physical exam. Your health care provider will ask questions about your pain and when it began. If you have not had muscle pain for very long, your health care provider may want to wait before doing much testing. If your muscle pain has lasted a long time, tests may be done right away. In some cases, this may include tests to rule out certain conditions or illnesses. ?How is this treated? ?Treatment for this condition depends on the cause. Home care is often enough to  relieve muscle pain. Your health care provider may also prescribe NSAIDs, such as ibuprofen. ?Follow these instructions at home: ?Medicines ?Take over-the-counter and prescription medicines only as told by your health care provider. ?Ask your health care provider if the medicine prescribed to you requires you to avoid driving or using machinery. ?Managing pain, swelling, and discomfort ?  ?If directed, put ice on the painful area. To do this: ?Put ice in a plastic bag. ?Place a towel between your skin and the bag. ?Leave the ice on for 20 minutes, 2-3 times a day. ?For the first 2 days of muscle soreness, or if there is swelling: ?Do not soak in hot baths. ?Do not use a hot tub, steam room, sauna, heating pad, or other heat source. ?After 48-72 hours, you may alternate between applying ice and applying heat as told by your health care provider. If directed, apply heat to the affected area as often as told by your health care provider. Use the heat source that your health care provider recommends, such as a moist heat pack or a heating pad. ?Place a towel between your skin and the heat source. ?Leave the heat on for 20-30 minutes. ?Remove the heat if your skin turns bright red. This is especially important if you are unable to feel pain, heat, or cold. You may have a greater risk of getting burned. ?If you have an injury, raise (elevate) the injured area above the level of your heart while you are sitting or lying down. ?Activity ? ?If overuse is causing your muscle pain: ?Slow down your activities   until the pain goes away. ?Do regular, gentle exercises if you are not usually active. ?Warm up before exercising. Stretch before and after exercising. This can help lower the risk of muscle pain. ?Do not continue working out if the pain is severe. Severe pain could mean that you have injured a muscle. ?Do not lift anything that is heavier than 5-10 lb (2.3-4.5 kg), or the limit that you are told, until your health care  provider says that it is safe. ?Return to your normal activities as told by your health care provider. Ask your health care provider what activities are safe for you. ?General instructions ?Do not use any products that contain nicotine or tobacco, such as cigarettes, e-cigarettes, and chewing tobacco. These can delay healing. If you need help quitting, ask your health care provider. ?Keep all follow-up visits as told by your health care provider. This is important. ?Contact a health care provider if you have: ?Muscle pain that gets worse and medicines do not help. ?Muscle pain that lasts longer than 3 days. ?A rash or fever along with muscle pain. ?Muscle pain after a tick bite. ?Muscle pain while working out, even though you are in good physical condition. ?Redness, soreness, or swelling along with muscle pain. ?Muscle pain after starting a new medicine or changing the dose of a medicine. ?Get help right away if you have: ?Trouble breathing. ?Trouble swallowing. ?Muscle pain along with a stiff neck, fever, and vomiting. ?Severe muscle weakness or you cannot move part of your body. ?These symptoms may represent a serious problem that is an emergency. Do not wait to see if the symptoms will go away. Get medical help right away. Call your local emergency services (911 in the [place] Do not drive yourself to the hospital. ?Summary ?Muscle pain usually lasts only a short time and goes away without treatment. ?This condition is caused by using muscles in a new or different way after a period of inactivity. ?If your muscle pain lasts longer than 3 days, tell your health care provider. ?This information is not intended to replace advice given to you by your health care provider. Make sure you discuss any questions you have with your health care provider. ?Document Revised: 12/21/2020 Document Reviewed: 12/21/2020 ?Elsevier Patient Education ? 2022 [employer] ? ?

## 2021-07-29 NOTE — Progress Notes (Signed)
Virtual Visit via Video Note  I connected with [name] on 07/29/21 at  4:00 PM EST by a video enabled telemedicine application and verified that I am speaking with the correct person using two identifiers.  Location: Patient: Home Provider: Office  Person's participating in this video call: Webb Silversmith, NP and Lorna Dibble.   I discussed the limitations of evaluation and management by telemedicine and the availability of in person appointments. The patient expressed understanding and agreed to proceed.  History of Present Illness:  Pt reports body aches and joint pains. This started 2 months ago after getting covid but she reports the pain has been worse in the last few weeks.  She describes the pain as sore and achy but can be throbbing at times.  She reports associated tingling and burning in her extremities but denies numbness.  She has some swelling in her ankles but denies overt joint swelling.  She has not noticed any strange rashes.  She has been more active lately having to care for her husband who recently had surgery for prostate cancer and helping her daughter with her infant, in addition to working.  She has been taking Skelaxin, Tylenol and Tramadol as prescribed with minimal relief of symptoms.    Past Medical History:  Diagnosis Date   Allergy    DDD (degenerative disc disease), lumbar    Depression     Current Outpatient Medications  Medication Sig Dispense Refill   ALLEGRA-D ALLERGY & CONGESTION 180-240 MG 24 hr tablet TAKE 1 TABLET BY MOUTH EVERY DAY AS NEEDED 30 tablet 1   atorvastatin (LIPITOR) 10 MG tablet Take 1 tablet (10 mg total) by mouth daily. 90 tablet 0   benzonatate (TESSALON) 100 MG capsule Take 1-2 capsules (100-200 mg total) by mouth 3 (three) times daily as needed for cough. 30 capsule 0   brompheniramine-pseudoephedrine-DM 30-2-10 MG/5ML syrup Take 5 mLs by mouth 4 (four) times daily as needed. 120 mL 0   diclofenac Sodium (VOLTAREN) 1  % GEL APPLY 2 GRAMS TO AFFECTED AREA 4 TIMES A DAY 100 g 0   famotidine (PEPCID) 20 MG tablet TAKE 1 TABLET (20 MG TOTAL) BY MOUTH 2 (TWO) TIMES DAILY AS NEEDED FOR HEARTBURN OR INDIGESTION. (Patient taking differently: Take 20 mg by mouth 2 (two) times daily.) 180 tablet 1   ipratropium (ATROVENT) 0.03 % nasal spray Place 2 sprays into both nostrils every 12 (twelve) hours. 30 mL 0   metaxalone (SKELAXIN) 400 MG tablet Take 1 tablet (400 mg total) by mouth daily as needed. 30 tablet 0   Olopatadine HCl 0.2 % SOLN Apply 1 drop to eye in the morning and at bedtime. 2.5 mL 0   pantoprazole (PROTONIX) 40 MG tablet TAKE 1 TABLET BY MOUTH TWICE A DAY 60 tablet 5   traMADol (ULTRAM) 50 MG tablet Take 1 tablet (50 mg total) by mouth daily as needed. 30 tablet 0   No current facility-administered medications for this visit.    Allergies  Allergen Reactions   Gabapentin Other (See Comments)    Hair loss   Hydrochlorothiazide Other (See Comments)    The pt state she couldn't function and felt like she was dying    Penicillins Other (See Comments)    Childhood allergy Has patient had a PCN reaction causing immediate rash, facial/tongue/throat swelling, SOB or lightheadedness with hypotension: Unknown Has patient had a PCN reaction causing severe rash involving mucus membranes or skin necrosis: Unknown Has patient had a PCN  reaction that required hospitalization: No  Has patient had a PCN reaction occurring within the last 10 years: No  If all of the above answers are "NO", then may proceed with Cephalosporin use.    Orange Juice [Orange Oil] Rash    rash    Family History  Problem Relation Age of Onset   Hyperlipidemia Mother    Heart disease Maternal Aunt    Stroke Maternal Aunt    Diabetes Maternal Aunt    Heart disease Maternal Uncle    Stroke Maternal Uncle    Cancer Neg Hx     Social History   Socioeconomic History   Marital status: Married    Spouse name: Not on file   Number  of children: Not on file   Years of education: Not on file   Highest education level: Not on file  Occupational History   Not on file  Tobacco Use   Smoking status: Never   Smokeless tobacco: Never  Vaping Use   Vaping Use: Never used  Substance and Sexual Activity   Alcohol use: Yes    Comment: social   Drug use: No   Sexual activity: Yes    Partners: Male    Birth control/protection: Post-menopausal, Surgical  Other Topics Concern   Not on file  Social History Narrative   Not on file   Social Determinants of Health   Financial Resource Strain: Not on file  Food Insecurity: Not on file  Transportation Needs: Not on file  Physical Activity: Not on file  Stress: Not on file  Social Connections: Not on file  Intimate Partner Violence: Not on file     Constitutional: Pt reports fatigue. Denies fever, malaise, headache or abrupt weight changes.  Respiratory: Denies difficulty breathing, shortness of breath, cough or sputum production.   Cardiovascular: Denies chest pain, chest tightness, palpitations or swelling in the hands or feet.  Musculoskeletal: Pt reports body aches, joint pain. Denies decrease in range of motion, difficulty with gait, or joint swelling.  Skin: Denies redness, rashes, lesions or ulcercations.  Neurological: Pt reports tinging and burning of upper and lower extremities. Denies numbness or problems with balance and coordination.  Psych: Pt reports stress, has a history of anxiety and depression. Denies SI/HI.  No other specific complaints in a complete review of systems (except as listed in HPI above).  Observations/Objective:   Wt Readings from Last 3 Encounters:  03/28/21 264 lb 9.6 oz (120 kg)  12/28/20 257 lb 3.2 oz (116.7 kg)  05/21/20 239 lb (108.4 kg)    General: Appears her stated age, obese, in NAD. HEENT: Head: normal shape and size;  Pulmonary/Chest: Normal effort. No respiratory distress Musculoskeletal:  Neurological: Alert and  oriented.  Psychiatric: Mood and affect mildly flat. Behavior is normal. Judgment and thought content normal.   BMET    Component Value Date/Time   NA 140 03/28/2021 0838   NA 142 09/18/2016 0929   K 4.0 03/28/2021 0838   CL 107 03/28/2021 0838   CO2 24 03/28/2021 0838   GLUCOSE 119 (H) 03/28/2021 0838   BUN 12 03/28/2021 0838   BUN 9 09/18/2016 0929   CREATININE 0.74 03/28/2021 0838   CALCIUM 9.6 03/28/2021 0838   GFRNONAA >60 12/01/2019 1558   GFRAA >60 12/01/2019 1558    Lipid Panel     Component Value Date/Time   CHOL 236 (H) 03/28/2021 0838   CHOL 200 (H) 03/23/2017 0844   TRIG 88 03/28/2021 0838   HDL  73 03/28/2021 0838   HDL 57 03/23/2017 0844   CHOLHDL 3.2 03/28/2021 0838   VLDL 36.2 03/19/2020 0918   LDLCALC 144 (H) 03/28/2021 0838    CBC    Component Value Date/Time   WBC 5.7 03/28/2021 0838   RBC 4.10 03/28/2021 0838   HGB 13.2 03/28/2021 0838   HGB 13.2 09/18/2016 0929   HCT 39.6 03/28/2021 0838   HCT 38.1 09/18/2016 0929   PLT 212 03/28/2021 0838   PLT 268 09/18/2016 0929   MCV 96.6 03/28/2021 0838   MCV 94 09/18/2016 0929   MCH 32.2 03/28/2021 0838   MCHC 33.3 03/28/2021 0838   RDW 12.4 03/28/2021 0838   RDW 12.8 09/18/2016 0929   LYMPHSABS 2.8 08/30/2019 0315   LYMPHSABS 2.8 08/03/2014 1609   MONOABS 0.6 08/30/2019 0315   EOSABS 0.1 08/30/2019 0315   EOSABS 0.1 08/03/2014 1609   BASOSABS 0.0 08/30/2019 0315   BASOSABS 0.0 08/03/2014 1609    Hgb A1C Lab Results  Component Value Date   HGBA1C 5.8 (H) 03/28/2021       Assessment and Plan:  Myalgias, Multiple Joint Pain, Fatigue, Stress, History of Covid 19:  She is only taking 200 mg of Skelaxin at bedtime, advised her to increase this to 400 mg at bedtime Rx for Celebrex 100 mg 2 times daily Continue Tylenol Arthritis 650 mg 3 times daily Encourage stretching We will check CBC, c-Met, ANA, ESR, CRP and CK  We will follow-up after labs with further recommendation and treatment  plan Follow Up Instructions:    I discussed the assessment and treatment plan with the patient. The patient was provided an opportunity to ask questions and all were answered. The patient agreed with the plan and demonstrated an understanding of the instructions.   The patient was advised to call back or seek an in-person evaluation if the symptoms worsen or if the condition fails to improve as anticipated.    Webb Silversmith, NP

## 2021-07-30 ENCOUNTER — Other Ambulatory Visit: Payer: Self-pay

## 2021-07-30 ENCOUNTER — Encounter: Payer: Self-pay | Admitting: Internal Medicine

## 2021-07-30 DIAGNOSIS — M2550 Pain in unspecified joint: Secondary | ICD-10-CM

## 2021-07-30 DIAGNOSIS — F439 Reaction to severe stress, unspecified: Secondary | ICD-10-CM

## 2021-07-30 DIAGNOSIS — Z8616 Personal history of COVID-19: Secondary | ICD-10-CM

## 2021-07-30 DIAGNOSIS — M791 Myalgia, unspecified site: Secondary | ICD-10-CM

## 2021-07-31 ENCOUNTER — Other Ambulatory Visit: Payer: Self-pay

## 2021-07-31 ENCOUNTER — Other Ambulatory Visit: Payer: 59

## 2021-07-31 MED ORDER — METAXALONE 400 MG PO TABS: 400.0000 mg | ORAL_TABLET | Freq: Every day | ORAL | 2 refills | Status: DC | PRN

## 2021-08-01 ENCOUNTER — Encounter: Payer: Self-pay | Admitting: Internal Medicine

## 2021-08-01 MED ORDER — METAXALONE 800 MG PO TABS: 800.0000 mg | ORAL_TABLET | Freq: Every day | ORAL | 1 refills | Status: DC | PRN

## 2021-08-03 LAB — CBC
HCT: 37.1 % (ref 35.0–45.0)
Hemoglobin: 12.6 g/dL (ref 11.7–15.5)
MCH: 31.7 pg (ref 27.0–33.0)
MCHC: 34 g/dL (ref 32.0–36.0)
MCV: 93.5 fL (ref 80.0–100.0)
MPV: 11.6 fL (ref 7.5–12.5)
Platelets: 216 10*3/uL (ref 140–400)
RBC: 3.97 10*6/uL (ref 3.80–5.10)
RDW: 12.8 % (ref 11.0–15.0)
WBC: 6.1 10*3/uL (ref 3.8–10.8)

## 2021-08-03 LAB — ANTI-NUCLEAR AB-TITER (ANA TITER)
ANA TITER: 1:40 {titer} — ABNORMAL HIGH
ANA Titer 1: 1:80 {titer} — ABNORMAL HIGH

## 2021-08-03 LAB — ANA: Anti Nuclear Antibody (ANA): POSITIVE — AB

## 2021-08-03 LAB — COMPLETE METABOLIC PANEL WITH GFR
AG Ratio: 1.2 (calc) (ref 1.0–2.5)
ALT: 28 U/L (ref 6–29)
AST: 23 U/L (ref 10–35)
Albumin: 4.1 g/dL (ref 3.6–5.1)
Alkaline phosphatase (APISO): 64 U/L (ref 37–153)
BUN: 15 mg/dL (ref 7–25)
CO2: 27 mmol/L (ref 20–32)
Calcium: 9.5 mg/dL (ref 8.6–10.4)
Chloride: 110 mmol/L (ref 98–110)
Creat: 0.74 mg/dL (ref 0.50–1.03)
Globulin: 3.3 g/dL (calc) (ref 1.9–3.7)
Glucose, Bld: 81 mg/dL (ref 65–139)
Potassium: 3.9 mmol/L (ref 3.5–5.3)
Sodium: 143 mmol/L (ref 135–146)
Total Bilirubin: 0.4 mg/dL (ref 0.2–1.2)
Total Protein: 7.4 g/dL (ref 6.1–8.1)
eGFR: 95 mL/min/{1.73_m2} (ref >=60)

## 2021-08-03 LAB — C-REACTIVE PROTEIN: CRP: 6.3 mg/L (ref <8.0)

## 2021-08-03 LAB — SEDIMENTATION RATE: Sed Rate: 22 mm/h (ref 0–30)

## 2021-08-03 LAB — CK: Total CK: 65 U/L (ref 29–143)

## 2021-08-07 ENCOUNTER — Telehealth: Payer: 59 | Admitting: Internal Medicine

## 2021-08-10 ENCOUNTER — Encounter: Payer: Self-pay | Admitting: Internal Medicine

## 2021-08-10 DIAGNOSIS — M791 Myalgia, unspecified site: Secondary | ICD-10-CM

## 2021-08-10 DIAGNOSIS — G8929 Other chronic pain: Secondary | ICD-10-CM

## 2021-08-10 DIAGNOSIS — R768 Other specified abnormal immunological findings in serum: Secondary | ICD-10-CM

## 2021-08-15 ENCOUNTER — Telehealth: Payer: Self-pay

## 2021-08-15 DIAGNOSIS — R768 Other specified abnormal immunological findings in serum: Secondary | ICD-10-CM

## 2021-08-15 NOTE — Telephone Encounter (Signed)
Copied from [place] [phone]. Topic: Referral - Status ?>> Aug 14, 2021  5:23 PM Erick Blinks wrote: ?[phone] pt called to check status of referral, please advise. Requesting a call back ?

## 2021-08-19 NOTE — Telephone Encounter (Signed)
Pt wanted to know if a new referral could be sent into [facility], please advise.  ?

## 2021-08-19 NOTE — Telephone Encounter (Signed)
Is it okay to change her referral to [facility]? ? ?Thanks,  ? ?-Mickel Baas  ?

## 2021-08-20 NOTE — Telephone Encounter (Signed)
Yes, referral placed

## 2021-08-23 ENCOUNTER — Other Ambulatory Visit: Payer: Self-pay | Admitting: Internal Medicine

## 2021-08-26 NOTE — Telephone Encounter (Signed)
Requested medication (s) are due for refill today:   No ? ?Requested medication (s) are on the active medication list:   No ? ?Future visit scheduled:    ? ? ?Last ordered: Discontinued 12/31/2020 by Webb Silversmith ? ?Returned because not on med list but a refill request came in.  ? ?Requested Prescriptions  ?Pending Prescriptions Disp Refills  ? hydrOXYzine (ATARAX) 25 MG tablet [Pharmacy Med Name: HYDROXYZINE HCL 25 MG TABLET] 30 tablet 2  ?  Sig: TAKE 1 TABLET BY MOUTH DAILY AS NEEDED.  ?  ? There is no refill protocol information for this order  ?  ? ?

## 2021-09-01 ENCOUNTER — Encounter: Payer: Self-pay | Admitting: Internal Medicine

## 2021-09-02 MED ORDER — TRAMADOL HCL 50 MG PO TABS: 50.0000 mg | ORAL_TABLET | Freq: Every day | ORAL | 0 refills | Status: AC | PRN

## 2021-09-02 MED ORDER — HYDROXYZINE PAMOATE 25 MG PO CAPS: 25.0000 mg | ORAL_CAPSULE | Freq: Every day | ORAL | 0 refills | Status: DC | PRN

## 2021-09-24 ENCOUNTER — Other Ambulatory Visit: Payer: Self-pay | Admitting: Internal Medicine

## 2021-09-25 NOTE — Telephone Encounter (Signed)
Requested medication (s) are due for refill today: yes ? ?Requested medication (s) are on the active medication list: yes ? ?Last refill:  09/02/20 #30 and 0 RF ? ?Future visit scheduled: no, has appt with rheumatologist  ? ?Notes to clinic:  pharm requests as follows: Pharmacy comment: REQUEST FOR 90 DAYS PRESCRIPTION. ? ? ?  ? ?Requested Prescriptions  ?Pending Prescriptions Disp Refills  ? hydrOXYzine (VISTARIL) 25 MG capsule [Pharmacy Med Name: HYDROXYZINE PAM 25 MG CAP] 90 capsule 1  ?  Sig: Take 1 capsule (25 mg total) by mouth daily as needed.  ?  ? Ear, Nose, and Throat:  Antihistamines 2 Passed - 09/24/2021 12:33 PM  ?  ?  Passed - Cr in normal range and within 360 days  ?  Creat  ?Date Value Ref Range Status  ?07/31/2021 0.74 0.50 - 1.03 mg/dL Final  ?  ?  ?  ?  Passed - Valid encounter within last 12 months  ?  Recent Outpatient Visits   ? ?      ? 1 month ago Myalgia  ? [facility], Coralie Keens, NP  ? 5 months ago Encounter for completion of form with patient  ? [facility], Coralie Keens, NP  ? 6 months ago Encounter for general adult medical examination with abnormal findings  ? [facility] [place], [place] W, NP  ? 7 months ago Adjustment insomnia  ? [facility] [place], [place] W, NP  ? 9 months ago Arthralgia of multiple joints  ? [facility], Coralie Keens, NP  ? ?  ?  ?Future Appointments   ? ?        ? In 2 months Bo Merino, MD [facility]  ? ?  ? ?  ?  ?  ? ? ?

## 2021-10-03 NOTE — Progress Notes (Signed)
? ?Office Visit Note ? ?Patient: [name]             ?Date of Birth: [birth year]           ?MRN: [id number]             ?PCP: Jearld Fenton, NP ?Referring: Jearld Fenton, NP ?Visit Date: 10/14/2021 ?Occupation: '@GUAROCC'$ @ ? ?Subjective:  ?Pain in multiple joints and muscles ? ?History of Present Illness: [name] is a 56 y.o. female seen in consultation per request of her PCP.  According to the patient about 2 years ago she started having pain in her both hands.  She was seen by rheumatologist in [place] who did labs and x-rays and did not give her a diagnosis.  She states that gradually the pain got worse.  Last year the pain became worse in her hands and her knee joints.  She was seen by an orthopedic surgeon at [facility] where she was given the diagnosis of osteoarthritis.  As she was having discomfort in her right wrist joint she was also given a injection in her right wrist joint in September 2022 which helped her for short duration.  She states in January 2023 she was seen again by the orthopedic surgeon due to ongoing pain and discomfort and decreased grip strength.  She had a cortisone injection in her right knee joint which helped.  She states over time the pain is getting worse.  She has been experiencing burning sensation all over her body.  All of her muscles are painful.  She also states that any touch causes increased pain.  She has been experiencing nocturnal pain and insomnia due to increased discomfort.  She was given Celebrex and Skelaxin by her PCP which helps minimally.  She feels that her hands may be swollen.  She complains of pain and discomfort in her bilateral hands, bilateral knees and bilateral feet.  She also has neck and lower back pain.  She went to pain management due to lower back pain.  She states she has had injections in her lower back.  She was told that she has degenerative disc disease.  She takes tramadol on as needed basis.  She has tingling  sensation in her bilateral hands.  There is positive family history of rheumatoid arthritis in her sister and nephew.  There is no family history of fibromyalgia.  She is gravida 4, para 2, miscarriage 1, abortion 1.  There is no history of DVTs. ? ?Activities of Daily Living:  ?Patient reports morning stiffness for 45 minutes.   ?Patient Reports nocturnal pain.  ?Difficulty dressing/grooming: Reports ?Difficulty climbing stairs: Reports ?Difficulty getting out of chair: Reports ?Difficulty using hands for taps, buttons, cutlery, and/or writing: Reports ? ?Review of Systems  ?Constitutional:  Positive for fatigue.  ?HENT:  Positive for mouth dryness and nose dryness. Negative for mouth sores.   ?     Nose sores  ?Eyes:  Positive for dryness. Negative for pain and itching.  ?Respiratory:  Positive for shortness of breath. Negative for difficulty breathing.   ?     Since COVID-19 infection in December 2022  ?Cardiovascular:  Positive for palpitations. Negative for chest pain.  ?Gastrointestinal:  Negative for blood in stool, constipation and diarrhea.  ?Endocrine: Negative for increased urination.  ?Genitourinary:  Negative for difficulty urinating.  ?Musculoskeletal:  Positive for joint pain, joint pain, myalgias, morning stiffness, muscle tenderness and myalgias. Negative for joint swelling.  ?Skin:  Negative for  color change, rash, redness and sensitivity to sunlight.  ?Allergic/Immunologic: Negative for susceptible to infections.  ?Neurological:  Positive for headaches, parasthesias and weakness. Negative for dizziness and numbness.  ?Hematological:  Negative for bruising/bleeding tendency.  ?Psychiatric/Behavioral:  Positive for depressed mood and sleep disturbance. Negative for confusion. The patient is nervous/anxious.   ? ?PMFS History:  ?Patient Active Problem List  ? Diagnosis Date Noted  ? DDD (degenerative disc disease), lumbar 10/14/2021  ? Caregiver stress 02/14/2021  ? Insomnia 02/14/2021  ? Prediabetes  12/31/2020  ? HLD (hyperlipidemia) 12/31/2020  ? Frequent headaches 12/31/2020  ? Morbid obesity ([place]) 12/31/2020  ? Gastroesophageal reflux disease without esophagitis 11/15/2018  ? Arthralgia of multiple joints 06/02/2016  ? Anxiety and depression 08/03/2014  ?  ?Past Medical History:  ?Diagnosis Date  ? Allergy   ? DDD (degenerative disc disease), lumbar   ? Depression   ?  ?Family History  ?Problem Relation Age of Onset  ? Hyperlipidemia Mother   ? Rheum arthritis Sister   ? Osteoarthritis Sister   ? Fibroids Sister   ? Fibroids Sister   ? Fibroids Sister   ? Heart disease Maternal Aunt   ? Stroke Maternal Aunt   ? Diabetes Maternal Aunt   ? Heart disease Maternal Uncle   ? Stroke Maternal Uncle   ? Rheum arthritis Nephew   ? Cancer Neg Hx   ? ?Past Surgical History:  ?Procedure Laterality Date  ? ABDOMINAL HYSTERECTOMY  2008  ? LAVH, endometriosis ablation, mccall culdoplasty  ? CHOLECYSTECTOMY N/A 08/31/2019  ? Procedure: LAPAROSCOPIC CHOLECYSTECTOMY;  Surgeon: Olean Ree, MD;  Location: [facility] ORS;  Service: General;  Laterality: N/A;  ? DIAGNOSTIC LAPAROSCOPY  2002  ? Hysteroscopy, diagnostic laparoscopy, extensive lysis of adhesions, endometrial ablation, RSO  ? RIGHT OOPHORECTOMY  2007  ? with right salpingectomy  ? TUBAL LIGATION    ? ?Social History  ? ?Social History Narrative  ? Not on file  ? ?Immunization History  ?Administered Date(s) Administered  ? Influenza,inj,Quad PF,6+ Mos 03/17/2015, 03/17/2017, 02/25/2019, 03/19/2020, 03/28/2021  ? PFIZER(Purple Top)SARS-COV-2 Vaccination 07/11/2019, 08/01/2019  ? Tdap 02/09/2007, 09/18/2016  ?  ? ?Objective: ?Vital Signs: BP 128/85 (BP Location: Right Arm, Patient Position: Sitting, Cuff Size: Large)   Pulse 77   Ht '5\' 5"'$  (1.651 m)   Wt 272 lb 12.8 oz (123.7 kg)   BMI 45.40 kg/m?   ? ?Physical Exam ?Vitals and nursing note reviewed.  ?Constitutional:   ?   Appearance: She is well-developed.  ?HENT:  ?   Head: Normocephalic and atraumatic.  ?Eyes:  ?    Conjunctiva/sclera: Conjunctivae normal.  ?Cardiovascular:  ?   Rate and Rhythm: Normal rate and regular rhythm.  ?   Heart sounds: Normal heart sounds.  ?Pulmonary:  ?   Effort: Pulmonary effort is normal.  ?   Breath sounds: Normal breath sounds.  ?Abdominal:  ?   General: Bowel sounds are normal.  ?   Palpations: Abdomen is soft.  ?Musculoskeletal:  ?   Cervical back: Normal range of motion.  ?Lymphadenopathy:  ?   Cervical: No cervical adenopathy.  ?Skin: ?   General: Skin is warm and dry.  ?   Capillary Refill: Capillary refill takes less than 2 seconds.  ?Neurological:  ?   Mental Status: She is alert and oriented to person, place, and time.  ?Psychiatric:     ?   Behavior: Behavior normal.  ?  ? ?Musculoskeletal Exam: She had limited painful range of motion  of the cervical spine.  She has limited painful range of motion of the lumbar spine.  Shoulder joints, elbow joints, wrist joints with good range of motion.  She has hypermobility in her wrist joints, MCPs and PIPs and DIPs.  No synovitis was noted.  Hip joints and knee joints in good range of motion.  No warmth swelling or effusion was noted.  There was no tenderness over ankles or MTPs.  She has generalized hyperalgesia and positive tender points. ? ?CDAI Exam: ?CDAI Score: -- ?Patient Global: --; Provider Global: -- ?Swollen: --; Tender: -- ?Joint Exam 10/14/2021  ? ?No joint exam has been documented for this visit  ? ?There is currently no information documented on the homunculus. Go to the Rheumatology activity and complete the homunculus joint exam. ? ?Investigation: ?No additional findings. ? ?Imaging: ?No results found. ? ?Recent Labs: ?Lab Results  ?Component Value Date  ? WBC 6.1 07/31/2021  ? HGB 12.6 07/31/2021  ? PLT 216 07/31/2021  ? NA 143 07/31/2021  ? K 3.9 07/31/2021  ? CL 110 07/31/2021  ? CO2 27 07/31/2021  ? GLUCOSE 81 07/31/2021  ? BUN 15 07/31/2021  ? CREATININE 0.74 07/31/2021  ? BILITOT 0.4 07/31/2021  ? ALKPHOS 62 03/19/2020  ?  AST 23 07/31/2021  ? ALT 28 07/31/2021  ? PROT 7.4 07/31/2021  ? ALBUMIN 3.9 03/19/2020  ? CALCIUM 9.5 07/31/2021  ? GFRAA >60 12/01/2019  ? ? ?Speciality Comments: No specialty comments available. ? ?Procedu

## 2021-10-10 ENCOUNTER — Encounter: Payer: Self-pay | Admitting: Internal Medicine

## 2021-10-10 MED ORDER — TRAMADOL HCL 50 MG PO TABS: 50.0000 mg | ORAL_TABLET | Freq: Every day | ORAL | 0 refills | Status: AC | PRN

## 2021-10-14 ENCOUNTER — Ambulatory Visit (INDEPENDENT_AMBULATORY_CARE_PROVIDER_SITE_OTHER): Payer: 59

## 2021-10-14 ENCOUNTER — Ambulatory Visit: Payer: 59 | Admitting: Rheumatology

## 2021-10-14 ENCOUNTER — Encounter: Payer: Self-pay | Admitting: Internal Medicine

## 2021-10-14 ENCOUNTER — Encounter: Payer: Self-pay | Admitting: Rheumatology

## 2021-10-14 VITALS — BP 128/85 | HR 77 | Ht 65.0 in | Wt 272.8 lb

## 2021-10-14 DIAGNOSIS — R768 Other specified abnormal immunological findings in serum: Secondary | ICD-10-CM | POA: Diagnosis not present

## 2021-10-14 DIAGNOSIS — R202 Paresthesia of skin: Secondary | ICD-10-CM | POA: Diagnosis not present

## 2021-10-14 DIAGNOSIS — F32A Depression, unspecified: Secondary | ICD-10-CM

## 2021-10-14 DIAGNOSIS — M79642 Pain in left hand: Secondary | ICD-10-CM | POA: Diagnosis not present

## 2021-10-14 DIAGNOSIS — M25561 Pain in right knee: Secondary | ICD-10-CM

## 2021-10-14 DIAGNOSIS — M25562 Pain in left knee: Secondary | ICD-10-CM

## 2021-10-14 DIAGNOSIS — G8929 Other chronic pain: Secondary | ICD-10-CM

## 2021-10-14 DIAGNOSIS — M79672 Pain in left foot: Secondary | ICD-10-CM | POA: Diagnosis not present

## 2021-10-14 DIAGNOSIS — M542 Cervicalgia: Secondary | ICD-10-CM

## 2021-10-14 DIAGNOSIS — K219 Gastro-esophageal reflux disease without esophagitis: Secondary | ICD-10-CM

## 2021-10-14 DIAGNOSIS — M2550 Pain in unspecified joint: Secondary | ICD-10-CM

## 2021-10-14 DIAGNOSIS — M797 Fibromyalgia: Secondary | ICD-10-CM

## 2021-10-14 DIAGNOSIS — M79671 Pain in right foot: Secondary | ICD-10-CM | POA: Diagnosis not present

## 2021-10-14 DIAGNOSIS — F5102 Adjustment insomnia: Secondary | ICD-10-CM

## 2021-10-14 DIAGNOSIS — M79641 Pain in right hand: Secondary | ICD-10-CM

## 2021-10-14 DIAGNOSIS — R7303 Prediabetes: Secondary | ICD-10-CM

## 2021-10-14 DIAGNOSIS — F419 Anxiety disorder, unspecified: Secondary | ICD-10-CM

## 2021-10-14 DIAGNOSIS — M51369 Other intervertebral disc degeneration, lumbar region without mention of lumbar back pain or lower extremity pain: Secondary | ICD-10-CM | POA: Insufficient documentation

## 2021-10-14 DIAGNOSIS — R519 Headache, unspecified: Secondary | ICD-10-CM

## 2021-10-14 DIAGNOSIS — M5136 Other intervertebral disc degeneration, lumbar region: Secondary | ICD-10-CM

## 2021-10-14 DIAGNOSIS — E781 Pure hyperglyceridemia: Secondary | ICD-10-CM

## 2021-10-14 DIAGNOSIS — M791 Myalgia, unspecified site: Secondary | ICD-10-CM

## 2021-10-14 DIAGNOSIS — M249 Joint derangement, unspecified: Secondary | ICD-10-CM

## 2021-10-14 DIAGNOSIS — J302 Other seasonal allergic rhinitis: Secondary | ICD-10-CM

## 2021-10-14 NOTE — Patient Instructions (Addendum)
Hand Exercises ?Hand exercises can be helpful for almost anyone. These exercises can strengthen the hands, improve flexibility and movement, and increase blood flow to the hands. These results can make work and daily tasks easier. ?Hand exercises can be especially helpful for people who have joint pain from arthritis or have nerve damage from overuse (carpal tunnel syndrome). ?These exercises can also help people who have injured a hand. ?Exercises ?Most of these hand exercises are gentle stretching and motion exercises. It is usually safe to do them often throughout the day. Warming up your hands before exercise may help to reduce stiffness. You can do this with gentle massage or by placing your hands in warm water for 10-15 minutes. ?It is normal to feel some stretching, pulling, tightness, or mild discomfort as you begin new exercises. This will gradually improve. Stop an exercise right away if you feel sudden, severe pain or your pain gets worse. Ask your health care provider which exercises are best for you. ?Knuckle bend or "claw" fist ? ?Stand or sit with your arm, hand, and all five fingers pointed straight up. Make sure to keep your wrist straight during the exercise. ?Gently bend your fingers down toward your palm until the tips of your fingers are touching the top of your palm. Keep your big knuckle straight and just bend the small knuckles in your fingers. ?Hold this position for __________ seconds. ?Straighten (extend) your fingers back to the starting position. ?Repeat this exercise 5-10 times with each hand. ?Full finger fist ? ?Stand or sit with your arm, hand, and all five fingers pointed straight up. Make sure to keep your wrist straight during the exercise. ?Gently bend your fingers into your palm until the tips of your fingers are touching the middle of your palm. ?Hold this position for __________ seconds. ?Extend your fingers back to the starting position, stretching every joint fully. ?Repeat  this exercise 5-10 times with each hand. ?Straight fist ?Stand or sit with your arm, hand, and all five fingers pointed straight up. Make sure to keep your wrist straight during the exercise. ?Gently bend your fingers at the big knuckle, where your fingers meet your hand, and the middle knuckle. Keep the knuckle at the tips of your fingers straight and try to touch the bottom of your palm. ?Hold this position for __________ seconds. ?Extend your fingers back to the starting position, stretching every joint fully. ?Repeat this exercise 5-10 times with each hand. ?Tabletop ? ?Stand or sit with your arm, hand, and all five fingers pointed straight up. Make sure to keep your wrist straight during the exercise. ?Gently bend your fingers at the big knuckle, where your fingers meet your hand, as far down as you can while keeping the small knuckles in your fingers straight. Think of forming a tabletop with your fingers. ?Hold this position for __________ seconds. ?Extend your fingers back to the starting position, stretching every joint fully. ?Repeat this exercise 5-10 times with each hand. ?Finger spread ? ?Place your hand flat on a table with your palm facing down. Make sure your wrist stays straight as you do this exercise. ?Spread your fingers and thumb apart from each other as far as you can until you feel a gentle stretch. Hold this position for __________ seconds. ?Bring your fingers and thumb tight together again. Hold this position for __________ seconds. ?Repeat this exercise 5-10 times with each hand. ?Making circles ? ?Stand or sit with your arm, hand, and all five fingers pointed   straight up. Make sure to keep your wrist straight during the exercise. ?Make a circle by touching the tip of your thumb to the tip of your index finger. ?Hold for __________ seconds. Then open your hand wide. ?Repeat this motion with your thumb and each finger on your hand. ?Repeat this exercise 5-10 times with each hand. ?Thumb  motion ? ?Sit with your forearm resting on a table and your wrist straight. Your thumb should be facing up toward the ceiling. Keep your fingers relaxed as you move your thumb. ?Lift your thumb up as high as you can toward the ceiling. Hold for __________ seconds. ?Bend your thumb across your palm as far as you can, reaching the tip of your thumb for the small finger (pinkie) side of your palm. Hold for __________ seconds. ?Repeat this exercise 5-10 times with each hand. ?Grip strengthening ? ?Hold a stress ball or other soft ball in the middle of your hand. ?Slowly increase the pressure, squeezing the ball as much as you can without causing pain. Think of bringing the tips of your fingers into the middle of your palm. All of your finger joints should bend when doing this exercise. ?Hold your squeeze for __________ seconds, then relax. ?Repeat this exercise 5-10 times with each hand. ?Contact a health care provider if: ?Your hand pain or discomfort gets much worse when you do an exercise. ?Your hand pain or discomfort does not improve within 2 hours after you exercise. ?If you have any of these problems, stop doing these exercises right away. Do not do them again unless your health care provider says that you can. ?Get help right away if: ?You develop sudden, severe hand pain or swelling. If this happens, stop doing these exercises right away. Do not do them again unless your health care provider says that you can. ?This information is not intended to replace advice given to you by your health care provider. Make sure you discuss any questions you have with your health care provider. ?Document Revised: 09/20/2020 Document Reviewed: 09/20/2020 ?Elsevier Patient Education ? 2023 [employer] ?Knee Exercises ?Ask your health care provider which exercises are safe for you. Do exercises exactly as told by your health care provider and adjust them as directed. It is normal to feel mild stretching, pulling, tightness, or  discomfort as you do these exercises. Stop right away if you feel sudden pain or your pain gets worse. Do not begin these exercises until told by your health care provider. ?Stretching and range-of-motion exercises ?These exercises warm up your muscles and joints and improve the movement and flexibility of your knee. These exercises also help to relieve pain and swelling. ?Knee extension, prone ? ?Lie on your abdomen (prone position) on a bed. ?Place your left / right knee just beyond the edge of the surface so your knee is not on the bed. You can put a towel under your left / right thigh just above your kneecap for comfort. ?Relax your leg muscles and allow gravity to straighten your knee (extension). You should feel a stretch behind your left / right knee. ?Hold this position for __________ seconds. ?Scoot up so your knee is supported between repetitions. ?Repeat __________ times. Complete this exercise __________ times a day. ?Knee flexion, active ? ?Lie on your back with both legs straight. If this causes back discomfort, bend your left / right knee so your foot is flat on the floor. ?Slowly slide your left / right heel back toward your buttocks. Stop when   you feel a gentle stretch in the front of your knee or thigh (flexion). ?Hold this position for __________ seconds. ?Slowly slide your left / right heel back to the starting position. ?Repeat __________ times. Complete this exercise __________ times a day. ?Quadriceps stretch, prone ? ?Lie on your abdomen on a firm surface, such as a bed or padded floor. ?Bend your left / right knee and hold your ankle. If you cannot reach your ankle or pant leg, loop a belt around your foot and grab the belt instead. ?Gently pull your heel toward your buttocks. Your knee should not slide out to the side. You should feel a stretch in the front of your thigh and knee (quadriceps). ?Hold this position for __________ seconds. ?Repeat __________ times. Complete this exercise  __________ times a day. ?Hamstring, supine ? ?Lie on your back (supine position). ?Loop a belt or towel over the ball of your left / right foot. The ball of your foot is on the walking surface, right under you

## 2021-10-15 LAB — URINALYSIS, ROUTINE W REFLEX MICROSCOPIC
Bilirubin Urine: NEGATIVE
Glucose, UA: NEGATIVE
Hgb urine dipstick: NEGATIVE
Ketones, ur: NEGATIVE
Leukocytes,Ua: NEGATIVE
Nitrite: NEGATIVE
Protein, ur: NEGATIVE
Specific Gravity, Urine: 1.027 (ref 1.001–1.035)
pH: 6 (ref 5.0–8.0)

## 2021-10-15 LAB — RNP ANTIBODY: Ribonucleic Protein(ENA) Antibody, IgG: <1 AI

## 2021-10-15 LAB — ANTI-SMITH ANTIBODY: ENA SM Ab Ser-aCnc: <1 AI

## 2021-10-15 LAB — ANTI-DNA ANTIBODY, DOUBLE-STRANDED: ds DNA Ab: <1 IU/mL

## 2021-10-15 LAB — C3 AND C4
C3 Complement: 170 mg/dL (ref 83–193)
C4 Complement: 61 mg/dL — ABNORMAL HIGH (ref 15–57)

## 2021-10-15 LAB — SJOGRENS SYNDROME-B EXTRACTABLE NUCLEAR ANTIBODY: SSB (La) (ENA) Antibody, IgG: <1 AI

## 2021-10-15 LAB — SJOGRENS SYNDROME-A EXTRACTABLE NUCLEAR ANTIBODY: SSA (Ro) (ENA) Antibody, IgG: 3.2 AI — AB

## 2021-10-15 LAB — RHEUMATOID FACTOR: Rheumatoid fact SerPl-aCnc: <14 IU/mL (ref <14)

## 2021-10-15 LAB — CYCLIC CITRUL PEPTIDE ANTIBODY, IGG: Cyclic Citrullin Peptide Ab: <16 UNITS

## 2021-10-16 ENCOUNTER — Encounter: Payer: Self-pay | Admitting: Rheumatology

## 2021-10-16 NOTE — Progress Notes (Signed)
Positive Ro antibody noted and positive ANA noted which could be seen with Sjogren's.  We will discuss results at the follow-up visit.  Rest of the labs are within normal limits.

## 2021-10-16 NOTE — Telephone Encounter (Signed)
Patient called our office to request an appointment. ?Patient was frustrated regarding results answer below and wanted to transfer care. ? ?I advised patient to follow-up with her rheumatologist as she has been advised as I felt like that is really where her frustration resides. ? ?If patient decides she still wants to transfer back, is this okay? She hasn't made a final decision and at the end of the call patient said she would reach out to her Rheumatologist ? ? ? ? ?

## 2021-10-16 NOTE — Telephone Encounter (Signed)
Her labs are positive for Sjogren's.  Which is not going to explain her current symptoms.  She has fibromyalgia and needs physical therapy for fibromyalgia.  She may consider seeing a dermatologist for itching.  We will discuss results at the follow-up visit.  If there is an earlier opening then we can see her sooner.

## 2021-10-16 NOTE — Telephone Encounter (Signed)
We can refer patient to the pain management.

## 2021-10-17 NOTE — Telephone Encounter (Signed)
Which office are you referring to? Is she wanting to transfer her primary care or see a different rheumatologist? ?

## 2021-10-18 ENCOUNTER — Ambulatory Visit: Payer: 59 | Admitting: Internal Medicine

## 2021-10-21 NOTE — Progress Notes (Signed)
Office Visit Note  Patient: [name]             Date of Birth: [birth year]           MRN: [id number]             PCP: Lorre Munroe, NP Referring: Lorre Munroe, NP Visit Date: 10/31/2021 Occupation: @GUAROCC @  Subjective:  Pain in multiple joints  History of Present Illness: [name] is a 56 y.o. female with Sjogren's and osteoarthritis.  She continues to have dry mouth and dry eyes.  She complains of pain and discomfort in her bilateral hands, bilateral knee joints, bilateral feet, neck and lower back.  She states she has been also having discomfort in her hip joints which she describes over the trochanteric bursa.  She states the pain radiates down into her legs from her hips.  She denies any history of eye inflammation, shortness of breath.  She has not noticed any joint swelling.  Activities of Daily Living:  Patient reports morning stiffness for 20 minutes.   Patient Reports nocturnal pain.  Difficulty dressing/grooming: Reports Difficulty climbing stairs: Reports Difficulty getting out of chair: Denies Difficulty using hands for taps, buttons, cutlery, and/or writing: Reports  Review of Systems  Constitutional:  Positive for fatigue.  HENT:  Positive for mouth dryness and nose dryness. Negative for mouth sores.   Eyes:  Positive for dryness. Negative for pain and itching.  Respiratory:  Negative for shortness of breath and difficulty breathing.   Cardiovascular:  Negative for chest pain and palpitations.  Gastrointestinal:  Negative for blood in stool, constipation and diarrhea.  Endocrine: Negative for increased urination.  Genitourinary:  Negative for difficulty urinating.  Musculoskeletal:  Positive for joint pain, joint pain, myalgias, morning stiffness, muscle tenderness and myalgias. Negative for joint swelling.  Skin:  Negative for color change, rash and redness.  Allergic/Immunologic: Negative for susceptible to infections.   Neurological:  Positive for headaches and weakness. Negative for dizziness, numbness and memory loss.  Hematological:  Positive for bruising/bleeding tendency.  Psychiatric/Behavioral:  Negative for confusion.    PMFS History:  Patient Active Problem List   Diagnosis Date Noted   Primary osteoarthritis of both hands 10/31/2021   Fibromyalgia 10/24/2021   Sjogren's syndrome (HCC) 10/24/2021   DDD (degenerative disc disease), lumbar 10/14/2021   Caregiver stress 02/14/2021   Insomnia 02/14/2021   Prediabetes 12/31/2020   HLD (hyperlipidemia) 12/31/2020   Frequent headaches 12/31/2020   Morbid obesity (HCC) 12/31/2020   Gastroesophageal reflux disease without esophagitis 11/15/2018   Arthralgia of multiple joints 06/02/2016   Anxiety and depression 08/03/2014    Past Medical History:  Diagnosis Date   Allergy    DDD (degenerative disc disease), lumbar    Depression     Family History  Problem Relation Age of Onset   Hyperlipidemia Mother    Rheum arthritis Sister    Osteoarthritis Sister    Fibroids Sister    Fibroids Sister    Fibroids Sister    Heart disease Maternal Aunt    Stroke Maternal Aunt    Diabetes Maternal Aunt    Heart disease Maternal Uncle    Stroke Maternal Uncle    Rheum arthritis Nephew    Cancer Neg Hx    Past Surgical History:  Procedure Laterality Date   ABDOMINAL HYSTERECTOMY  2008   LAVH, endometriosis ablation, mccall culdoplasty   CHOLECYSTECTOMY N/A 08/31/2019   Procedure: LAPAROSCOPIC CHOLECYSTECTOMY;  Surgeon: 09/02/2019, MD;  Location: [facility] ORS;  Service: General;  Laterality: N/A;   DIAGNOSTIC LAPAROSCOPY  2002   Hysteroscopy, diagnostic laparoscopy, extensive lysis of adhesions, endometrial ablation, RSO   RIGHT OOPHORECTOMY  2007   with right salpingectomy   TUBAL LIGATION     Social History   Social History Narrative   Not on file   Immunization History  Administered Date(s) Administered   Influenza,inj,Quad PF,6+ Mos  03/17/2015, 03/17/2017, 02/25/2019, 03/19/2020, 03/28/2021   PFIZER(Purple Top)SARS-COV-2 Vaccination 07/11/2019, 08/01/2019   Tdap 02/09/2007, 09/18/2016     Objective: Vital Signs: BP 128/88 (BP Location: Left Arm, Patient Position: Sitting, Cuff Size: Large)   Pulse 84   Ht $R'5\' 5"'Oo$  (1.651 m)   Wt 273 lb (123.8 kg)   BMI 45.43 kg/m    Physical Exam Vitals and nursing note reviewed.  Constitutional:      Appearance: She is well-developed.  HENT:     Head: Normocephalic and atraumatic.  Eyes:     Conjunctiva/sclera: Conjunctivae normal.  Cardiovascular:     Rate and Rhythm: Normal rate and regular rhythm.     Heart sounds: Normal heart sounds.  Pulmonary:     Effort: Pulmonary effort is normal.     Breath sounds: Normal breath sounds.  Abdominal:     General: Bowel sounds are normal.     Palpations: Abdomen is soft.  Musculoskeletal:     Cervical back: Normal range of motion.  Lymphadenopathy:     Cervical: No cervical adenopathy.  Skin:    General: Skin is warm and dry.     Capillary Refill: Capillary refill takes less than 2 seconds.  Neurological:     Mental Status: She is alert and oriented to person, place, and time.  Psychiatric:        Behavior: Behavior normal.     Musculoskeletal Exam: C-spine was in good range of motion.  Shoulder joints, elbow joints, wrist joints with good range of motion.  She had bilateral PIP and DIP thickening but no synovitis.  Hip joints with good range of motion.  She had tenderness over bilateral trochanteric bursa.  Knee joints were in good range of motion with some discomfort.  No warmth swelling or effusion was noted.  There was no tenderness over ankles or MTPs.  CDAI Exam: CDAI Score: -- Patient Global: --; Provider Global: -- Swollen: --; Tender: -- Joint Exam 10/31/2021   No joint exam has been documented for this visit   There is currently no information documented on the homunculus. Go to the Rheumatology activity and  complete the homunculus joint exam.  Investigation: No additional findings.  Imaging: XR Cervical Spine 2 or 3 views  Result Date: 10/14/2021 No significant disc space narrowing was noted.  Anterior osteophytes were noted. Impression: Mild degenerative changes were noted.  XR Foot 2 Views Left  Result Date: 10/14/2021 No MTP, PIP or DIP narrowing was noted.  No intertarsal, tibiotalar or subtalar joint space narrowing was noted.  Dorsal spurring and inferior calcaneal spur was noted. Impression: Unremarkable x-rays of the foot except for calcaneal spur.  XR Foot 2 Views Right  Result Date: 10/14/2021 No MTP, PIP or DIP narrowing was noted.  No intertarsal, tibiotalar or subtalar joint space narrowing was noted.  Dorsal spurring and inferior calcaneal spur was noted. Impression: Unremarkable x-rays of the foot except for calcaneal spur.  XR Hand 2 View Left  Result Date: 10/14/2021 Mild CMC and PIP narrowing was noted.  No DIP, intercarpal or radiocarpal joint space  narrowing was noted.  No erosive changes were noted. Impression: These findings are consistent with early osteoarthritis of the hand.  XR Hand 2 View Right  Result Date: 10/14/2021 Mild CMC and PIP narrowing was noted.  No DIP, intercarpal or radiocarpal joint space narrowing was noted.  No erosive changes were noted. Impression: These findings are consistent with early osteoarthritis of the hand.  XR KNEE 3 VIEW LEFT  Result Date: 10/14/2021 Mild medial compartment narrowing and mild patellofemoral narrowing was noted.  No chondrocalcinosis was noted. Impression: These findings were consistent with mild osteoarthritis and mild chondromalacia patella.  XR KNEE 3 VIEW RIGHT  Result Date: 10/14/2021 Mild medial compartment narrowing and mild patellofemoral narrowing was noted.  No chondrocalcinosis was noted. Impression: These findings were consistent with mild osteoarthritis and mild chondromalacia patella.   Recent Labs: Lab  Results  Component Value Date   WBC 6.1 07/31/2021   HGB 12.6 07/31/2021   PLT 216 07/31/2021   NA 143 07/31/2021   K 3.9 07/31/2021   CL 110 07/31/2021   CO2 27 07/31/2021   GLUCOSE 81 07/31/2021   BUN 15 07/31/2021   CREATININE 0.74 07/31/2021   BILITOT 0.4 07/31/2021   ALKPHOS 62 03/19/2020   AST 23 07/31/2021   ALT 28 07/31/2021   PROT 7.4 07/31/2021   ALBUMIN 3.9 03/19/2020   CALCIUM 9.5 07/31/2021   GFRAA >60 12/01/2019   Oct 14, 2021 UA negative, C3 normal, C4 normal, SSA antibody 3.2, SSB negative, dsDNA negative, Smith negative, RNP negative, RF negative, anti-CCP negative  07/31/21: ANA 1:80NS, 1:40 cytoplasmic, CRP 6.3, ESR 22, CK 65  Speciality Comments: No specialty comments available.  Procedures:  No procedures performed Allergies: Gabapentin, Hydrochlorothiazide, Penicillins, and Orange juice [orange oil]   Assessment / Plan:     Visit Diagnoses: Sjogren's syndrome with keratoconjunctivitis sicca (HCC) - Positive ANA, positive SSA, sicca symptoms, arthralgias.  Detailed counsel regarding Sjogren's disease was provided.  She has sicca symptoms with dry mouth and dry eyes.  Over-the-counter products for dry eyes and dry mouth were discussed.  I also discussed the option of using pilocarpine which she declined.  Association of Sjogren's with arrhythmias was discussed.  Patient states she had EKG which was normal.  She denies any shortness of breath.  Association of Sjogren's syndrome with ILD and increased risk of lymphoma was also discussed.  Primary osteoarthritis of both hands - She complains of pain and discomfort in her bilateral hands.  No synovitis was noted.  X-ray findings were consistent with early osteoarthritic changes. h/o right wrist joint inject in the past.  She had no synovitis on examination.  Paresthesia of both hands-she still has intermittent paresthesias.  She is going through physical therapy and wants to hold off on the nerve conduction  velocity.  Trochanteric bursitis of both hips-she had tenderness over bilateral trochanteric bursa which has been radiating to her IT bands.  I gave her a handout on IT band stretches.  Chronic pain of both knees - She had cortisone injection in her right knee by an orthopedic surgeon in the past.  X-rays showed mild osteoarthritic changes and mild chondromalacia patella.  X-ray findings were discussed with the patient.  Weight loss diet and exercise was discussed.  A handout on lower extremity exercises was given.  Pain in both feet - Synovitis was noted.  Bilateral calcaneal spurs were noted.  X-ray findings were discussed with the patient.  Neck pain - History of pain and stiffness in the  cervical spine.  Mild degenerative changes were noted on the x-rays.  X-ray findings were discussed with the patient.  A handout on neck exercises was given.  DDD (degenerative disc disease), lumbar - She goes to pain management and gets injections.  A handout on lumbar spine exercises was given.  Fibromyalgia - History of hyperalgesia, generalized pain and tender points.  She was advised to use of Cymbalta and gabapentin.  She was referred to physical therapy.  She started going to physical therapy.  I also discussed the benefit from water aerobics and swimming.  Weight loss diet and exercise was discussed.  The medical problems are listed as follows:  Hypermobility of joint  Prediabetes  Pure hypertriglyceridemia  Anxiety and depression  Adjustment insomnia  Gastroesophageal reflux disease without esophagitis  Frequent headaches  Morbid obesity (HCC)  Seasonal allergies  Orders: No orders of the defined types were placed in this encounter.  No orders of the defined types were placed in this encounter.    Follow-Up Instructions: Return in about 3 months (around 01/31/2022) for Sjogren's, Osteoarthritis.   Bo Merino, MD  Note - This record has been created using Radio producer.  Chart creation errors have been sought, but may not always  have been located. Such creation errors do not reflect on  the standard of medical care.

## 2021-10-24 ENCOUNTER — Ambulatory Visit: Payer: 59 | Admitting: Internal Medicine

## 2021-10-24 ENCOUNTER — Encounter: Payer: Self-pay | Admitting: Internal Medicine

## 2021-10-24 DIAGNOSIS — M797 Fibromyalgia: Secondary | ICD-10-CM | POA: Diagnosis not present

## 2021-10-24 DIAGNOSIS — M3500 Sicca syndrome, unspecified: Secondary | ICD-10-CM | POA: Diagnosis not present

## 2021-10-24 MED ORDER — DULOXETINE HCL 20 MG PO CPEP: 20.0000 mg | ORAL_CAPSULE | Freq: Every day | ORAL | 1 refills | Status: DC

## 2021-10-24 NOTE — Assessment & Plan Note (Signed)
Discussed the main goals of treatment would be stretching, muscle toning and weight loss ?Continue Celebrex, Skelaxin and tramadol as previously prescribed ?We will add Duloxetine 20 mg daily ?She is considering pain management but leaning less towards this route ?

## 2021-10-24 NOTE — Assessment & Plan Note (Signed)
She will follow-up with rheumatology regarding this ?

## 2021-10-24 NOTE — Patient Instructions (Signed)
Myofascial Pain Syndrome and Fibromyalgia ?Myofascial pain syndrome and fibromyalgia are both pain disorders. You may feel this pain mainly in your muscles. ?Myofascial pain syndrome: ?Always has tender points in the muscles that will cause pain when pressed (trigger points). The pain may come and go. ?Usually affects your neck, upper back, and shoulder areas. The pain often moves into your arms and hands. ?Fibromyalgia: ?Has muscle pains and tenderness that come and go. ?Is often associated with tiredness (fatigue) and sleep problems. ?Has trigger points. ?Tends to be long-lasting (chronic), but is not life-threatening. ?Fibromyalgia and myofascial pain syndrome are not the same. However, they often occur together. If you have both conditions, each can make the other worse. Both are common and can cause enough pain and fatigue to make day-to-day activities difficult. Both can be hard to diagnose because their symptoms are common in many other conditions. ?What are the causes? ?The exact causes of these conditions are not known. ?What increases the risk? ?You are more likely to develop either of these conditions if: ?You have a family history of the condition. ?You are female. ?You have certain triggers, such as: ?Spine disorders. ?An injury (trauma) or other physical stressors. ?Being under a lot of stress. ?Medical conditions such as osteoarthritis, rheumatoid arthritis, or lupus. ?What are the signs or symptoms? ?Fibromyalgia ?The main symptom of fibromyalgia is widespread pain and tenderness in your muscles. Pain is sometimes described as stabbing, shooting, or burning. ?You may also have: ?Tingling or numbness. ?Sleep problems and fatigue. ?Problems with attention and concentration (fibro fog). ?Other symptoms may include: ?Bowel and bladder problems. ?Headaches. ?Vision problems. ?Sensitivity to odors and noises. ?Depression or mood changes. ?Painful menstrual periods (dysmenorrhea). ?Dry skin or eyes. ?These  symptoms can vary over time. ?Myofascial pain syndrome ?Symptoms of myofascial pain syndrome include: ?Tight, ropy bands of muscle. ?Uncomfortable sensations in muscle areas. These may include aching, cramping, burning, numbness, tingling, and weakness. ?Difficulty moving certain parts of the body freely (poor range of motion). ?How is this diagnosed? ?This condition may be diagnosed by your symptoms and medical history. You will also have a physical exam. In general: ?Fibromyalgia is diagnosed if you have pain, fatigue, and other symptoms for more than 3 months, and symptoms cannot be explained by another condition. ?Myofascial pain syndrome is diagnosed if you have trigger points in your muscles, and those trigger points are tender and cause pain elsewhere in your body (referred pain). ?How is this treated? ?Treatment for these conditions depends on the type that you have. ?For fibromyalgia a healthy lifestyle is the most important treatment including aerobic and strength exercises. Different types of medicines are used to help treat pain and include: ?NSAIDs. ?Medicines for treating depression. ?Medicines that help control seizures. ?Medicines that relax the muscles. ?Treatment for myofascial pain syndrome includes: ?Pain medicines, such as NSAIDs. ?Cooling and stretching of muscles. ?Massage therapy with myofascial release technique. ?Trigger point injections. ?Treating these conditions often requires a team of health care providers. These may include: ?Your primary care provider. ?A physical therapist. ?Complementary health care providers, such as massage therapists or acupuncturists. ?A psychiatrist for cognitive behavioral therapy. ?Follow these instructions at home: ?Medicines ?Take over-the-counter and prescription medicines only as told by your health care provider. ?Ask your health care provider if the medicine prescribed to you: ?Requires you to avoid driving or using machinery. ?Can cause constipation.  You may need to take these actions to prevent or treat constipation: ?Drink enough fluid to keep your urine pale   yellow. ?Take over-the-counter or prescription medicines. ?Eat foods that are high in fiber, such as beans, whole grains, and fresh fruits and vegetables. ?Limit foods that are high in fat and processed sugars, such as fried or sweet foods. ?Lifestyle ? ?Do exercises as told by your health care provider or physical therapist. ?Practice relaxation techniques to control your stress. You may want to try: ?Biofeedback. ?Visual imagery. ?Hypnosis. ?Muscle relaxation. ?Yoga. ?Meditation. ?Maintain a healthy lifestyle. This includes eating a healthy diet and getting enough sleep. ?Do not use any products that contain nicotine or tobacco. These products include cigarettes, chewing tobacco, and vaping devices, such as e-cigarettes. If you need help quitting, ask your health care provider. ?General instructions ?Talk to your health care provider about complementary treatments, such as acupuncture or massage. ?Do not do activities that stress or strain your muscles. This includes repetitive motions and heavy lifting. ?Keep all follow-up visits. This is important. ?Where to find support ?Consider joining a support group with others who are diagnosed with this condition. ?National Fibromyalgia Association: www.fmaware.org ?Where to find more information ?American Chronic Pain Association: www.theacpa.org ?Contact a health care provider if: ?You have new symptoms. ?Your symptoms get worse or your pain is severe. ?You have side effects from your medicines. ?You have trouble sleeping. ?Your condition is causing depression or anxiety. ?Get help right away if: ?You have thoughts of hurting yourself or others. ?Get help right awayif you feel like you may hurt yourself or others, or have thoughts about taking your own life. Go to your nearest emergency room or: ?Call 911. ?Call the National Suicide Prevention Lifeline at  1-800-273-8255 or 988. This is open 24 hours a day. ?Text the Crisis Text Line at 741741. ?Summary ?Myofascial pain syndrome and fibromyalgia are pain disorders. ?Myofascial pain syndrome has tender points in the muscles that will cause pain when pressed (trigger points). Fibromyalgia also has muscle pains and tenderness that come and go, but this condition is often associated with fatigue and sleep disturbances. ?Fibromyalgia and myofascial pain syndrome are not the same but often occur together, causing pain and fatigue that make day-to-day activities difficult. ?Follow your health care provider's instructions for taking medicines and maintaining a healthy lifestyle. ?This information is not intended to replace advice given to you by your health care provider. Make sure you discuss any questions you have with your health care provider. ?Document Revised: 05/03/2021 Document Reviewed: 05/03/2021 ?Elsevier Patient Education ? 2023 [employer] ? ?

## 2021-10-24 NOTE — Progress Notes (Signed)
? ?Subjective:  ? ? Patient ID: [name], female    DOB: [birth year], 56 y.o.   MRN: [id number] ? ?HPI ? ?Patient presents to clinic today requesting to follow-up on her recent rheumatology visit.  She was evaluated for chronic fatigue, muscle and joint pain.  Her ANA was positive.  She had an elevated C4 and positive Sjogren's antibody.  She was diagnosed with fibromyalgia and Sjogren's although they did not feel like the Sjogren's was what was causing her symptoms.  The rheumatologist recommended she be evaluated by her PCP for management of fibromyalgia as she does not typically treat this.  She is currently taking Celebrex and Skelaxin. She takes Tramadol prn. She has taken Gabapentin in the past, but it made her hair fall out. She did offer her a referral to pain management.  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 ? ?Review of Systems ? ? ?Past Medical History:  ?Diagnosis Date  ? Allergy   ? DDD (degenerative disc disease), lumbar   ? Depression   ? ? ?Current Outpatient Medications  ?Medication Sig Dispense Refill  ? ALLEGRA-D ALLERGY & CONGESTION 180-240 MG 24 hr tablet TAKE 1 TABLET BY MOUTH EVERY DAY AS NEEDED 30 tablet 1  ? atorvastatin (LIPITOR) 10 MG tablet Take 1 tablet (10 mg total) by mouth daily. (Patient not taking: Reported on 10/14/2021) 90 tablet 0  ? celecoxib (CELEBREX) 100 MG capsule Take 1 capsule (100 mg total) by mouth 2 (two) times daily. 60 capsule 2  ? diclofenac Sodium (VOLTAREN) 1 % GEL APPLY 2 GRAMS TO AFFECTED AREA 4 TIMES A DAY 100 g 0  ? famotidine (PEPCID) 20 MG tablet Take 1 tablet (20 mg total) by mouth 2 (two) times daily as needed for heartburn or indigestion. 180 tablet 1  ? hydrOXYzine (VISTARIL) 25 MG capsule TAKE 1 CAPSULE (25 MG TOTAL) BY MOUTH DAILY AS NEEDED. 90 capsule 1  ? metaxalone (SKELAXIN) 800 MG tablet Take 1 tablet (800 mg total) by mouth daily as needed for muscle spasms. 30 tablet 1  ? Olopatadine HCl 0.2 % SOLN Apply 1 drop  to eye in the morning and at bedtime. 2.5 mL 0  ? pantoprazole (PROTONIX) 40 MG tablet TAKE 1 TABLET BY MOUTH TWICE A DAY 60 tablet 5  ? traMADol (ULTRAM) 50 MG tablet Take 1 tablet (50 mg total) by mouth daily as needed. 30 tablet 0  ? ?No current facility-administered medications for this visit.  ? ? ?Allergies  ?Allergen Reactions  ? Gabapentin Other (See Comments)  ?  Hair loss  ? Hydrochlorothiazide Other (See Comments)  ?  The pt state she couldn't function and felt like she was dying   ? Penicillins Other (See Comments)  ?  Childhood allergy ?Has patient had a PCN reaction causing immediate rash, facial/tongue/throat swelling, SOB or lightheadedness with hypotension: Unknown ?Has patient had a PCN reaction causing severe rash involving mucus membranes or skin necrosis: Unknown ?Has patient had a PCN reaction that required hospitalization: No  ?Has patient had a PCN reaction occurring within the last 10 years: No  ?If all of the above answers are "NO", then may proceed with Cephalosporin use. ?  ? Orange Juice [Orange Oil] Rash  ?  rash  ? ? ?Family History  ?Problem Relation Age of Onset  ? Hyperlipidemia Mother   ? Rheum arthritis Sister   ? Osteoarthritis Sister   ? Fibroids Sister   ? Fibroids Sister   ?  Fibroids Sister   ? Heart disease Maternal Aunt   ? Stroke Maternal Aunt   ? Diabetes Maternal Aunt   ? Heart disease Maternal Uncle   ? Stroke Maternal Uncle   ? Rheum arthritis Nephew   ? Cancer Neg Hx   ? ? ?Social History  ? ?Socioeconomic History  ? Marital status: Married  ?  Spouse name: Not on file  ? Number of children: Not on file  ? Years of education: Not on file  ? Highest education level: Not on file  ?Occupational History  ? Not on file  ?Tobacco Use  ? Smoking status: Never  ?  Passive exposure: Current  ? Smokeless tobacco: Never  ?Vaping Use  ? Vaping Use: Never used  ?Substance and Sexual Activity  ? Alcohol use: Yes  ?  Comment: social  ? Drug use: Yes  ?  Types: Marijuana  ? Sexual  activity: Yes  ?  Partners: Male  ?  Birth control/protection: Post-menopausal, Surgical  ?Other Topics Concern  ? Not on file  ?Social History Narrative  ? Not on file  ? ?Social Determinants of Health  ? ?Financial Resource Strain: Not on file  ?Food Insecurity: Not on file  ?Transportation Needs: Not on file  ?Physical Activity: Not on file  ?Stress: Not on file  ?Social Connections: Not on file  ?Intimate Partner Violence: Not on file  ? ? ? ?Constitutional: Patient reports chronic fatigue.  Denies fever, malaise, headache or abrupt weight changes.  ?Respiratory: Denies difficulty breathing, shortness of breath, cough or sputum production.   ?Cardiovascular: Denies chest pain, chest tightness, palpitations or swelling in the hands or feet.  ?Musculoskeletal: Patient reports chronic muscle and joint pain.  Denies decrease in range of motion, difficulty with gait, or joint swelling.  ?Neurological: Patient reports paresthesia of upper and lower extremities.  Denies problems with balance and coordination.  ? ? ?No other specific complaints in a complete review of systems (except as listed in HPI above). ? ?   ?Objective:  ? Physical Exam ? ?BP 124/80 (BP Location: Left Arm, Patient Position: Sitting, Cuff Size: Large)   Pulse 74   Temp (!) 97.5 ?F (36.4 ?C) (Temporal)   Wt 271 lb (122.9 kg)   SpO2 98%   BMI 45.10 kg/m?  ? ?Wt Readings from Last 3 Encounters:  ?10/14/21 272 lb 12.8 oz (123.7 kg)  ?03/28/21 264 lb 9.6 oz (120 kg)  ?12/28/20 257 lb 3.2 oz (116.7 kg)  ? ? ?General: Appears her stated age, obese, in NAD. ?Cardiovascular: Normal rate. ?Pulmonary/Chest: Normal effort. ?Musculoskeletal: Gait slow and steady without device. ?Neurological: Alert and oriented. ? ?BMET ?   ?Component Value Date/Time  ? NA 143 07/31/2021 1451  ? NA 142 09/18/2016 0929  ? K 3.9 07/31/2021 1451  ? CL 110 07/31/2021 1451  ? CO2 27 07/31/2021 1451  ? GLUCOSE 81 07/31/2021 1451  ? BUN 15 07/31/2021 1451  ? BUN 9 09/18/2016 0929   ? CREATININE 0.74 07/31/2021 1451  ? CALCIUM 9.5 07/31/2021 1451  ? GFRNONAA >60 12/01/2019 1558  ? GFRAA >60 12/01/2019 1558  ? ? ?Lipid Panel  ?   ?Component Value Date/Time  ? CHOL 236 (H) 03/28/2021 8127  ? CHOL 200 (H) 03/23/2017 0844  ? TRIG 88 03/28/2021 0838  ? HDL 73 03/28/2021 0838  ? HDL 57 03/23/2017 0844  ? CHOLHDL 3.2 03/28/2021 0838  ? VLDL 36.2 03/19/2020 0918  ? [place] 144 (H) 03/28/2021 5170  ? ? ?  CBC ?   ?Component Value Date/Time  ? WBC 6.1 07/31/2021 1451  ? RBC 3.97 07/31/2021 1451  ? HGB 12.6 07/31/2021 1451  ? HGB 13.2 09/18/2016 0929  ? HCT 37.1 07/31/2021 1451  ? HCT 38.1 09/18/2016 0929  ? PLT 216 07/31/2021 1451  ? PLT 268 09/18/2016 0929  ? MCV 93.5 07/31/2021 1451  ? MCV 94 09/18/2016 0929  ? MCH 31.7 07/31/2021 1451  ? MCHC 34.0 07/31/2021 1451  ? RDW 12.8 07/31/2021 1451  ? RDW 12.8 09/18/2016 0929  ? LYMPHSABS 2.8 08/30/2019 0315  ? LYMPHSABS 2.8 08/03/2014 1609  ? MONOABS 0.6 08/30/2019 0315  ? EOSABS 0.1 08/30/2019 0315  ? EOSABS 0.1 08/03/2014 1609  ? BASOSABS 0.0 08/30/2019 0315  ? BASOSABS 0.0 08/03/2014 1609  ? ? ?Hgb A1C ?Lab Results  ?Component Value Date  ? HGBA1C 5.8 (H) 03/28/2021  ? ? ? ? ? ? ?   ?Assessment & Plan:  ? ? ? ?Webb Silversmith, NP ? ?

## 2021-10-24 NOTE — Assessment & Plan Note (Signed)
Encourage diet and exercise for weight loss 

## 2021-10-31 ENCOUNTER — Ambulatory Visit: Payer: 59 | Admitting: Rheumatology

## 2021-10-31 ENCOUNTER — Encounter: Payer: Self-pay | Admitting: Rheumatology

## 2021-10-31 VITALS — BP 128/88 | HR 84 | Ht 65.0 in | Wt 273.0 lb

## 2021-10-31 DIAGNOSIS — K219 Gastro-esophageal reflux disease without esophagitis: Secondary | ICD-10-CM

## 2021-10-31 DIAGNOSIS — M7062 Trochanteric bursitis, left hip: Secondary | ICD-10-CM

## 2021-10-31 DIAGNOSIS — R202 Paresthesia of skin: Secondary | ICD-10-CM | POA: Diagnosis not present

## 2021-10-31 DIAGNOSIS — M19041 Primary osteoarthritis, right hand: Secondary | ICD-10-CM | POA: Diagnosis not present

## 2021-10-31 DIAGNOSIS — F5102 Adjustment insomnia: Secondary | ICD-10-CM

## 2021-10-31 DIAGNOSIS — M3501 Sicca syndrome with keratoconjunctivitis: Secondary | ICD-10-CM

## 2021-10-31 DIAGNOSIS — M19042 Primary osteoarthritis, left hand: Secondary | ICD-10-CM | POA: Insufficient documentation

## 2021-10-31 DIAGNOSIS — E781 Pure hyperglyceridemia: Secondary | ICD-10-CM

## 2021-10-31 DIAGNOSIS — R519 Headache, unspecified: Secondary | ICD-10-CM

## 2021-10-31 DIAGNOSIS — M249 Joint derangement, unspecified: Secondary | ICD-10-CM

## 2021-10-31 DIAGNOSIS — M5136 Other intervertebral disc degeneration, lumbar region: Secondary | ICD-10-CM

## 2021-10-31 DIAGNOSIS — F32A Depression, unspecified: Secondary | ICD-10-CM

## 2021-10-31 DIAGNOSIS — G8929 Other chronic pain: Secondary | ICD-10-CM

## 2021-10-31 DIAGNOSIS — M7061 Trochanteric bursitis, right hip: Secondary | ICD-10-CM

## 2021-10-31 DIAGNOSIS — M542 Cervicalgia: Secondary | ICD-10-CM

## 2021-10-31 DIAGNOSIS — F419 Anxiety disorder, unspecified: Secondary | ICD-10-CM

## 2021-10-31 DIAGNOSIS — M797 Fibromyalgia: Secondary | ICD-10-CM

## 2021-10-31 DIAGNOSIS — M79672 Pain in left foot: Secondary | ICD-10-CM

## 2021-10-31 DIAGNOSIS — J302 Other seasonal allergic rhinitis: Secondary | ICD-10-CM

## 2021-10-31 DIAGNOSIS — M25562 Pain in left knee: Secondary | ICD-10-CM

## 2021-10-31 DIAGNOSIS — M25561 Pain in right knee: Secondary | ICD-10-CM

## 2021-10-31 DIAGNOSIS — M79671 Pain in right foot: Secondary | ICD-10-CM

## 2021-10-31 DIAGNOSIS — R7303 Prediabetes: Secondary | ICD-10-CM

## 2021-10-31 NOTE — Patient Instructions (Addendum)
Back Exercises The following exercises strengthen the muscles that help to support the trunk (torso) and back. They also help to keep the lower back flexible. Doing these exercises can help to prevent or lessen existing low back pain. If you have back pain or discomfort, try doing these exercises 2-3 times each day or as told by your health care provider. As your pain improves, do them once each day, but increase the number of times that you repeat the steps for each exercise (do more repetitions). To prevent the recurrence of back pain, continue to do these exercises once each day or as told by your health care provider. Do exercises exactly as told by your health care provider and adjust them as directed. It is normal to feel mild stretching, pulling, tightness, or discomfort as you do these exercises, but you should stop right away if you feel sudden pain or your pain gets worse. Exercises Single knee to chest Repeat these steps 3-5 times for each leg: Lie on your back on a firm bed or the floor with your legs extended. Bring one knee to your chest. Your other leg should stay extended and in contact with the floor. Hold your knee in place by grabbing your knee or thigh with both hands and hold. Pull on your knee until you feel a gentle stretch in your lower back or buttocks. Hold the stretch for 10-30 seconds. Slowly release and straighten your leg.  Pelvic tilt Repeat these steps 5-10 times: Lie on your back on a firm bed or the floor with your legs extended. Bend your knees so they are pointing toward the ceiling and your feet are flat on the floor. Tighten your lower abdominal muscles to press your lower back against the floor. This motion will tilt your pelvis so your tailbone points up toward the ceiling instead of pointing to your feet or the floor. With gentle tension and even breathing, hold this position for 5-10 seconds.  Cat-cow Repeat these steps until your lower back becomes  more flexible: Get into a hands-and-knees position on a firm bed or the floor. Keep your hands under your shoulders, and keep your knees under your hips. You may place padding under your knees for comfort. Let your head hang down toward your chest. Contract your abdominal muscles and point your tailbone toward the floor so your lower back becomes rounded like the back of a cat. Hold this position for 5 seconds. Slowly lift your head, let your abdominal muscles relax, and point your tailbone up toward the ceiling so your back forms a sagging arch like the back of a cow. Hold this position for 5 seconds.  Press-ups Repeat these steps 5-10 times: Lie on your abdomen (face-down) on a firm bed or the floor. Place your palms near your head, about shoulder-width apart. Keeping your back as relaxed as possible and keeping your hips on the floor, slowly straighten your arms to raise the top half of your body and lift your shoulders. Do not use your back muscles to raise your upper torso. You may adjust the placement of your hands to make yourself more comfortable. Hold this position for 5 seconds while you keep your back relaxed. Slowly return to lying flat on the floor.  Bridges Repeat these steps 10 times: Lie on your back on a firm bed or the floor. Bend your knees so they are pointing toward the ceiling and your feet are flat on the floor. Your arms should be flat   at your sides, next to your body. Tighten your buttocks muscles and lift your buttocks off the floor until your waist is at almost the same height as your knees. You should feel the muscles working in your buttocks and the back of your thighs. If you do not feel these muscles, slide your feet 1-2 inches (2.5-5 cm) farther away from your buttocks. Hold this position for 3-5 seconds. Slowly lower your hips to the starting position, and allow your buttocks muscles to relax completely. If this exercise is too easy, try doing it with your arms  crossed over your chest. Abdominal crunches Repeat these steps 5-10 times: Lie on your back on a firm bed or the floor with your legs extended. Bend your knees so they are pointing toward the ceiling and your feet are flat on the floor. Cross your arms over your chest. Tip your chin slightly toward your chest without bending your neck. Tighten your abdominal muscles and slowly raise your torso high enough to lift your shoulder blades a tiny bit off the floor. Avoid raising your torso higher than that because it can put too much stress on your lower back and does not help to strengthen your abdominal muscles. Slowly return to your starting position.  Back lifts Repeat these steps 5-10 times: Lie on your abdomen (face-down) with your arms at your sides, and rest your forehead on the floor. Tighten the muscles in your legs and your buttocks. Slowly lift your chest off the floor while you keep your hips pressed to the floor. Keep the back of your head in line with the curve in your back. Your eyes should be looking at the floor. Hold this position for 3-5 seconds. Slowly return to your starting position.  Contact a health care provider if: Your back pain or discomfort gets much worse when you do an exercise. Your worsening back pain or discomfort does not lessen within 2 hours after you exercise. If you have any of these problems, stop doing these exercises right away. Do not do them again unless your health care provider says that you can. Get help right away if: You develop sudden, severe back pain. If this happens, stop doing the exercises right away. Do not do them again unless your health care provider says that you can. This information is not intended to replace advice given to you by your health care provider. Make sure you discuss any questions you have with your health care provider. Document Revised: 11/27/2020 Document Reviewed: 08/15/2020 Elsevier Patient Education  [place]. Cervical Strain and Sprain Rehab Ask your health care provider which exercises are safe for you. Do exercises exactly as told by your health care provider and adjust them as directed. It is normal to feel mild stretching, pulling, tightness, or discomfort as you do these exercises. Stop right away if you feel sudden pain or your pain gets worse. Do not begin these exercises until told by your health care provider. Stretching and range-of-motion exercises Cervical side bending  Using good posture, sit on a stable chair or stand up. Without moving your shoulders, slowly tilt your left / right ear to your shoulder until you feel a stretch in the opposite side neck muscles. You should be looking straight ahead. Hold for __________ seconds. Repeat with the other side of your neck. Repeat __________ times. Complete this exercise __________ times a day. Cervical rotation  Using good posture, sit on a stable chair or stand up. Slowly turn your  head to the side as if you are looking over your left / right shoulder. Keep your eyes level with the ground. Stop when you feel a stretch along the side and the back of your neck. Hold for __________ seconds. Repeat this by turning to your other side. Repeat __________ times. Complete this exercise __________ times a day. Thoracic extension and pectoral stretch  Roll a towel or a small blanket so it is about 4 inches (10 cm) in diameter. Lie down on your back on a firm surface. Put the towel in the middle of your back across your spine. It should not be under your shoulder blades. Put your hands behind your head and let your elbows fall out to your sides. Hold for __________ seconds. Repeat __________ times. Complete this exercise __________ times a day. Strengthening exercises Isometric upper cervical flexion  Lie on your back with a thin pillow behind your head and a small rolled-up towel under your neck. Gently tuck your chin toward your chest  and nod your head down to look toward your feet. Do not lift your head off the pillow. Hold for __________ seconds. Release the tension slowly. Relax your neck muscles completely before you repeat this exercise. Repeat __________ times. Complete this exercise __________ times a day. Isometric cervical extension  Stand about 6 inches (15 cm) away from a wall, with your back facing the wall. Place a soft object, about 6-8 inches (15-20 cm) in diameter, between the back of your head and the wall. A soft object could be a small pillow, a ball, or a folded towel. Gently tilt your head back and press into the soft object. Keep your jaw and forehead relaxed. Hold for __________ seconds. Release the tension slowly. Relax your neck muscles completely before you repeat this exercise. Repeat __________ times. Complete this exercise __________ times a day. Posture and body mechanics Body mechanics refers to the movements and positions of your body while you do your daily activities. Posture is part of body mechanics. Good posture and healthy body mechanics can help to relieve stress in your body's tissues and joints. Good posture means that your spine is in its natural S-curve position (your spine is neutral), your shoulders are pulled back slightly, and your head is not tipped forward. The following are general guidelines for applying improved posture and body mechanics to your everyday activities. Sitting  When sitting, keep your spine neutral and keep your feet flat on the floor. Use a footrest, if necessary, and keep your thighs parallel to the floor. Avoid rounding your shoulders, and avoid tilting your head forward. When working at a desk or a computer, keep your desk at a height where your hands are slightly lower than your elbows. Slide your chair under your desk so you are close enough to maintain good posture. When working at a computer, place your monitor at a height where you are looking straight  ahead and you do not have to tilt your head forward or downward to look at the screen. Standing  When standing, keep your spine neutral and keep your feet about hip-width apart. Keep a slight bend in your knees. Your ears, shoulders, and hips should line up. When you do a task in which you stand in one place for a long time, place one foot up on a stable object that is 2-4 inches (5-10 cm) high, such as a footstool. This helps keep your spine neutral. Resting When lying down and resting, avoid positions that  are most painful for you. Try to support your neck in a neutral position. You can use a contour pillow or a small rolled-up towel. Your pillow should support your neck but not push on it. This information is not intended to replace advice given to you by your health care provider. Make sure you discuss any questions you have with your health care provider. Document Revised: 04/22/2021 Document Reviewed: 04/22/2021 Elsevier Patient Education  [place]. Hand Exercises Hand exercises can be helpful for almost anyone. These exercises can strengthen the hands, improve flexibility and movement, and increase blood flow to the hands. These results can make work and daily tasks easier. Hand exercises can be especially helpful for people who have joint pain from arthritis or have nerve damage from overuse (carpal tunnel syndrome). These exercises can also help people who have injured a hand. Exercises Most of these hand exercises are gentle stretching and motion exercises. It is usually safe to do them often throughout the day. Warming up your hands before exercise may help to reduce stiffness. You can do this with gentle massage or by placing your hands in warm water for 10-15 minutes. It is normal to feel some stretching, pulling, tightness, or mild discomfort as you begin new exercises. This will gradually improve. Stop an exercise right away if you feel sudden, severe pain or your pain gets  worse. Ask your health care provider which exercises are best for you. Knuckle bend or "claw" fist  Stand or sit with your arm, hand, and all five fingers pointed straight up. Make sure to keep your wrist straight during the exercise. Gently bend your fingers down toward your palm until the tips of your fingers are touching the top of your palm. Keep your big knuckle straight and just bend the small knuckles in your fingers. Hold this position for __________ seconds. Straighten (extend) your fingers back to the starting position. Repeat this exercise 5-10 times with each hand. Full finger fist  Stand or sit with your arm, hand, and all five fingers pointed straight up. Make sure to keep your wrist straight during the exercise. Gently bend your fingers into your palm until the tips of your fingers are touching the middle of your palm. Hold this position for __________ seconds. Extend your fingers back to the starting position, stretching every joint fully. Repeat this exercise 5-10 times with each hand. Straight fist Stand or sit with your arm, hand, and all five fingers pointed straight up. Make sure to keep your wrist straight during the exercise. Gently bend your fingers at the big knuckle, where your fingers meet your hand, and the middle knuckle. Keep the knuckle at the tips of your fingers straight and try to touch the bottom of your palm. Hold this position for __________ seconds. Extend your fingers back to the starting position, stretching every joint fully. Repeat this exercise 5-10 times with each hand. Tabletop  Stand or sit with your arm, hand, and all five fingers pointed straight up. Make sure to keep your wrist straight during the exercise. Gently bend your fingers at the big knuckle, where your fingers meet your hand, as far down as you can while keeping the small knuckles in your fingers straight. Think of forming a tabletop with your fingers. Hold this position for  __________ seconds. Extend your fingers back to the starting position, stretching every joint fully. Repeat this exercise 5-10 times with each hand. Finger spread  Place your hand flat on a table  with your palm facing down. Make sure your wrist stays straight as you do this exercise. Spread your fingers and thumb apart from each other as far as you can until you feel a gentle stretch. Hold this position for __________ seconds. Bring your fingers and thumb tight together again. Hold this position for __________ seconds. Repeat this exercise 5-10 times with each hand. Making circles  Stand or sit with your arm, hand, and all five fingers pointed straight up. Make sure to keep your wrist straight during the exercise. Make a circle by touching the tip of your thumb to the tip of your index finger. Hold for __________ seconds. Then open your hand wide. Repeat this motion with your thumb and each finger on your hand. Repeat this exercise 5-10 times with each hand. Thumb motion  Sit with your forearm resting on a table and your wrist straight. Your thumb should be facing up toward the ceiling. Keep your fingers relaxed as you move your thumb. Lift your thumb up as high as you can toward the ceiling. Hold for __________ seconds. Bend your thumb across your palm as far as you can, reaching the tip of your thumb for the small finger (pinkie) side of your palm. Hold for __________ seconds. Repeat this exercise 5-10 times with each hand. Grip strengthening  Hold a stress ball or other soft ball in the middle of your hand. Slowly increase the pressure, squeezing the ball as much as you can without causing pain. Think of bringing the tips of your fingers into the middle of your palm. All of your finger joints should bend when doing this exercise. Hold your squeeze for __________ seconds, then relax. Repeat this exercise 5-10 times with each hand. Contact a health care provider if: Your hand pain or  discomfort gets much worse when you do an exercise. Your hand pain or discomfort does not improve within 2 hours after you exercise. If you have any of these problems, stop doing these exercises right away. Do not do them again unless your health care provider says that you can. Get help right away if: You develop sudden, severe hand pain or swelling. If this happens, stop doing these exercises right away. Do not do them again unless your health care provider says that you can. This information is not intended to replace advice given to you by your health care provider. Make sure you discuss any questions you have with your health care provider. Document Revised: 09/20/2020 Document Reviewed: 09/20/2020 Elsevier Patient Education  [place]. Knee Exercises Ask your health care provider which exercises are safe for you. Do exercises exactly as told by your health care provider and adjust them as directed. It is normal to feel mild stretching, pulling, tightness, or discomfort as you do these exercises. Stop right away if you feel sudden pain or your pain gets worse. Do not begin these exercises until told by your health care provider. Stretching and range-of-motion exercises These exercises warm up your muscles and joints and improve the movement and flexibility of your knee. These exercises also help to relieve pain and swelling. Knee extension, prone  Lie on your abdomen (prone position) on a bed. Place your left / right knee just beyond the edge of the surface so your knee is not on the bed. You can put a towel under your left / right thigh just above your kneecap for comfort. Relax your leg muscles and allow gravity to straighten your knee (extension). You should  feel a stretch behind your left / right knee. Hold this position for __________ seconds. Scoot up so your knee is supported between repetitions. Repeat __________ times. Complete this exercise __________ times a day. Knee  flexion, active  Lie on your back with both legs straight. If this causes back discomfort, bend your left / right knee so your foot is flat on the floor. Slowly slide your left / right heel back toward your buttocks. Stop when you feel a gentle stretch in the front of your knee or thigh (flexion). Hold this position for __________ seconds. Slowly slide your left / right heel back to the starting position. Repeat __________ times. Complete this exercise __________ times a day. Quadriceps stretch, prone  Lie on your abdomen on a firm surface, such as a bed or padded floor. Bend your left / right knee and hold your ankle. If you cannot reach your ankle or pant leg, loop a belt around your foot and grab the belt instead. Gently pull your heel toward your buttocks. Your knee should not slide out to the side. You should feel a stretch in the front of your thigh and knee (quadriceps). Hold this position for __________ seconds. Repeat __________ times. Complete this exercise __________ times a day. Hamstring, supine  Lie on your back (supine position). Loop a belt or towel over the ball of your left / right foot. The ball of your foot is on the walking surface, right under your toes. Straighten your left / right knee and slowly pull on the belt to raise your leg until you feel a gentle stretch behind your knee (hamstring). Do not let your knee bend while you do this. Keep your other leg flat on the floor. Hold this position for __________ seconds. Repeat __________ times. Complete this exercise __________ times a day. Strengthening exercises These exercises build strength and endurance in your knee. Endurance is the ability to use your muscles for a long time, even after they get tired. Quadriceps, isometric This exercise strengthens the muscles in front of your thigh (quadriceps) without moving your knee joint (isometric). Lie on your back with your left / right leg extended and your other knee  bent. Put a rolled towel or small pillow under your knee if told by your health care provider. Slowly tense the muscles in the front of your left / right thigh. You should see your kneecap slide up toward your hip or see increased dimpling just above the knee. This motion will push the back of the knee toward the floor. For __________ seconds, hold the muscle as tight as you can without increasing your pain. Relax the muscles slowly and completely. Repeat __________ times. Complete this exercise __________ times a day. Straight leg raises This exercise strengthens the muscles in front of your thigh (quadriceps) and the muscles that move your hips (hip flexors). Lie on your back with your left / right leg extended and your other knee bent. Tense the muscles in the front of your left / right thigh. You should see your kneecap slide up or see increased dimpling just above the knee. Your thigh may even shake a bit. Keep these muscles tight as you raise your leg 4-6 inches (10-15 cm) off the floor. Do not let your knee bend. Hold this position for __________ seconds. Keep these muscles tense as you lower your leg. Relax your muscles slowly and completely after each repetition. Repeat __________ times. Complete this exercise __________ times a day. Hamstring, isometric  Lie on your back on a firm surface. Bend your left / right knee about __________ degrees. Dig your left / right heel into the surface as if you are trying to pull it toward your buttocks. Tighten the muscles in the back of your thighs (hamstring) to "dig" as hard as you can without increasing any pain. Hold this position for __________ seconds. Release the tension gradually and allow your muscles to relax completely for __________ seconds after each repetition. Repeat __________ times. Complete this exercise __________ times a day. Hamstring curls If told by your health care provider, do this exercise while wearing ankle weights.  Begin with __________lb / kg weights. Then increase the weight by 1 lb (0.5 kg) increments. Do not wear ankle weights that are more than __________lb / kg. Lie on your abdomen with your legs straight. Bend your left / right knee as far as you can without feeling pain. Keep your hips flat against the floor. Hold this position for __________ seconds. Slowly lower your leg to the starting position. Repeat __________ times. Complete this exercise __________ times a day. Squats This exercise strengthens the muscles in front of your thigh and knee (quadriceps). Stand in front of a table, with your feet and knees pointing straight ahead. You may rest your hands on the table for balance but not for support. Slowly bend your knees and lower your hips like you are going to sit in a chair. Keep your weight over your heels, not over your toes. Keep your lower legs upright so they are parallel with the table legs. Do not let your hips go lower than your knees. Do not bend lower than told by your health care provider. If your knee pain increases, do not bend as low. Hold the squat position for __________ seconds. Slowly push with your legs to return to standing. Do not use your hands to pull yourself to standing. Repeat __________ times. Complete this exercise __________ times a day. Wall slides This exercise strengthens the muscles in front of your thigh and knee (quadriceps). Lean your back against a smooth wall or door, and walk your feet out 18-24 inches (46-61 cm) from it. Place your feet hip-width apart. Slowly slide down the wall or door until your knees bend __________ degrees. Keep your knees over your heels, not over your toes. Keep your knees in line with your hips. Hold this position for __________ seconds. Repeat __________ times. Complete this exercise __________ times a day. Straight leg raises, side-lying This exercise strengthens the muscles that rotate the leg at the hip and move it  away from your body (hip abductors). Lie on your side with your left / right leg in the top position. Lie so your head, shoulder, knee, and hip line up. You may bend your bottom knee to help you keep your balance. Roll your hips slightly forward so your hips are stacked directly over each other and your left / right knee is facing forward. Leading with your heel, lift your top leg 4-6 inches (10-15 cm). You should feel the muscles in your outer hip lifting. Do not let your foot drift forward. Do not let your knee roll toward the ceiling. Hold this position for __________ seconds. Slowly return your leg to the starting position. Let your muscles relax completely after each repetition. Repeat __________ times. Complete this exercise __________ times a day. Straight leg raises, prone This exercise stretches the muscles that move your hips away from the front of the pelvis (  hip extensors). Lie on your abdomen on a firm surface. You can put a pillow under your hips if that is more comfortable. Tense the muscles in your buttocks and lift your left / right leg about 4-6 inches (10-15 cm). Keep your knee straight as you lift your leg. Hold this position for __________ seconds. Slowly lower your leg to the starting position. Let your leg relax completely after each repetition. Repeat __________ times. Complete this exercise __________ times a day. This information is not intended to replace advice given to you by your health care provider. Make sure you discuss any questions you have with your health care provider. Document Revised: 02/12/2021 Document Reviewed: 02/12/2021 Elsevier Patient Education  [place] [facility] Ask your health care provider which exercises are safe for you. Do exercises exactly as told by your health care provider and adjust them as directed. It is normal to feel mild stretching, pulling, tightness, or discomfort as you do these exercises. Stop  right away if you feel sudden pain or your pain gets significantly worse. Do not begin these exercises until told by your health care provider. Stretching and range-of-motion exercises These exercises warm up your muscles and joints and improve the movement and flexibility of your hip and pelvis. Quadriceps stretch, prone  Lie on your abdomen (prone position) on a firm surface, such as a bed or padded floor. Bend your left / right knee and reach back to hold your ankle or pant leg. If you cannot reach your ankle or pant leg, loop a belt around your foot and grab the belt instead. Gently pull your heel toward your buttocks. Your knee should not slide out to the side. You should feel a stretch in the front of your thigh and knee (quadriceps). Hold this position for __________ seconds. Repeat __________ times. Complete this exercise __________ times a day. Iliotibial band stretch An iliotibial band is a strong band of muscle tissue that runs from the outer side of your hip to the outer side of your thigh and knee. Lie on your side with your left / right leg in the top position. Bend both of your knees and grab your left / right ankle. Stretch out your bottom arm to help you balance. Slowly bring your top knee back so your thigh goes behind your trunk. Slowly lower your top leg toward the floor until you feel a gentle stretch on the outside of your left / right hip and thigh. If you do not feel a stretch and your knee will not fall farther, place the heel of your other foot on top of your knee and pull your knee down toward the floor with your foot. Hold this position for __________ seconds. Repeat __________ times. Complete this exercise __________ times a day. Strengthening exercises These exercises build strength and endurance in your hip and pelvis. Endurance is the ability to use your muscles for a long time, even after they get tired. Straight leg raises, side-lying This exercise strengthens  the muscles that rotate the leg at the hip and move it away from your body (hip abductors). Lie on your side with your left / right leg in the top position. Lie so your head, shoulder, hip, and knee line up. You may bend your bottom knee to help you balance. Roll your hips slightly forward so your hips are stacked directly over each other and your left / right knee is facing forward. Tense the muscles in your outer thigh  and lift your top leg 4-6 inches (10-15 cm). Hold this position for __________ seconds. Slowly lower your leg to return to the starting position. Let your muscles relax completely before doing another repetition. Repeat __________ times. Complete this exercise __________ times a day. Leg raises, prone This exercise strengthens the muscles that move the hips backward (hip extensors). Lie on your abdomen (prone position) on your bed or a firm surface. You can put a pillow under your hips if that is more comfortable for your lower back. Bend your left / right knee so your foot is straight up in the air. Squeeze your buttocks muscles and lift your left / right thigh off the bed. Do not let your back arch. Tense your thigh muscle as hard as you can without increasing any knee pain. Hold this position for __________ seconds. Slowly lower your leg to return to the starting position and allow it to relax completely. Repeat __________ times. Complete this exercise __________ times a day. Hip hike Stand sideways on a bottom step. Stand on your left / right leg with your other foot unsupported next to the step. You can hold on to a railing or wall for balance if needed. Keep your knees straight and your torso square. Then lift your left / right hip up toward the ceiling. Slowly let your left / right hip lower toward the floor, past the starting position. Your foot should get closer to the floor. Do not lean or bend your knees. Repeat __________ times. Complete this exercise __________ times  a day. This information is not intended to replace advice given to you by your health care provider. Make sure you discuss any questions you have with your health care provider. Document Revised: 08/10/2019 Document Reviewed: 08/10/2019 Elsevier Patient Education  [place].

## 2021-11-04 ENCOUNTER — Other Ambulatory Visit: Payer: Self-pay | Admitting: Internal Medicine

## 2021-11-04 DIAGNOSIS — Z8616 Personal history of COVID-19: Secondary | ICD-10-CM

## 2021-11-04 DIAGNOSIS — M791 Myalgia, unspecified site: Secondary | ICD-10-CM

## 2021-11-04 DIAGNOSIS — F439 Reaction to severe stress, unspecified: Secondary | ICD-10-CM

## 2021-11-04 DIAGNOSIS — M2550 Pain in unspecified joint: Secondary | ICD-10-CM

## 2021-11-05 NOTE — Telephone Encounter (Signed)
Requested Prescriptions  Pending Prescriptions Disp Refills  . celecoxib (CELEBREX) 100 MG capsule [Pharmacy Med Name: CELECOXIB 100 MG CAPSULE] 60 capsule 2    Sig: TAKE 1 CAPSULE BY MOUTH TWICE A DAY     Analgesics:  COX2 Inhibitors Failed - 11/04/2021  2:25 AM      Failed - Manual Review: Labs are only required if the patient has taken medication for more than 8 weeks.      Passed - HGB in normal range and within 360 days    Hemoglobin  Date Value Ref Range Status  07/31/2021 12.6 11.7 - 15.5 g/dL Final  09/18/2016 13.2 11.1 - 15.9 g/dL Final         Passed - Cr in normal range and within 360 days    Creat  Date Value Ref Range Status  07/31/2021 0.74 0.50 - 1.03 mg/dL Final         Passed - HCT in normal range and within 360 days    HCT  Date Value Ref Range Status  07/31/2021 37.1 35.0 - 45.0 % Final   Hematocrit  Date Value Ref Range Status  09/18/2016 38.1 34.0 - 46.6 % Final         Passed - AST in normal range and within 360 days    AST  Date Value Ref Range Status  07/31/2021 23 10 - 35 U/L Final         Passed - ALT in normal range and within 360 days    ALT  Date Value Ref Range Status  07/31/2021 28 6 - 29 U/L Final         Passed - eGFR is 30 or above and within 360 days    GFR calc Af Amer  Date Value Ref Range Status  12/01/2019 >60 >60 mL/min Final   GFR calc non Af Amer  Date Value Ref Range Status  12/01/2019 >60 >60 mL/min Final   GFR  Date Value Ref Range Status  03/19/2020 109.09 >60.00 mL/min Final   eGFR  Date Value Ref Range Status  07/31/2021 95 > OR = 60 mL/min/1.39m2 Final    Comment:    The eGFR is based on the CKD-EPI 2021 equation. To calculate  the new eGFR from a previous Creatinine or Cystatin C result, go to https://www.kidney.org/professionals/ kdoqi/gfr%5Fcalculator          Passed - Patient is not pregnant      Passed - Valid encounter within last 12 months    Recent Outpatient Visits          1 week ago  [facility] [place], Coralie Keens, NP   3 months ago [facility] [place], Coralie Keens, NP   6 months ago Encounter for completion of form with patient   [facility], Coralie Keens, NP   7 months ago Encounter for general adult medical examination with abnormal findings   [facility] [place], Coralie Keens, NP   8 months ago Adjustment insomnia   [facility] [place], Coralie Keens, NP      Future Appointments            In 2 months [name] [facility]

## 2021-11-27 ENCOUNTER — Ambulatory Visit: Payer: 59 | Admitting: Rheumatology

## 2021-11-28 ENCOUNTER — Encounter: Payer: Self-pay | Admitting: Rheumatology

## 2021-11-28 NOTE — Telephone Encounter (Signed)
She may get medical records which should have information regarding fibromyalgia and Sjogren's.  Please refer her to pain management.

## 2021-12-19 ENCOUNTER — Ambulatory Visit: Payer: 59 | Admitting: Rheumatology

## 2022-01-13 NOTE — Progress Notes (Unsigned)
Office Visit Note  Patient: [name]             Date of Birth: 06-05-1966           MRN: [id number]             PCP: Jearld Fenton, NP Referring: Jearld Fenton, NP Visit Date: 01/15/2022 Occupation: '@GUAROCC'$ @  Subjective:  No chief complaint on file.   History of Present Illness: [name] is a 56 y.o. female ***   Activities of Daily Living:  Patient reports morning stiffness for *** {minute/hour:19697}.   Patient {ACTIONS;DENIES/REPORTS:21021675::"Denies"} nocturnal pain.  Difficulty dressing/grooming: {ACTIONS;DENIES/REPORTS:21021675::"Denies"} Difficulty climbing stairs: {ACTIONS;DENIES/REPORTS:21021675::"Denies"} Difficulty getting out of chair: {ACTIONS;DENIES/REPORTS:21021675::"Denies"} Difficulty using hands for taps, buttons, cutlery, and/or writing: {ACTIONS;DENIES/REPORTS:21021675::"Denies"}  No Rheumatology ROS completed.   PMFS History:  Patient Active Problem List   Diagnosis Date Noted   Primary osteoarthritis of both hands 10/31/2021   Fibromyalgia 10/24/2021   Sjogren's syndrome ([place]) 10/24/2021   DDD (degenerative disc disease), lumbar 10/14/2021   Caregiver stress 02/14/2021   Insomnia 02/14/2021   Prediabetes 12/31/2020   HLD (hyperlipidemia) 12/31/2020   Frequent headaches 12/31/2020   Morbid obesity ([place]) 12/31/2020   Gastroesophageal reflux disease without esophagitis 11/15/2018   Arthralgia of multiple joints 06/02/2016   Anxiety and depression 08/03/2014    Past Medical History:  Diagnosis Date   Allergy    DDD (degenerative disc disease), lumbar    Depression     Family History  Problem Relation Age of Onset   Hyperlipidemia Mother    Rheum arthritis Sister    Osteoarthritis Sister    Fibroids Sister    Fibroids Sister    Fibroids Sister    Heart disease Maternal Aunt    Stroke Maternal Aunt    Diabetes Maternal Aunt    Heart disease Maternal Uncle    Stroke Maternal Uncle    Rheum arthritis Nephew     Cancer Neg Hx    Past Surgical History:  Procedure Laterality Date   ABDOMINAL HYSTERECTOMY  2008   LAVH, endometriosis ablation, mccall culdoplasty   CHOLECYSTECTOMY N/A 08/31/2019   Procedure: LAPAROSCOPIC CHOLECYSTECTOMY;  Surgeon: Olean Ree, MD;  Location: [facility] ORS;  Service: General;  Laterality: N/A;   DIAGNOSTIC LAPAROSCOPY  2002   Hysteroscopy, diagnostic laparoscopy, extensive lysis of adhesions, endometrial ablation, RSO   RIGHT OOPHORECTOMY  2007   with right salpingectomy   TUBAL LIGATION     Social History   Social History Narrative   Not on file   Immunization History  Administered Date(s) Administered   Influenza,inj,Quad PF,6+ Mos 03/17/2015, 03/17/2017, 02/25/2019, 03/19/2020, 03/28/2021   PFIZER(Purple Top)SARS-COV-2 Vaccination 07/11/2019, 08/01/2019   Tdap 02/09/2007, 09/18/2016     Objective: Vital Signs: There were no vitals taken for this visit.   Physical Exam   Musculoskeletal Exam: ***  CDAI Exam: CDAI Score: -- Patient Global: --; Provider Global: -- Swollen: --; Tender: -- Joint Exam 01/15/2022   No joint exam has been documented for this visit   There is currently no information documented on the homunculus. Go to the Rheumatology activity and complete the homunculus joint exam.  Investigation: No additional findings.  Imaging: No results found.  Recent Labs: Lab Results  Component Value Date   WBC 6.1 07/31/2021   HGB 12.6 07/31/2021   PLT 216 07/31/2021   NA 143 07/31/2021   K 3.9 07/31/2021   CL 110 07/31/2021   CO2 27 07/31/2021   GLUCOSE 81 07/31/2021  BUN 15 07/31/2021   CREATININE 0.74 07/31/2021   BILITOT 0.4 07/31/2021   ALKPHOS 62 03/19/2020   AST 23 07/31/2021   ALT 28 07/31/2021   PROT 7.4 07/31/2021   ALBUMIN 3.9 03/19/2020   CALCIUM 9.5 07/31/2021   GFRAA >60 12/01/2019    Speciality Comments: No specialty comments available.  Procedures:  No procedures performed Allergies: Gabapentin,  Hydrochlorothiazide, Penicillins, and Orange juice [orange oil]   Assessment / Plan:     Visit Diagnoses: Sjogren's syndrome with keratoconjunctivitis sicca (HCC)  Primary osteoarthritis of both hands  Paresthesia of both hands  Trochanteric bursitis of both hips  Chronic pain of both knees  DDD (degenerative disc disease), lumbar  Fibromyalgia  Hypermobility of joint  Prediabetes  Pure hypertriglyceridemia  Anxiety and depression  Adjustment insomnia  Gastroesophageal reflux disease without esophagitis  Frequent headaches  Morbid obesity ([place])  Seasonal allergies  Orders: No orders of the defined types were placed in this encounter.  No orders of the defined types were placed in this encounter.   Face-to-face time spent with patient was *** minutes. Greater than 50% of time was spent in counseling and coordination of care.  Follow-Up Instructions: No follow-ups on file.   Ofilia Neas, PA-C  Note - This record has been created using Dragon software.  Chart creation errors have been sought, but may not always  have been located. Such creation errors do not reflect on  the standard of medical care.

## 2022-01-14 DIAGNOSIS — J189 Pneumonia, unspecified organism: Secondary | ICD-10-CM

## 2022-01-14 HISTORY — DX: Pneumonia, unspecified organism: J18.9

## 2022-01-15 ENCOUNTER — Encounter: Payer: Self-pay | Admitting: Physician Assistant

## 2022-01-15 ENCOUNTER — Emergency Department: Payer: 59

## 2022-01-15 ENCOUNTER — Encounter: Payer: Self-pay | Admitting: Emergency Medicine

## 2022-01-15 ENCOUNTER — Ambulatory Visit: Payer: 59 | Attending: Physician Assistant | Admitting: Physician Assistant

## 2022-01-15 VITALS — BP 130/84 | HR 106 | Resp 16 | Ht 65.0 in | Wt 278.4 lb

## 2022-01-15 DIAGNOSIS — M797 Fibromyalgia: Secondary | ICD-10-CM

## 2022-01-15 DIAGNOSIS — R202 Paresthesia of skin: Secondary | ICD-10-CM | POA: Diagnosis not present

## 2022-01-15 DIAGNOSIS — M7062 Trochanteric bursitis, left hip: Secondary | ICD-10-CM

## 2022-01-15 DIAGNOSIS — R519 Headache, unspecified: Secondary | ICD-10-CM

## 2022-01-15 DIAGNOSIS — M5136 Other intervertebral disc degeneration, lumbar region: Secondary | ICD-10-CM

## 2022-01-15 DIAGNOSIS — F419 Anxiety disorder, unspecified: Secondary | ICD-10-CM

## 2022-01-15 DIAGNOSIS — F5102 Adjustment insomnia: Secondary | ICD-10-CM

## 2022-01-15 DIAGNOSIS — M7061 Trochanteric bursitis, right hip: Secondary | ICD-10-CM | POA: Diagnosis not present

## 2022-01-15 DIAGNOSIS — F32A Depression, unspecified: Secondary | ICD-10-CM

## 2022-01-15 DIAGNOSIS — M3501 Sicca syndrome with keratoconjunctivitis: Secondary | ICD-10-CM

## 2022-01-15 DIAGNOSIS — J302 Other seasonal allergic rhinitis: Secondary | ICD-10-CM

## 2022-01-15 DIAGNOSIS — M19041 Primary osteoarthritis, right hand: Secondary | ICD-10-CM

## 2022-01-15 DIAGNOSIS — M249 Joint derangement, unspecified: Secondary | ICD-10-CM

## 2022-01-15 DIAGNOSIS — K219 Gastro-esophageal reflux disease without esophagitis: Secondary | ICD-10-CM

## 2022-01-15 DIAGNOSIS — M25561 Pain in right knee: Secondary | ICD-10-CM

## 2022-01-15 DIAGNOSIS — R7303 Prediabetes: Secondary | ICD-10-CM

## 2022-01-15 DIAGNOSIS — M25562 Pain in left knee: Secondary | ICD-10-CM

## 2022-01-15 DIAGNOSIS — M19042 Primary osteoarthritis, left hand: Secondary | ICD-10-CM

## 2022-01-15 DIAGNOSIS — R079 Chest pain, unspecified: Secondary | ICD-10-CM | POA: Diagnosis present

## 2022-01-15 DIAGNOSIS — J181 Lobar pneumonia, unspecified organism: Secondary | ICD-10-CM | POA: Insufficient documentation

## 2022-01-15 DIAGNOSIS — E781 Pure hyperglyceridemia: Secondary | ICD-10-CM

## 2022-01-15 DIAGNOSIS — G8929 Other chronic pain: Secondary | ICD-10-CM

## 2022-01-15 LAB — CBC
HCT: 37.8 % (ref 36.0–46.0)
Hemoglobin: 12.3 g/dL (ref 12.0–15.0)
MCH: 30.5 pg (ref 26.0–34.0)
MCHC: 32.5 g/dL (ref 30.0–36.0)
MCV: 93.8 fL (ref 80.0–100.0)
Platelets: 227 10*3/uL (ref 150–400)
RBC: 4.03 MIL/uL (ref 3.87–5.11)
RDW: 13.1 % (ref 11.5–15.5)
WBC: 5.5 10*3/uL (ref 4.0–10.5)
nRBC: 0 % (ref 0.0–0.2)

## 2022-01-15 MED ORDER — DULOXETINE HCL 30 MG PO CPEP: 30.0000 mg | ORAL_CAPSULE | Freq: Every day | ORAL | 0 refills | Status: DC

## 2022-01-15 NOTE — ED Triage Notes (Signed)
Pt c/o intermittent left sided chest pain radiating up her neck and into left posterior shoulder x1 week. Pt sts she has had recent stressors including losing her job on Monday and her husband currently battling cancer. Pt tearful in triage.

## 2022-01-16 ENCOUNTER — Other Ambulatory Visit: Payer: Self-pay

## 2022-01-16 ENCOUNTER — Emergency Department
Admission: EM | Admit: 2022-01-16 | Discharge: 2022-01-16 | Disposition: A | Discharge status: Home / Self Care | Payer: 59 | Attending: Emergency Medicine | Admitting: Emergency Medicine

## 2022-01-16 DIAGNOSIS — R0789 Other chest pain: Secondary | ICD-10-CM

## 2022-01-16 DIAGNOSIS — R519 Headache, unspecified: Secondary | ICD-10-CM

## 2022-01-16 DIAGNOSIS — J189 Pneumonia, unspecified organism: Secondary | ICD-10-CM

## 2022-01-16 LAB — BASIC METABOLIC PANEL
Anion gap: 9 (ref 5–15)
BUN: 16 mg/dL (ref 6–20)
CO2: 19 mmol/L — ABNORMAL LOW (ref 22–32)
Calcium: 9.7 mg/dL (ref 8.9–10.3)
Chloride: 113 mmol/L — ABNORMAL HIGH (ref 98–111)
Creatinine, Ser: 0.8 mg/dL (ref 0.44–1.00)
GFR, Estimated: >60 mL/min (ref >60)
Glucose, Bld: 140 mg/dL — ABNORMAL HIGH (ref 70–99)
Potassium: 4 mmol/L (ref 3.5–5.1)
Sodium: 141 mmol/L (ref 135–145)

## 2022-01-16 LAB — TROPONIN I (HIGH SENSITIVITY)
Troponin I (High Sensitivity): 2 ng/L (ref <18)
Troponin I (High Sensitivity): <2 ng/L (ref <18)

## 2022-01-16 MED ORDER — DOXYCYCLINE HYCLATE 100 MG PO TABS: 100.0000 mg | ORAL_TABLET | Freq: Two times a day (BID) | ORAL | 0 refills | Status: AC

## 2022-01-16 MED ORDER — ACETAMINOPHEN 500 MG PO TABS
1000.0000 mg | ORAL_TABLET | Freq: Once | ORAL | Status: AC
  Administered 2022-01-16: 1000 mg via ORAL
  Filled 2022-01-16: qty 2

## 2022-01-16 NOTE — ED Provider Notes (Signed)
[facility] Provider Note    Event Date/Time   First MD Initiated Contact with Patient 01/16/22 [phone]     (approximate)   History   Chest Pain   HPI  Chiara Coltrin Budney is a 56 y.o. female who presents to the ED for evaluation of Chest Pain   Review PCP visit from 5/11.  Morbidly obese patient with history of fibromyalgia, Sjogren's syndrome.  No documented cardiac history  Patient presents to the ED for evaluation of intermittent episodes of headache and chest pain for the past 1+ week.  Also reports concurrent productive cough.  Reports a handful of episodes of the past week, not even occurring every day, where she has bilateral headache, radiating inferiorly down her neck towards her shoulders and chest, sometimes down her left arm. Alongside of these episodes, she denies any syncope, fevers or trauma, vision changes, weakness of extremities.   Denies chest pain right now.  But does report having a productive cough of the past 1 week and sometimes feeling short of breath with ambulation and coughing fits.  Her husband is a cancer patient and requires a lot of care, due to her time commitment to her husband she recently lost her job in the past week and she reports having increased stress in her life.   Physical Exam   Triage Vital Signs: ED Triage Vitals  Enc Vitals Group     BP 01/15/22 2326 (!) 150/93     Pulse Rate 01/15/22 2326 (!) 104     Resp 01/15/22 2326 (!) 22     Temp 01/15/22 2326 98.4 F (36.9 C)     Temp Source 01/15/22 2326 Oral     SpO2 01/15/22 2326 98 %     Weight 01/15/22 2327 278 lb (126.1 kg)     Height 01/15/22 2327 '5\' 5"'$  (1.651 m)     Head Circumference --      Peak Flow --      Pain Score --      Pain Loc --      Pain Edu? --      Excl. in [place]? --     Most recent vital signs: Vitals:   01/16/22 0135 01/16/22 0253  BP: (!) 151/78 (!) 140/72  Pulse: 87 80  Resp: 16 18  Temp: 98.7 F (37.1 C) 98.7 F (37.1 C)   SpO2: 96% 98%    General: Awake, no distress.  Tearful when discussing her home psychosocial situation, but largely well-appearing.  Ambulatory with normal gait. CV:  Good peripheral perfusion. RRR Resp:  Normal effort. CTAB Abd:  No distention. Soft and benign MSK:  No deformity noted.  Neuro:  No focal deficits appreciated. Cranial nerves II through XII intact 5/5 strength and sensation in all 4 extremities Other:     ED Results / Procedures / Treatments   Labs (all labs ordered are listed, but only abnormal results are displayed) Labs Reviewed  BASIC METABOLIC PANEL - Abnormal; Notable for the following components:      Result Value   Chloride 113 (*)    CO2 19 (*)    Glucose, Bld 140 (*)    All other components within normal limits  CBC  TROPONIN I (HIGH SENSITIVITY)  TROPONIN I (HIGH SENSITIVITY)    EKG Treatments baseline Detailed hypertension occult.  Demonstrates sinus rhythm rate of 90 bpm.  Normal axis and intervals.  No clear signs of acute ischemia.  RADIOLOGY 2 view CXR interpreted by  me with left basilar atelectasis versus infiltrate  Official radiology report(s): DG Chest 2 View  Result Date: 01/16/2022 CLINICAL DATA:  Left-sided chest pain. EXAM: CHEST - 2 VIEW COMPARISON:  03/11/2016 FINDINGS: The heart is normal in size.The cardiomediastinal contours are normal. Minimal patchy left lung base opacity. Pulmonary vasculature is normal. No pleural effusion or pneumothorax. No acute osseous abnormalities are seen. IMPRESSION: Minimal patchy left lung base opacity, atelectasis versus pneumonia. Electronically Signed   By: Keith Rake M.D.   On: 01/16/2022 00:21    PROCEDURES and INTERVENTIONS:  Procedures  Medications  acetaminophen (TYLENOL) tablet 1,000 mg (1,000 mg Oral Given 01/16/22 0128)     IMPRESSION / MDM / ASSESSMENT AND PLAN / ED COURSE  I reviewed the triage vital signs and the nursing notes.  Differential diagnosis includes, but is not  limited to, ACS, PTX, PNA, muscle strain/spasm, PE, dissection  {Patient presents with symptoms of an acute illness or injury that is potentially life-threatening.  Pleasant 56 year old female presents to the ED with intermittent head and chest pain, alongside a productive cough, evidence of possible community-acquired pneumonia and ultimately suitable for outpatient management.  She looks systemically well.  Her mild tachycardia is noted in triage, and this self resolves and does not recur.  EKG with a sinus rhythm without acute ischemic features.  No leukocytosis or further signs of sepsis.  Metabolic panel with slight decrease bicarbonate, otherwise reassuring electrolytes.  Negative troponin.  CXR questioned a left basilar infiltrate.  Alongside her productive cough, I believe treatment of CAP would be prudent.  We will get her started on a week of doxycycline.  I consider CTA chest to evaluate for PE, but she is requesting discharge in the setting of her husband needed to go home in the setting of his poorly controlled pain in the setting of his cancer.  We discussed return precautions for the ED and she is suitable for outpatient management.      FINAL CLINICAL IMPRESSION(S) / ED DIAGNOSES   Final diagnoses:  Other chest pain  Bad headache  Community acquired pneumonia of left lower lobe of lung     Rx / DC Orders   ED Discharge Orders          Ordered    doxycycline (VIBRA-TABS) 100 MG tablet  2 times daily        01/16/22 0250             Note:  This document was prepared using Dragon voice recognition software and may include unintentional dictation errors.   Vladimir Crofts, MD 01/16/22 [phone]

## 2022-01-16 NOTE — Discharge Instructions (Signed)
Use Tylenol for pain and fevers.  Up to 1000 mg per dose, up to 4 times per day.  Do not take more than 4000 mg of Tylenol/acetaminophen within 24 hours..  Please take the doxycycline antibiotic twice daily for the next 1 week.  If you develop any worsening symptoms please return to the

## 2022-01-16 NOTE — ED Notes (Signed)
Pt up to first nurse desk c/o bilateral head pain radiating to neck and chest that comes on intensely.  Repeat EKG done and given to Dr. Tamala Julian, VORB 1g tylenol.

## 2022-01-17 ENCOUNTER — Encounter: Payer: Self-pay | Admitting: Internal Medicine

## 2022-01-17 ENCOUNTER — Telehealth (INDEPENDENT_AMBULATORY_CARE_PROVIDER_SITE_OTHER): Payer: 59 | Admitting: Internal Medicine

## 2022-01-17 DIAGNOSIS — M19041 Primary osteoarthritis, right hand: Secondary | ICD-10-CM | POA: Diagnosis not present

## 2022-01-17 DIAGNOSIS — M51369 Other intervertebral disc degeneration, lumbar region without mention of lumbar back pain or lower extremity pain: Secondary | ICD-10-CM

## 2022-01-17 DIAGNOSIS — M3500 Sicca syndrome, unspecified: Secondary | ICD-10-CM

## 2022-01-17 DIAGNOSIS — F32A Depression, unspecified: Secondary | ICD-10-CM

## 2022-01-17 DIAGNOSIS — M5136 Other intervertebral disc degeneration, lumbar region: Secondary | ICD-10-CM

## 2022-01-17 DIAGNOSIS — R519 Headache, unspecified: Secondary | ICD-10-CM

## 2022-01-17 DIAGNOSIS — J189 Pneumonia, unspecified organism: Secondary | ICD-10-CM | POA: Diagnosis not present

## 2022-01-17 DIAGNOSIS — M19042 Primary osteoarthritis, left hand: Secondary | ICD-10-CM

## 2022-01-17 DIAGNOSIS — M797 Fibromyalgia: Secondary | ICD-10-CM

## 2022-01-17 DIAGNOSIS — K219 Gastro-esophageal reflux disease without esophagitis: Secondary | ICD-10-CM

## 2022-01-17 DIAGNOSIS — E781 Pure hyperglyceridemia: Secondary | ICD-10-CM

## 2022-01-17 DIAGNOSIS — F419 Anxiety disorder, unspecified: Secondary | ICD-10-CM

## 2022-01-17 DIAGNOSIS — R7303 Prediabetes: Secondary | ICD-10-CM

## 2022-01-17 DIAGNOSIS — F5102 Adjustment insomnia: Secondary | ICD-10-CM

## 2022-01-17 MED ORDER — DULOXETINE HCL 30 MG PO CPEP
30.0000 mg | ORAL_CAPSULE | Freq: Every day | ORAL | 1 refills | Status: DC
Start: 2022-01-17 — End: 2022-05-27

## 2022-01-17 MED ORDER — HYDROXYZINE PAMOATE 25 MG PO CAPS: 25.0000 mg | ORAL_CAPSULE | Freq: Every day | ORAL | 1 refills | Status: DC | PRN

## 2022-01-17 MED ORDER — CELECOXIB 100 MG PO CAPS: 100.0000 mg | ORAL_CAPSULE | Freq: Two times a day (BID) | ORAL | 0 refills | Status: DC

## 2022-01-17 MED ORDER — PANTOPRAZOLE SODIUM 40 MG PO TBEC: 40.0000 mg | DELAYED_RELEASE_TABLET | Freq: Two times a day (BID) | ORAL | 1 refills | Status: DC

## 2022-01-17 MED ORDER — ATORVASTATIN CALCIUM 10 MG PO TABS: 10.0000 mg | ORAL_TABLET | Freq: Every day | ORAL | 1 refills | Status: DC

## 2022-01-17 MED ORDER — METAXALONE 800 MG PO TABS
800.0000 mg | ORAL_TABLET | Freq: Every day | ORAL | 1 refills | Status: DC | PRN
Start: 2022-01-17 — End: 2022-05-27

## 2022-01-17 MED ORDER — FAMOTIDINE 20 MG PO TABS: 20.0000 mg | ORAL_TABLET | Freq: Two times a day (BID) | ORAL | 1 refills | Status: DC | PRN

## 2022-01-17 MED ORDER — TRAZODONE HCL 50 MG PO TABS: 25.0000 mg | ORAL_TABLET | Freq: Every evening | ORAL | 1 refills | Status: DC | PRN

## 2022-01-17 NOTE — Assessment & Plan Note (Signed)
Deteriorated, hydroxyzine not effective We will trial trazodone

## 2022-01-17 NOTE — Assessment & Plan Note (Signed)
Continue Tylenol OTC as needed Discussed using Excedrin Migraine as needed

## 2022-01-17 NOTE — Patient Instructions (Signed)

## 2022-01-17 NOTE — Assessment & Plan Note (Signed)
Encouraged her to start duloxetine Encouraged her to continue Celebrex, Skelaxin and Tylenol Encourage regular stretching and exercise

## 2022-01-17 NOTE — Assessment & Plan Note (Signed)
Encouraged her to start duloxetine Continue Celebrex, Skelaxin and Tylenol Encourage regular stretching

## 2022-01-17 NOTE — Assessment & Plan Note (Signed)
Try to avoid foods that trigger reflux Encourage weight loss as this can help reduce reflux symptoms Continue pantoprazole, refilled today

## 2022-01-17 NOTE — Assessment & Plan Note (Signed)
We will check A1c at annual exam

## 2022-01-17 NOTE — Assessment & Plan Note (Signed)
Persistent but has not been medicated Encouraged her to start duloxetine Continue hydroxyzine as needed Support offered

## 2022-01-17 NOTE — Assessment & Plan Note (Signed)
Encourage diet and exercise for weight loss 

## 2022-01-17 NOTE — Assessment & Plan Note (Signed)
Encouraged to restart duloxetine Continue Celebrex, Skelaxin and Tylenol

## 2022-01-17 NOTE — Progress Notes (Signed)
Virtual Visit via Video Note  I connected with [name] on 01/17/22 at  4:00 PM EDT by a video enabled telemedicine application and verified that I am speaking with the correct person using two identifiers.  Location: Patient: Home Provider: Office  Persons participating in this video call: Webb Silversmith, NP and Lorna Dibble.   I discussed the limitations of evaluation and management by telemedicine and the availability of in person appointments. The patient expressed understanding and agreed to proceed.  History of Present Illness:  Patient presents to clinic today for ER follow-up.  She presented to the ER 8/3 with complaint of headache, cough and chest pain.  Labs were essentially unremarkable.  ECG showed normal sinus rhythm.  Chest x-ray was concerning for atelectasis versus infiltrate.  She was started on Doxycycline.  They did recommend CTA chest however she requested to be discharged.  Since that time, she reports intermittent headaches, cough and chest pain but does feel like she is getting better.  GERD: Worse lately due to increased stress. She is taking Pantoprazole but is having some breakthrough. She has been taking Pepto Bismol, Tums and Famotidine OTC. Upper GI from 03/2020 reviewed.  OA/Fibromyalgia: She has not been taking Duloxetine. She is taking Celebrex and Skelaxin. She is not able to exercise.  She reports she has run out of the tramadol and is taking Tylenol OTC.  Anxiety and Depression: She is prescribed Duloxetine but has not been taking this. She is taking Hydroxyzine.  She is under a lot of stress, recently let go from her job and caring for her husband who has prostate cancer.  She denies SI/HI.  Frequent Headaches: Lately worse, likely due to stress. She takes Tylenol as needed with some relief of symptoms.   HLD: Her last LDL was 144, triglycerides 88, 03/2019. She is not taking Atorvastatin. She has not been consuming a low ft diet.  Insomnia:  She is having trying falling asleep and staying asleep. She takes Hydroxyzine with minimal relief of symptoms.  There is no sleep study on file.  Prediabetes: Her last A1c was 5.8%, 03/2021.  She is not taking any oral diabetic medication at this time.  She does not check her sugars.  Sjogren's: She reports dry mouth, dry eyes.  She was advised by rheumatology to take biotin mouthwash and refresh eyedrops.  Past Medical History:  Diagnosis Date   Allergy    DDD (degenerative disc disease), lumbar    Depression     Current Outpatient Medications  Medication Sig Dispense Refill   ALLEGRA-D ALLERGY & CONGESTION 180-240 MG 24 hr tablet TAKE 1 TABLET BY MOUTH EVERY DAY AS NEEDED 30 tablet 1   atorvastatin (LIPITOR) 10 MG tablet Take 1 tablet (10 mg total) by mouth daily. (Patient not taking: Reported on 10/14/2021) 90 tablet 0   celecoxib (CELEBREX) 100 MG capsule TAKE 1 CAPSULE BY MOUTH TWICE A DAY 60 capsule 2   diclofenac Sodium (VOLTAREN) 1 % GEL APPLY 2 GRAMS TO AFFECTED AREA 4 TIMES A DAY 100 g 0   doxycycline (VIBRA-TABS) 100 MG tablet Take 1 tablet (100 mg total) by mouth 2 (two) times daily for 7 days. 14 tablet 0   DULoxetine (CYMBALTA) 30 MG capsule Take 1 capsule (30 mg total) by mouth daily. 30 capsule 0   famotidine (PEPCID) 20 MG tablet Take 1 tablet (20 mg total) by mouth 2 (two) times daily as needed for heartburn or indigestion. 180 tablet 1   hydrOXYzine (VISTARIL)  25 MG capsule TAKE 1 CAPSULE (25 MG TOTAL) BY MOUTH DAILY AS NEEDED. 90 capsule 1   metaxalone (SKELAXIN) 800 MG tablet Take 1 tablet (800 mg total) by mouth daily as needed for muscle spasms. 30 tablet 1   pantoprazole (PROTONIX) 40 MG tablet TAKE 1 TABLET BY MOUTH TWICE A DAY 60 tablet 5   No current facility-administered medications for this visit.    Allergies  Allergen Reactions   Gabapentin Other (See Comments)    Hair loss   Hydrochlorothiazide Other (See Comments)    The pt state she couldn't function  and felt like she was dying    Penicillins Other (See Comments)    Childhood allergy Has patient had a PCN reaction causing immediate rash, facial/tongue/throat swelling, SOB or lightheadedness with hypotension: Unknown Has patient had a PCN reaction causing severe rash involving mucus membranes or skin necrosis: Unknown Has patient had a PCN reaction that required hospitalization: No  Has patient had a PCN reaction occurring within the last 10 years: No  If all of the above answers are "NO", then may proceed with Cephalosporin use.    Orange Juice [Orange Oil] Rash    rash    Family History  Problem Relation Age of Onset   Hyperlipidemia Mother    Rheum arthritis Sister    Osteoarthritis Sister    Fibroids Sister    Fibroids Sister    Fibroids Sister    Heart disease Maternal Aunt    Stroke Maternal Aunt    Diabetes Maternal Aunt    Heart disease Maternal Uncle    Stroke Maternal Uncle    Rheum arthritis Nephew    Cancer Neg Hx     Social History   Socioeconomic History   Marital status: Married    Spouse name: Not on file   Number of children: Not on file   Years of education: Not on file   Highest education level: Not on file  Occupational History   Not on file  Tobacco Use   Smoking status: Never    Passive exposure: Current   Smokeless tobacco: Never  Vaping Use   Vaping Use: Never used  Substance and Sexual Activity   Alcohol use: Yes    Comment: social   Drug use: Not Currently    Types: Marijuana   Sexual activity: Yes    Partners: Male    Birth control/protection: Post-menopausal, Surgical  Other Topics Concern   Not on file  Social History Narrative   Not on file   Social Determinants of Health   Financial Resource Strain: Not on file  Food Insecurity: Not on file  Transportation Needs: Not on file  Physical Activity: Not on file  Stress: Not on file  Social Connections: Not on file  Intimate Partner Violence: Not on file      Constitutional: Patient reports fatigue, headaches.  Denies fever, malaise, or abrupt weight changes.  HEENT: Patient reports dry eyes, dry mouth.  Denies eye pain, eye redness, ear pain, ringing in the ears, wax buildup, runny nose, nasal congestion, bloody nose, or sore throat. Respiratory: Patient reports intermittent cough.  Denies difficulty breathing, shortness of breath, or sputum production.   Cardiovascular: Patient reports intermittent chest pain.  Denies chest tightness, palpitations or swelling in the hands or feet.  Gastrointestinal: Patient reports reflux.  Denies abdominal pain, bloating, constipation, diarrhea or blood in the stool.  GU: Denies urgency, frequency, pain with urination, burning sensation, blood in urine, odor or discharge.  Musculoskeletal: Patient reports chronic joint and muscle pain.  Denies decrease in range of motion, difficulty with gait, or joint swelling.  Skin: Denies redness, rashes, lesions or ulcercations.  Neurological: Denies dizziness, difficulty with memory, difficulty with speech or problems with balance and coordination.  Psych: Patient has a history of anxiety and depression.  Denies SI/HI.  No other specific complaints in a complete review of systems (except as listed in HPI above).  Observations/Objective:  Wt Readings from Last 3 Encounters:  01/15/22 278 lb (126.1 kg)  01/15/22 278 lb 6.4 oz (126.3 kg)  10/31/21 273 lb (123.8 kg)    General: Appears her stated age, obese, in NAD. Pulmonary/Chest: Normal effort. No respiratory distress. Neurological: Alert and oriented.  Psychiatric: Mood and affect flat.  Behavior is normal. Judgment and thought content normal.    BMET    Component Value Date/Time   NA 141 01/15/2022 2334   NA 142 09/18/2016 0929   K 4.0 01/15/2022 2334   CL 113 (H) 01/15/2022 2334   CO2 19 (L) 01/15/2022 2334   GLUCOSE 140 (H) 01/15/2022 2334   BUN 16 01/15/2022 2334   BUN 9 09/18/2016 0929    CREATININE 0.80 01/15/2022 2334   CREATININE 0.79 01/15/2022 1503   CALCIUM 9.7 01/15/2022 2334   GFRNONAA >60 01/15/2022 2334   GFRAA >60 12/01/2019 1558    Lipid Panel     Component Value Date/Time   CHOL 236 (H) 03/28/2021 0838   CHOL 200 (H) 03/23/2017 0844   TRIG 88 03/28/2021 0838   HDL 73 03/28/2021 0838   HDL 57 03/23/2017 0844   CHOLHDL 3.2 03/28/2021 0838   VLDL 36.2 03/19/2020 0918   LDLCALC 144 (H) 03/28/2021 0838    CBC    Component Value Date/Time   WBC 5.5 01/15/2022 2334   RBC 4.03 01/15/2022 2334   HGB 12.3 01/15/2022 2334   HGB 13.2 09/18/2016 0929   HCT 37.8 01/15/2022 2334   HCT 38.1 09/18/2016 0929   PLT 227 01/15/2022 2334   PLT 268 09/18/2016 0929   MCV 93.8 01/15/2022 2334   MCV 94 09/18/2016 0929   MCH 30.5 01/15/2022 2334   MCHC 32.5 01/15/2022 2334   RDW 13.1 01/15/2022 2334   RDW 12.8 09/18/2016 0929   LYMPHSABS 992 01/15/2022 1503   LYMPHSABS 2.8 08/03/2014 1609   MONOABS 0.6 08/30/2019 0315   EOSABS 71 01/15/2022 1503   EOSABS 0.1 08/03/2014 1609   BASOSABS 28 01/15/2022 1503   BASOSABS 0.0 08/03/2014 1609    Hgb A1C Lab Results  Component Value Date   HGBA1C 5.8 (H) 03/28/2021      Assessment and Plan:  ER follow-up for CAP:  ER notes, labs and imaging reviewed She will continue Doxycycline until her course is finished Discussed if she had ongoing symptoms that would be pertinent to get the CTA of her chest  RTC in 6 months for your annual exam  Follow Up Instructions:    I discussed the assessment and treatment plan with the patient. The patient was provided an opportunity to ask questions and all were answered. The patient agreed with the plan and demonstrated an understanding of the instructions.   The patient was advised to call back or seek an in-person evaluation if the symptoms worsen or if the condition fails to improve as anticipated.   Webb Silversmith, NP

## 2022-01-17 NOTE — Assessment & Plan Note (Signed)
Advised her to start the atorvastatin Encouraged her to consume a low-fat diet

## 2022-01-17 NOTE — Assessment & Plan Note (Signed)
She will continue to follow with rheumatology

## 2022-01-20 NOTE — Telephone Encounter (Signed)
Sed rate was mildly elevated most likely due to infection.  High C4 is nonsignificant, low C4 indicates active autoimmune disease..  High beta-2 in the SPEP is due to inflammation most likely due to infection.  No further autoimmune workup is needed at this point.

## 2022-01-22 LAB — PROTEIN ELECTROPHORESIS, SERUM, WITH REFLEX
Albumin ELP: 4 g/dL (ref 3.8–4.8)
Alpha 1: 0.3 g/dL (ref 0.2–0.3)
Alpha 2: 0.7 g/dL (ref 0.5–0.9)
Beta 2: 0.6 g/dL — ABNORMAL HIGH (ref 0.2–0.5)
Beta Globulin: 0.5 g/dL (ref 0.4–0.6)
Gamma Globulin: 1.4 g/dL (ref 0.8–1.7)
Total Protein: 7.5 g/dL (ref 6.1–8.1)

## 2022-01-22 LAB — COMPLETE METABOLIC PANEL WITH GFR
AG Ratio: 1.3 (calc) (ref 1.0–2.5)
ALT: 15 U/L (ref 6–29)
AST: 14 U/L (ref 10–35)
Albumin: 4.3 g/dL (ref 3.6–5.1)
Alkaline phosphatase (APISO): 72 U/L (ref 37–153)
BUN: 15 mg/dL (ref 7–25)
CO2: 24 mmol/L (ref 20–32)
Calcium: 9.8 mg/dL (ref 8.6–10.4)
Chloride: 108 mmol/L (ref 98–110)
Creat: 0.79 mg/dL (ref 0.50–1.03)
Globulin: 3.4 g/dL (calc) (ref 1.9–3.7)
Glucose, Bld: 146 mg/dL — ABNORMAL HIGH (ref 65–99)
Potassium: 4 mmol/L (ref 3.5–5.3)
Sodium: 140 mmol/L (ref 135–146)
Total Bilirubin: 0.5 mg/dL (ref 0.2–1.2)
Total Protein: 7.7 g/dL (ref 6.1–8.1)
eGFR: 88 mL/min/{1.73_m2} (ref >=60)

## 2022-01-22 LAB — URINALYSIS, ROUTINE W REFLEX MICROSCOPIC
Bilirubin Urine: NEGATIVE
Glucose, UA: NEGATIVE
Hgb urine dipstick: NEGATIVE
Ketones, ur: NEGATIVE
Leukocytes,Ua: NEGATIVE
Nitrite: NEGATIVE
Protein, ur: NEGATIVE
Specific Gravity, Urine: 1.027 (ref 1.001–1.035)
pH: 5.5 (ref 5.0–8.0)

## 2022-01-22 LAB — CBC WITH DIFFERENTIAL/PLATELET
Absolute Monocytes: 291 cells/uL (ref 200–950)
Basophils Absolute: 28 cells/uL (ref 0–200)
Basophils Relative: 0.6 %
Eosinophils Absolute: 71 cells/uL (ref 15–500)
Eosinophils Relative: 1.5 %
HCT: 39.4 % (ref 35.0–45.0)
Hemoglobin: 13.4 g/dL (ref 11.7–15.5)
Lymphs Abs: 992 cells/uL (ref 850–3900)
MCH: 31.8 pg (ref 27.0–33.0)
MCHC: 34 g/dL (ref 32.0–36.0)
MCV: 93.4 fL (ref 80.0–100.0)
MPV: 11 fL (ref 7.5–12.5)
Monocytes Relative: 6.2 %
Neutro Abs: 3318 cells/uL (ref 1500–7800)
Neutrophils Relative %: 70.6 %
Platelets: 214 10*3/uL (ref 140–400)
RBC: 4.22 10*6/uL (ref 3.80–5.10)
RDW: 13 % (ref 11.0–15.0)
Total Lymphocyte: 21.1 %
WBC: 4.7 10*3/uL (ref 3.8–10.8)

## 2022-01-22 LAB — SEDIMENTATION RATE: Sed Rate: 31 mm/h — ABNORMAL HIGH (ref 0–30)

## 2022-01-22 LAB — C3 AND C4
C3 Complement: 186 mg/dL (ref 83–193)
C4 Complement: 70 mg/dL — ABNORMAL HIGH (ref 15–57)

## 2022-01-22 LAB — IFE INTERPRETATION: Immunofix Electr Int: NOT DETECTED

## 2022-01-22 NOTE — Progress Notes (Signed)
ESR is borderline elevated-31.  Less than 30 is normal.   C4 remains elevated-not of concern.  C3 WNL.   Glucose is elevated-146. Rest of CMP WNL. CBC WNL. UA normal.  IFE did not reveal any monoclonal proteins.  Please notify the patient.

## 2022-01-23 ENCOUNTER — Ambulatory Visit: Payer: 59 | Admitting: Internal Medicine

## 2022-01-31 ENCOUNTER — Ambulatory Visit: Payer: 59 | Admitting: Physician Assistant

## 2022-02-05 NOTE — Telephone Encounter (Signed)
Please schedule follow up visit to assess left knee.   We can also schedule an ultrasound of her hands to assess for synovitis.

## 2022-02-10 NOTE — Progress Notes (Unsigned)
Office Visit Note  Patient: [name]             Date of Birth: 1966-03-24           MRN: [id number]             PCP: Jearld Fenton, NP Referring: Jearld Fenton, NP Visit Date: 02/12/2022 Occupation: '@GUAROCC'$ @  Subjective:  Other (Patient reports recent scalp sensitivity and throbbing on right side of head)   History of Present Illness: Jordie Skalsky is a 56 y.o. female with history of Sjogren's, osteoarthritis, degenerative disc disease and fibromyalgia syndrome.  She states for the last 2 weeks she has been having increased pain and discomfort in her joints.  She has been having pain and discomfort in her bilateral hands and bilateral wrist joints.  She is having pain in her bilateral knee joints.  She is having difficulty holding objects and also walking due to right knee joint pain.  She had a chest x-ray on January 16, 2022 due to shortness of breath.  At the time she had a minimal patchy left lung base opacity.  She was treated with antibiotics by her PCP.  She states the symptoms are improving.  Activities of Daily Living:  Patient reports morning stiffness for all day. Patient Reports nocturnal pain.  Difficulty dressing/grooming: Reports Difficulty climbing stairs: Reports Difficulty getting out of chair: Reports Difficulty using hands for taps, buttons, cutlery, and/or writing: Reports  Review of Systems  Constitutional:  Positive for fatigue.  HENT:  Positive for mouth dryness. Negative for mouth sores.   Eyes:  Positive for dryness.  Respiratory:  Positive for cough, shortness of breath and wheezing.   Cardiovascular:  Positive for palpitations. Negative for chest pain.  Gastrointestinal:  Positive for constipation. Negative for blood in stool and diarrhea.  Endocrine: Negative for increased urination.  Genitourinary:  Positive for involuntary urination.  Musculoskeletal:  Positive for joint pain, gait problem, joint pain, joint swelling, myalgias,  muscle weakness, morning stiffness, muscle tenderness and myalgias.  Skin:  Negative for color change, rash, hair loss and sensitivity to sunlight.  Allergic/Immunologic: Negative for susceptible to infections.  Neurological:  Negative for dizziness and headaches.  Hematological:  Negative for swollen glands.  Psychiatric/Behavioral:  Positive for sleep disturbance. Negative for depressed mood. The patient is not nervous/anxious.     PMFS History:  Patient Active Problem List   Diagnosis Date Noted   Primary osteoarthritis of both hands 10/31/2021   Fibromyalgia 10/24/2021   Sjogren's syndrome ([place]) 10/24/2021   DDD (degenerative disc disease), lumbar 10/14/2021   Insomnia 02/14/2021   Prediabetes 12/31/2020   HLD (hyperlipidemia) 12/31/2020   Frequent headaches 12/31/2020   Morbid obesity ([place]) 12/31/2020   Gastroesophageal reflux disease without esophagitis 11/15/2018   Anxiety and depression 08/03/2014    Past Medical History:  Diagnosis Date   Allergy    DDD (degenerative disc disease), lumbar    Depression    Pneumonia     Family History  Problem Relation Age of Onset   Hyperlipidemia Mother    Rheum arthritis Sister    Osteoarthritis Sister    Fibroids Sister    Fibroids Sister    Fibroids Sister    Heart disease Maternal Aunt    Stroke Maternal Aunt    Diabetes Maternal Aunt    Heart disease Maternal Uncle    Stroke Maternal Uncle    Rheum arthritis Nephew    Cancer Neg Hx    Past  Surgical History:  Procedure Laterality Date   ABDOMINAL HYSTERECTOMY  2008   LAVH, endometriosis ablation, mccall culdoplasty   CHOLECYSTECTOMY N/A 08/31/2019   Procedure: LAPAROSCOPIC CHOLECYSTECTOMY;  Surgeon: Olean Ree, MD;  Location: [facility] ORS;  Service: General;  Laterality: N/A;   DIAGNOSTIC LAPAROSCOPY  2002   Hysteroscopy, diagnostic laparoscopy, extensive lysis of adhesions, endometrial ablation, RSO   RIGHT OOPHORECTOMY  2007   with right salpingectomy   TUBAL  LIGATION     Social History   Social History Narrative   Not on file   Immunization History  Administered Date(s) Administered   Influenza,inj,Quad PF,6+ Mos 03/17/2015, 03/17/2017, 02/25/2019, 03/19/2020, 03/28/2021   PFIZER(Purple Top)SARS-COV-2 Vaccination 07/11/2019, 08/01/2019   Tdap 02/09/2007, 09/18/2016     Objective: Vital Signs: BP 121/78 (BP Location: Left Arm, Patient Position: Sitting, Cuff Size: Large)   Pulse 91   Resp 18   Ht '5\' 5"'$  (1.651 m)   Wt 275 lb 9.6 oz (125 kg)   BMI 45.86 kg/m    Physical Exam Vitals and nursing note reviewed.  Constitutional:      Appearance: She is well-developed.  HENT:     Head: Normocephalic and atraumatic.  Eyes:     Conjunctiva/sclera: Conjunctivae normal.  Cardiovascular:     Rate and Rhythm: Normal rate and regular rhythm.     Heart sounds: Normal heart sounds.  Pulmonary:     Effort: Pulmonary effort is normal.     Breath sounds: Normal breath sounds.  Abdominal:     General: Bowel sounds are normal.     Palpations: Abdomen is soft.  Musculoskeletal:     Cervical back: Normal range of motion.  Lymphadenopathy:     Cervical: No cervical adenopathy.  Skin:    General: Skin is warm and dry.     Capillary Refill: Capillary refill takes less than 2 seconds.  Neurological:     Mental Status: She is alert and oriented to person, place, and time.  Psychiatric:        Behavior: Behavior normal.      Musculoskeletal Exam: C-spine was in good range of motion.  Shoulder joints, elbow joints, wrist joints with good range of motion.  She had tenderness over bilateral wrist joints, MCPs and PIPs.  No synovitis was noted.  Hip joints with good range of motion.  She had painful range of motion of her left knee joint without any warmth swelling or effusion.  There was no tenderness over ankles or MTPs.  She had generalized hyperalgesia and positive tender points.  CDAI Exam: CDAI Score: -- Patient Global: --; Provider Global:  -- Swollen: --; Tender: -- Joint Exam 02/12/2022   No joint exam has been documented for this visit   There is currently no information documented on the homunculus. Go to the Rheumatology activity and complete the homunculus joint exam.  Investigation: No additional findings.  Imaging: [place] COMPLETE JOINT SPACE STRUCTURES UP BILAT  Result Date: 02/12/2022 Ultrasound examination of bilateral hands was performed per EULAR recommendations. Using 12 MHz transducer, grayscale and power Doppler bilateral second, third, and fifth MCP joints and bilateral wrist joints both dorsal and volar aspects were evaluated to look for synovitis or tenosynovitis. The findings were there was  synovitis in bilateral second and third and fifth MCP joints and bilateral wrist joints on ultrasound examination. Right median nerve was 0.03 cm squares which was within normal limits and left median nerve was 0.05 cm squares which was within normal limits. Impression: Ultrasound examination  showed synovitis in bilateral MCP joints and wrist joints which was consistent with inflammatory arthritis.  Bilateral median nerves are within normal limits.  DG Chest 2 View  Result Date: 01/16/2022 CLINICAL DATA:  Left-sided chest pain. EXAM: CHEST - 2 VIEW COMPARISON:  03/11/2016 FINDINGS: The heart is normal in size.The cardiomediastinal contours are normal. Minimal patchy left lung base opacity. Pulmonary vasculature is normal. No pleural effusion or pneumothorax. No acute osseous abnormalities are seen. IMPRESSION: Minimal patchy left lung base opacity, atelectasis versus pneumonia. Electronically Signed   By: Keith Rake M.D.   On: 01/16/2022 00:21    Recent Labs: Lab Results  Component Value Date   WBC 5.5 01/15/2022   HGB 12.3 01/15/2022   PLT 227 01/15/2022   NA 141 01/15/2022   K 4.0 01/15/2022   CL 113 (H) 01/15/2022   CO2 19 (L) 01/15/2022   GLUCOSE 140 (H) 01/15/2022   BUN 16 01/15/2022   CREATININE 0.80  01/15/2022   BILITOT 0.5 01/15/2022   ALKPHOS 62 03/19/2020   AST 14 01/15/2022   ALT 15 01/15/2022   PROT 7.7 01/15/2022   PROT 7.5 01/15/2022   ALBUMIN 3.9 03/19/2020   CALCIUM 9.7 01/15/2022   GFRAA >60 12/01/2019   January 15, 2022 UA negative, SPEP normal, C3-C4 normal, sed rate 31, IFE normal  Speciality Comments: No specialty comments available.  Procedures:  Large Joint Inj: L knee on 02/12/2022 3:21 PM Indications: pain Details: 27 G 1.5 in needle, medial approach  Arthrogram: No  Medications: 40 mg triamcinolone acetonide 40 MG/ML; 1.5 mL lidocaine 1 % Aspirate: 0 mL Outcome: tolerated well, no immediate complications Procedure, treatment alternatives, risks and benefits explained, specific risks discussed. Consent was given by the patient. Immediately prior to procedure a time out was called to verify the correct patient, procedure, equipment, support staff and site/side marked as required. Patient was prepped and draped in the usual sterile fashion.     Allergies: Gabapentin, Hydrochlorothiazide, Lipitor [atorvastatin], Penicillins, and Orange juice [orange oil]   Assessment / Plan:     Visit Diagnoses: Sjogren's syndrome with keratoconjunctivitis sicca (HCC) - Positive ANA, positive SSA, sicca symptoms, arthralgias: She gives history of severe pain and discomfort in her joints over the last 2 months.  She is having difficulty making a fist and doing routine activities.  She is having difficulty walking due to pain and discomfort in her left knee joint.  No synovitis was noted on the examination.  Ultrasound of bilateral hands was performed at the office which showed synovitis in bilateral second third and fifth MCP joints and her wrist joints.  Bilateral median nerves are within normal limits.  She has been experiencing severe discomfort.  Different treatment options and their side effects were discussed.  I will give her prednisone as a bridging therapy starting at 20 mg  p.o. daily and taper by 5 mg every 4 days.  Side effects of prednisone including hypertension, elevated blood glucose, obesity, osteoporosis and increased risk of heart disease was discussed.  The plan is to start her on methotrexate.  Indications side effects contraindications were discussed at length.  Handout was given and consent was taken.  Patient wants to proceed with methotrexate.  I will obtain labs today.  Once the labs are available we will start her on methotrexate 6 tablets p.o. weekly along with folic acid 2 mg p.o. daily.  Dose of methotrexate will be increased to 8 tablets p.o. weekly after the lab results are available.  We will check labs 2 weeks x 2 then 2 months and then every 3 months to monitor for drug toxicity.  Drug Counseling TB Gold: Pending Hepatitis panel: Hepatitis B pending, hepatitis C was negative on March 28, 2021.  Chest-xray: August 2, 2023Minimal patchy left lung base opacity was noted.  Patient was treated with antibiotics for possible pneumonia.  Contraception: Hysterectomy  Alcohol use: Occasional  Patient was counseled on the purpose, proper use, and adverse effects of methotrexate including nausea, infection, and signs and symptoms of pneumonitis.  Reviewed instructions with patient to take methotrexate weekly along with folic acid daily.  Discussed the importance of frequent monitoring of kidney and liver function and blood counts, and provided patient with standing lab instructions.  Counseled patient to avoid NSAIDs and alcohol while on methotrexate.  Provided patient with educational materials on methotrexate and answered all questions.  Advised patient to get annual influenza vaccine and to get a pneumococcal vaccine if patient has not already had one.  Patient voiced understanding.  Patient consented to methotrexate use.  Will upload into chart.     Inflammatory arthritis-patient has pain and discomfort in multiple joints.  She had tenderness over MCPs,  PIPs and left knee joint.  Ultrasound was consistent with inflammatory arthritis.  High risk medication use-patient will be starting methotrexate at 6 tablets p.o. weekly and will increase it to 8 tablets p.o. weekly if the labs are normal.  She will get labs every 2 weeks x 2, 2 months and then every 3 months.  She will be taking folic acid 2 mg p.o. daily.  Information on immunization was placed in the AVS.  She was also advised to get repeat chest x-ray as she had recent pneumonia.  She was advised to hold methotrexate if she develops an infection and resume after the infection resolves.  Primary osteoarthritis of both hands -she has history of osteoarthritis in her hands.  She presented today with increased pain and discomfort in her bilateral hands.  No synovitis was noted on the examination.  Ultrasound examination was consistent with synovitis.. - Plan: [place] COMPLETE JOINT SPACE STRUCTURES UP BILAT  Paresthesia of both hands - Plan: [place] COMPLETE JOINT SPACE STRUCTURES UP BILAT.  Bilateral median nerves are within normal limits.  Trochanteric bursitis of both hips-I advised her to continue with the stretching exercises.  Chronic pain of both knees-been having severe pain and discomfort in the left knee joint.  As she was having difficulty walking.  She did not have warmth or effusion in her knee joint.  After informed consent was obtained left knee joint was injected with lidocaine and Kenalog as described above.  She tolerated the procedure well.  Postprocedure instructions were given.  DDD (degenerative disc disease), lumbar-she is currently not having any back pain.  Fibromyalgia-she continues to have generalized pain and discomfort.  She has been taking Cymbalta which has been helpful.  Anxiety and depression - Cymbalta 30 mg 1 capsule by mouth daily.  She has noticed improvement in her depression and anxiety.  Other medical problems are listed as follows:  Adjustment  insomnia  Hypermobility of joint  Pure hypertriglyceridemia  Prediabetes  Morbid obesity (HCC)  Frequent headaches  Gastroesophageal reflux disease without esophagitis  Seasonal allergies  Family history of rheumatoid arthritis- two sisters and nephew  Orders: Orders Placed This Encounter  Procedures   Large Joint Inj   [place] COMPLETE JOINT SPACE STRUCTURES UP BILAT   DG Chest 2 View   Hepatitis  B core antibody, IgM   Hepatitis B surface antigen   QuantiFERON-TB Gold Plus   COMPLETE METABOLIC PANEL WITH GFR   No orders of the defined types were placed in this encounter.   Follow-Up Instructions: Return in about 6 weeks (around 03/26/2022) for Sjogren's, Osteoarthritis.   Bo Merino, MD  Note - This record has been created using Editor, commissioning.  Chart creation errors have been sought, but may not always  have been located. Such creation errors do not reflect on  the standard of medical care.

## 2022-02-12 ENCOUNTER — Ambulatory Visit: Payer: Self-pay

## 2022-02-12 ENCOUNTER — Telehealth: Payer: Self-pay

## 2022-02-12 ENCOUNTER — Ambulatory Visit: Payer: Self-pay | Attending: Rheumatology | Admitting: Rheumatology

## 2022-02-12 ENCOUNTER — Encounter: Payer: Self-pay | Admitting: Rheumatology

## 2022-02-12 VITALS — BP 121/78 | HR 91 | Resp 18 | Ht 65.0 in | Wt 275.6 lb

## 2022-02-12 DIAGNOSIS — M19041 Primary osteoarthritis, right hand: Secondary | ICD-10-CM

## 2022-02-12 DIAGNOSIS — F419 Anxiety disorder, unspecified: Secondary | ICD-10-CM

## 2022-02-12 DIAGNOSIS — G8929 Other chronic pain: Secondary | ICD-10-CM

## 2022-02-12 DIAGNOSIS — R7303 Prediabetes: Secondary | ICD-10-CM

## 2022-02-12 DIAGNOSIS — M249 Joint derangement, unspecified: Secondary | ICD-10-CM

## 2022-02-12 DIAGNOSIS — F5102 Adjustment insomnia: Secondary | ICD-10-CM

## 2022-02-12 DIAGNOSIS — M7061 Trochanteric bursitis, right hip: Secondary | ICD-10-CM

## 2022-02-12 DIAGNOSIS — M3501 Sicca syndrome with keratoconjunctivitis: Secondary | ICD-10-CM

## 2022-02-12 DIAGNOSIS — R519 Headache, unspecified: Secondary | ICD-10-CM

## 2022-02-12 DIAGNOSIS — M25561 Pain in right knee: Secondary | ICD-10-CM

## 2022-02-12 DIAGNOSIS — Z8261 Family history of arthritis: Secondary | ICD-10-CM

## 2022-02-12 DIAGNOSIS — R202 Paresthesia of skin: Secondary | ICD-10-CM

## 2022-02-12 DIAGNOSIS — Z79899 Other long term (current) drug therapy: Secondary | ICD-10-CM

## 2022-02-12 DIAGNOSIS — J302 Other seasonal allergic rhinitis: Secondary | ICD-10-CM

## 2022-02-12 DIAGNOSIS — E781 Pure hyperglyceridemia: Secondary | ICD-10-CM

## 2022-02-12 DIAGNOSIS — M797 Fibromyalgia: Secondary | ICD-10-CM

## 2022-02-12 DIAGNOSIS — M1990 Unspecified osteoarthritis, unspecified site: Secondary | ICD-10-CM

## 2022-02-12 DIAGNOSIS — M19042 Primary osteoarthritis, left hand: Secondary | ICD-10-CM

## 2022-02-12 DIAGNOSIS — M5136 Other intervertebral disc degeneration, lumbar region: Secondary | ICD-10-CM

## 2022-02-12 DIAGNOSIS — M7062 Trochanteric bursitis, left hip: Secondary | ICD-10-CM

## 2022-02-12 DIAGNOSIS — M25562 Pain in left knee: Secondary | ICD-10-CM

## 2022-02-12 DIAGNOSIS — K219 Gastro-esophageal reflux disease without esophagitis: Secondary | ICD-10-CM

## 2022-02-12 DIAGNOSIS — F32A Depression, unspecified: Secondary | ICD-10-CM

## 2022-02-12 MED ORDER — PREDNISONE 5 MG PO TABS: ORAL_TABLET | ORAL | 0 refills | Status: DC

## 2022-02-12 MED ORDER — LIDOCAINE HCL 1 % IJ SOLN
1.5000 mL | INTRAMUSCULAR | Status: AC | PRN
  Administered 2022-02-12: 1.5 mL

## 2022-02-12 MED ORDER — TRIAMCINOLONE ACETONIDE 40 MG/ML IJ SUSP
40.0000 mg | INTRAMUSCULAR | Status: AC | PRN
  Administered 2022-02-12: 40 mg via INTRA_ARTICULAR

## 2022-02-12 NOTE — Patient Instructions (Signed)
Methotrexate Tablets What is this medication? METHOTREXATE (METH oh TREX ate) treats inflammatory conditions such as arthritis and psoriasis. It works by decreasing inflammation, which can reduce pain and prevent long-term injury to the joints and skin. It may also be used to treat some types of cancer. It works by slowing down the growth of cancer cells. This medicine may be used for other purposes; ask your health care provider or pharmacist if you have questions. COMMON BRAND NAME(S): Rheumatrex, Trexall What should I tell my care team before I take this medication? They need to know if you have any of these conditions: Fluid in the stomach area or lungs If you often drink alcohol Infection or immune system problems Kidney disease or on hemodialysis Liver disease Low blood counts, like low white cell, platelet, or red cell counts Lung disease Radiation therapy Stomach ulcers Ulcerative colitis An unusual or allergic reaction to methotrexate, other medications, foods, dyes, or preservatives Pregnant or trying to get pregnant Breast-feeding How should I use this medication? Take this medication by mouth with a glass of water. Follow the directions on the prescription label. Take your medication at regular intervals. Do not take it more often than directed. Do not stop taking except on your care team's advice. Make sure you know why you are taking this medication and how often you should take it. If this medication is used for a condition that is not cancer, like arthritis or psoriasis, it should be taken weekly, NOT daily. Taking this medication more often than directed can cause serious side effects, even death. Talk to your care team about safe handling and disposal of this medication. You may need to take special precautions. Talk to your care team about the use of this medication in children. While this medication may be prescribed for selected conditions, precautions do  apply. Overdosage: If you think you have taken too much of this medicine contact a poison control center or emergency room at once. NOTE: This medicine is only for you. Do not share this medicine with others. What if I miss a dose? If you miss a dose, talk with your care team. Do not take double or extra doses. What may interact with this medication? Do not take this medication with any of the following: Acitretin This medication may also interact with the following: Aspirin and aspirin-like medications including salicylates Azathioprine Certain antibiotics like penicillins, tetracycline, and chloramphenicol Certain medications that treat or prevent blood clots like warfarin, apixaban, dabigatran, and rivaroxaban Certain medications for stomach problems like esomeprazole, omeprazole, pantoprazole Cyclosporine Dapsone Diuretics Gold Hydroxychloroquine Live virus vaccines Medications for infection like acyclovir, adefovir, amphotericin B, bacitracin, cidofovir, foscarnet, ganciclovir, gentamicin, pentamidine, vancomycin Mercaptopurine NSAIDs, medications for pain and inflammation, like ibuprofen or naproxen Other cytotoxic agents Pamidronate Pemetrexed Penicillamine Phenylbutazone Phenytoin Probenecid Pyrimethamine Retinoids such as isotretinoin and tretinoin Steroid medications like prednisone or cortisone Sulfonamides like sulfasalazine and trimethoprim/sulfamethoxazole Theophylline Zoledronic acid This list may not describe all possible interactions. Give your health care provider a list of all the medicines, herbs, non-prescription drugs, or dietary supplements you use. Also tell them if you smoke, drink alcohol, or use illegal drugs. Some items may interact with your medicine. What should I watch for while using this medication? Avoid alcoholic drinks. This medication can make you more sensitive to the sun. Keep out of the sun. If you cannot avoid being in the sun, wear  protective clothing and use sunscreen. Do not use sun lamps or tanning beds/booths. You may need   blood work done while you are taking this medication. Call your care team for advice if you get a fever, chills or sore throat, or other symptoms of a cold or flu. Do not treat yourself. This medication decreases your body's ability to fight infections. Try to avoid being around people who are sick. This medication may increase your risk to bruise or bleed. Call your care team if you notice any unusual bleeding. Be careful brushing or flossing your teeth or using a toothpick because you may get an infection or bleed more easily. If you have any dental work done, tell your dentist you are receiving this medication. Check with your care team if you get an attack of severe diarrhea, nausea and vomiting, or if you sweat a lot. The loss of too much body fluid can make it dangerous for you to take this medication. Talk to your care team about your risk of cancer. You may be more at risk for certain types of cancers if you take this medication. Do not become pregnant while taking this medication or for 6 months after stopping it. Women should inform their care team if they wish to become pregnant or think they might be pregnant. Men should not father a child while taking this medication and for 3 months after stopping it. There is potential for serious harm to an unborn child. Talk to your care team for more information. Do not breast-feed an infant while taking this medication or for 1 week after stopping it. This medication may make it more difficult to get pregnant or father a child. Talk to your care team if you are concerned about your fertility. What side effects may I notice from receiving this medication? Side effects that you should report to your care team as soon as possible: Allergic reactions--skin rash, itching, hives, swelling of the face, lips, tongue, or throat Blood clot--pain, swelling, or warmth  in the leg, shortness of breath, chest pain Dry cough, shortness of breath or trouble breathing Infection--fever, chills, cough, sore throat, wounds that don't heal, pain or trouble when passing urine, general feeling of discomfort or being unwell Kidney injury--decrease in the amount of urine, swelling of the ankles, hands, or feet Liver injury--right upper belly pain, loss of appetite, nausea, light-colored stool, dark yellow or brown urine, yellowing of the skin or eyes, unusual weakness or fatigue Low red blood cell count--unusual weakness or fatigue, dizziness, headache, trouble breathing Redness, blistering, peeling, or loosening of the skin, including inside the mouth Seizures Unusual bruising or bleeding Side effects that usually do not require medical attention (report to your care team if they continue or are bothersome): Diarrhea Dizziness Hair loss Nausea Pain, redness, or swelling with sores inside the mouth or throat Vomiting This list may not describe all possible side effects. Call your doctor for medical advice about side effects. You may report side effects to FDA at 1-800-FDA-1088. Where should I keep my medication? Keep out of the reach of children and pets. Store at room temperature between 20 and 25 degrees C (68 and 77 degrees F). Protect from light. Get rid of any unused medication after the expiration date. Talk to your care team about how to dispose of unused medication. Special directions may apply. NOTE: This sheet is a summary. It may not cover all possible information. If you have questions about this medicine, talk to your doctor, pharmacist, or health care provider.  2023 Elsevier/Gold Standard (2007-07-24 00:00:00)  Standing Labs We placed an order  today for your standing lab work.   Please have your standing labs drawn in 2 weeks x 2, 2 months and then every 3 months.   If possible, please have your labs drawn 2 weeks prior to your appointment so that  the provider can discuss your results at your appointment.  Please note that you may see your imaging and lab results in [facility] before we have reviewed them. We may be awaiting multiple results to interpret others before contacting you. Please allow our office up to 72 hours to thoroughly review all of the results before contacting the office for clarification of your results.  We currently have open lab daily: Monday through Thursday from 1:30 PM-4:30 PM and Friday from 1:30 PM- 4:00 PM If possible, please come for your lab work on Monday, Thursday or Friday afternoons, as you may experience shorter wait times.   Effective April 16, 2022 the new lab hours will change to: Monday through Thursday from 1:30 PM-5:00 PM and Friday from 8:30 AM-12:00 PM If possible, please come for your lab work on Monday and Thursday afternoons, as you may experience shorter wait times.  Please be advised, all patients with office appointments requiring lab work will take precedent over walk-in lab work.    The office is located at [address], [place], [place], [place] [address] No appointment is necessary.   Labs are drawn by [facility]. Please bring your co-pay at the time of your lab draw.  You may receive a bill from [facility] for your lab work.  Please note if you are on Hydroxychloroquine and and an order has been placed for a Hydroxychloroquine level, you will need to have it drawn 4 hours or more after your last dose.  If you wish to have your labs drawn at another location, please call the office 24 hours in advance to send orders.  If you have any questions regarding directions or hours of operation,  please call [phone].   As a reminder, please drink plenty of water prior to coming for your lab work. Thanks!   Vaccines You are taking a medication(s) that can suppress your immune system.  The following immunizations are recommended: Flu annually Covid-19  Td/Tdap (tetanus, diphtheria,  pertussis) every 10 years Pneumonia (Prevnar 15 then Pneumovax 23 at least 1 year apart.  Alternatively, can take Prevnar 20 without needing additional dose) Shingrix: 2 doses from 4 weeks to 6 months apart  Please check with your PCP to make sure you are up to date.   If you have signs or symptoms of an infection or start antibiotics: First, call your PCP for workup of your infection. Hold your medication through the infection, until you complete your antibiotics, and until symptoms resolve if you take the following: Injectable medication (Actemra, Benlysta, Cimzia, Cosentyx, Enbrel, Humira, Kevzara, Orencia, Remicade, Simponi, Stelara, Taltz, Tremfya) Methotrexate Leflunomide (Arava) Mycophenolate (Cellcept) Morrie Sheldon, Olumiant, or Rinvoq

## 2022-02-12 NOTE — Telephone Encounter (Signed)
Patient will be updated chest x-ray and labs on Friday, 02/14/2022 when her insurance is effective. Pending those results, patient will be starting methotrexate tablets and folic acid per Dr. Estanislado Pandy. Thanks!   Consent obtained and sent to the scan center.

## 2022-02-13 ENCOUNTER — Encounter: Payer: Self-pay | Admitting: Internal Medicine

## 2022-02-13 MED ORDER — TRAMADOL HCL 50 MG PO TABS: 50.0000 mg | ORAL_TABLET | Freq: Every day | ORAL | 0 refills | Status: AC | PRN

## 2022-02-21 NOTE — Telephone Encounter (Signed)
Spoke with patient and she states that she is awaiting her policy number so she may move forward in getting the necessary testing prior to starting MTX. Patient will send a my chart message to advise when she has had the chest x-ray and she will come the office for the lab work.

## 2022-02-24 ENCOUNTER — Encounter: Payer: Self-pay | Admitting: Rheumatology

## 2022-02-24 NOTE — Telephone Encounter (Signed)
Dr. Estanislado Pandy recommended the use of methotrexate due to synovitis noted on her recent ultrasound. Plaquenil is considered a milder medication with less side effects so she could try that first if she is not ready to proceed with methotrexate at this time.  Plaquenil would likely not be as effective if methotrexate would be so please make the patient aware of this.  She will require a return visit or virtual visit to discuss indications, contraindications, potential side effects of Plaquenil in detail.  Consent will need to be obtained prior to initiating Plaquenil.   Please advise her to still proceed with the CXR.    Ok to place referral to orthopedics.

## 2022-02-27 ENCOUNTER — Emergency Department: Payer: Commercial Managed Care - HMO

## 2022-02-27 ENCOUNTER — Emergency Department
Admission: EM | Admit: 2022-02-27 | Discharge: 2022-02-27 | Discharge status: Against Medical Advice | Payer: Commercial Managed Care - HMO | Attending: Emergency Medicine | Admitting: Emergency Medicine

## 2022-02-27 ENCOUNTER — Other Ambulatory Visit: Payer: Self-pay

## 2022-02-27 DIAGNOSIS — Z5321 Procedure and treatment not carried out due to patient leaving prior to being seen by health care provider: Secondary | ICD-10-CM | POA: Insufficient documentation

## 2022-02-27 DIAGNOSIS — R059 Cough, unspecified: Secondary | ICD-10-CM | POA: Insufficient documentation

## 2022-02-27 NOTE — ED Notes (Signed)
This RN at front desk when patient checked in with co-worker. Patient and co worker report leaving work due to chest pressure and coming to ER. Patient reported her doctor wanting her to have follow up chest xray. This RN explained to patient if she had order for chest xray she [facility] and have xray there or can always check into Emergency Room if needed for chest pain and be seen by doctor. Patient gave information to registration and checked into ER. After having chest xray, patient reported she only needed the chest X-ray and wanted to leave, not be seen in ER as patient.

## 2022-02-27 NOTE — ED Triage Notes (Signed)
Pt states she is here to get a follow up chest xray per her rheumatologist- pt had pneumonia a month ago and was instructed to get a repeat chest xray- pt does state that she is still having a productive cough

## 2022-02-28 NOTE — Telephone Encounter (Signed)
See my chart message from 02/24/2022.

## 2022-03-12 NOTE — Progress Notes (Unsigned)
Office Visit Note  Patient: [name]             Date of Birth: 1966/05/14           MRN: [id number]             PCP: Jearld Fenton, NP Referring: Jearld Fenton, NP Visit Date: 03/26/2022 Occupation: '@GUAROCC'$ @  Subjective:  Discuss starting methotrexate   History of Present Illness: [name] is a 56 y.o. female with history of sjogren's syndrome, inflammatory arthritis, and osteoarthritis.  Patient had ultrasound of both hands performed on 02/12/2022 which was positive for synovitis.  The use of methotrexate for inflammatory arthritis was discussed at her last office visit on 02/12/22.  She was given a prednisone taper starting 20 mg tapering by 5 mg every 4 days and had a left knee joint cortisone injection on 02/12/2022.  She did not notice any improvement in her left knee joint pain after having the cortisone injection performed.  She has been following up closely at [facility] for left knee pain.  She has been experiencing mechanical symptoms and instability while walking.  She has been using a cane to assist with ambulation.  She is scheduled to start physical therapy on 04/16/2022.  She has tried home exercises with no improvement in her symptoms.  She is waiting for an MRI to be approved for the left knee for further evaluation. She was switched from celebrex and skelakin to meloxicam and robaxin by orthopedics, which has been helpful.  She is not currently taking cymbalta.   She is apprehensive to start on methotrexate due to the concern for possible side effects.  She is hesitant to suppress her immune system since her symptoms have improved since initiating meloxicam.  She continues to have pain in both hands but overall the stiffness and burning in her fingers has improved.   Activities of Daily Living:  Patient reports morning stiffness for 15 minutes.   Patient Reports nocturnal pain.  Difficulty dressing/grooming: Reports Difficulty climbing  stairs: Reports Difficulty getting out of chair: Reports Difficulty using hands for taps, buttons, cutlery, and/or writing: Reports  Review of Systems  Constitutional:  Positive for fatigue.  HENT:  Positive for mouth dryness. Negative for mouth sores.        Nose sores  Eyes:  Positive for dryness.  Respiratory:  Positive for shortness of breath.   Cardiovascular:  Negative for chest pain and palpitations.  Gastrointestinal:  Negative for blood in stool, constipation and diarrhea.  Endocrine: Negative for increased urination.  Genitourinary:  Negative for involuntary urination.  Musculoskeletal:  Positive for joint pain, gait problem, joint pain, myalgias, muscle weakness, morning stiffness, muscle tenderness and myalgias. Negative for joint swelling.  Skin:  Negative for color change, rash, hair loss and sensitivity to sunlight.  Allergic/Immunologic: Negative for susceptible to infections.  Neurological:  Positive for headaches. Negative for dizziness.  Hematological:  Negative for swollen glands.  Psychiatric/Behavioral:  Positive for sleep disturbance. Negative for depressed mood. The patient is not nervous/anxious.     PMFS History:  Patient Active Problem List   Diagnosis Date Noted  . Primary osteoarthritis of both hands 10/31/2021  . Fibromyalgia 10/24/2021  . Sjogren's syndrome ([place]) 10/24/2021  . DDD (degenerative disc disease), lumbar 10/14/2021  . Insomnia 02/14/2021  . Prediabetes 12/31/2020  . HLD (hyperlipidemia) 12/31/2020  . Frequent headaches 12/31/2020  . Morbid obesity ([place]) 12/31/2020  . Gastroesophageal reflux disease without esophagitis 11/15/2018  .  Anxiety and depression 08/03/2014    Past Medical History:  Diagnosis Date  . Allergy   . DDD (degenerative disc disease), lumbar   . Depression   . Pneumonia     Family History  Problem Relation Age of Onset  . Hyperlipidemia Mother   . Rheum arthritis Sister   . Osteoarthritis Sister   . Fibroids  Sister   . Fibroids Sister   . Fibroids Sister   . Heart disease Maternal Aunt   . Stroke Maternal Aunt   . Diabetes Maternal Aunt   . Heart disease Maternal Uncle   . Stroke Maternal Uncle   . Rheum arthritis Nephew   . Cancer Neg Hx    Past Surgical History:  Procedure Laterality Date  . ABDOMINAL HYSTERECTOMY  2008   LAVH, endometriosis ablation, mccall culdoplasty  . CHOLECYSTECTOMY N/A 08/31/2019   Procedure: LAPAROSCOPIC CHOLECYSTECTOMY;  Surgeon: Olean Ree, MD;  Location: [facility];  Service: General;  Laterality: N/A;  . DIAGNOSTIC LAPAROSCOPY  2002   Hysteroscopy, diagnostic laparoscopy, extensive lysis of adhesions, endometrial ablation, RSO  . RIGHT OOPHORECTOMY  2007   with right salpingectomy  . TUBAL LIGATION     Social History   Social History Narrative  . Not on file   Immunization History  Administered Date(s) Administered  . Influenza,inj,Quad PF,6+ Mos 03/17/2015, 03/17/2017, 02/25/2019, 03/19/2020, 03/28/2021  . PFIZER(Purple Top)SARS-COV-2 Vaccination 07/11/2019, 08/01/2019  . Tdap 02/09/2007, 09/18/2016     Objective: Vital Signs: BP 124/88 (BP Location: Left Arm, Patient Position: Sitting, Cuff Size: Large)   Pulse 88   Resp 18   Ht '5\' 5"'$  (1.651 m)   Wt 274 lb (124.3 kg)   BMI 45.60 kg/m    Physical Exam Vitals and nursing note reviewed.  Constitutional:      Appearance: She is well-developed.  HENT:     Head: Normocephalic and atraumatic.  Eyes:     Conjunctiva/sclera: Conjunctivae normal.  Cardiovascular:     Rate and Rhythm: Normal rate and regular rhythm.     Heart sounds: Normal heart sounds.  Pulmonary:     Effort: Pulmonary effort is normal.     Breath sounds: Normal breath sounds.  Abdominal:     General: Bowel sounds are normal.     Palpations: Abdomen is soft.  Musculoskeletal:     Cervical back: Normal range of motion.  Skin:    General: Skin is warm and dry.     Capillary Refill: Capillary refill takes less than 2  seconds.  Neurological:     Mental Status: She is alert and oriented to person, place, and time.  Psychiatric:        Behavior: Behavior normal.     Musculoskeletal Exam: Generalized hyperalgesia.  C-spine has good range of motion.  Trapezius muscle tension tenderness bilaterally.  Shoulder joints and elbow joints have good range of motion with no discomfort.  Tenderness over both wrist joints.  Tenderness of all MCP and PIP joints but no obvious synovitis was noted.  Complete fist formation bilaterally.  Hip joints have good range of motion with no groin pain.  Painful range of motion of the left knee joint.  Ankle joints have good range of motion with no tenderness or synovitis.  CDAI Exam: CDAI Score: -- Patient Global: 5 mm; Provider Global: 5 mm Swollen: --; Tender: -- Joint Exam 03/26/2022   No joint exam has been documented for this visit   There is currently no information documented on the homunculus. Go  to the Rheumatology activity and complete the homunculus joint exam.  Investigation: No additional findings.  Imaging: DG Chest 2 View  Result Date: 02/27/2022 CLINICAL DATA:  Pneumonia follow-up EXAM: CHEST - 2 VIEW COMPARISON:  01/16/2022 FINDINGS: No focal consolidation. No pleural effusion or pneumothorax. Heart and mediastinal contours are unremarkable. No acute osseous abnormality. IMPRESSION: No active cardiopulmonary disease. Electronically Signed   By: Kathreen Devoid M.D.   On: 02/27/2022 10:57    Recent Labs: Lab Results  Component Value Date   WBC 5.5 01/15/2022   HGB 12.3 01/15/2022   PLT 227 01/15/2022   NA 141 01/15/2022   K 4.0 01/15/2022   CL 113 (H) 01/15/2022   CO2 19 (L) 01/15/2022   GLUCOSE 140 (H) 01/15/2022   BUN 16 01/15/2022   CREATININE 0.80 01/15/2022   BILITOT 0.5 01/15/2022   ALKPHOS 62 03/19/2020   AST 14 01/15/2022   ALT 15 01/15/2022   PROT 7.7 01/15/2022   PROT 7.5 01/15/2022   ALBUMIN 3.9 03/19/2020   CALCIUM 9.7 01/15/2022    GFRAA >60 12/01/2019    Speciality Comments: No specialty comments available.  Procedures:  No procedures performed Allergies: Gabapentin, Hydrochlorothiazide, Lipitor [atorvastatin], Penicillins, and Orange juice [orange oil]     Assessment / Plan:     Visit Diagnoses: Sjogren's syndrome with keratoconjunctivitis sicca (HCC) - Positive ANA, positive SSA, sicca symptoms, arthralgias: She continues to have chronic sicca symptoms-unchanged. She underwent an ultrasound of both hands on 02/12/22 which was positive for synovitis.  The diagnosis of sjogren's syndrome and inflammatory arthritis was discussed today in detail.  The use of Methotrexate was discussed for the management of inflammatory arthritis, but she declined to initiate therapy due to the concern for possible adverse effects.  Indications, contraindications, and potential side effects of MTX were discussed today in detail and all questions were addressed.  She has declined to start on treatment since her symptoms have improved since starting on meloxicam by her orthopedist.  She was given a handout of information about MTX to further review.  She will notify us if she changes her mind and would like to initiate therapy.  She would like to complete the workup for her left knee (MRI) to determine if she will require surgery prior to starting MTX. She will follow up in 6-8 weeks to further discuss.    Inflammatory arthritis - Anti-CCP and rheumatoid factor negative on 10/14/2021.  Ultrasound of both hands was positive for synovitis on 02/12/2022. She continues to have chronic pain and stiffness in both hands.  No obvious synovitis was noted on exam.  She is currently taking meloxicam 15 mg daily prescribed by ortho.  She has noticed improvement in her joint pain and stiffness since switching from celebrex to meloxicam.   The use of methotrexate was discussed at her last office visit on 02/12/2022.  Indications, contraindications, and potential  side effects of methotrexate were discussed today in detail.  All questions were addressed. She declined starting therapy at this time due to being apprehensive of SE.    High risk medication use - Declined use of MTX at this time.  Lab work from 01/15/2022 was reviewed today in the office: CBC WNL, creatinine was 0.79, GFR 88, AST 14, ALT 15 IFE did not reveal any monoclonal proteins. Future orders for baseline immunosuppressive labs were placed today.  CXR 02/27/22 no active cardiopulmonary disease.  Plan: Hepatitis B surface antigen, Hepatitis B core antibody, IgM, Hepatitis C antibody, IgG, IgA, IgM,  QuantiFERON-TB Gold Plus, CBC with Differential/Platelet, COMPLETE METABOLIC PANEL WITH GFR  Primary osteoarthritis of both hands: Chronic pain and stiffness.  U/s + for synovitis.    Paresthesia of both hands - Bilateral median nerves are within normal limits.  Trochanteric bursitis of both hips: Good ROM of both hip joints with no groin pain.   Primary osteoarthritis of both knees: Mild osteoarthritis and mild chondromalacia patella noted on x-rays from 10/14/21.  Currently under the care of orthopedics.  Awaiting approval for MRI of left knee. No response to prednisone taper.  No response to celebrex.  No response to home exercises.  Patient will not be starting PT until 04/16/22.  Using a cane to assist with ambulation.   She is currently taking meloxicam and robaxin as prescribed by ortho.  She is apprehensive to start MTX until she determines if she will require surgery.   DDD (degenerative disc disease), lumbar: Chronic pain.   Fibromyalgia: She continues to have positive tender points and hyperalgesia on examination today. She is taking meloxicam and robaxin.   Other medical conditions are listed as follows:  Anxiety and depression - She is not currently taking Cymbalta.  Her situational depression and anxiety have improved.  She has a new job and her husband was approved for [employer].   She does not want to restart on Cymbalta at this time.  Adjustment insomnia  Hypermobility of joint  Frequent headaches  Morbid obesity (HCC)  Pure hypertriglyceridemia  Prediabetes  Gastroesophageal reflux disease without esophagitis  Seasonal allergies  Family history of rheumatoid arthritis- two sisters and nephew  Orders: Orders Placed This Encounter  Procedures  . Hepatitis B surface antigen  . Hepatitis B core antibody, IgM  . Hepatitis C antibody  . IgG, IgA, IgM  . QuantiFERON-TB Gold Plus  . CBC with Differential/Platelet  . COMPLETE METABOLIC PANEL WITH GFR   No orders of the defined types were placed in this encounter.    Follow-Up Instructions: Return in 8 weeks (on 05/21/2022) for Sjogren's syndrome, Inflammatory arthritis, Osteoarthritis.   Ofilia Neas, PA-C  Note - This record has been created using Dragon software.  Chart creation errors have been sought, but may not always  have been located. Such creation errors do not reflect on  the standard of medical care.

## 2022-03-26 ENCOUNTER — Ambulatory Visit: Payer: Commercial Managed Care - HMO | Attending: Physician Assistant | Admitting: Physician Assistant

## 2022-03-26 ENCOUNTER — Encounter: Payer: Self-pay | Admitting: Physician Assistant

## 2022-03-26 VITALS — BP 124/88 | HR 88 | Resp 18 | Ht 65.0 in | Wt 274.0 lb

## 2022-03-26 DIAGNOSIS — M3501 Sicca syndrome with keratoconjunctivitis: Secondary | ICD-10-CM | POA: Diagnosis not present

## 2022-03-26 DIAGNOSIS — M19041 Primary osteoarthritis, right hand: Secondary | ICD-10-CM

## 2022-03-26 DIAGNOSIS — M1990 Unspecified osteoarthritis, unspecified site: Secondary | ICD-10-CM | POA: Diagnosis not present

## 2022-03-26 DIAGNOSIS — M7062 Trochanteric bursitis, left hip: Secondary | ICD-10-CM

## 2022-03-26 DIAGNOSIS — G8929 Other chronic pain: Secondary | ICD-10-CM

## 2022-03-26 DIAGNOSIS — M797 Fibromyalgia: Secondary | ICD-10-CM

## 2022-03-26 DIAGNOSIS — J302 Other seasonal allergic rhinitis: Secondary | ICD-10-CM

## 2022-03-26 DIAGNOSIS — M249 Joint derangement, unspecified: Secondary | ICD-10-CM

## 2022-03-26 DIAGNOSIS — F32A Depression, unspecified: Secondary | ICD-10-CM

## 2022-03-26 DIAGNOSIS — F419 Anxiety disorder, unspecified: Secondary | ICD-10-CM

## 2022-03-26 DIAGNOSIS — Z79899 Other long term (current) drug therapy: Secondary | ICD-10-CM | POA: Diagnosis not present

## 2022-03-26 DIAGNOSIS — R7303 Prediabetes: Secondary | ICD-10-CM

## 2022-03-26 DIAGNOSIS — M5136 Other intervertebral disc degeneration, lumbar region: Secondary | ICD-10-CM

## 2022-03-26 DIAGNOSIS — E781 Pure hyperglyceridemia: Secondary | ICD-10-CM

## 2022-03-26 DIAGNOSIS — M170 Bilateral primary osteoarthritis of knee: Secondary | ICD-10-CM

## 2022-03-26 DIAGNOSIS — M19042 Primary osteoarthritis, left hand: Secondary | ICD-10-CM

## 2022-03-26 DIAGNOSIS — M7061 Trochanteric bursitis, right hip: Secondary | ICD-10-CM

## 2022-03-26 DIAGNOSIS — M51369 Other intervertebral disc degeneration, lumbar region without mention of lumbar back pain or lower extremity pain: Secondary | ICD-10-CM

## 2022-03-26 DIAGNOSIS — Z8261 Family history of arthritis: Secondary | ICD-10-CM

## 2022-03-26 DIAGNOSIS — F5102 Adjustment insomnia: Secondary | ICD-10-CM

## 2022-03-26 DIAGNOSIS — K219 Gastro-esophageal reflux disease without esophagitis: Secondary | ICD-10-CM

## 2022-03-26 DIAGNOSIS — R202 Paresthesia of skin: Secondary | ICD-10-CM

## 2022-03-26 DIAGNOSIS — M1380 Other specified arthritis, unspecified site: Secondary | ICD-10-CM

## 2022-03-26 DIAGNOSIS — R519 Headache, unspecified: Secondary | ICD-10-CM

## 2022-03-26 NOTE — Patient Instructions (Addendum)
Methotrexate Tablets What is this medication? METHOTREXATE (METH oh TREX ate) treats inflammatory conditions such as arthritis and psoriasis. It works by decreasing inflammation, which can reduce pain and prevent long-term injury to the joints and skin. It may also be used to treat some types of cancer. It works by slowing down the growth of cancer cells. This medicine may be used for other purposes; ask your health care provider or pharmacist if you have questions. COMMON BRAND NAME(S): Rheumatrex, Trexall What should I tell my care team before I take this medication? They need to know if you have any of these conditions: Fluid in the stomach area or lungs If you often drink alcohol Infection or immune system problems Kidney disease or on hemodialysis Liver disease Low blood counts, like low white cell, platelet, or red cell counts Lung disease Radiation therapy Stomach ulcers Ulcerative colitis An unusual or allergic reaction to methotrexate, other medications, foods, dyes, or preservatives Pregnant or trying to get pregnant Breast-feeding How should I use this medication? Take this medication by mouth with a glass of water. Follow the directions on the prescription label. Take your medication at regular intervals. Do not take it more often than directed. Do not stop taking except on your care team's advice. Make sure you know why you are taking this medication and how often you should take it. If this medication is used for a condition that is not cancer, like arthritis or psoriasis, it should be taken weekly, NOT daily. Taking this medication more often than directed can cause serious side effects, even death. Talk to your care team about safe handling and disposal of this medication. You may need to take special precautions. Talk to your care team about the use of this medication in children. While this medication may be prescribed for selected conditions, precautions do  apply. Overdosage: If you think you have taken too much of this medicine contact a poison control center or emergency room at once. NOTE: This medicine is only for you. Do not share this medicine with others. What if I miss a dose? If you miss a dose, talk with your care team. Do not take double or extra doses. What may interact with this medication? Do not take this medication with any of the following: Acitretin This medication may also interact with the following: Aspirin and aspirin-like medications including salicylates Azathioprine Certain antibiotics like penicillins, tetracycline, and chloramphenicol Certain medications that treat or prevent blood clots like warfarin, apixaban, dabigatran, and rivaroxaban Certain medications for stomach problems like esomeprazole, omeprazole, pantoprazole Cyclosporine Dapsone Diuretics Gold Hydroxychloroquine Live virus vaccines Medications for infection like acyclovir, adefovir, amphotericin B, bacitracin, cidofovir, foscarnet, ganciclovir, gentamicin, pentamidine, vancomycin Mercaptopurine NSAIDs, medications for pain and inflammation, like ibuprofen or naproxen Other cytotoxic agents Pamidronate Pemetrexed Penicillamine Phenylbutazone Phenytoin Probenecid Pyrimethamine Retinoids such as isotretinoin and tretinoin Steroid medications like prednisone or cortisone Sulfonamides like sulfasalazine and trimethoprim/sulfamethoxazole Theophylline Zoledronic acid This list may not describe all possible interactions. Give your health care provider a list of all the medicines, herbs, non-prescription drugs, or dietary supplements you use. Also tell them if you smoke, drink alcohol, or use illegal drugs. Some items may interact with your medicine. What should I watch for while using this medication? Avoid alcoholic drinks. This medication can make you more sensitive to the sun. Keep out of the sun. If you cannot avoid being in the sun, wear  protective clothing and use sunscreen. Do not use sun lamps or tanning beds/booths. You may need   blood work done while you are taking this medication. Call your care team for advice if you get a fever, chills or sore throat, or other symptoms of a cold or flu. Do not treat yourself. This medication decreases your body's ability to fight infections. Try to avoid being around people who are sick. This medication may increase your risk to bruise or bleed. Call your care team if you notice any unusual bleeding. Be careful brushing or flossing your teeth or using a toothpick because you may get an infection or bleed more easily. If you have any dental work done, tell your dentist you are receiving this medication. Check with your care team if you get an attack of severe diarrhea, nausea and vomiting, or if you sweat a lot. The loss of too much body fluid can make it dangerous for you to take this medication. Talk to your care team about your risk of cancer. You may be more at risk for certain types of cancers if you take this medication. Do not become pregnant while taking this medication or for 6 months after stopping it. Women should inform their care team if they wish to become pregnant or think they might be pregnant. Men should not father a child while taking this medication and for 3 months after stopping it. There is potential for serious harm to an unborn child. Talk to your care team for more information. Do not breast-feed an infant while taking this medication or for 1 week after stopping it. This medication may make it more difficult to get pregnant or father a child. Talk to your care team if you are concerned about your fertility. What side effects may I notice from receiving this medication? Side effects that you should report to your care team as soon as possible: Allergic reactions--skin rash, itching, hives, swelling of the face, lips, tongue, or throat Blood clot--pain, swelling, or warmth  in the leg, shortness of breath, chest pain Dry cough, shortness of breath or trouble breathing Infection--fever, chills, cough, sore throat, wounds that don't heal, pain or trouble when passing urine, general feeling of discomfort or being unwell Kidney injury--decrease in the amount of urine, swelling of the ankles, hands, or feet Liver injury--right upper belly pain, loss of appetite, nausea, light-colored stool, dark yellow or brown urine, yellowing of the skin or eyes, unusual weakness or fatigue Low red blood cell count--unusual weakness or fatigue, dizziness, headache, trouble breathing Redness, blistering, peeling, or loosening of the skin, including inside the mouth Seizures Unusual bruising or bleeding Side effects that usually do not require medical attention (report to your care team if they continue or are bothersome): Diarrhea Dizziness Hair loss Nausea Pain, redness, or swelling with sores inside the mouth or throat Vomiting This list may not describe all possible side effects. Call your doctor for medical advice about side effects. You may report side effects to FDA at 1-800-FDA-1088. Where should I keep my medication? Keep out of the reach of children and pets. Store at room temperature between 20 and 25 degrees C (68 and 77 degrees F). Protect from light. Get rid of any unused medication after the expiration date. Talk to your care team about how to dispose of unused medication. Special directions may apply.   If you have signs or symptoms of an infection or start antibiotics: First, call your PCP for workup of your infection. Hold your medication through the infection, until you complete your antibiotics, and until symptoms resolve if you take the  following: Injectable medication (Actemra, Benlysta, Cimzia, Cosentyx, Enbrel, Humira, Kevzara, Orencia, Remicade, Simponi, Stelara, Taltz, Tremfya) Methotrexate Leflunomide (Arava) Mycophenolate (Cellcept) Morrie Sheldon,  Olumiant, or Rinvoq   Vaccines You are taking a medication(s) that can suppress your immune system.  The following immunizations are recommended: Flu annually Covid-19  Td/Tdap (tetanus, diphtheria, pertussis) every 10 years Pneumonia (Prevnar 15 then Pneumovax 23 at least 1 year apart.  Alternatively, can take Prevnar 20 without needing additional dose) Shingrix: 2 doses from 4 weeks to 6 months apart  Please check with your PCP to make sure you are up to date.

## 2022-04-04 NOTE — Progress Notes (Unsigned)
Office Visit Note  Patient: [name]             Date of Birth: 18-Apr-1966           MRN: [id number]             PCP: Jearld Fenton, NP Referring: Jearld Fenton, NP Visit Date: 04/17/2022 Occupation: '@GUAROCC'$ @  Subjective:  Discuss treatment options   History of Present Illness: [name] is a 56 y.o. female with history sjogren's syndrome, inflammatory arthritis, and osteoarthritis.   Discussed MTX   01/15/2022: IFE did not reveal any monoclonal proteins. CXR 02/27/22 no active cardiopulmonary disease.   Activities of Daily Living:  Patient reports morning stiffness for *** {minute/hour:19697}.   Patient {ACTIONS;DENIES/REPORTS:21021675::"Denies"} nocturnal pain.  Difficulty dressing/grooming: {ACTIONS;DENIES/REPORTS:21021675::"Denies"} Difficulty climbing stairs: {ACTIONS;DENIES/REPORTS:21021675::"Denies"} Difficulty getting out of chair: {ACTIONS;DENIES/REPORTS:21021675::"Denies"} Difficulty using hands for taps, buttons, cutlery, and/or writing: {ACTIONS;DENIES/REPORTS:21021675::"Denies"}  No Rheumatology ROS completed.   PMFS History:  Patient Active Problem List   Diagnosis Date Noted   Primary osteoarthritis of both hands 10/31/2021   Fibromyalgia 10/24/2021   Sjogren's syndrome ([place]) 10/24/2021   DDD (degenerative disc disease), lumbar 10/14/2021   Insomnia 02/14/2021   Prediabetes 12/31/2020   HLD (hyperlipidemia) 12/31/2020   Frequent headaches 12/31/2020   Morbid obesity ([place]) 12/31/2020   Gastroesophageal reflux disease without esophagitis 11/15/2018   Anxiety and depression 08/03/2014    Past Medical History:  Diagnosis Date   Allergy    DDD (degenerative disc disease), lumbar    Depression    Pneumonia     Family History  Problem Relation Age of Onset   Hyperlipidemia Mother    Rheum arthritis Sister    Osteoarthritis Sister    Fibroids Sister    Fibroids Sister    Fibroids Sister    Heart disease Maternal Aunt     Stroke Maternal Aunt    Diabetes Maternal Aunt    Heart disease Maternal Uncle    Stroke Maternal Uncle    Rheum arthritis Nephew    Cancer Neg Hx    Past Surgical History:  Procedure Laterality Date   ABDOMINAL HYSTERECTOMY  2008   LAVH, endometriosis ablation, mccall culdoplasty   CHOLECYSTECTOMY N/A 08/31/2019   Procedure: LAPAROSCOPIC CHOLECYSTECTOMY;  Surgeon: Olean Ree, MD;  Location: [facility] ORS;  Service: General;  Laterality: N/A;   DIAGNOSTIC LAPAROSCOPY  2002   Hysteroscopy, diagnostic laparoscopy, extensive lysis of adhesions, endometrial ablation, RSO   RIGHT OOPHORECTOMY  2007   with right salpingectomy   TUBAL LIGATION     Social History   Social History Narrative   Not on file   Immunization History  Administered Date(s) Administered   Influenza,inj,Quad PF,6+ Mos 03/17/2015, 03/17/2017, 02/25/2019, 03/19/2020, 03/28/2021   PFIZER(Purple Top)SARS-COV-2 Vaccination 07/11/2019, 08/01/2019   Tdap 02/09/2007, 09/18/2016     Objective: Vital Signs: There were no vitals taken for this visit.   Physical Exam Vitals and nursing note reviewed.  Constitutional:      Appearance: She is well-developed.  HENT:     Head: Normocephalic and atraumatic.  Eyes:     Conjunctiva/sclera: Conjunctivae normal.  Cardiovascular:     Rate and Rhythm: Normal rate and regular rhythm.     Heart sounds: Normal heart sounds.  Pulmonary:     Effort: Pulmonary effort is normal.     Breath sounds: Normal breath sounds.  Abdominal:     General: Bowel sounds are normal.     Palpations: Abdomen is soft.  Musculoskeletal:  Cervical back: Normal range of motion.  Skin:    General: Skin is warm and dry.     Capillary Refill: Capillary refill takes less than 2 seconds.  Neurological:     Mental Status: She is alert and oriented to person, place, and time.  Psychiatric:        Behavior: Behavior normal.      Musculoskeletal Exam: ***  CDAI Exam: CDAI Score: -- Patient  Global: --; Provider Global: -- Swollen: --; Tender: -- Joint Exam 04/17/2022   No joint exam has been documented for this visit   There is currently no information documented on the homunculus. Go to the Rheumatology activity and complete the homunculus joint exam.  Investigation: No additional findings.  Imaging: No results found.  Recent Labs: Lab Results  Component Value Date   WBC 5.5 01/15/2022   HGB 12.3 01/15/2022   PLT 227 01/15/2022   NA 141 01/15/2022   K 4.0 01/15/2022   CL 113 (H) 01/15/2022   CO2 19 (L) 01/15/2022   GLUCOSE 140 (H) 01/15/2022   BUN 16 01/15/2022   CREATININE 0.80 01/15/2022   BILITOT 0.5 01/15/2022   ALKPHOS 62 03/19/2020   AST 14 01/15/2022   ALT 15 01/15/2022   PROT 7.7 01/15/2022   PROT 7.5 01/15/2022   ALBUMIN 3.9 03/19/2020   CALCIUM 9.7 01/15/2022   GFRAA >60 12/01/2019    Speciality Comments: No specialty comments available.  Procedures:  No procedures performed Allergies: Gabapentin, Hydrochlorothiazide, Lipitor [atorvastatin], Penicillins, and Orange juice [orange oil]   Assessment / Plan:     Visit Diagnoses: No diagnosis found.  Orders: No orders of the defined types were placed in this encounter.  No orders of the defined types were placed in this encounter.   Face-to-face time spent with patient was *** minutes. Greater than 50% of time was spent in counseling and coordination of care.  Follow-Up Instructions: No follow-ups on file.   Earnestine Mealing, CMA  Note - This record has been created using Editor, commissioning.  Chart creation errors have been sought, but may not always  have been located. Such creation errors do not reflect on  the standard of medical care.

## 2022-04-07 MED ORDER — PREDNISONE 5 MG PO TABS: ORAL_TABLET | ORAL | 0 refills | Status: DC

## 2022-04-07 NOTE — Telephone Encounter (Signed)
Okay to send in prednisone 20 mg tapering by 5 mg every 4 days.  Please advise the patient to avoid NSAID use while taking prednisone.  She should take prednisone in the morning with food.  I would highly recommend that she consider starting on methotrexate to prevent future flares.

## 2022-04-17 ENCOUNTER — Encounter: Payer: Self-pay | Admitting: Physician Assistant

## 2022-04-17 ENCOUNTER — Ambulatory Visit: Payer: Commercial Managed Care - HMO | Attending: Physician Assistant | Admitting: Physician Assistant

## 2022-04-17 VITALS — BP 130/82 | HR 83 | Resp 18 | Ht 65.0 in | Wt 274.4 lb

## 2022-04-17 DIAGNOSIS — M3501 Sicca syndrome with keratoconjunctivitis: Secondary | ICD-10-CM

## 2022-04-17 DIAGNOSIS — M249 Joint derangement, unspecified: Secondary | ICD-10-CM

## 2022-04-17 DIAGNOSIS — M19041 Primary osteoarthritis, right hand: Secondary | ICD-10-CM

## 2022-04-17 DIAGNOSIS — R202 Paresthesia of skin: Secondary | ICD-10-CM

## 2022-04-17 DIAGNOSIS — Z79899 Other long term (current) drug therapy: Secondary | ICD-10-CM

## 2022-04-17 DIAGNOSIS — J302 Other seasonal allergic rhinitis: Secondary | ICD-10-CM

## 2022-04-17 DIAGNOSIS — M19042 Primary osteoarthritis, left hand: Secondary | ICD-10-CM

## 2022-04-17 DIAGNOSIS — M1990 Unspecified osteoarthritis, unspecified site: Secondary | ICD-10-CM | POA: Diagnosis not present

## 2022-04-17 DIAGNOSIS — K219 Gastro-esophageal reflux disease without esophagitis: Secondary | ICD-10-CM

## 2022-04-17 DIAGNOSIS — M797 Fibromyalgia: Secondary | ICD-10-CM

## 2022-04-17 DIAGNOSIS — F32A Depression, unspecified: Secondary | ICD-10-CM

## 2022-04-17 DIAGNOSIS — Z8261 Family history of arthritis: Secondary | ICD-10-CM

## 2022-04-17 DIAGNOSIS — R519 Headache, unspecified: Secondary | ICD-10-CM

## 2022-04-17 DIAGNOSIS — F419 Anxiety disorder, unspecified: Secondary | ICD-10-CM

## 2022-04-17 DIAGNOSIS — M7062 Trochanteric bursitis, left hip: Secondary | ICD-10-CM

## 2022-04-17 DIAGNOSIS — M170 Bilateral primary osteoarthritis of knee: Secondary | ICD-10-CM

## 2022-04-17 DIAGNOSIS — R7303 Prediabetes: Secondary | ICD-10-CM

## 2022-04-17 DIAGNOSIS — F5102 Adjustment insomnia: Secondary | ICD-10-CM

## 2022-04-17 DIAGNOSIS — E781 Pure hyperglyceridemia: Secondary | ICD-10-CM

## 2022-04-17 DIAGNOSIS — M7061 Trochanteric bursitis, right hip: Secondary | ICD-10-CM

## 2022-04-17 DIAGNOSIS — M5136 Other intervertebral disc degeneration, lumbar region: Secondary | ICD-10-CM

## 2022-04-17 NOTE — Patient Instructions (Signed)
Standing Labs We placed an order today for your standing lab work.   Please have your standing labs drawn in 2 weeks x2, 2 months, then every 3 months   Please have your labs drawn 2 weeks prior to your appointment so that the provider can discuss your lab results at your appointment.  Please note that you may see your imaging and lab results in [place] before we have reviewed them. We will contact you once all results are reviewed. Please allow our office up to 72 hours to thoroughly review all of the results before contacting the office for clarification of your results.  Lab hours are:   Monday through Thursday from 8:00 am -12:30 pm and 1:00 pm-5:00 pm and Friday from 8:00 am-12:00 pm.  Please be advised, all patients with office appointments requiring lab work will take precedent over walk-in lab work.   Labs are drawn by [facility]. Please bring your co-pay at the time of your lab draw.  You may receive a bill from [facility] for your lab work.  Please note if you are on Hydroxychloroquine and and an order has been placed for a Hydroxychloroquine level, you will need to have it drawn 4 hours or more after your last dose.  If you wish to have your labs drawn at another location, please call the office 24 hours in advance so we can fax the orders.  The office is located at [address], [place], [place], [place] [address] No appointment is necessary.    If you have any questions regarding directions or hours of operation,  please call [phone].   As a reminder, please drink plenty of water prior to coming for your lab work. Thanks!   Methotrexate Tablets What is this medication? METHOTREXATE (METH oh TREX ate) treats inflammatory conditions such as arthritis and psoriasis. It works by decreasing inflammation, which can reduce pain and prevent long-term injury to the joints and skin. It may also be used to treat some types of cancer. It works by slowing down the growth of cancer  cells. This medicine may be used for other purposes; ask your health care provider or pharmacist if you have questions. COMMON BRAND NAME(S): Rheumatrex, Trexall What should I tell my care team before I take this medication? They need to know if you have any of these conditions: Fluid in the stomach area or lungs If you often drink alcohol Infection or immune system problems Kidney disease or on hemodialysis Liver disease Low blood counts, like low white cell, platelet, or red cell counts Lung disease Radiation therapy Stomach ulcers Ulcerative colitis An unusual or allergic reaction to methotrexate, other medications, foods, dyes, or preservatives Pregnant or trying to get pregnant Breast-feeding How should I use this medication? Take this medication by mouth with a glass of water. Follow the directions on the prescription label. Take your medication at regular intervals. Do not take it more often than directed. Do not stop taking except on your care team's advice. Make sure you know why you are taking this medication and how often you should take it. If this medication is used for a condition that is not cancer, like arthritis or psoriasis, it should be taken weekly, NOT daily. Taking this medication more often than directed can cause serious side effects, even death. Talk to your care team about safe handling and disposal of this medication. You may need to take special precautions. Talk to your care team about the use of this medication in children.  While this medication may be prescribed for selected conditions, precautions do apply. Overdosage: If you think you have taken too much of this medicine contact a poison control center or emergency room at once. NOTE: This medicine is only for you. Do not share this medicine with others. What if I miss a dose? If you miss a dose, talk with your care team. Do not take double or extra doses. What may interact with this medication? Do not  take this medication with any of the following: Acitretin This medication may also interact with the following: Aspirin and aspirin-like medications including salicylates Azathioprine Certain antibiotics like penicillins, tetracycline, and chloramphenicol Certain medications that treat or prevent blood clots like warfarin, apixaban, dabigatran, and rivaroxaban Certain medications for stomach problems like esomeprazole, omeprazole, pantoprazole Cyclosporine Dapsone Diuretics Gold Hydroxychloroquine Live virus vaccines Medications for infection like acyclovir, adefovir, amphotericin B, bacitracin, cidofovir, foscarnet, ganciclovir, gentamicin, pentamidine, vancomycin Mercaptopurine NSAIDs, medications for pain and inflammation, like ibuprofen or naproxen Other cytotoxic agents Pamidronate Pemetrexed Penicillamine Phenylbutazone Phenytoin Probenecid Pyrimethamine Retinoids such as isotretinoin and tretinoin Steroid medications like prednisone or cortisone Sulfonamides like sulfasalazine and trimethoprim/sulfamethoxazole Theophylline Zoledronic acid This list may not describe all possible interactions. Give your health care provider a list of all the medicines, herbs, non-prescription drugs, or dietary supplements you use. Also tell them if you smoke, drink alcohol, or use illegal drugs. Some items may interact with your medicine. What should I watch for while using this medication? Avoid alcoholic drinks. This medication can make you more sensitive to the sun. Keep out of the sun. If you cannot avoid being in the sun, wear protective clothing and use sunscreen. Do not use sun lamps or tanning beds/booths. You may need blood work done while you are taking this medication. Call your care team for advice if you get a fever, chills or sore throat, or other symptoms of a cold or flu. Do not treat yourself. This medication decreases your body's ability to fight infections. Try to avoid  being around people who are sick. This medication may increase your risk to bruise or bleed. Call your care team if you notice any unusual bleeding. Be careful brushing or flossing your teeth or using a toothpick because you may get an infection or bleed more easily. If you have any dental work done, tell your dentist you are receiving this medication. Check with your care team if you get an attack of severe diarrhea, nausea and vomiting, or if you sweat a lot. The loss of too much body fluid can make it dangerous for you to take this medication. Talk to your care team about your risk of cancer. You may be more at risk for certain types of cancers if you take this medication. Do not become pregnant while taking this medication or for 6 months after stopping it. Women should inform their care team if they wish to become pregnant or think they might be pregnant. Men should not father a child while taking this medication and for 3 months after stopping it. There is potential for serious harm to an unborn child. Talk to your care team for more information. Do not breast-feed an infant while taking this medication or for 1 week after stopping it. This medication may make it more difficult to get pregnant or father a child. Talk to your care team if you are concerned about your fertility. What side effects may I notice from receiving this medication? Side effects that you should report to your  care team as soon as possible: Allergic reactions--skin rash, itching, hives, swelling of the face, lips, tongue, or throat Blood clot--pain, swelling, or warmth in the leg, shortness of breath, chest pain Dry cough, shortness of breath or trouble breathing Infection--fever, chills, cough, sore throat, wounds that don't heal, pain or trouble when passing urine, general feeling of discomfort or being unwell Kidney injury--decrease in the amount of urine, swelling of the ankles, hands, or feet Liver injury--right upper  belly pain, loss of appetite, nausea, light-colored stool, dark yellow or brown urine, yellowing of the skin or eyes, unusual weakness or fatigue Low red blood cell count--unusual weakness or fatigue, dizziness, headache, trouble breathing Redness, blistering, peeling, or loosening of the skin, including inside the mouth Seizures Unusual bruising or bleeding Side effects that usually do not require medical attention (report to your care team if they continue or are bothersome): Diarrhea Dizziness Hair loss Nausea Pain, redness, or swelling with sores inside the mouth or throat Vomiting This list may not describe all possible side effects. Call your doctor for medical advice about side effects. You may report side effects to FDA at 1-800-FDA-1088. Where should I keep my medication? Keep out of the reach of children and pets. Store at room temperature between 20 and 25 degrees C (68 and 77 degrees F). Protect from light. Get rid of any unused medication after the expiration date. Talk to your care team about how to dispose of unused medication. Special directions may apply. NOTE: This sheet is a summary. It may not cover all possible information. If you have questions about this medicine, talk to your doctor, pharmacist, or health care provider.  2023 Elsevier/Gold Standard (2020-08-06 00:00:00)

## 2022-04-18 NOTE — Progress Notes (Signed)
Glucose is 133. Rest of CMP WNL.  CBC WNL. Immunoglobulins WNL

## 2022-04-21 ENCOUNTER — Ambulatory Visit: Payer: Commercial Managed Care - HMO | Admitting: Surgery

## 2022-04-21 NOTE — Progress Notes (Signed)
Hepatitis B and HIV negative

## 2022-04-22 LAB — COMPLETE METABOLIC PANEL WITH GFR
AG Ratio: 1.4 (calc) (ref 1.0–2.5)
ALT: 12 U/L (ref 6–29)
AST: 10 U/L (ref 10–35)
Albumin: 4 g/dL (ref 3.6–5.1)
Alkaline phosphatase (APISO): 54 U/L (ref 37–153)
BUN: 16 mg/dL (ref 7–25)
CO2: 25 mmol/L (ref 20–32)
Calcium: 9.3 mg/dL (ref 8.6–10.4)
Chloride: 109 mmol/L (ref 98–110)
Creat: 0.79 mg/dL (ref 0.50–1.03)
Globulin: 2.8 g/dL (calc) (ref 1.9–3.7)
Glucose, Bld: 133 mg/dL — ABNORMAL HIGH (ref 65–99)
Potassium: 3.9 mmol/L (ref 3.5–5.3)
Sodium: 142 mmol/L (ref 135–146)
Total Bilirubin: 0.4 mg/dL (ref 0.2–1.2)
Total Protein: 6.8 g/dL (ref 6.1–8.1)
eGFR: 88 mL/min/{1.73_m2} (ref >=60)

## 2022-04-22 LAB — HEPATITIS B SURFACE ANTIGEN: Hepatitis B Surface Ag: NONREACTIVE

## 2022-04-22 LAB — QUANTIFERON-TB GOLD PLUS
Mitogen-NIL: 9.07 IU/mL
NIL: 0.03 IU/mL
QuantiFERON-TB Gold Plus: NEGATIVE
TB1-NIL: 0 IU/mL
TB2-NIL: 0 IU/mL

## 2022-04-22 LAB — CBC WITH DIFFERENTIAL/PLATELET
Absolute Monocytes: 352 cells/uL (ref 200–950)
Basophils Absolute: 51 cells/uL (ref 0–200)
Basophils Relative: 1 %
Eosinophils Absolute: 133 cells/uL (ref 15–500)
Eosinophils Relative: 2.6 %
HCT: 38.4 % (ref 35.0–45.0)
Hemoglobin: 12.9 g/dL (ref 11.7–15.5)
Lymphs Abs: 1617 cells/uL (ref 850–3900)
MCH: 31.5 pg (ref 27.0–33.0)
MCHC: 33.6 g/dL (ref 32.0–36.0)
MCV: 93.9 fL (ref 80.0–100.0)
MPV: 11.5 fL (ref 7.5–12.5)
Monocytes Relative: 6.9 %
Neutro Abs: 2948 cells/uL (ref 1500–7800)
Neutrophils Relative %: 57.8 %
Platelets: 241 10*3/uL (ref 140–400)
RBC: 4.09 10*6/uL (ref 3.80–5.10)
RDW: 13.4 % (ref 11.0–15.0)
Total Lymphocyte: 31.7 %
WBC: 5.1 10*3/uL (ref 3.8–10.8)

## 2022-04-22 LAB — IGG, IGA, IGM
IgG (Immunoglobin G), Serum: 1303 mg/dL (ref 600–1640)
IgM, Serum: 59 mg/dL (ref 50–300)
Immunoglobulin A: 219 mg/dL (ref 47–310)

## 2022-04-22 LAB — HIV ANTIBODY (ROUTINE TESTING W REFLEX): HIV 1&2 Ab, 4th Generation: NONREACTIVE

## 2022-04-22 LAB — HEPATITIS B CORE ANTIBODY, IGM: Hep B C IgM: NONREACTIVE

## 2022-04-22 NOTE — Progress Notes (Signed)
TB gold negative

## 2022-04-28 MED ORDER — METHOTREXATE SODIUM 2.5 MG PO TABS: ORAL_TABLET | ORAL | 0 refills | Status: DC

## 2022-04-28 MED ORDER — FOLIC ACID 1 MG PO TABS: 2.0000 mg | ORAL_TABLET | Freq: Every day | ORAL | 3 refills | Status: DC

## 2022-04-28 NOTE — Telephone Encounter (Signed)
Plan to initiate methotrexate 6 tablets by mouth once weekly x2 weeks and if labs are stable she will increase to 8 tablets weekly.  She will take folic acid 2 mg daily.

## 2022-05-01 MED ORDER — PREDNISONE 5 MG PO TABS: ORAL_TABLET | ORAL | 0 refills | Status: DC

## 2022-05-01 NOTE — Telephone Encounter (Signed)
Please send a prednisone taper starting at 20 mg and taper by 5 mg every 4 days.  Please notify patient that prednisone causes increase in blood pressure, increasing blood sugar, increased risk of osteoporosis, weight gain and increased risk of heart disease.

## 2022-05-01 NOTE — Telephone Encounter (Signed)
Patient is flaring. Patient states she is having pain in all of her joints. Knees, hands, wrist and feet. Patient states she is starting the MTX on Friday. Patient is requesting a prescription for Prednisone. Please advise.

## 2022-05-01 NOTE — Addendum Note (Signed)
Addended by: Carole Binning on: 05/01/2022 04:34 PM   Modules accepted: Orders

## 2022-05-12 NOTE — Progress Notes (Unsigned)
Office Visit Note  Patient: [name]             Date of Birth: [birth year]           MRN: [id number]             PCP: Jearld Fenton, NP Referring: Jearld Fenton, NP Visit Date: 05/22/2022 Occupation: '@GUAROCC'$ @  Subjective:  Mediation monitoring   History of Present Illness: [name] is a 56 y.o. female with history of sjogren's syndrome, osteoarthritis, and inflammatory arthritis.  Patient was started on methotrexate and folic acid after her last office visit.   CBC and CMP updated on 04/17/22.  TB gold negative on 04/17/22.  Discussed the importance of holding methotrexate if she develops signs or symptoms of an infection and to resume once the infection has completely cleared.   Activities of Daily Living:  Patient reports morning stiffness for *** {minute/hour:19697}.   Patient {ACTIONS;DENIES/REPORTS:21021675::"Denies"} nocturnal pain.  Difficulty dressing/grooming: {ACTIONS;DENIES/REPORTS:21021675::"Denies"} Difficulty climbing stairs: {ACTIONS;DENIES/REPORTS:21021675::"Denies"} Difficulty getting out of chair: {ACTIONS;DENIES/REPORTS:21021675::"Denies"} Difficulty using hands for taps, buttons, cutlery, and/or writing: {ACTIONS;DENIES/REPORTS:21021675::"Denies"}  No Rheumatology ROS completed.   PMFS History:  Patient Active Problem List   Diagnosis Date Noted   Primary osteoarthritis of both hands 10/31/2021   Fibromyalgia 10/24/2021   Sjogren's syndrome ([place]) 10/24/2021   DDD (degenerative disc disease), lumbar 10/14/2021   Insomnia 02/14/2021   Prediabetes 12/31/2020   HLD (hyperlipidemia) 12/31/2020   Frequent headaches 12/31/2020   Morbid obesity ([place]) 12/31/2020   Gastroesophageal reflux disease without esophagitis 11/15/2018   Anxiety and depression 08/03/2014    Past Medical History:  Diagnosis Date   Allergy    DDD (degenerative disc disease), lumbar    Depression    Pneumonia     Family History  Problem Relation Age of  Onset   Hyperlipidemia Mother    Rheum arthritis Sister    Osteoarthritis Sister    Fibroids Sister    Fibroids Sister    Fibroids Sister    Heart disease Maternal Aunt    Stroke Maternal Aunt    Diabetes Maternal Aunt    Heart disease Maternal Uncle    Stroke Maternal Uncle    Rheum arthritis Nephew    Cancer Neg Hx    Past Surgical History:  Procedure Laterality Date   ABDOMINAL HYSTERECTOMY  2008   LAVH, endometriosis ablation, mccall culdoplasty   CHOLECYSTECTOMY N/A 08/31/2019   Procedure: LAPAROSCOPIC CHOLECYSTECTOMY;  Surgeon: Olean Ree, MD;  Location: [facility] ORS;  Service: General;  Laterality: N/A;   DIAGNOSTIC LAPAROSCOPY  2002   Hysteroscopy, diagnostic laparoscopy, extensive lysis of adhesions, endometrial ablation, RSO   RIGHT OOPHORECTOMY  2007   with right salpingectomy   TUBAL LIGATION     Social History   Social History Narrative   Not on file   Immunization History  Administered Date(s) Administered   Influenza,inj,Quad PF,6+ Mos 03/17/2015, 03/17/2017, 02/25/2019, 03/19/2020, 03/28/2021   PFIZER(Purple Top)SARS-COV-2 Vaccination 07/11/2019, 08/01/2019   Tdap 02/09/2007, 09/18/2016     Objective: Vital Signs: There were no vitals taken for this visit.   Physical Exam Vitals and nursing note reviewed.  Constitutional:      Appearance: She is well-developed.  HENT:     Head: Normocephalic and atraumatic.  Eyes:     Conjunctiva/sclera: Conjunctivae normal.  Cardiovascular:     Rate and Rhythm: Normal rate and regular rhythm.     Heart sounds: Normal heart sounds.  Pulmonary:     Effort: Pulmonary effort  is normal.     Breath sounds: Normal breath sounds.  Abdominal:     General: Bowel sounds are normal.     Palpations: Abdomen is soft.  Musculoskeletal:     Cervical back: Normal range of motion.  Skin:    General: Skin is warm and dry.     Capillary Refill: Capillary refill takes less than 2 seconds.  Neurological:     Mental Status:  She is alert and oriented to person, place, and time.  Psychiatric:        Behavior: Behavior normal.      Musculoskeletal Exam: ***  CDAI Exam: CDAI Score: -- Patient Global: --; Provider Global: -- Swollen: --; Tender: -- Joint Exam 05/22/2022   No joint exam has been documented for this visit   There is currently no information documented on the homunculus. Go to the Rheumatology activity and complete the homunculus joint exam.  Investigation: No additional findings.  Imaging: No results found.  Recent Labs: Lab Results  Component Value Date   WBC 5.1 04/17/2022   HGB 12.9 04/17/2022   PLT 241 04/17/2022   NA 142 04/17/2022   K 3.9 04/17/2022   CL 109 04/17/2022   CO2 25 04/17/2022   GLUCOSE 133 (H) 04/17/2022   BUN 16 04/17/2022   CREATININE 0.79 04/17/2022   BILITOT 0.4 04/17/2022   ALKPHOS 62 03/19/2020   AST 10 04/17/2022   ALT 12 04/17/2022   PROT 6.8 04/17/2022   ALBUMIN 3.9 03/19/2020   CALCIUM 9.3 04/17/2022   GFRAA >60 12/01/2019   QFTBGOLDPLUS NEGATIVE 04/17/2022    Speciality Comments: No specialty comments available.  Procedures:  No procedures performed Allergies: Gabapentin, Hydrochlorothiazide, Lipitor [atorvastatin], Penicillins, and Orange juice [orange oil]   Assessment / Plan:     Visit Diagnoses: No diagnosis found.  Orders: No orders of the defined types were placed in this encounter.  No orders of the defined types were placed in this encounter.   Face-to-face time spent with patient was *** minutes. Greater than 50% of time was spent in counseling and coordination of care.  Follow-Up Instructions: No follow-ups on file.   Earnestine Mealing, CMA  Note - This record has been created using Editor, commissioning.  Chart creation errors have been sought, but may not always  have been located. Such creation errors do not reflect on  the standard of medical care.

## 2022-05-20 ENCOUNTER — Encounter: Payer: Self-pay | Admitting: Internal Medicine

## 2022-05-21 MED ORDER — HYDROCORTISONE (PERIANAL) 2.5 % EX CREA: 1.0000 | TOPICAL_CREAM | Freq: Two times a day (BID) | CUTANEOUS | 0 refills | Status: DC

## 2022-05-22 ENCOUNTER — Encounter: Payer: Self-pay | Admitting: Physician Assistant

## 2022-05-22 ENCOUNTER — Other Ambulatory Visit: Payer: Self-pay | Admitting: Orthopedic Surgery

## 2022-05-22 ENCOUNTER — Encounter: Payer: Self-pay | Admitting: Orthopedic Surgery

## 2022-05-22 ENCOUNTER — Other Ambulatory Visit: Payer: Self-pay | Admitting: Physician Assistant

## 2022-05-22 ENCOUNTER — Ambulatory Visit: Payer: Commercial Managed Care - HMO | Attending: Physician Assistant | Admitting: Physician Assistant

## 2022-05-22 VITALS — BP 134/84 | HR 94 | Resp 18 | Ht 65.0 in | Wt 272.0 lb

## 2022-05-22 DIAGNOSIS — Z79899 Other long term (current) drug therapy: Secondary | ICD-10-CM

## 2022-05-22 DIAGNOSIS — M1990 Unspecified osteoarthritis, unspecified site: Secondary | ICD-10-CM | POA: Diagnosis not present

## 2022-05-22 DIAGNOSIS — Z01818 Encounter for other preprocedural examination: Secondary | ICD-10-CM

## 2022-05-22 DIAGNOSIS — M3501 Sicca syndrome with keratoconjunctivitis: Secondary | ICD-10-CM

## 2022-05-22 DIAGNOSIS — Z8261 Family history of arthritis: Secondary | ICD-10-CM

## 2022-05-22 DIAGNOSIS — R7303 Prediabetes: Secondary | ICD-10-CM

## 2022-05-22 DIAGNOSIS — E781 Pure hyperglyceridemia: Secondary | ICD-10-CM

## 2022-05-22 DIAGNOSIS — M19042 Primary osteoarthritis, left hand: Secondary | ICD-10-CM

## 2022-05-22 DIAGNOSIS — R519 Headache, unspecified: Secondary | ICD-10-CM

## 2022-05-22 DIAGNOSIS — M797 Fibromyalgia: Secondary | ICD-10-CM

## 2022-05-22 DIAGNOSIS — F32A Depression, unspecified: Secondary | ICD-10-CM

## 2022-05-22 DIAGNOSIS — F5102 Adjustment insomnia: Secondary | ICD-10-CM

## 2022-05-22 DIAGNOSIS — M7062 Trochanteric bursitis, left hip: Secondary | ICD-10-CM

## 2022-05-22 DIAGNOSIS — J302 Other seasonal allergic rhinitis: Secondary | ICD-10-CM

## 2022-05-22 DIAGNOSIS — K219 Gastro-esophageal reflux disease without esophagitis: Secondary | ICD-10-CM

## 2022-05-22 DIAGNOSIS — M5136 Other intervertebral disc degeneration, lumbar region: Secondary | ICD-10-CM

## 2022-05-22 DIAGNOSIS — M170 Bilateral primary osteoarthritis of knee: Secondary | ICD-10-CM

## 2022-05-22 DIAGNOSIS — M19041 Primary osteoarthritis, right hand: Secondary | ICD-10-CM

## 2022-05-22 DIAGNOSIS — M7061 Trochanteric bursitis, right hip: Secondary | ICD-10-CM

## 2022-05-22 DIAGNOSIS — F419 Anxiety disorder, unspecified: Secondary | ICD-10-CM

## 2022-05-22 DIAGNOSIS — R202 Paresthesia of skin: Secondary | ICD-10-CM

## 2022-05-22 DIAGNOSIS — M249 Joint derangement, unspecified: Secondary | ICD-10-CM

## 2022-05-22 NOTE — H&P (Signed)
[name] MRN:  [id number] DOB/SEX:  1966-02-23/female  CHIEF COMPLAINT:  Painful left Knee  HISTORY: Patient is a 56 y.o. female presented with a history of pain in the left knee. Onset of symptoms was abrupt starting several years ago with gradually worsening course since that time. Prior procedures on the knee include none. Patient has been treated conservatively with over-the-counter NSAIDs and activity modification. Patient currently rates pain in the knee at 10 out of 10 with activity. There is no pain at night.  PAST MEDICAL HISTORY: Patient Active Problem List   Diagnosis Date Noted   Primary osteoarthritis of both hands 10/31/2021   Fibromyalgia 10/24/2021   Sjogren's syndrome ([place]) 10/24/2021   DDD (degenerative disc disease), lumbar 10/14/2021   Insomnia 02/14/2021   Prediabetes 12/31/2020   HLD (hyperlipidemia) 12/31/2020   Frequent headaches 12/31/2020   Morbid obesity ([place]) 12/31/2020   Gastroesophageal reflux disease without esophagitis 11/15/2018   Anxiety and depression 08/03/2014   Past Medical History:  Diagnosis Date   Allergy    DDD (degenerative disc disease), lumbar    Depression    Pneumonia    Past Surgical History:  Procedure Laterality Date   ABDOMINAL HYSTERECTOMY  2008   LAVH, endometriosis ablation, mccall culdoplasty   CHOLECYSTECTOMY N/A 08/31/2019   Procedure: LAPAROSCOPIC CHOLECYSTECTOMY;  Surgeon: Olean Ree, MD;  Location: [facility] ORS;  Service: General;  Laterality: N/A;   DIAGNOSTIC LAPAROSCOPY  2002   Hysteroscopy, diagnostic laparoscopy, extensive lysis of adhesions, endometrial ablation, RSO   RIGHT OOPHORECTOMY  2007   with right salpingectomy   TUBAL LIGATION       MEDICATIONS:  (Not in a hospital admission)   ALLERGIES:   Allergies  Allergen Reactions   Gabapentin Other (See Comments)    Hair loss   Hydrochlorothiazide Other (See Comments)    The pt state she couldn't function and felt like she was dying     Lipitor [Atorvastatin]     Muscle pain   Penicillins Other (See Comments)    Childhood allergy Has patient had a PCN reaction causing immediate rash, facial/tongue/throat swelling, SOB or lightheadedness with hypotension: Unknown Has patient had a PCN reaction causing severe rash involving mucus membranes or skin necrosis: Unknown Has patient had a PCN reaction that required hospitalization: No  Has patient had a PCN reaction occurring within the last 10 years: No  If all of the above answers are "NO", then may proceed with Cephalosporin use.    Orange Juice [Orange Oil] Rash    rash    REVIEW OF SYSTEMS:  Pertinent items are noted in HPI.   FAMILY HISTORY:   Family History  Problem Relation Age of Onset   Hyperlipidemia Mother    Rheum arthritis Sister    Osteoarthritis Sister    Fibroids Sister    Fibroids Sister    Fibroids Sister    Heart disease Maternal Aunt    Stroke Maternal Aunt    Diabetes Maternal Aunt    Heart disease Maternal Uncle    Stroke Maternal Uncle    Rheum arthritis Nephew    Cancer Neg Hx     SOCIAL HISTORY:   Social History   Tobacco Use   Smoking status: Never    Passive exposure: Current   Smokeless tobacco: Never  Substance Use Topics   Alcohol use: Yes    Comment: social     EXAMINATION:  Vital signs in last 24 hours: '@VSRANGES'$ @  General appearance: alert, cooperative, and no distress  Neck: no JVD and supple, symmetrical, trachea midline Lungs: clear to auscultation bilaterally Heart: regular rate and rhythm, S1, S2 normal, no murmur, click, rub or gallop Abdomen: soft, non-tender; bowel sounds normal; no masses,  no organomegaly Extremities: extremities normal, atraumatic, no cyanosis or edema and Homans sign is negative, no sign of DVT Pulses: 2+ and symmetric Skin: Skin color, texture, turgor normal. No rashes or lesions Neurologic: Alert and oriented X 3, normal strength and tone. Normal symmetric reflexes. Normal  coordination and gait  Musculoskeletal:  ROM 0-10, Ligaments intact,  Imaging Review Plain radiographs demonstrate severe degenerative joint disease of the left knee. The overall alignment is mild varus. The bone quality appears to be good for age and reported activity level.  Assessment/Plan: Primary osteoarthritis, left knee   The patient history, physical examination and imaging studies are consistent with advanced degenerative joint disease of the left knee. The patient has failed conservative treatment.  The clearance notes were reviewed.  After discussion with the patient it was felt that Partial Knee Replacement was indicated. The procedure,  risks, and benefits of total knee arthroplasty were presented and reviewed. The risks including but not limited to aseptic loosening, infection, blood clots, vascular injury, stiffness, patella tracking problems complications among others were discussed. The patient acknowledged the explanation, agreed to proceed with the plan.  Carlynn Spry 05/22/2022, 6:40 PM

## 2022-05-22 NOTE — H&P (Deleted)
  The note originally documented on this encounter has been moved the the encounter in which it belongs.  

## 2022-05-22 NOTE — Patient Instructions (Signed)
Standing Labs We placed an order today for your standing lab work.   Please have your standing labs drawn in 2 weeks x2, 2 months, and every 3 months AFTER starting on Methotrexate   Please have your labs drawn 2 weeks prior to your appointment so that the provider can discuss your lab results at your appointment.  Please note that you may see your imaging and lab results in [place] before we have reviewed them. We will contact you once all results are reviewed. Please allow our office up to 72 hours to thoroughly review all of the results before contacting the office for clarification of your results.  Lab hours are:   Monday through Thursday from 8:00 am -12:30 pm and 1:00 pm-5:00 pm and Friday from 8:00 am-12:00 pm.  Please be advised, all patients with office appointments requiring lab work will take precedent over walk-in lab work.   Labs are drawn by [facility]. Please bring your co-pay at the time of your lab draw.  You may receive a bill from [facility] for your lab work.  Please note if you are on Hydroxychloroquine and and an order has been placed for a Hydroxychloroquine level, you will need to have it drawn 4 hours or more after your last dose.  If you wish to have your labs drawn at another location, please call the office 24 hours in advance so we can fax the orders.  The office is located at [address], [place], [place], [place] [address] No appointment is necessary.    If you have any questions regarding directions or hours of operation,  please call [phone].   As a reminder, please drink plenty of water prior to coming for your lab work. Thanks!

## 2022-05-22 NOTE — Telephone Encounter (Signed)
Patient has an appointment this afternoon. Will clarify dose at that time.

## 2022-05-27 ENCOUNTER — Other Ambulatory Visit: Payer: Commercial Managed Care - HMO

## 2022-05-27 ENCOUNTER — Encounter
Admission: RE | Admit: 2022-05-27 | Discharge: 2022-05-27 | Disposition: A | Discharge status: Home / Self Care | Payer: Commercial Managed Care - HMO | Source: Ambulatory Visit | Attending: Orthopedic Surgery | Admitting: Orthopedic Surgery

## 2022-05-27 VITALS — BP 132/87 | HR 94 | Resp 16 | Ht 65.0 in | Wt 273.4 lb

## 2022-05-27 DIAGNOSIS — Z01812 Encounter for preprocedural laboratory examination: Secondary | ICD-10-CM | POA: Diagnosis not present

## 2022-05-27 DIAGNOSIS — Z01818 Encounter for other preprocedural examination: Secondary | ICD-10-CM

## 2022-05-27 HISTORY — DX: Other specified postprocedural states: Z98.890

## 2022-05-27 HISTORY — DX: Anxiety disorder, unspecified: F41.9

## 2022-05-27 HISTORY — DX: Prediabetes: R73.03

## 2022-05-27 HISTORY — DX: Anemia, unspecified: D64.9

## 2022-05-27 HISTORY — DX: Morbid (severe) obesity due to excess calories: E66.01

## 2022-05-27 HISTORY — DX: Gastro-esophageal reflux disease without esophagitis: K21.9

## 2022-05-27 HISTORY — DX: Sjogren syndrome, unspecified: M35.00

## 2022-05-27 HISTORY — DX: Fibromyalgia: M79.7

## 2022-05-27 HISTORY — DX: Nausea with vomiting, unspecified: R11.2

## 2022-05-27 LAB — URINALYSIS, ROUTINE W REFLEX MICROSCOPIC
Bacteria, UA: NONE SEEN
Bilirubin Urine: NEGATIVE
Glucose, UA: NEGATIVE mg/dL
Hgb urine dipstick: NEGATIVE
Ketones, ur: NEGATIVE mg/dL
Leukocytes,Ua: NEGATIVE
Nitrite: NEGATIVE
Protein, ur: NEGATIVE mg/dL
Specific Gravity, Urine: 1.031 — ABNORMAL HIGH (ref 1.005–1.030)
pH: 5 (ref 5.0–8.0)

## 2022-05-27 LAB — SURGICAL PCR SCREEN
MRSA, PCR: NEGATIVE
Staphylococcus aureus: POSITIVE — AB

## 2022-05-27 NOTE — Patient Instructions (Addendum)
Your procedure is scheduled on:06-03-22 Tuesday Report to the Registration Desk on the 1st floor of the [facility].Then proceed to the 2nd floor Surgery Desk To find out your arrival time, please call [phone] between 1PM - 3PM on:06-02-22 Monday If your arrival time is 6:00 am, do not arrive prior to that time as the [facility] entrance doors do not open until 6:00 am.  REMEMBER: Instructions that are not followed completely may result in serious medical risk, up to and including death; or upon the discretion of your surgeon and anesthesiologist your surgery may need to be rescheduled.  Do not eat food OR drink any liquids after midnight the night before surgery.  No gum chewing, lozengers or hard candies.  TAKE THESE MEDICATIONS THE MORNING OF SURGERY WITH A SIP OF WATER: -pantoprazole (PROTONIX)   One week prior to surgery: Stop Anti-inflammatories (NSAIDS) such as Advil, Aleve, Ibuprofen, Motrin, Naproxen, Naprosyn and Aspirin based products such as Excedrin, Goodys Powder, BC Powder.You may however, take Tylenol if needed for pain up until the day of surgery.  Stop ANY OVER THE COUNTER supplements/vitamins NOW (05-27-22) until after surgery.  No Alcohol for 24 hours before or after surgery.  No Smoking including e-cigarettes for 24 hours prior to surgery.  No chewable tobacco products for at least 6 hours prior to surgery.  No nicotine patches on the day of surgery.  Do not use any "recreational" drugs for at least a week prior to your surgery.  Please be advised that the combination of cocaine and anesthesia may have negative outcomes, up to and including death. If you test positive for cocaine, your surgery will be cancelled.  On the morning of surgery brush your teeth with toothpaste and water, you may rinse your mouth with mouthwash if you wish. Do not swallow any toothpaste or mouthwash.  Use CHG Soap as directed on instruction sheet.  Do not wear jewelry,  make-up, hairpins, clips or nail polish.  Do not wear lotions, powders, or perfumes.   Do not shave body from the neck down 48 hours prior to surgery just in case you cut yourself which could leave a site for infection.  Also, freshly shaved skin may become irritated if using the CHG soap.  Contact lenses, hearing aids and dentures may not be worn into surgery.  Do not bring valuables to the hospital. [facility] is not responsible for any missing/lost belongings or valuables.   Notify your doctor if there is any change in your medical condition (cold, fever, infection).  Wear comfortable clothing (specific to your surgery type) to the hospital.  After surgery, you can help prevent lung complications by doing breathing exercises.  Take deep breaths and cough every 1-2 hours. Your doctor may order a device called an Incentive Spirometer to help you take deep breaths. When coughing or sneezing, hold a pillow firmly against your incision with both hands. This is called "splinting." Doing this helps protect your incision. It also decreases belly discomfort.  If you are being admitted to the hospital overnight, leave your suitcase in the car. After surgery it may be brought to your room.  If you are being discharged the day of surgery, you will not be allowed to drive home. You will need a responsible adult (18 years or older) to drive you home and stay with you that night.   If you are taking public transportation, you will need to have a responsible adult (18 years or older) with you. Please  confirm with your physician that it is acceptable to use public transportation.   Please call the [facility] Dept. at [phone] if you have any questions about these instructions.  Surgery Visitation Policy:  Patients undergoing a surgery or procedure may have two family members or support persons with them as long as the person is not COVID-19 positive or experiencing its symptoms.    Inpatient Visitation:    Visiting hours are 7 a.m. to 8 p.m. Up to four visitors are allowed at one time in a patient room. The visitors may rotate out with other people during the day. One designated support person (adult) may remain overnight.  Due to an increase in RSV and influenza rates and associated hospitalizations, children ages 19 and under will not be able to visit patients in [facility]. Masks continue to be strongly recommended.   How to Use an Incentive Spirometer An incentive spirometer is a tool that measures how well you are filling your lungs with each breath. Learning to take long, deep breaths using this tool can help you keep your lungs clear and active. This may help to reverse or lessen your chance of developing breathing (pulmonary) problems, especially infection. You may be asked to use a spirometer: After a surgery. If you have a lung problem or a history of smoking. After a long period of time when you have been unable to move or be active. If the spirometer includes an indicator to show the highest number that you have reached, your health care provider or respiratory therapist will help you set a goal. Keep a log of your progress as told by your health care provider. What are the risks? Breathing too quickly may cause dizziness or cause you to pass out. Take your time so you do not get dizzy or light-headed. If you are in pain, you may need to take pain medicine before doing incentive spirometry. It is harder to take a deep breath if you are having pain. How to use your incentive spirometer  Sit up on the edge of your bed or on a chair. Hold the incentive spirometer so that it is in an upright position. Before you use the spirometer, breathe out normally. Place the mouthpiece in your mouth. Make sure your lips are closed tightly around it. Breathe in slowly and as deeply as you can through your mouth, causing the piston or the ball to rise toward  the top of the chamber. Hold your breath for 3-5 seconds, or for as long as possible. If the spirometer includes a coach indicator, use this to guide you in breathing. Slow down your breathing if the indicator goes above the marked areas. Remove the mouthpiece from your mouth and breathe out normally. The piston or ball will return to the bottom of the chamber. Rest for a few seconds, then repeat the steps 10 or more times. Take your time and take a few normal breaths between deep breaths so that you do not get dizzy or light-headed. Do this every 1-2 hours when you are awake. If the spirometer includes a goal marker to show the highest number you have reached (best effort), use this as a goal to work toward during each repetition. After each set of 10 deep breaths, cough a few times. This will help to make sure that your lungs are clear. If you have an incision on your chest or abdomen from surgery, place a pillow or a rolled-up towel firmly against the incision  when you cough. This can help to reduce pain while taking deep breaths and coughing. General tips When you are able to get out of bed: Walk around often. Continue to take deep breaths and cough in order to clear your lungs. Keep using the incentive spirometer until your health care provider says it is okay to stop using it. If you have been in the hospital, you may be told to keep using the spirometer at home. Contact a health care provider if: You are having difficulty using the spirometer. You have trouble using the spirometer as often as instructed. Your pain medicine is not giving enough relief for you to use the spirometer as told. You have a fever. Get help right away if: You develop shortness of breath. You develop a cough with bloody mucus from the lungs. You have fluid or blood coming from an incision site after you cough. Summary An incentive spirometer is a tool that can help you learn to take long, deep breaths to keep  your lungs clear and active. You may be asked to use a spirometer after a surgery, if you have a lung problem or a history of smoking, or if you have been inactive for a long period of time. Use your incentive spirometer as instructed every 1-2 hours while you are awake. If you have an incision on your chest or abdomen, place a pillow or a rolled-up towel firmly against your incision when you cough. This will help to reduce pain. Get help right away if you have shortness of breath, you cough up bloody mucus, or blood comes from your incision when you cough. This information is not intended to replace advice given to you by your health care provider. Make sure you discuss any questions you have with your health care provider. Document Revised: 08/22/2019 Document Reviewed: 08/22/2019 Elsevier Patient Education  [place].

## 2022-06-03 ENCOUNTER — Other Ambulatory Visit: Payer: Self-pay

## 2022-06-03 ENCOUNTER — Encounter
Admission: RE | Disposition: A | Discharge status: Home / Self Care | Payer: Self-pay | Source: Home / Self Care | Attending: Orthopedic Surgery

## 2022-06-03 ENCOUNTER — Ambulatory Visit: Payer: Commercial Managed Care - HMO | Admitting: Urgent Care

## 2022-06-03 ENCOUNTER — Ambulatory Visit: Payer: Commercial Managed Care - HMO | Admitting: Certified Registered"

## 2022-06-03 ENCOUNTER — Ambulatory Visit: Payer: Commercial Managed Care - HMO

## 2022-06-03 ENCOUNTER — Observation Stay
Admission: RE | Admit: 2022-06-03 | Discharge: 2022-06-04 | Disposition: A | Discharge status: Home / Self Care | Payer: Commercial Managed Care - HMO | Attending: Orthopedic Surgery | Admitting: Orthopedic Surgery

## 2022-06-03 ENCOUNTER — Encounter: Payer: Self-pay | Admitting: Orthopedic Surgery

## 2022-06-03 DIAGNOSIS — Z96652 Presence of left artificial knee joint: Secondary | ICD-10-CM | POA: Diagnosis present

## 2022-06-03 DIAGNOSIS — M1712 Unilateral primary osteoarthritis, left knee: Principal | ICD-10-CM | POA: Insufficient documentation

## 2022-06-03 HISTORY — PX: TOTAL KNEE ARTHROPLASTY: SHX125

## 2022-06-03 SURGERY — ARTHROPLASTY, KNEE, TOTAL
Anesthesia: Spinal | Site: Knee | Laterality: Left

## 2022-06-03 MED ORDER — GLYCOPYRROLATE 0.2 MG/ML IJ SOLN
INTRAMUSCULAR | Status: AC
  Filled 2022-06-03: qty 1

## 2022-06-03 MED ORDER — KETOROLAC TROMETHAMINE 15 MG/ML IJ SOLN
15.0000 mg | Freq: Four times a day (QID) | INTRAMUSCULAR | Status: AC
  Administered 2022-06-03 – 2022-06-04 (×3): 15 mg via INTRAVENOUS

## 2022-06-03 MED ORDER — ACETAMINOPHEN 325 MG PO TABS: 325.0000 mg | ORAL_TABLET | Freq: Four times a day (QID) | ORAL | Status: DC | PRN

## 2022-06-03 MED ORDER — ONDANSETRON HCL 4 MG/2ML IJ SOLN
INTRAMUSCULAR | Status: DC | PRN
  Administered 2022-06-03: 4 mg via INTRAVENOUS

## 2022-06-03 MED ORDER — MORPHINE SULFATE (PF) 4 MG/ML IV SOLN: 0.5000 mg | INTRAVENOUS | Status: DC | PRN

## 2022-06-03 MED ORDER — FENTANYL CITRATE (PF) 100 MCG/2ML IJ SOLN
25.0000 ug | INTRAMUSCULAR | Status: DC | PRN
  Administered 2022-06-03 (×4): 25 ug via INTRAVENOUS

## 2022-06-03 MED ORDER — VANCOMYCIN HCL IN DEXTROSE 1-5 GM/200ML-% IV SOLN
1000.0000 mg | INTRAVENOUS | Status: AC
  Administered 2022-06-03: 1000 mg via INTRAVENOUS

## 2022-06-03 MED ORDER — ASPIRIN 81 MG PO CHEW
CHEWABLE_TABLET | ORAL | Status: AC
  Administered 2022-06-03: 81 mg via ORAL
  Filled 2022-06-03: qty 1

## 2022-06-03 MED ORDER — FENTANYL CITRATE (PF) 100 MCG/2ML IJ SOLN
INTRAMUSCULAR | Status: AC
  Filled 2022-06-03: qty 2

## 2022-06-03 MED ORDER — LACTATED RINGERS IV SOLN: INTRAVENOUS | Status: DC

## 2022-06-03 MED ORDER — KETOROLAC TROMETHAMINE 15 MG/ML IJ SOLN
INTRAMUSCULAR | Status: AC
  Filled 2022-06-03: qty 1

## 2022-06-03 MED ORDER — CHLORHEXIDINE GLUCONATE 0.12 % MT SOLN: 15.0000 mL | Freq: Once | OROMUCOSAL | Status: AC

## 2022-06-03 MED ORDER — PHENYLEPHRINE 80 MCG/ML (10ML) SYRINGE FOR IV PUSH (FOR BLOOD PRESSURE SUPPORT)
PREFILLED_SYRINGE | INTRAVENOUS | Status: AC
  Filled 2022-06-03: qty 10

## 2022-06-03 MED ORDER — HYDROCODONE-ACETAMINOPHEN 5-325 MG PO TABS
ORAL_TABLET | ORAL | Status: AC
  Filled 2022-06-03: qty 1

## 2022-06-03 MED ORDER — BUPIVACAINE-EPINEPHRINE (PF) 0.5% -1:200000 IJ SOLN
INTRAMUSCULAR | Status: AC
  Filled 2022-06-03: qty 30

## 2022-06-03 MED ORDER — MENTHOL 3 MG MT LOZG: 1.0000 | LOZENGE | OROMUCOSAL | Status: DC | PRN

## 2022-06-03 MED ORDER — MIDAZOLAM HCL 5 MG/5ML IJ SOLN
INTRAMUSCULAR | Status: DC | PRN
  Administered 2022-06-03 (×2): 1 mg via INTRAVENOUS

## 2022-06-03 MED ORDER — PHENYLEPHRINE HCL-NACL 20-0.9 MG/250ML-% IV SOLN
INTRAVENOUS | Status: DC | PRN
  Administered 2022-06-03: 20 ug/min via INTRAVENOUS

## 2022-06-03 MED ORDER — PHENYLEPHRINE 80 MCG/ML (10ML) SYRINGE FOR IV PUSH (FOR BLOOD PRESSURE SUPPORT)
PREFILLED_SYRINGE | INTRAVENOUS | Status: DC | PRN
  Administered 2022-06-03 (×2): 80 ug via INTRAVENOUS

## 2022-06-03 MED ORDER — ONDANSETRON HCL 4 MG/2ML IJ SOLN
INTRAMUSCULAR | Status: AC
  Filled 2022-06-03: qty 2

## 2022-06-03 MED ORDER — HYDROCODONE-ACETAMINOPHEN 5-325 MG PO TABS
ORAL_TABLET | ORAL | Status: AC
  Filled 2022-06-03: qty 2

## 2022-06-03 MED ORDER — MAGNESIUM CITRATE PO SOLN: 1.0000 | Freq: Once | ORAL | Status: DC | PRN

## 2022-06-03 MED ORDER — DOCUSATE SODIUM 100 MG PO CAPS: 100.0000 mg | ORAL_CAPSULE | Freq: Two times a day (BID) | ORAL | Status: DC

## 2022-06-03 MED ORDER — ONDANSETRON HCL 4 MG PO TABS
4.0000 mg | ORAL_TABLET | Freq: Four times a day (QID) | ORAL | Status: DC | PRN
  Administered 2022-06-04: 4 mg via ORAL

## 2022-06-03 MED ORDER — BUPIVACAINE HCL (PF) 0.5 % IJ SOLN
INTRAMUSCULAR | Status: DC | PRN
  Administered 2022-06-03: 10 mL via PERINEURAL

## 2022-06-03 MED ORDER — ORAL CARE MOUTH RINSE: 15.0000 mL | Freq: Once | OROMUCOSAL | Status: AC

## 2022-06-03 MED ORDER — BUPIVACAINE LIPOSOME 1.3 % IJ SUSP
INTRAMUSCULAR | Status: DC | PRN
  Administered 2022-06-03: 10 mL via PERINEURAL

## 2022-06-03 MED ORDER — GLYCOPYRROLATE 0.2 MG/ML IJ SOLN
INTRAMUSCULAR | Status: DC | PRN
  Administered 2022-06-03: .1 mg via INTRAVENOUS

## 2022-06-03 MED ORDER — MIDAZOLAM HCL 2 MG/2ML IJ SOLN: 2.0000 mg | Freq: Once | INTRAMUSCULAR | Status: AC

## 2022-06-03 MED ORDER — PROPOFOL 10 MG/ML IV BOLUS
INTRAVENOUS | Status: DC | PRN
  Administered 2022-06-03: 40 mg via INTRAVENOUS

## 2022-06-03 MED ORDER — LIDOCAINE HCL (PF) 1 % IJ SOLN
INTRAMUSCULAR | Status: AC
  Filled 2022-06-03: qty 5

## 2022-06-03 MED ORDER — CEFAZOLIN SODIUM-DEXTROSE 2-4 GM/100ML-% IV SOLN
2.0000 g | Freq: Four times a day (QID) | INTRAVENOUS | Status: AC
  Administered 2022-06-04: 2 g via INTRAVENOUS

## 2022-06-03 MED ORDER — LIDOCAINE HCL (PF) 1 % IJ SOLN
INTRAMUSCULAR | Status: DC | PRN
  Administered 2022-06-03: 3 mL via SUBCUTANEOUS

## 2022-06-03 MED ORDER — METOCLOPRAMIDE HCL 5 MG PO TABS: 5.0000 mg | ORAL_TABLET | Freq: Three times a day (TID) | ORAL | Status: DC | PRN

## 2022-06-03 MED ORDER — PHENOL 1.4 % MT LIQD: 1.0000 | OROMUCOSAL | Status: DC | PRN

## 2022-06-03 MED ORDER — OXYCODONE HCL 5 MG PO TABS
ORAL_TABLET | ORAL | Status: AC
  Filled 2022-06-03: qty 1

## 2022-06-03 MED ORDER — BISACODYL 10 MG RE SUPP: 10.0000 mg | Freq: Every day | RECTAL | Status: DC | PRN

## 2022-06-03 MED ORDER — DIPHENHYDRAMINE HCL 12.5 MG/5ML PO ELIX: 12.5000 mg | ORAL_SOLUTION | ORAL | Status: DC | PRN

## 2022-06-03 MED ORDER — PHENYLEPHRINE HCL (PRESSORS) 10 MG/ML IV SOLN
INTRAVENOUS | Status: AC
  Filled 2022-06-03: qty 1

## 2022-06-03 MED ORDER — ASPIRIN 81 MG PO CHEW: 81.0000 mg | CHEWABLE_TABLET | Freq: Two times a day (BID) | ORAL | Status: DC

## 2022-06-03 MED ORDER — PROPOFOL 500 MG/50ML IV EMUL
INTRAVENOUS | Status: DC | PRN
  Administered 2022-06-03: 50 ug/kg/min via INTRAVENOUS

## 2022-06-03 MED ORDER — PROPOFOL 1000 MG/100ML IV EMUL
INTRAVENOUS | Status: AC
  Filled 2022-06-03: qty 100

## 2022-06-03 MED ORDER — PANTOPRAZOLE SODIUM 40 MG PO TBEC
DELAYED_RELEASE_TABLET | ORAL | Status: AC
  Administered 2022-06-03: 40 mg via ORAL
  Filled 2022-06-03: qty 1

## 2022-06-03 MED ORDER — MIDAZOLAM HCL 2 MG/2ML IJ SOLN
INTRAMUSCULAR | Status: AC
  Administered 2022-06-03: 1 mg via INTRAVENOUS
  Filled 2022-06-03: qty 2

## 2022-06-03 MED ORDER — HYDROCODONE-ACETAMINOPHEN 7.5-325 MG PO TABS
1.0000 | ORAL_TABLET | ORAL | Status: DC | PRN
  Filled 2022-06-03: qty 2

## 2022-06-03 MED ORDER — ONDANSETRON HCL 4 MG/2ML IJ SOLN: 4.0000 mg | Freq: Once | INTRAMUSCULAR | Status: DC | PRN

## 2022-06-03 MED ORDER — BUPIVACAINE HCL (PF) 0.5 % IJ SOLN
INTRAMUSCULAR | Status: AC
  Filled 2022-06-03: qty 10

## 2022-06-03 MED ORDER — IRRISEPT - 450ML BOTTLE WITH 0.05% CHG IN STERILE WATER, USP 99.95% OPTIME
TOPICAL | Status: DC | PRN
  Administered 2022-06-03: 450 mL

## 2022-06-03 MED ORDER — EPHEDRINE 5 MG/ML INJ
INTRAVENOUS | Status: AC
  Filled 2022-06-03: qty 5

## 2022-06-03 MED ORDER — METOCLOPRAMIDE HCL 5 MG/ML IJ SOLN: 5.0000 mg | Freq: Three times a day (TID) | INTRAMUSCULAR | Status: DC | PRN

## 2022-06-03 MED ORDER — HYDROCODONE-ACETAMINOPHEN 5-325 MG PO TABS
1.0000 | ORAL_TABLET | ORAL | Status: DC | PRN
  Administered 2022-06-03: 1 via ORAL
  Administered 2022-06-04 (×2): 2 via ORAL

## 2022-06-03 MED ORDER — SODIUM CHLORIDE 0.9 % IR SOLN
Status: DC | PRN
  Administered 2022-06-03: 3000 mL

## 2022-06-03 MED ORDER — FAMOTIDINE 20 MG PO TABS: 20.0000 mg | ORAL_TABLET | Freq: Two times a day (BID) | ORAL | Status: DC | PRN

## 2022-06-03 MED ORDER — LORATADINE 10 MG PO TABS: 10.0000 mg | ORAL_TABLET | Freq: Every day | ORAL | Status: DC

## 2022-06-03 MED ORDER — 0.9 % SODIUM CHLORIDE (POUR BTL) OPTIME
TOPICAL | Status: DC | PRN
  Administered 2022-06-03: 500 mL

## 2022-06-03 MED ORDER — EPHEDRINE SULFATE (PRESSORS) 50 MG/ML IJ SOLN
INTRAMUSCULAR | Status: DC | PRN
  Administered 2022-06-03 (×2): 5 mg via INTRAVENOUS

## 2022-06-03 MED ORDER — POVIDONE-IODINE 10 % EX SWAB
2.0000 | Freq: Once | CUTANEOUS | Status: AC
  Administered 2022-06-03: 2 via TOPICAL

## 2022-06-03 MED ORDER — CHLORHEXIDINE GLUCONATE 0.12 % MT SOLN
OROMUCOSAL | Status: AC
  Administered 2022-06-03: 15 mL via OROMUCOSAL
  Filled 2022-06-03: qty 15

## 2022-06-03 MED ORDER — PHENYLEPHRINE HCL-NACL 20-0.9 MG/250ML-% IV SOLN
INTRAVENOUS | Status: AC
  Filled 2022-06-03: qty 250

## 2022-06-03 MED ORDER — TRANEXAMIC ACID-NACL 1000-0.7 MG/100ML-% IV SOLN
INTRAVENOUS | Status: AC
  Filled 2022-06-03: qty 100

## 2022-06-03 MED ORDER — ALUM & MAG HYDROXIDE-SIMETH 200-200-20 MG/5ML PO SUSP: 30.0000 mL | ORAL | Status: DC | PRN

## 2022-06-03 MED ORDER — PROPOFOL 10 MG/ML IV BOLUS
INTRAVENOUS | Status: AC
  Filled 2022-06-03: qty 20

## 2022-06-03 MED ORDER — HYDROCODONE-ACETAMINOPHEN 5-325 MG PO TABS
ORAL_TABLET | ORAL | Status: AC
  Administered 2022-06-03: 2 via ORAL
  Filled 2022-06-03: qty 2

## 2022-06-03 MED ORDER — TRANEXAMIC ACID-NACL 1000-0.7 MG/100ML-% IV SOLN
1000.0000 mg | INTRAVENOUS | Status: AC
  Administered 2022-06-03: 1000 mg via INTRAVENOUS

## 2022-06-03 MED ORDER — MIDAZOLAM HCL 2 MG/2ML IJ SOLN
INTRAMUSCULAR | Status: AC
  Filled 2022-06-03: qty 2

## 2022-06-03 MED ORDER — LIDOCAINE HCL (CARDIAC) PF 100 MG/5ML IV SOSY
PREFILLED_SYRINGE | INTRAVENOUS | Status: DC | PRN
  Administered 2022-06-03: 30 mg via INTRAVENOUS

## 2022-06-03 MED ORDER — DOCUSATE SODIUM 100 MG PO CAPS
ORAL_CAPSULE | ORAL | Status: AC
  Administered 2022-06-03: 100 mg via ORAL
  Filled 2022-06-03: qty 1

## 2022-06-03 MED ORDER — VANCOMYCIN HCL IN DEXTROSE 1-5 GM/200ML-% IV SOLN
INTRAVENOUS | Status: AC
  Filled 2022-06-03: qty 200

## 2022-06-03 MED ORDER — BUPIVACAINE LIPOSOME 1.3 % IJ SUSP
INTRAMUSCULAR | Status: AC
  Filled 2022-06-03: qty 10

## 2022-06-03 MED ORDER — CEFAZOLIN SODIUM-DEXTROSE 2-4 GM/100ML-% IV SOLN
INTRAVENOUS | Status: AC
  Administered 2022-06-03: 2 g via INTRAVENOUS
  Filled 2022-06-03: qty 100

## 2022-06-03 MED ORDER — LIDOCAINE HCL (PF) 2 % IJ SOLN
INTRAMUSCULAR | Status: AC
  Filled 2022-06-03: qty 5

## 2022-06-03 MED ORDER — ONDANSETRON HCL 4 MG/2ML IJ SOLN: 4.0000 mg | Freq: Four times a day (QID) | INTRAMUSCULAR | Status: DC | PRN

## 2022-06-03 MED ORDER — PANTOPRAZOLE SODIUM 40 MG PO TBEC: 40.0000 mg | DELAYED_RELEASE_TABLET | Freq: Two times a day (BID) | ORAL | Status: DC

## 2022-06-03 SURGICAL SUPPLY — 55 items
APL PRP STRL LF DISP 70% ISPRP (MISCELLANEOUS) ×2
BASEPLATE TIBIA LT MEDIAL 6 (Plate) IMPLANT
BLADE SAGITTAL AGGR TOOTH XLG (BLADE) ×1 IMPLANT
BLADE SAW 70X12.5 (BLADE) IMPLANT
BLADE SAW SAG 25X90X1.19 (BLADE) ×1 IMPLANT
BLADE SAW SGTL 13X75X1.27 (BLADE) IMPLANT
BOWL CEMENT MIX W/ADAPTER (MISCELLANEOUS) ×1 IMPLANT
BRUSH SCRUB EZ  4% CHG (MISCELLANEOUS) ×1
BRUSH SCRUB EZ 4% CHG (MISCELLANEOUS) ×1 IMPLANT
BSPLAT TIB 6 UNCMP STRL LF KN (Plate) ×1 IMPLANT
CEMENT GMV SMARTSET GENT 40G (Cement) IMPLANT
CHLORAPREP W/TINT 26 (MISCELLANEOUS) ×2 IMPLANT
COMP FEM UNI JOUR LM/RL 4 (Joint) ×1 IMPLANT
COMPONENT FEM UNI JOUR LM/RL 4 (Joint) IMPLANT
COOLER POLAR GLACIER W/PUMP (MISCELLANEOUS) ×1 IMPLANT
CUFF TOURN SGL QUICK 34 (TOURNIQUET CUFF) ×1
CUFF TRNQT CYL 34X4.125X (TOURNIQUET CUFF) ×1 IMPLANT
DRAPE 3/4 80X56 (DRAPES) ×2 IMPLANT
DRAPE INCISE IOBAN 66X60 STRL (DRAPES) ×1 IMPLANT
ELECT REM PT RETURN 9FT ADLT (ELECTROSURGICAL) ×1
ELECTRODE REM PT RTRN 9FT ADLT (ELECTROSURGICAL) ×1 IMPLANT
GAUZE SPONGE 4X4 12PLY STRL (GAUZE/BANDAGES/DRESSINGS) ×1 IMPLANT
GAUZE XEROFORM 1X8 LF (GAUZE/BANDAGES/DRESSINGS) ×1 IMPLANT
GLOVE BIO SURGEON STRL SZ8 (GLOVE) ×2 IMPLANT
GLOVE BIOGEL PI IND STRL 8.5 (GLOVE) ×2 IMPLANT
GLOVE PI ORTHO PRO STRL SZ8 (GLOVE) ×2 IMPLANT
GOWN STRL REUS W/ TWL XL LVL3 (GOWN DISPOSABLE) ×2 IMPLANT
GOWN STRL REUS W/TWL XL LVL3 (GOWN DISPOSABLE) ×2
HOOD PEEL AWAY T7 (MISCELLANEOUS) ×3 IMPLANT
IV NS IRRIG 3000ML ARTHROMATIC (IV SOLUTION) ×1 IMPLANT
KIT TURNOVER KIT A (KITS) ×1 IMPLANT
LINER TIB UNI JOUR 5-6/8 (Liner) IMPLANT
MANIFOLD NEPTUNE II (INSTRUMENTS) ×1 IMPLANT
MAT ABSORB  FLUID 56X50 GRAY (MISCELLANEOUS) ×1
MAT ABSORB FLUID 56X50 GRAY (MISCELLANEOUS) ×1 IMPLANT
NDL SPNL 20GX3.5 QUINCKE YW (NEEDLE) ×1 IMPLANT
NEEDLE SPNL 20GX3.5 QUINCKE YW (NEEDLE) ×1 IMPLANT
NS IRRIG 1000ML POUR BTL (IV SOLUTION) ×1 IMPLANT
PACK TOTAL KNEE (MISCELLANEOUS) ×1 IMPLANT
PAD ABD DERMACEA PRESS 5X9 (GAUZE/BANDAGES/DRESSINGS) ×2 IMPLANT
PAD DE MAYO PRESSURE PROTECT (MISCELLANEOUS) ×1 IMPLANT
PAD WRAPON POLAR KNEE (MISCELLANEOUS) ×1 IMPLANT
PULSAVAC PLUS IRRIG FAN TIP (DISPOSABLE) ×1
STAPLER SKIN PROX 35W (STAPLE) ×1 IMPLANT
SUCTION FRAZIER HANDLE 10FR (MISCELLANEOUS) ×1
SUCTION TUBE FRAZIER 10FR DISP (MISCELLANEOUS) ×1 IMPLANT
SUT DVC 2 QUILL PDO  T11 36X36 (SUTURE) ×1
SUT DVC 2 QUILL PDO T11 36X36 (SUTURE) ×1 IMPLANT
SUT VIC AB 2-0 CT1 18 (SUTURE) ×1 IMPLANT
SUT VIC AB PLUS 45CM 1-MO-4 (SUTURE) ×1 IMPLANT
SYR 30ML LL (SYRINGE) ×3 IMPLANT
TIP FAN IRRIG PULSAVAC PLUS (DISPOSABLE) ×1 IMPLANT
TRAP FLUID SMOKE EVACUATOR (MISCELLANEOUS) ×1 IMPLANT
WATER STERILE IRR 500ML POUR (IV SOLUTION) ×1 IMPLANT
WRAPON POLAR PAD KNEE (MISCELLANEOUS) ×1

## 2022-06-03 NOTE — Transfer of Care (Signed)
Immediate Anesthesia Transfer of Care Note  Patient: [name]  Procedure(s) Performed: Partial KNEE ARTHROPLASTY (Left: Knee)  Patient Location: PACU  Anesthesia Type:Spinal and GA combined with regional for post-op pain  Level of Consciousness: awake, alert , oriented, and patient cooperative  Airway & Oxygen Therapy: Patient Spontanous Breathing and Patient connected to face mask oxygen  Post-op Assessment: Report given to RN and Post -op Vital signs reviewed and stable  Post vital signs: Reviewed and stable  Last Vitals:  Vitals Value Taken Time  BP 131/80 06/03/22 1645  Temp 36.5 C 06/03/22 1615  Pulse 85 06/03/22 1653  Resp 15 06/03/22 1653  SpO2 95 % 06/03/22 1653  Vitals shown include unvalidated device data.  Last Pain:  Vitals:   06/03/22 1650  TempSrc:   PainSc: 5          Complications: No notable events documented.

## 2022-06-03 NOTE — Anesthesia Procedure Notes (Signed)
Anesthesia Regional Block: Adductor canal block   Pre-Anesthetic Checklist: , timeout performed,  Correct Patient, Correct Site, Correct Laterality,  Correct Procedure, Correct Position, site marked,  Risks and benefits discussed,  Surgical consent,  Pre-op evaluation,  At surgeon's request and post-op pain management  Laterality: Left  Prep: chloraprep       Needles:  Injection technique: Single-shot  Needle Type: Echogenic Needle     Needle Length: 10cm  Needle Gauge: 20     Additional Needles:   Procedures:,,,, ultrasound used (permanent image in chart),,    Narrative:  End time: 06/03/2022 1:37 PM Injection made incrementally with aspirations every 5 mL.  Performed by: Personally   Additional Notes: For post op pain per surgeon request.  Risks and Benefits discussed in detail with the patient.  Plan for regional block to assist with postoperative pain management.  Negative aspiration for heme during procedure and no evidence of IV or SA sx.  No pain on injection and tolerated well.  VSST.  Dr. Andree Elk

## 2022-06-03 NOTE — H&P (Signed)
The patient has been re-examined, and the chart reviewed, and there have been no interval changes to the documented history and physical.  Plan a left partial knee today.  Anesthesia is consulted regarding a peripheral nerve block for post-operative pain.  The risks, benefits, and alternatives have been discussed at length, and the patient is willing to proceed.

## 2022-06-03 NOTE — Anesthesia Preprocedure Evaluation (Signed)
Anesthesia Evaluation  Patient identified by MRN, date of birth, ID band Patient awake    Reviewed: Allergy & Precautions, H&P , NPO status , Patient's Chart, lab work & pertinent test results, reviewed documented beta blocker date and time   History of Anesthesia Complications (+) PONV and history of anesthetic complications  Airway Mallampati: III   Neck ROM: full    Dental  (+) Poor Dentition, Teeth Intact   Pulmonary pneumonia, resolved, Patient abstained from smoking.   Pulmonary exam normal        Cardiovascular Exercise Tolerance: Poor negative cardio ROS Normal cardiovascular exam Rhythm:regular Rate:Normal     Neuro/Psych  Headaches PSYCHIATRIC DISORDERS Anxiety Depression     Neuromuscular disease    GI/Hepatic Neg liver ROS,GERD  ,,  Endo/Other    Morbid obesity  Renal/GU negative Renal ROS  negative genitourinary   Musculoskeletal   Abdominal   Peds  Hematology  (+) Blood dyscrasia, anemia   Anesthesia Other Findings Past Medical History: No date: Allergy No date: Anemia     Comment:  h/o No date: Anxiety No date: DDD (degenerative disc disease), lumbar No date: Depression No date: Fibromyalgia No date: GERD (gastroesophageal reflux disease) No date: Morbid obesity ([place]) 01/2022: Pneumonia No date: PONV (postoperative nausea and vomiting) No date: Pre-diabetes No date: Sjogren's syndrome [facility]) Past Surgical History: 2008: ABDOMINAL HYSTERECTOMY     Comment:  LAVH, endometriosis ablation, mccall culdoplasty 08/31/2019: CHOLECYSTECTOMY; N/A     Comment:  Procedure: LAPAROSCOPIC CHOLECYSTECTOMY;  Surgeon:               Olean Ree, MD;  Location: [facility] ORS;  Service:               General;  Laterality: N/A; 2002: DIAGNOSTIC LAPAROSCOPY     Comment:  Hysteroscopy, diagnostic laparoscopy, extensive lysis of              adhesions, endometrial ablation, RSO 2007: RIGHT OOPHORECTOMY      Comment:  with right salpingectomy No date: TUBAL LIGATION BMI    Body Mass Index: 45.49 kg/m     Reproductive/Obstetrics negative OB ROS                             Anesthesia Physical Anesthesia Plan  ASA: 3  Anesthesia Plan: Spinal   Post-op Pain Management: Regional block*   Induction:   PONV Risk Score and Plan: 3 and Midazolam, Propofol infusion, Ondansetron and Scopolamine patch - Pre-op  Airway Management Planned:   Additional Equipment:   Intra-op Plan:   Post-operative Plan:   Informed Consent: I have reviewed the patients History and Physical, chart, labs and discussed the procedure including the risks, benefits and alternatives for the proposed anesthesia with the patient or authorized representative who has indicated his/her understanding and acceptance.     Dental Advisory Given  Plan Discussed with: CRNA  Anesthesia Plan Comments: (I spoke with the patient regarding risks and benefits of post op pain block.  The procedure is per surgeon request and the patient voices understanding of the nature of the injection.  They understand the procedure is to assist with post op pain control and not to eliminate pain. Furthermore, they accept that the procedure has potential risks, including but not limited to: failed block, IV injection, paresthesia or nerve related injury etc. They desire to proceed with the above injection as discussed.  Dr. Andree Elk)       Anesthesia  Quick Evaluation

## 2022-06-03 NOTE — Op Note (Signed)
06/03/2022  4:55 PM  Patient:   [name]  Pre-Op Diagnosis:   Osteoarthritis of medial compartment, left knee.  Post-Op Diagnosis:   Same  Procedure:   Left medial unicondylar knee arthroplasty.  Surgeon:   Kurtis Bushman, MD  Assistant:   Carlynn Spry, PA-C  Anesthesia:   Spinal, Regional block  Findings:   As above.  Complications:   None  EBL:   50 cc  TT:   77 minutes at 275 mmHg  Implants:    4 femoral component, a "6" sized tibial tray, and a 8 mm  insert.  Brief Clinical Note:   The patient is a 56 year old female with severe pain and disability related to her left knee medial pain. The patient presents at this time for a left partial knee replacement.  Procedure:   The patient was brought into the operating room and a spinal placed by the anesthesiologist. The patient was lain in the supine position and the left lower extremity was prepped and draped in standard sterile fashion. After performing a timeout to verify the appropriate surgical site, the limb was exsanguinated with an Esmarch and the tourniquet inflated to 275 mmHg.   A standard anterior approach to the knee was made through an approximately 4 inch incision. The incision was carried down through the subcutaneous tissues to expose the superficial retinaculum. This was split the length the incision and the medial flap elevated sufficiently to expose the medial retinaculum. The medial retinaculum was incised along the medial border of the patella tendon and extended proximally along the medial border of the patella, leaving a 3-4 mm cuff of tissue. The soft tissues were elevated off the anteromedial aspect of the proximal tibia. The anterior portion of the meniscus was removed after performing a subtotal excision of the infrapatellar fat pad. The anterior cruciate ligament was inspected and found to be in excellent condition. There were significant degenerative changes of both the femur and tibia on the  medial side. The medial femoral condyle was sized. The external tibial guide positioned. The lateral tibial pin was placed and using the reciprocating and sagittal saw the medial tibial cut was performed and the wafer passed from the field. The gap was verified with a spacer.   Attention was directed to femoral side. The distal guide was pinned and the cut performed with a sagittal saw. The femur was sized to a 4 and the posterior and chamfer cuts performed.  Attention was redirected to the tibial side. The "6" sized tibial tray was positioned and temporarily secured using the appropriate spiked nail. The trial tibial tray was inserted to be sure that it seated properly. The trials were brought through a range of motion with full extension and flexion to 120 degrees. The ligaments were stable throughout a range of motion. The trials were then removed.  The bony surfaces were prepared for cementing by irrigating them thoroughly with bacitracin saline solution using the jet lavage system before packing them with a dry Ray-Tec sponge. Meanwhile, cement was being mixed on the back table. When the cement was ready, the tibial tray was cemented in first. The excess cement was removed using a cement tools after impacting it into place. Next, the femoral component was impacted into place. Again the excess cement was removed using a freer. The 8 mm spacer was inserted and the knee brought into near full extension while the cement hardened.  Again the knee was placed through a range of motion  with the findings as described above.  The wound was copiously irrigated with sterile saline solution via the jet lavage system before the retinacular layer was reapproximated using #0 Vicryl interrupted sutures. At this point, 1 g of transexemic acid in 10 cc of normal saline was injected intra-articularly. The subcutaneous tissues were closed in two layers using 2-0 Vicryl interrupted sutures before the skin was closed using  staples. A sterile occlusive dressing was applied to the knee before the patient was awakened. The patient was transferred back to his/her hospital bed and returned to the recovery room in satisfactory condition after tolerating the procedure well. A Polar Care device was applied to the knee.  Kurtis Bushman, MD

## 2022-06-03 NOTE — Progress Notes (Signed)
PT Cancellation Note  Patient Details Name: [name] MRN: [id number] DOB: 03-28-1966   Cancelled Treatment:    Reason Eval/Treat Not Completed: Medical issues which prohibited therapy Orders not yet released at this time.  Called PACU to check status, per conversation with nurse (and via nurse the patient) not ready to get up at this time, will plan to initiate PT eval tomorrow AM.   Kreg Shropshire, DPT 06/03/2022, 4:48 PM

## 2022-06-03 NOTE — Anesthesia Procedure Notes (Addendum)
Spinal  Patient location during procedure: OR Start time: 06/03/2022 2:06 PM End time: 06/03/2022 2:15 PM Reason for block: surgical anesthesia Staffing Performed: resident/CRNA  Anesthesiologist: Molli Barrows, MD Resident/CRNA: Adalberto Ill, CRNA Performed by: Adalberto Ill, CRNA Authorized by: Molli Barrows, MD   Preanesthetic Checklist Completed: patient identified, IV checked, site marked, risks and benefits discussed, surgical consent, monitors and equipment checked, pre-op evaluation and timeout performed Spinal Block Patient position: sitting Prep: ChloraPrep Patient monitoring: heart rate, cardiac monitor, continuous pulse ox and blood pressure Approach: midline Location: L3-4 Injection technique: single-shot Needle Needle type: Pencan  Needle gauge: 24 G Needle insertion depth: 9 cm Assessment Events: CSF return Additional Notes Risks benefits alternatives plan and backup plan discussed with patient all questions answered MD and CRNA to patient consented for spinal with GETA backup, to OR, monitors attached vital signs obtained, patient assisted to sitting position appropriate for spinal placement, sterile prep with chloraprep allowed to dry for 3 minutes and dry to touch before starting procedure, sterile gloves sterile procedure technique hairnet and facemask worn by all, L3-4 identified lidocaine 1% PF skin wheal introducer and pencan 24 G inserted one attempt no redirect needed, positive CSF clear, negative heme negative paresthesia, placed 0.5% bupivicaine MPF IT easily barbotage beginning and end, laid patient back patient verbalizes BLE becoming numb tolerated procedure well, sedation started, see note about dosing patient given IT 0.5% bupivicaine MPF 2.75 mL lot [id number] exp 11/26

## 2022-06-04 ENCOUNTER — Encounter: Payer: Self-pay | Admitting: Orthopedic Surgery

## 2022-06-04 DIAGNOSIS — M1712 Unilateral primary osteoarthritis, left knee: Secondary | ICD-10-CM | POA: Diagnosis not present

## 2022-06-04 MED ORDER — DOCUSATE SODIUM 100 MG PO CAPS: 100.0000 mg | ORAL_CAPSULE | Freq: Two times a day (BID) | ORAL | 0 refills | Status: DC

## 2022-06-04 MED ORDER — LORATADINE 10 MG PO TABS
ORAL_TABLET | ORAL | Status: AC
  Administered 2022-06-04: 10 mg via ORAL
  Filled 2022-06-04: qty 1

## 2022-06-04 MED ORDER — HYDROCODONE-ACETAMINOPHEN 5-325 MG PO TABS
ORAL_TABLET | ORAL | Status: AC
  Filled 2022-06-04: qty 2

## 2022-06-04 MED ORDER — KETOROLAC TROMETHAMINE 15 MG/ML IJ SOLN
INTRAMUSCULAR | Status: AC
  Filled 2022-06-04: qty 1

## 2022-06-04 MED ORDER — METHOCARBAMOL 500 MG PO TABS: 500.0000 mg | ORAL_TABLET | Freq: Three times a day (TID) | ORAL | 1 refills | Status: DC | PRN

## 2022-06-04 MED ORDER — DOCUSATE SODIUM 100 MG PO CAPS
ORAL_CAPSULE | ORAL | Status: AC
  Administered 2022-06-04: 100 mg via ORAL
  Filled 2022-06-04: qty 1

## 2022-06-04 MED ORDER — HYDROCODONE-ACETAMINOPHEN 5-325 MG PO TABS
ORAL_TABLET | ORAL | Status: AC
  Administered 2022-06-04: 2 via ORAL
  Filled 2022-06-04: qty 2

## 2022-06-04 MED ORDER — CEFAZOLIN SODIUM-DEXTROSE 2-4 GM/100ML-% IV SOLN
INTRAVENOUS | Status: AC
  Filled 2022-06-04: qty 100

## 2022-06-04 MED ORDER — RIVAROXABAN 10 MG PO TABS: 10.0000 mg | ORAL_TABLET | Freq: Every day | ORAL | 0 refills | Status: DC

## 2022-06-04 MED ORDER — PANTOPRAZOLE SODIUM 40 MG PO TBEC
DELAYED_RELEASE_TABLET | ORAL | Status: AC
  Administered 2022-06-04: 40 mg via ORAL
  Filled 2022-06-04: qty 1

## 2022-06-04 MED ORDER — ONDANSETRON HCL 4 MG PO TABS
ORAL_TABLET | ORAL | Status: AC
  Filled 2022-06-04: qty 1

## 2022-06-04 MED ORDER — KETOROLAC TROMETHAMINE 15 MG/ML IJ SOLN
INTRAMUSCULAR | Status: AC
  Administered 2022-06-04: 15 mg via INTRAVENOUS
  Filled 2022-06-04: qty 1

## 2022-06-04 MED ORDER — HYDROCODONE-ACETAMINOPHEN 5-325 MG PO TABS: 1.0000 | ORAL_TABLET | ORAL | 0 refills | Status: DC | PRN

## 2022-06-04 MED ORDER — ASPIRIN 81 MG PO CHEW
CHEWABLE_TABLET | ORAL | Status: AC
  Administered 2022-06-04: 81 mg via ORAL
  Filled 2022-06-04: qty 1

## 2022-06-04 NOTE — Discharge Summary (Signed)
Physician Discharge Summary  Patient ID: [name] MRN: [id number] DOB/AGE: 1965-06-26 56 y.o.  Admit date: 06/03/2022 Discharge date: 06/04/2022  Admission Diagnoses:  M17.12 Unilateral primary osteoarthritis, left knee History of unicondylar arthroplasty of left knee  Discharge Diagnoses:  M17.12 Unilateral primary osteoarthritis, left knee Principal Problem:   History of unicondylar arthroplasty of left knee   Past Medical History:  Diagnosis Date   Allergy    Anemia    h/o   Anxiety    DDD (degenerative disc disease), lumbar    Depression    Fibromyalgia    GERD (gastroesophageal reflux disease)    Morbid obesity ([place])    Pneumonia 01/2022   PONV (postoperative nausea and vomiting)    Pre-diabetes    Sjogren's syndrome ([place])     Surgeries: Procedure(s): Partial KNEE ARTHROPLASTY on 06/03/2022   Consultants (if any):   Discharged Condition: Improved  Hospital Course: [name] is an 56 y.o. female who was admitted 06/03/2022 with a diagnosis of  M17.12 Unilateral primary osteoarthritis, left knee History of unicondylar arthroplasty of left knee and went to the operating room on 06/03/2022 and underwent the above named procedures.    She was given perioperative antibiotics:  Anti-infectives (From admission, onward)    Start     Dose/Rate Route Frequency Ordered Stop   06/04/22 0240  ceFAZolin (ANCEF) 2-4 GM/100ML-% IVPB       Note to Pharmacy: [place] Calix, [place]ryst: cabinet override      06/04/22 0240 06/04/22 0306   06/03/22 1800  ceFAZolin (ANCEF) IVPB 2g/100 mL premix        2 g 200 mL/hr over 30 Minutes Intravenous Every 6 hours 06/03/22 1635 06/04/22 0319   06/03/22 1124  vancomycin (VANCOCIN) 1-5 GM/200ML-% IVPB       Note to Pharmacy: Herby Abraham W: cabinet override      06/03/22 1124 06/03/22 1435   06/03/22 0600  vancomycin (VANCOCIN) IVPB 1000 mg/200 mL premix        1,000 mg 200 mL/hr over 60 Minutes Intravenous On call to  O.R. 06/03/22 0010 06/03/22 1523     .  She was given sequential compression devices, early ambulation, and xarelto for DVT prophylaxis.  She benefited maximally from the hospital stay and there were no complications.    Recent vital signs:  Vitals:   06/03/22 2103 06/04/22 0600  BP: 110/76 108/68  Pulse: 76 78  Resp: 18 16  Temp: 97.9 F (36.6 C) 98 F (36.7 C)  SpO2: 96% 97%    Recent laboratory studies:  Lab Results  Component Value Date   HGB 12.9 04/17/2022   HGB 12.3 01/15/2022   HGB 13.4 01/15/2022   Lab Results  Component Value Date   WBC 5.1 04/17/2022   PLT 241 04/17/2022   Lab Results  Component Value Date   INR 1.0 08/30/2019   Lab Results  Component Value Date   NA 142 04/17/2022   K 3.9 04/17/2022   CL 109 04/17/2022   CO2 25 04/17/2022   BUN 16 04/17/2022   CREATININE 0.79 04/17/2022   GLUCOSE 133 (H) 04/17/2022    Discharge Medications:   Allergies as of 06/04/2022       Reactions   Gabapentin Other (See Comments)   Hair loss   Hydrochlorothiazide Other (See Comments)   The pt state she couldn't function and felt like she was dying    Lipitor [atorvastatin]    Muscle pain   Penicillins Other (See  Comments)   Childhood allergy Has patient had a PCN reaction causing immediate rash, facial/tongue/throat swelling, SOB or lightheadedness with hypotension: Unknown Has patient had a PCN reaction causing severe rash involving mucus membranes or skin necrosis: Unknown Has patient had a PCN reaction that required hospitalization: No  Has patient had a PCN reaction occurring within the last 10 years: No  If all of the above answers are "NO", then may proceed with Cephalosporin use.   Orange Juice [orange Oil] Rash   rash        Medication List     TAKE these medications    Allegra-D Allergy & Congestion 180-240 MG 24 hr tablet Generic drug: fexofenadine-pseudoephedrine TAKE 1 TABLET BY MOUTH EVERY DAY AS NEEDED What changed:  how to  take this when to take this   diclofenac Sodium 1 % Gel Commonly known as: VOLTAREN APPLY 2 GRAMS TO AFFECTED AREA 4 TIMES A DAY What changed: See the new instructions.   docusate sodium 100 MG capsule Commonly known as: COLACE Take 1 capsule (100 mg total) by mouth 2 (two) times daily.   famotidine 20 MG tablet Commonly known as: PEPCID Take 1 tablet (20 mg total) by mouth 2 (two) times daily as needed for heartburn or indigestion.   HYDROcodone-acetaminophen 5-325 MG tablet Commonly known as: NORCO/VICODIN Take 1 tablet by mouth every 4 (four) hours as needed for moderate pain (pain score 4-6).   hydrOXYzine 25 MG capsule Commonly known as: VISTARIL Take 1 capsule (25 mg total) by mouth daily as needed. What changed: reasons to take this   methocarbamol 500 MG tablet Commonly known as: ROBAXIN Take 500 mg by mouth at bedtime. What changed: Another medication with the same name was added. Make sure you understand how and when to take each.   methocarbamol 500 MG tablet Commonly known as: ROBAXIN Take 1 tablet (500 mg total) by mouth every 8 (eight) hours as needed for muscle spasms. What changed: You were already taking a medication with the same name, and this prescription was added. Make sure you understand how and when to take each.   methotrexate 2.5 MG tablet Commonly known as: RHEUMATREX Take 6 tabs po weekly x 2 weeks. If labs are stable increase to 8 tabs po weekly.  Caution:Chemotherapy. Protect from light.   pantoprazole 40 MG tablet Commonly known as: PROTONIX Take 1 tablet (40 mg total) by mouth 2 (two) times daily. What changed: when to take this   rivaroxaban 10 MG Tabs tablet Commonly known as: XARELTO Take 1 tablet (10 mg total) by mouth daily.   Tylenol 8 Hour Arthritis Pain 650 MG CR tablet Generic drug: acetaminophen Take 1,300 mg by mouth daily.   VITAMIN D PO Take 1 tablet by mouth once a week.               Durable Medical  Equipment  (From admission, onward)           Start     Ordered   06/04/22 0658  For home use only DME 3 n 1  Once        06/04/22 0657   06/04/22 0658  For home use only DME Walker rolling  Once       Question Answer Comment  Walker: With 5 Inch Wheels   Patient needs a walker to treat with the following condition Osteoarthritis of left knee      06/04/22 0657  Diagnostic Studies: [place] OR NERVE BLOCK-IMAGE ONLY [facility])  Result Date: 06/03/2022 There is no interpretation for this exam.  This order is for images obtained during a surgical procedure.  Please See "Surgeries" Tab for more information regarding the procedure.    Disposition: Discharge disposition: 01-Home or Self Care            Signed: Carlynn Spry ,PA-C 06/04/2022, 6:58 AM

## 2022-06-04 NOTE — Discharge Instructions (Signed)
Continue weight bear as tolerated on the left lower extremity.    Elevate the left lower extremity whenever possible and continue the polar care while elevating the extremity. Patient may shower. No bath or submerging the wound.    Take xarelto as directed for blood clot prevention.  Continue to work on knee range of motion exercises at home as instructed by physical therapy. Continue to use a walker for assistance with ambulation until cleared by physical therapy.  Call [phone] with any questions, such as fever > 101.5 degrees, drainage from the wound or shortness of breath.

## 2022-06-04 NOTE — Progress Notes (Signed)
  Subjective:  Patient reports pain as mild to moderate.    Objective:   VITALS:   Vitals:   06/03/22 1745 06/03/22 1800 06/03/22 1830 06/03/22 2103  BP: 136/83 108/77 112/68 110/76  Pulse: 65 69 65 76  Resp: (!) _0 Temp: (!) 97 F (36.1 C)  98.2 F (36.8 C) 97.9 F (36.6 C)  TempSrc:      SpO2: 96% 95% 96% 96%  Weight:      Height:        PHYSICAL EXAM:  Neurologically intact ABD soft Neurovascular intact Sensation intact distally Intact pulses distally Dorsiflexion/Plantar flexion intact Incision: dressing C/D/I No cellulitis present Compartment soft  LABS  No results found for this or any previous visit (from the past 24 hour(s)).  [place] OR NERVE BLOCK-IMAGE ONLY [facility])  Result Date: 06/03/2022 There is no interpretation for this exam.  This order is for images obtained during a surgical procedure.  Please See "Surgeries" Tab for more information regarding the procedure.    Assessment/Plan: 1 Day Post-Op   Principal Problem:   History of unicondylar arthroplasty of left knee   Advance diet Up with therapy Discharge home if PT goals met    Carlynn Spry , PA-C 06/04/2022, 6:54 AM

## 2022-06-04 NOTE — TOC Progression Note (Signed)
Transition of Care [facility]) - Progression Note    Patient Details  Name: [name] MRN: [id number] Date of Birth: 20-Jun-1965  Transition of Care [facility]) CM/SW [place], RN Phone Number: 06/04/2022, 10:26 AM  Clinical Narrative:    Will need a Rolling walker and 3 in1, [employer] to Deliver to the bedside     Barriers to Discharge: No Barriers Identified  Expected Discharge Plan and Services         Expected Discharge Date: 06/04/22                                     Social Determinants of Health ([place]) Interventions [place]: No Food Insecurity (06/03/2022)  Housing: Low Risk  (06/03/2022)  Transportation Needs: No Transportation Needs (06/03/2022)  Utilities: Not At Risk (06/03/2022)  Alcohol Screen: Low Risk  (10/24/2021)  Depression (PHQ2-9): Medium Risk (10/24/2021)  Tobacco Use: Medium Risk (06/03/2022)    Readmission Risk Interventions     No data to display

## 2022-06-04 NOTE — Evaluation (Signed)
Physical Therapy Evaluation Patient Details Name: [name] MRN: [id number] DOB: [birth year] Today's Date: 06/04/2022  History of Present Illness  56 y/o s/p partial knee replacement 12/19  Clinical Impression  Pt did well with PT exam and showed great effort despite having some pain hesitancy.  She did well with mobility and was able to rise to standing w/o assist.  Initially slow and guarded with ambulation but with cuing/gait training and increased distance she was able to attain consistent cadence and walker momentum.  Pt did well with stair negotiation, showed ability to ascend/descend steps w/o assist.  Pt with good understanding of issued HEP, showed ability to safely return home.      Recommendations for follow up therapy are one component of a multi-disciplinary discharge planning process, led by the attending physician.  Recommendations may be updated based on patient status, additional functional criteria and insurance authorization.  Follow Up Recommendations Follow physician's recommendations for discharge plan and follow up therapies      Assistance Recommended at Discharge Set up Supervision/Assistance  Patient can return home with the following  A little help with walking and/or transfers;A little help with bathing/dressing/bathroom;Assistance with cooking/housework;Help with stairs or ramp for entrance;Assist for transportation    Equipment Recommendations Rolling walker (2 wheels);BSC/3in1  Recommendations for Other Services       Functional Status Assessment Patient has had a recent decline in their functional status and demonstrates the ability to make significant improvements in function in a reasonable and predictable amount of time.     Precautions / Restrictions Precautions Precautions: Fall Restrictions Weight Bearing Restrictions: Yes LLE Weight Bearing: Weight bearing as tolerated      Mobility  Bed Mobility Overal bed mobility: Independent              General bed mobility comments: Pt able to ease to EOB w/o phyiscal assist    Transfers Overall transfer level: Modified independent Equipment used: Rolling walker (2 wheels)               General transfer comment: cuing for set up and sequencing, able to rise from standard height bed w/o direct assist, heavy UE use    Ambulation/Gait Ambulation/Gait assistance: Min guard Gait Distance (Feet): 150 Feet Assistive device: Rolling walker (2 wheels)         General Gait Details: Pt initially with slow and guarded gait the improved to consistent cadence with constant walker momentum with increased gait training cues and mechanics explanation  Stairs Stairs: Yes Stairs assistance: Supervision Stair Management: One rail Left Number of Stairs: 4 General stair comments: Pt was able to  Wheelchair Mobility    Modified Rankin (Stroke Patients Only)       Balance Overall balance assessment: Modified Independent (reliant on walker)                                           Pertinent Vitals/Pain Pain Assessment Pain Assessment: 0-10 Pain Score: 3  Pain Location: L knee, increases with exercises/ROM Pain Intervention(s): Premedicated before session    Home Living Family/patient expects to be discharged to:: Private residence Living Arrangements: Spouse/significant other Available Help at Discharge: Family   Home Access: Stairs to enter Entrance Stairs-Rails: Left (no rail at single step) Technical brewer of Steps: 1+3   Home Layout: One level Home Equipment: Cane - single point  Prior Function Prior Level of Function : Independent/Modified Independent             Mobility Comments: has been needing SPC for 1-2 months due to knee pain       Hand Dominance        Extremity/Trunk Assessment   Upper Extremity Assessment Upper Extremity Assessment: Overall WFL for tasks assessed    Lower Extremity  Assessment Lower Extremity Assessment: Overall WFL for tasks assessed (expected post-op weakness, SLRs only after significant warm up and effort)       Communication   Communication: No difficulties  Cognition Arousal/Alertness: Awake/alert Behavior During Therapy: WFL for tasks assessed/performed Overall Cognitive Status: Within Functional Limits for tasks assessed                                          General Comments General comments (skin integrity, edema, etc.): Pt eager to work with PT and despite some pain showed great effort and ultimately reached typical POD1 expectations    Exercises Total Joint Exercises Ankle Circles/Pumps: AROM, 10 reps Quad Sets: Strengthening, 10 reps Short Arc Quad: AROM, 10 reps Heel Slides: Strengthening, 10 reps (resisted leg extensions) Hip ABduction/ADduction: Strengthening, 10 reps Straight Leg Raises: AAROM, AROM, 10 reps (AAROM for first 8 reps, she was able to do a few labored reps end of session) Knee Flexion: PROM, 5 reps Goniometric ROM: 0-81   Assessment/Plan    PT Assessment Patient needs continued PT services  PT Problem List Decreased strength       PT Treatment Interventions DME instruction;Stair training;Gait training;Functional mobility training;Therapeutic activities;Therapeutic exercise;Balance training;Neuromuscular re-education;Patient/family education    PT Goals (Current goals can be found in the Care Plan section)  Acute Rehab PT Goals Patient Stated Goal: Go home PT Goal Formulation: With patient Time For Goal Achievement: 06/17/22 Potential to Achieve Goals: Good    Frequency BID     Co-evaluation               AM-PAC PT "6 Clicks" Mobility  Outcome Measure Help needed turning from your back to your side while in a flat bed without using bedrails?: None Help needed moving from lying on your back to sitting on the side of a flat bed without using bedrails?: None Help needed  moving to and from a bed to a chair (including a wheelchair)?: A Little Help needed standing up from a chair using your arms (e.g., wheelchair or bedside chair)?: A Little Help needed to walk in hospital room?: A Little Help needed climbing 3-5 steps with a railing? : A Little 6 Click Score: 20    End of Session Equipment Utilized During Treatment: Gait belt Activity Tolerance: Patient tolerated treatment well Patient left: with call bell/phone within reach;in chair Nurse Communication: Mobility status PT Visit Diagnosis: Muscle weakness (generalized) (M62.81);Difficulty in walking, not elsewhere classified (R26.2);Pain Pain - Right/Left: Left Pain - part of body: Knee    Time: 0818-0910 PT Time Calculation (min) (ACUTE ONLY): 52 min   Charges:   PT Evaluation $PT Eval Low Complexity: 1 Low PT Treatments $Gait Training: 8-22 mins $Therapeutic Exercise: 8-22 mins        Kreg Shropshire, DPT 06/04/2022, 10:47 AM

## 2022-06-04 NOTE — Anesthesia Postprocedure Evaluation (Signed)
Anesthesia Post Note  Patient: Kourtney Terriquez  Procedure(s) Performed: Partial KNEE ARTHROPLASTY (Left: Knee)  Patient location during evaluation: Nursing Unit Anesthesia Type: Spinal Level of consciousness: awake, awake and alert and oriented Pain management: pain level controlled Vital Signs Assessment: post-procedure vital signs reviewed and stable Respiratory status: spontaneous breathing, nonlabored ventilation and respiratory function stable Cardiovascular status: blood pressure returned to baseline and stable Postop Assessment: no headache and no backache Anesthetic complications: no  No notable events documented.   Last Vitals:  Vitals:   06/04/22 0600 06/04/22 0713  BP: 108/68 120/65  Pulse: 78 80  Resp: 16 16  Temp: 36.7 C 37.2 C  SpO2: 97% 94%    Last Pain:  Vitals:   06/04/22 0713  TempSrc: Temporal  PainSc: 0-No pain                 Johnna Acosta

## 2022-06-04 NOTE — Progress Notes (Signed)
Patient is not able to walk the distance required to go the bathroom, or he/she is unable to safely negotiate stairs required to access the bathroom.  A 3in1 BSC will alleviate this problem  

## 2022-06-07 ENCOUNTER — Other Ambulatory Visit: Payer: Self-pay | Admitting: Student

## 2022-06-07 ENCOUNTER — Ambulatory Visit
Admission: RE | Admit: 2022-06-07 | Discharge: 2022-06-07 | Disposition: A | Discharge status: Home / Self Care | Payer: Commercial Managed Care - HMO | Source: Ambulatory Visit | Attending: Student | Admitting: Student

## 2022-06-07 DIAGNOSIS — M7989 Other specified soft tissue disorders: Secondary | ICD-10-CM

## 2022-06-10 ENCOUNTER — Other Ambulatory Visit: Payer: Self-pay | Admitting: Student

## 2022-06-10 DIAGNOSIS — R2242 Localized swelling, mass and lump, left lower limb: Secondary | ICD-10-CM

## 2022-06-26 ENCOUNTER — Other Ambulatory Visit: Payer: Self-pay | Admitting: Internal Medicine

## 2022-06-26 NOTE — Telephone Encounter (Signed)
Unable to refill per protocol, Rx request is too soon. Last refill 01/17/22 for 90 days and 1 refill.  Requested Prescriptions  Pending Prescriptions Disp Refills   hydrOXYzine (VISTARIL) 25 MG capsule [Pharmacy Med Name: HYDROXYZINE PAM 25 MG CAP] 90 capsule 2    Sig: TAKE 1 CAPSULE (25 MG TOTAL) BY MOUTH DAILY AS NEEDED.     Ear, Nose, and Throat:  Antihistamines 2 Passed - 06/26/2022 10:32 AM      Passed - Cr in normal range and within 360 days    Creat  Date Value Ref Range Status  04/17/2022 0.79 0.50 - 1.03 mg/dL Final         Passed - Valid encounter within last 12 months    Recent Outpatient Visits           5 months ago Community acquired pneumonia of left lower lobe of lung   [place], NP   8 months ago [facility], Coralie Keens, NP   11 months ago [facility], Coralie Keens, NP   1 year ago Encounter for completion of form with patient   [facility], Coralie Keens, NP   1 year ago Encounter for general adult medical examination with abnormal findings   [facility], Coralie Keens, NP       Future Appointments             In 1 month [facility] [place], Bl[facility] [place]. [place]

## 2022-06-27 ENCOUNTER — Encounter: Payer: Self-pay | Admitting: Internal Medicine

## 2022-07-01 ENCOUNTER — Telehealth: Payer: Commercial Managed Care - HMO | Admitting: Physician Assistant

## 2022-07-01 DIAGNOSIS — B9689 Other specified bacterial agents as the cause of diseases classified elsewhere: Secondary | ICD-10-CM

## 2022-07-01 DIAGNOSIS — J019 Acute sinusitis, unspecified: Secondary | ICD-10-CM

## 2022-07-01 MED ORDER — DOXYCYCLINE HYCLATE 100 MG PO TABS: 100.0000 mg | ORAL_TABLET | Freq: Two times a day (BID) | ORAL | 0 refills | Status: DC

## 2022-07-01 MED ORDER — BENZONATATE 100 MG PO CAPS: 100.0000 mg | ORAL_CAPSULE | Freq: Three times a day (TID) | ORAL | 0 refills | Status: DC | PRN

## 2022-07-01 MED ORDER — PREDNISONE 20 MG PO TABS: 40.0000 mg | ORAL_TABLET | Freq: Every day | ORAL | 0 refills | Status: DC

## 2022-07-01 NOTE — Telephone Encounter (Signed)
Please advise patient to be seen at urgent care for further evaluation for the possible sinus infection.   Patient should not resume methotrexate until her symptoms have completely resolved and she has completed the course of antibiotics.

## 2022-07-01 NOTE — Progress Notes (Signed)
Virtual Visit Consent   [name], you are scheduled for a virtual visit with a [facility] provider today. Just as with appointments in the office, your consent must be obtained to participate. Your consent will be active for this visit and any virtual visit you may have with one of our providers in the next 365 days. If you have a MyChart account, a copy of this consent can be sent to you electronically.  As this is a virtual visit, video technology does not allow for your provider to perform a traditional examination. This may limit your provider's ability to fully assess your condition. If your provider identifies any concerns that need to be evaluated in person or the need to arrange testing (such as labs, EKG, etc.), we will make arrangements to do so. Although advances in technology are sophisticated, we cannot ensure that it will always work on either your end or our end. If the connection with a video visit is poor, the visit may have to be switched to a telephone visit. With either a video or telephone visit, we are not always able to ensure that we have a secure connection.  By engaging in this virtual visit, you consent to the provision of healthcare and authorize for your insurance to be billed (if applicable) for the services provided during this visit. Depending on your insurance coverage, you may receive a charge related to this service.  I need to obtain your verbal consent now. Are you willing to proceed with your visit today? [name] has provided verbal consent on 07/01/2022 for a virtual visit (video or telephone). [name], [place]  Date: 07/01/2022 10:00 AM  Virtual Visit via Video Note   I, [name], connected with  [name]  ([id number], 57/30/1967) on 07/01/22 at  9:30 AM EST by a video-enabled telemedicine application and verified that I am speaking with the correct person using two identifiers.  Location: Patient:  Virtual Visit Location Patient: Home Provider: Virtual Visit Location Provider: Home Office   I discussed the limitations of evaluation and management by telemedicine and the availability of in person appointments. The patient expressed understanding and agreed to proceed.    History of Present Illness: [name] is a 57 y.o. who identifies as a female who was assigned female at birth, and is being seen today for nasal congestion, sinus pressure and maxillary sinus pain. Denies fever, chills. Some cough with this. Recently had partial knee replacement and recent diagnosis of Inflammatory arthritis and Sjogrens syndrome. Off of her methotrexate due to recent surgery but supposed to restart and not sure what to do giving current illness. Denies recent travel or sick contact. Notes being off of her methotrexate has caused significant flare in her inflammatory arthritis pains.   HPI: HPI  Problems:  Patient Active Problem List   Diagnosis Date Noted   History of unicondylar arthroplasty of left knee 06/03/2022   Primary osteoarthritis of both hands 10/31/2021   Fibromyalgia 10/24/2021   Sjogren's syndrome ([place]) 10/24/2021   DDD (degenerative disc disease), lumbar 10/14/2021   Insomnia 02/14/2021   Prediabetes 12/31/2020   HLD (hyperlipidemia) 12/31/2020   Frequent headaches 12/31/2020   Morbid obesity ([place]) 12/31/2020   Gastroesophageal reflux disease without esophagitis 11/15/2018   Anxiety and depression 08/03/2014    Allergies:  Allergies  Allergen Reactions   Gabapentin Other (See Comments)    Hair loss   Hydrochlorothiazide Other (See Comments)    The pt  state she couldn't function and felt like she was dying    Lipitor [Atorvastatin]     Muscle pain   Penicillins Other (See Comments)    Childhood allergy Has patient had a PCN reaction causing immediate rash, facial/tongue/throat swelling, SOB or lightheadedness with hypotension: Unknown Has patient had a PCN  reaction causing severe rash involving mucus membranes or skin necrosis: Unknown Has patient had a PCN reaction that required hospitalization: No  Has patient had a PCN reaction occurring within the last 10 years: No  If all of the above answers are "NO", then may proceed with Cephalosporin use.    Orange Juice [Orange Oil] Rash    rash   Medications:  Current Outpatient Medications:    acetaminophen (TYLENOL 8 HOUR ARTHRITIS PAIN) 650 MG CR tablet, Take 1,300 mg by mouth daily., Disp: , Rfl:    ALLEGRA-D ALLERGY & CONGESTION 180-240 MG 24 hr tablet, TAKE 1 TABLET BY MOUTH EVERY DAY AS NEEDED (Patient taking differently: 1 tablet as needed.), Disp: 30 tablet, Rfl: 1   benzonatate (TESSALON) 100 MG capsule, Take 1 capsule (100 mg total) by mouth 3 (three) times daily as needed for cough., Disp: 30 capsule, Rfl: 0   diclofenac Sodium (VOLTAREN) 1 % GEL, APPLY 2 GRAMS TO AFFECTED AREA 4 TIMES A DAY (Patient taking differently: 2 g as needed.), Disp: 100 g, Rfl: 0   docusate sodium (COLACE) 100 MG capsule, Take 1 capsule (100 mg total) by mouth 2 (two) times daily., Disp: 30 capsule, Rfl: 0   doxycycline (VIBRA-TABS) 100 MG tablet, Take 1 tablet (100 mg total) by mouth 2 (two) times daily., Disp: 20 tablet, Rfl: 0   famotidine (PEPCID) 20 MG tablet, Take 1 tablet (20 mg total) by mouth 2 (two) times daily as needed for heartburn or indigestion., Disp: 180 tablet, Rfl: 1   HYDROcodone-acetaminophen (NORCO/VICODIN) 5-325 MG tablet, Take 1 tablet by mouth every 4 (four) hours as needed for moderate pain (pain score 4-6)., Disp: 30 tablet, Rfl: 0   hydrOXYzine (VISTARIL) 25 MG capsule, Take 1 capsule (25 mg total) by mouth daily as needed. (Patient taking differently: Take 25 mg by mouth daily as needed (sleep).), Disp: 90 capsule, Rfl: 1   methocarbamol (ROBAXIN) 500 MG tablet, Take 500 mg by mouth at bedtime., Disp: , Rfl:    methocarbamol (ROBAXIN) 500 MG tablet, Take 1 tablet (500 mg total) by mouth  every 8 (eight) hours as needed for muscle spasms., Disp: 60 tablet, Rfl: 1   methotrexate (RHEUMATREX) 2.5 MG tablet, Take 6 tabs po weekly x 2 weeks. If labs are stable increase to 8 tabs po weekly.  Caution:Chemotherapy. Protect from light., Disp: 28 tablet, Rfl: 0   pantoprazole (PROTONIX) 40 MG tablet, Take 1 tablet (40 mg total) by mouth 2 (two) times daily. (Patient taking differently: Take 40 mg by mouth at bedtime.), Disp: 180 tablet, Rfl: 1   predniSONE (DELTASONE) 20 MG tablet, Take 2 tablets (40 mg total) by mouth daily with breakfast., Disp: 10 tablet, Rfl: 0   VITAMIN D PO, Take 1 tablet by mouth once a week., Disp: , Rfl:   Observations/Objective: Patient is well-developed, well-nourished in no acute distress.  Resting comfortably  at home.  Head is normocephalic, atraumatic.  No labored breathing. Speech is clear and coherent with logical content.  Patient is alert and oriented at baseline.   Assessment and Plan: 1. Acute bacterial sinusitis - predniSONE (DELTASONE) 20 MG tablet; Take 2 tablets (40 mg total) by  mouth daily with breakfast.  Dispense: 10 tablet; Refill: 0 - doxycycline (VIBRA-TABS) 100 MG tablet; Take 1 tablet (100 mg total) by mouth 2 (two) times daily.  Dispense: 20 tablet; Refill: 0 - benzonatate (TESSALON) 100 MG capsule; Take 1 capsule (100 mg total) by mouth 3 (three) times daily as needed for cough.  Dispense: 30 capsule; Refill: 0  Rx Doxycycline.  Increase fluids.  Rest.  Saline nasal spray.  Probiotic.  Mucinex as directed.  Humidifier in bedroom. Prednisone 5 day burst for sinus inflammation and also to calm flare of her inflammatory arthritis since she had to be off of the methotrexate during and after surgery.  Can resume methotrexate once treatments are complete and symptoms resolved.  Call or return to clinic if symptoms are not improving.   Follow Up Instructions: I discussed the assessment and treatment plan with the patient. The patient was  provided an opportunity to ask questions and all were answered. The patient agreed with the plan and demonstrated an understanding of the instructions.  A copy of instructions were sent to the patient via MyChart unless otherwise noted below.   The patient was advised to call back or seek an in-person evaluation if the symptoms worsen or if the condition fails to improve as anticipated.  Time:  I spent 10 minutes with the patient via telehealth technology discussing the above problems/concerns.    Alice Rio, PA-C

## 2022-07-01 NOTE — Telephone Encounter (Signed)
It is unclear what would be causing the burning pain especially at the incision site.   Patient was prescribed a prescription for prednisone today and has a prescription for hydrocodone for pain relief.  I would recommend following up with her surgeon if the burning at the incision site continues

## 2022-07-01 NOTE — Patient Instructions (Signed)
Donnella Bi, thank you for joining [name], PA-C for today's virtual visit.  While this provider is not your primary care provider (PCP), if your PCP is located in our provider database this encounter information will be shared with them immediately following your visit.   [facility] account gives you access to today's visit and all your visits, tests, and labs performed at [facility] " click here if you don't have a [facility] account or go to [email or web address]  Consent: (Patient) [name] provided verbal consent for this virtual visit at the beginning of the encounter.  Current Medications:  Current Outpatient Medications:    acetaminophen (TYLENOL 8 HOUR ARTHRITIS PAIN) 650 MG CR tablet, Take 1,300 mg by mouth daily., Disp: , Rfl:    ALLEGRA-D ALLERGY & CONGESTION 180-240 MG 24 hr tablet, TAKE 1 TABLET BY MOUTH EVERY DAY AS NEEDED (Patient taking differently: 1 tablet as needed.), Disp: 30 tablet, Rfl: 1   diclofenac Sodium (VOLTAREN) 1 % GEL, APPLY 2 GRAMS TO AFFECTED AREA 4 TIMES A DAY (Patient taking differently: 2 g as needed.), Disp: 100 g, Rfl: 0   docusate sodium (COLACE) 100 MG capsule, Take 1 capsule (100 mg total) by mouth 2 (two) times daily., Disp: 30 capsule, Rfl: 0   famotidine (PEPCID) 20 MG tablet, Take 1 tablet (20 mg total) by mouth 2 (two) times daily as needed for heartburn or indigestion., Disp: 180 tablet, Rfl: 1   HYDROcodone-acetaminophen (NORCO/VICODIN) 5-325 MG tablet, Take 1 tablet by mouth every 4 (four) hours as needed for moderate pain (pain score 4-6)., Disp: 30 tablet, Rfl: 0   hydrOXYzine (VISTARIL) 25 MG capsule, Take 1 capsule (25 mg total) by mouth daily as needed. (Patient taking differently: Take 25 mg by mouth daily as needed (sleep).), Disp: 90 capsule, Rfl: 1   methocarbamol (ROBAXIN) 500 MG tablet, Take 500 mg by mouth at bedtime., Disp: , Rfl:    methocarbamol (ROBAXIN) 500 MG  tablet, Take 1 tablet (500 mg total) by mouth every 8 (eight) hours as needed for muscle spasms., Disp: 60 tablet, Rfl: 1   methotrexate (RHEUMATREX) 2.5 MG tablet, Take 6 tabs po weekly x 2 weeks. If labs are stable increase to 8 tabs po weekly.  Caution:Chemotherapy. Protect from light., Disp: 28 tablet, Rfl: 0   pantoprazole (PROTONIX) 40 MG tablet, Take 1 tablet (40 mg total) by mouth 2 (two) times daily. (Patient taking differently: Take 40 mg by mouth at bedtime.), Disp: 180 tablet, Rfl: 1   rivaroxaban (XARELTO) 10 MG TABS tablet, Take 1 tablet (10 mg total) by mouth daily., Disp: 10 tablet, Rfl: 0   VITAMIN D PO, Take 1 tablet by mouth once a week., Disp: , Rfl:    Medications ordered in this encounter:  No orders of the defined types were placed in this encounter.    *If you need refills on other medications prior to your next appointment, please contact your pharmacy*  Follow-Up: Call back or seek an in-person evaluation if the symptoms worsen or if the condition fails to improve as anticipated.  [place] [phone]  Other Instructions Please take antibiotic as directed.  Increase fluid intake.  Use Saline nasal spray.  Take a daily multivitamin. Use the Prednisone as directed.  Place a humidifier in the bedroom. You can restart the Methotrexate once treatments complete as directed by your Rheumatologist. Please call or return clinic if symptoms are not improving.  Sinusitis Sinusitis  is redness, soreness, and swelling (inflammation) of the paranasal sinuses. Paranasal sinuses are air pockets within the bones of your face (beneath the eyes, the middle of the forehead, or above the eyes). In healthy paranasal sinuses, mucus is able to drain out, and air is able to circulate through them by way of your nose. However, when your paranasal sinuses are inflamed, mucus and air can become trapped. This can allow bacteria and other germs to grow and cause  infection. Sinusitis can develop quickly and last only a short time (acute) or continue over a long period (chronic). Sinusitis that lasts for more than 12 weeks is considered chronic.  CAUSES  Causes of sinusitis include: Allergies. Structural abnormalities, such as displacement of the cartilage that separates your nostrils (deviated septum), which can decrease the air flow through your nose and sinuses and affect sinus drainage. Functional abnormalities, such as when the small hairs (cilia) that line your sinuses and help remove mucus do not work properly or are not present. SYMPTOMS  Symptoms of acute and chronic sinusitis are the same. The primary symptoms are pain and pressure around the affected sinuses. Other symptoms include: Upper toothache. Earache. Headache. Bad breath. Decreased sense of smell and taste. A cough, which worsens when you are lying flat. Fatigue. Fever. Thick drainage from your nose, which often is green and may contain pus (purulent). Swelling and warmth over the affected sinuses. DIAGNOSIS  Your caregiver will perform a physical exam. During the exam, your caregiver may: Look in your nose for signs of abnormal growths in your nostrils (nasal polyps). Tap over the affected sinus to check for signs of infection. View the inside of your sinuses (endoscopy) with a special imaging device with a light attached (endoscope), which is inserted into your sinuses. If your caregiver suspects that you have chronic sinusitis, one or more of the following tests may be recommended: Allergy tests. Nasal culture A sample of mucus is taken from your nose and sent to a lab and screened for bacteria. Nasal cytology A sample of mucus is taken from your nose and examined by your caregiver to determine if your sinusitis is related to an allergy. TREATMENT  Most cases of acute sinusitis are related to a viral infection and will resolve on their own within 10 days. Sometimes medicines  are prescribed to help relieve symptoms (pain medicine, decongestants, nasal steroid sprays, or saline sprays).  However, for sinusitis related to a bacterial infection, your caregiver will prescribe antibiotic medicines. These are medicines that will help kill the bacteria causing the infection.  Rarely, sinusitis is caused by a fungal infection. In theses cases, your caregiver will prescribe antifungal medicine. For some cases of chronic sinusitis, surgery is needed. Generally, these are cases in which sinusitis recurs more than 3 times per year, despite other treatments. HOME CARE INSTRUCTIONS  Drink plenty of water. Water helps thin the mucus so your sinuses can drain more easily. Use a humidifier. Inhale steam 3 to 4 times a day (for example, sit in the bathroom with the shower running). Apply a warm, moist washcloth to your face 3 to 4 times a day, or as directed by your caregiver. Use saline nasal sprays to help moisten and clean your sinuses. Take over-the-counter or prescription medicines for pain, discomfort, or fever only as directed by your caregiver. SEEK IMMEDIATE MEDICAL CARE IF: You have increasing pain or severe headaches. You have nausea, vomiting, or drowsiness. You have swelling around your face. You have vision problems. You  have a stiff neck. You have difficulty breathing. MAKE SURE YOU:  Understand these instructions. Will watch your condition. Will get help right away if you are not doing well or get worse. Document Released: 06/02/2005 Document Revised: 08/25/2011 Document Reviewed: 06/17/2011 [facility] Patient Information 2014 [place], [place].    If you have been instructed to have an in-person evaluation today at a local Urgent Care facility, please use the link below. It will take you to a list of all of our available [facility], including address, phone number and hours of operation. Please do not delay care.  [facility]  If you or  a family member do not have a primary care provider, use the link below to schedule a visit and establish care. When you choose a [facility] primary care physician or advanced practice provider, you gain a long-term partner in health. Find a Primary Care Provider  Learn more about [facility]'s in-office and virtual care options: [facility]

## 2022-07-09 ENCOUNTER — Encounter: Payer: Self-pay | Admitting: *Deleted

## 2022-07-09 DIAGNOSIS — Z13820 Encounter for screening for osteoporosis: Secondary | ICD-10-CM

## 2022-07-09 NOTE — Telephone Encounter (Signed)
Ok to restart methotrexate 6 tablets by mouth once weekly x2 weeks and if labs are stable increase to 8 tablets by mouth once weekly.   Lab work due in 2 weeks x2, 2 months, then every 3 months.   She should also take folic acid 2 mg daily.

## 2022-07-09 NOTE — Telephone Encounter (Addendum)
Please advise 

## 2022-07-10 NOTE — Telephone Encounter (Signed)
Recommend ordering DEXA to screen for osteoporosis.    Ok to restart MTX as long as it is not an open fracture.

## 2022-07-14 NOTE — Telephone Encounter (Signed)
Please call the patient to discuss.

## 2022-07-14 NOTE — Telephone Encounter (Signed)
I called patient to discuss. She has completed prednisone taper that was prescribed about two weeks ago. I advised that prednisone can impair wound healing and would ideally minimized steroid exposure during this post-operative period. She was prescribing Norco for post-op pain for as-needed use. Advised her to use this if needed.  Reviewed that she should hold MTX with signs/symptoms of infection. Advised that she can restart folic acid the same day she restarts MTX. Reviewed that MTX is taken once weekly and folic acid is taken daily.   Reviewed DDI interaction between MTX and pantoprazole. Reviewed that we will monitor labs, but interaction can cause MTX side effects Like increased risk for GI side effects. Usually occurs at higher doses of MTX.  Knox Saliva, PharmD, MPH, BCPS, CPP Clinical Pharmacist (Rheumatology and Pulmonology)

## 2022-07-21 MED ORDER — ONDANSETRON HCL 4 MG PO TABS: 4.0000 mg | ORAL_TABLET | Freq: Three times a day (TID) | ORAL | 0 refills | Status: DC | PRN

## 2022-07-21 NOTE — Telephone Encounter (Signed)
Zofran was sent in by her orthopedist at the end of December 2023.  Ok to send a refill of zofran 4 mg 1 tablet every 8 hours as needed for nausea

## 2022-07-21 NOTE — Addendum Note (Signed)
Addended by: Carole Binning on: 07/21/2022 10:06 AM   Modules accepted: Orders

## 2022-07-21 NOTE — Telephone Encounter (Signed)
Okay to take methotrexate once the wound has healed and there are no signs of infection.

## 2022-07-23 ENCOUNTER — Telehealth (INDEPENDENT_AMBULATORY_CARE_PROVIDER_SITE_OTHER): Payer: Commercial Managed Care - HMO | Admitting: Internal Medicine

## 2022-07-23 ENCOUNTER — Encounter: Payer: Self-pay | Admitting: Internal Medicine

## 2022-07-23 DIAGNOSIS — M19042 Primary osteoarthritis, left hand: Secondary | ICD-10-CM

## 2022-07-23 DIAGNOSIS — M797 Fibromyalgia: Secondary | ICD-10-CM

## 2022-07-23 DIAGNOSIS — F5102 Adjustment insomnia: Secondary | ICD-10-CM | POA: Diagnosis not present

## 2022-07-23 DIAGNOSIS — M5136 Other intervertebral disc degeneration, lumbar region: Secondary | ICD-10-CM | POA: Diagnosis not present

## 2022-07-23 DIAGNOSIS — Z0289 Encounter for other administrative examinations: Secondary | ICD-10-CM

## 2022-07-23 DIAGNOSIS — M19041 Primary osteoarthritis, right hand: Secondary | ICD-10-CM

## 2022-07-23 MED ORDER — ESZOPICLONE 2 MG PO TABS: 2.0000 mg | ORAL_TABLET | Freq: Every evening | ORAL | 0 refills | Status: DC | PRN

## 2022-07-23 NOTE — Progress Notes (Signed)
Virtual Visit via Video Note  I connected with [name] on 07/23/22 at 11:20 AM EST by a video enabled telemedicine application and verified that I am speaking with the correct person using two identifiers.  Location: Patient: Home Provider: Office   Persons participating in this video call: Webb Silversmith, NP and Lorna Dibble  I discussed the limitations of evaluation and management by telemedicine and the availability of in person appointments. The patient expressed understanding and agreed to proceed.  History of Present Illness:  Patient requesting handicap placard.  She reports hers is up for renewal.  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  She has also having issues with insomnia.  She is having difficulty falling and staying asleep.  She took trazodone in the past but it was not effective.  She has been prescribed Ambien but was scared to take this.  There is no sleep study on file.  Past Medical History:  Diagnosis Date   Allergy    Anemia    h/o   Anxiety    DDD (degenerative disc disease), lumbar    Depression    Fibromyalgia    GERD (gastroesophageal reflux disease)    Morbid obesity ([place])    Pneumonia 01/2022   PONV (postoperative nausea and vomiting)    Pre-diabetes    Sjogren's syndrome (HCC)     Current Outpatient Medications  Medication Sig Dispense Refill   acetaminophen (TYLENOL 8 HOUR ARTHRITIS PAIN) 650 MG CR tablet Take 1,300 mg by mouth daily.     ALLEGRA-D ALLERGY & CONGESTION 180-240 MG 24 hr tablet TAKE 1 TABLET BY MOUTH EVERY DAY AS NEEDED (Patient taking differently: 1 tablet as needed.) 30 tablet 1   benzonatate (TESSALON) 100 MG capsule Take 1 capsule (100 mg total) by mouth 3 (three) times daily as needed for cough. 30 capsule 0   diclofenac Sodium (VOLTAREN) 1 % GEL APPLY 2 GRAMS TO AFFECTED AREA 4 TIMES A DAY (Patient  taking differently: 2 g as needed.) 100 g 0   docusate sodium (COLACE) 100 MG capsule Take 1 capsule (100 mg total) by mouth 2 (two) times daily. 30 capsule 0   doxycycline (VIBRA-TABS) 100 MG tablet Take 1 tablet (100 mg total) by mouth 2 (two) times daily. 20 tablet 0   famotidine (PEPCID) 20 MG tablet Take 1 tablet (20 mg total) by mouth 2 (two) times daily as needed for heartburn or indigestion. 180 tablet 1   HYDROcodone-acetaminophen (NORCO/VICODIN) 5-325 MG tablet Take 1 tablet by mouth every 4 (four) hours as needed for moderate pain (pain score 4-6). 30 tablet 0   hydrOXYzine (VISTARIL) 25 MG capsule Take 1 capsule (25 mg total) by mouth daily as needed. (Patient taking differently: Take 25 mg by mouth daily as needed (sleep).) 90 capsule 1   methocarbamol (ROBAXIN) 500 MG tablet Take 500 mg by mouth at bedtime.     methocarbamol (ROBAXIN) 500 MG tablet Take 1 tablet (500 mg total) by mouth every 8 (eight) hours as needed for muscle spasms. 60 tablet 1   methotrexate (RHEUMATREX) 2.5 MG tablet Take 6 tabs po weekly x 2 weeks. If labs are stable increase to 8 tabs po weekly.  Caution:Chemotherapy. Protect from light. 28 tablet 0   ondansetron (ZOFRAN) 4 MG tablet Take 1 tablet (4 mg total) by mouth every 8 (eight) hours as needed for nausea or vomiting. 30 tablet  0   pantoprazole (PROTONIX) 40 MG tablet Take 1 tablet (40 mg total) by mouth 2 (two) times daily. (Patient taking differently: Take 40 mg by mouth at bedtime.) 180 tablet 1   predniSONE (DELTASONE) 20 MG tablet Take 2 tablets (40 mg total) by mouth daily with breakfast. 10 tablet 0   VITAMIN D PO Take 1 tablet by mouth once a week.     No current facility-administered medications for this visit.    Allergies  Allergen Reactions   Gabapentin Other (See Comments)    Hair loss   Hydrochlorothiazide Other (See Comments)    The pt state she couldn't function and felt like she was dying    Lipitor [Atorvastatin]     Muscle pain    Penicillins Other (See Comments)    Childhood allergy Has patient had a PCN reaction causing immediate rash, facial/tongue/throat swelling, SOB or lightheadedness with hypotension: Unknown Has patient had a PCN reaction causing severe rash involving mucus membranes or skin necrosis: Unknown Has patient had a PCN reaction that required hospitalization: No  Has patient had a PCN reaction occurring within the last 10 years: No  If all of the above answers are "NO", then may proceed with Cephalosporin use.    Orange Juice [Orange Oil] Rash    rash    Family History  Problem Relation Age of Onset   Hyperlipidemia Mother    Rheum arthritis Sister    Osteoarthritis Sister    Fibroids Sister    Fibroids Sister    Fibroids Sister    Heart disease Maternal Aunt    Stroke Maternal Aunt    Diabetes Maternal Aunt    Heart disease Maternal Uncle    Stroke Maternal Uncle    Rheum arthritis Nephew    Cancer Neg Hx     Social History   Socioeconomic History   Marital status: Married    Spouse name: Not on file   Number of children: Not on file   Years of education: Not on file   Highest education level: Not on file  Occupational History   Not on file  Tobacco Use   Smoking status: Never    Passive exposure: Current   Smokeless tobacco: Never  Vaping Use   Vaping Use: Never used  Substance and Sexual Activity   Alcohol use: Yes    Comment: occ   Drug use: Not Currently    Types: Marijuana    Comment: has tried   Sexual activity: Yes    Partners: Male    Birth control/protection: Post-menopausal, Surgical  Other Topics Concern   Not on file  Social History Narrative   Not on file   Social Determinants of Health   Financial Resource Strain: Not on file  Food Insecurity: No Food Insecurity (06/03/2022)   Hunger Vital Sign    Worried About Running Out of Food in the Last Year: Never true    Ran Out of Food in the Last Year: Never true  Transportation Needs: No  Transportation Needs (06/03/2022)   PRAPARE - Hydrologist (Medical): No    Lack of Transportation (Non-Medical): No  Physical Activity: Not on file  Stress: Not on file  Social Connections: Not on file  Intimate Partner Violence: Not At Risk (06/03/2022)   Humiliation, Afraid, Rape, and Kick questionnaire    Fear of Current or Ex-Partner: No    Emotionally Abused: No    Physically Abused: No    Sexually  Abused: No     Constitutional: Denies fever, malaise, fatigue, headache or abrupt weight changes.  Respiratory: Denies difficulty breathing, shortness of breath, cough or sputum production.   Cardiovascular: Denies chest pain, chest tightness, palpitations or swelling in the hands or feet.  Musculoskeletal: Patient reports chronic joint pain in hands, back, knees, difficulty with gait.  Denies decrease in range of motion, or joint swelling.  Skin: Denies redness, rashes, lesions or ulcercations.  Neurological: Patient reports insomnia.  Denies dizziness, difficulty with memory, difficulty with speech or problems with balance and coordination.    No other specific complaints in a complete review of systems (except as listed in HPI above).    Observations/Objective:   Wt Readings from Last 3 Encounters:  06/03/22 273 lb 5.9 oz (124 kg)  05/27/22 273 lb 5.9 oz (124 kg)  05/22/22 272 lb (123.4 kg)    General: Appears her stated age, obese, in NAD. Pulmonary/Chest: Normal effort. No respiratory distress.  Neurological: Alert and oriented.   BMET    Component Value Date/Time   NA 142 04/17/2022 1447   NA 142 09/18/2016 0929   K 3.9 04/17/2022 1447   CL 109 04/17/2022 1447   CO2 25 04/17/2022 1447   GLUCOSE 133 (H) 04/17/2022 1447   BUN 16 04/17/2022 1447   BUN 9 09/18/2016 0929   CREATININE 0.79 04/17/2022 1447   CALCIUM 9.3 04/17/2022 1447   GFRNONAA >60 01/15/2022 2334   GFRAA >60 12/01/2019 1558    Lipid Panel     Component Value  Date/Time   CHOL 236 (H) 03/28/2021 0838   CHOL 200 (H) 03/23/2017 0844   TRIG 88 03/28/2021 0838   HDL 73 03/28/2021 0838   HDL 57 03/23/2017 0844   CHOLHDL 3.2 03/28/2021 0838   VLDL 36.2 03/19/2020 0918   LDLCALC 144 (H) 03/28/2021 0838    CBC    Component Value Date/Time   WBC 5.1 04/17/2022 1447   RBC 4.09 04/17/2022 1447   HGB 12.9 04/17/2022 1447   HGB 13.2 09/18/2016 0929   HCT 38.4 04/17/2022 1447   HCT 38.1 09/18/2016 0929   PLT 241 04/17/2022 1447   PLT 268 09/18/2016 0929   MCV 93.9 04/17/2022 1447   MCV 94 09/18/2016 0929   MCH 31.5 04/17/2022 1447   MCHC 33.6 04/17/2022 1447   RDW 13.4 04/17/2022 1447   RDW 12.8 09/18/2016 0929   LYMPHSABS 1,617 04/17/2022 1447   LYMPHSABS 2.8 08/03/2014 1609   MONOABS 0.6 08/30/2019 0315   EOSABS 133 04/17/2022 1447   EOSABS 0.1 08/03/2014 1609   BASOSABS 51 04/17/2022 1447   BASOSABS 0.0 08/03/2014 1609    Hgb A1C Lab Results  Component Value Date   HGBA1C 5.8 (H) 03/28/2021       Assessment and Plan:  Encounter for completion with Patient:  Handicap placard filled out and placed upfront for pickup  Schedule appointment for annual exam Follow Up Instructions:    I discussed the assessment and treatment plan with the patient. The patient was provided an opportunity to ask questions and all were answered. The patient agreed with the plan and demonstrated an understanding of the instructions.   The patient was advised to call back or seek an in-person evaluation if the symptoms worsen or if the condition fails to improve as anticipated.    Webb Silversmith, NP

## 2022-07-23 NOTE — Assessment & Plan Note (Signed)
Will try Lunesta 2 mg

## 2022-07-23 NOTE — Assessment & Plan Note (Signed)
Encourage regular stretching, exercise for weight loss and core strengthening

## 2022-07-23 NOTE — Assessment & Plan Note (Signed)
Encourage regular stretching and muscle toning

## 2022-07-23 NOTE — Patient Instructions (Signed)
Insomnia Insomnia is a sleep disorder that makes it difficult to fall asleep or stay asleep. Insomnia can cause fatigue, low energy, difficulty concentrating, mood swings, and poor performance at work or school. There are three different ways to classify insomnia: Difficulty falling asleep. Difficulty staying asleep. Waking up too early in the morning. Any type of insomnia can be long-term (chronic) or short-term (acute). Both are common. Short-term insomnia usually lasts for 3 months or less. Chronic insomnia occurs at least three times a week for longer than 3 months. What are the causes? Insomnia may be caused by another condition, situation, or substance, such as: Having certain mental health conditions, such as anxiety and depression. Using caffeine, alcohol, tobacco, or drugs. Having gastrointestinal conditions, such as gastroesophageal reflux disease (GERD). Having certain medical conditions. These include: Asthma. Alzheimer's disease. Stroke. Chronic pain. An overactive thyroid gland (hyperthyroidism). Other sleep disorders, such as restless legs syndrome and sleep apnea. Menopause. Sometimes, the cause of insomnia may not be known. What increases the risk? Risk factors for insomnia include: Gender. Females are affected more often than males. Age. Insomnia is more common as people get older. Stress and certain medical and mental health conditions. Lack of exercise. Having an irregular work schedule. This may include working night shifts and traveling between different time zones. What are the signs or symptoms? If you have insomnia, the main symptom is having trouble falling asleep or having trouble staying asleep. This may lead to other symptoms, such as: Feeling tired or having low energy. Feeling nervous about going to sleep. Not feeling rested in the morning. Having trouble concentrating. Feeling irritable, anxious, or depressed. How is this diagnosed? This condition  may be diagnosed based on: Your symptoms and medical history. Your health care provider may ask about: Your sleep habits. Any medical conditions you have. Your mental health. A physical exam. How is this treated? Treatment for insomnia depends on the cause. Treatment may focus on treating an underlying condition that is causing the insomnia. Treatment may also include: Medicines to help you sleep. Counseling or therapy. Lifestyle adjustments to help you sleep better. Follow these instructions at home: Eating and drinking  Limit or avoid alcohol, caffeinated beverages, and products that contain nicotine and tobacco, especially close to bedtime. These can disrupt your sleep. Do not eat a large meal or eat spicy foods right before bedtime. This can lead to digestive discomfort that can make it hard for you to sleep. Sleep habits  Keep a sleep diary to help you and your health care provider figure out what could be causing your insomnia. Write down: When you sleep. When you wake up during the night. How well you sleep and how rested you feel the next day. Any side effects of medicines you are taking. What you eat and drink. Make your bedroom a dark, comfortable place where it is easy to fall asleep. Put up shades or blackout curtains to block light from outside. Use a white noise machine to block noise. Keep the temperature cool. Limit screen use before bedtime. This includes: Not watching TV. Not using your smartphone, tablet, or computer. Stick to a routine that includes going to bed and waking up at the same times every day and night. This can help you fall asleep faster. Consider making a quiet activity, such as reading, part of your nighttime routine. Try to avoid taking naps during the day so that you sleep better at night. Get out of bed if you are still awake after   15 minutes of trying to sleep. Keep the lights down, but try reading or doing a quiet activity. When you feel  sleepy, go back to bed. General instructions Take over-the-counter and prescription medicines only as told by your health care provider. Exercise regularly as told by your health care provider. However, avoid exercising in the hours right before bedtime. Use relaxation techniques to manage stress. Ask your health care provider to suggest some techniques that may work well for you. These may include: Breathing exercises. Routines to release muscle tension. Visualizing peaceful scenes. Make sure that you drive carefully. Do not drive if you feel very sleepy. Keep all follow-up visits. This is important. Contact a health care provider if: You are tired throughout the day. You have trouble in your daily routine due to sleepiness. You continue to have sleep problems, or your sleep problems get worse. Get help right away if: You have thoughts about hurting yourself or someone else. Get help right away if you feel like you may hurt yourself or others, or have thoughts about taking your own life. Go to your nearest emergency room or: Call 911. Call the National Suicide Prevention Lifeline at 1-800-273-8255 or 988. This is open 24 hours a day. Text the Crisis Text Line at 741741. Summary Insomnia is a sleep disorder that makes it difficult to fall asleep or stay asleep. Insomnia can be long-term (chronic) or short-term (acute). Treatment for insomnia depends on the cause. Treatment may focus on treating an underlying condition that is causing the insomnia. Keep a sleep diary to help you and your health care provider figure out what could be causing your insomnia. This information is not intended to replace advice given to you by your health care provider. Make sure you discuss any questions you have with your health care provider. Document Revised: 05/13/2021 Document Reviewed: 05/13/2021 [employer] Patient Education  2023 [employer]  

## 2022-07-23 NOTE — Assessment & Plan Note (Signed)
Recently started on methotrexate for inflammatory arthritis per rheumatology

## 2022-07-24 ENCOUNTER — Encounter: Payer: Self-pay | Admitting: Internal Medicine

## 2022-07-24 ENCOUNTER — Other Ambulatory Visit: Payer: Self-pay | Admitting: *Deleted

## 2022-07-24 MED ORDER — FOLIC ACID 1 MG PO TABS: 2.0000 mg | ORAL_TABLET | Freq: Every day | ORAL | 3 refills | Status: DC

## 2022-07-24 MED ORDER — METHOTREXATE SODIUM 2.5 MG PO TABS: ORAL_TABLET | ORAL | 0 refills | Status: DC

## 2022-07-24 NOTE — Progress Notes (Unsigned)
Office Visit Note  Patient: [name]             Date of Birth: 10/24/1965           MRN: [id number]             PCP: Jearld Fenton, NP Referring: Jearld Fenton, NP Visit Date: 08/06/2022 Occupation: @GUAROCC$ @  Subjective:  Medication monitoring   History of Present Illness: Karlee Sieczkowski is a 57 y.o. female with history of Sjogren syndrome, osteoarthritis, and fibromyalgia.  Patient underwent a left knee partial replacement on 06/03/2022 performed by Dr. Harlow Mares.  Patient reports that her progress has been slow which she attributes to fracturing her right foot after hitting it on her cane during the postoperative period.  She has been going to physical therapy twice a week and has started to notice gradual improvement in her range of motion.  She continues to use a cane to assist with ambulation.  Her pain level remains severe so yesterday she was started on meloxicam 15 mg 1 tablet daily.  She remains on Norco for pain relief as well.  Patient reports that she tried taking methotrexate for 2 consecutive weeks but discontinued due to the concern of having to start on meloxicam and continuing use with Norco.  Patient plans on resuming methotrexate once she has discontinued NSAID use and her pain level has been better controlled in the left knee.   Activities of Daily Living:  Patient reports morning stiffness for 68mns to 1 hour.   Patient Denies nocturnal pain.  Difficulty dressing/grooming: Reports Difficulty climbing stairs: Reports Difficulty getting out of chair: Reports Difficulty using hands for taps, buttons, cutlery, and/or writing: Reports  Review of Systems  Constitutional:  Positive for fatigue.  HENT:  Positive for mouth dryness. Negative for mouth sores.   Eyes:  Positive for dryness.  Respiratory:  Negative for shortness of breath.   Cardiovascular:  Negative for chest pain and palpitations.  Gastrointestinal:  Negative for blood in stool,  constipation and diarrhea.  Endocrine: Negative for increased urination.  Genitourinary:  Negative for involuntary urination.  Musculoskeletal:  Positive for joint pain, gait problem, joint pain, joint swelling, myalgias, muscle weakness, morning stiffness, muscle tenderness and myalgias.  Skin:  Positive for hair loss. Negative for color change, rash and sensitivity to sunlight.  Allergic/Immunologic: Negative for susceptible to infections.  Neurological:  Positive for headaches. Negative for dizziness.  Hematological:  Negative for swollen glands.  Psychiatric/Behavioral:  Negative for depressed mood and sleep disturbance. The patient is not nervous/anxious.     PMFS History:  Patient Active Problem List   Diagnosis Date Noted  . Primary osteoarthritis of both hands 10/31/2021  . Fibromyalgia 10/24/2021  . [place]'s syndrome ([place] 10/24/2021  . DDD (degenerative disc disease), lumbar 10/14/2021  . Insomnia 02/14/2021  . Prediabetes 12/31/2020  . HLD (hyperlipidemia) 12/31/2020  . Frequent headaches 12/31/2020  . Morbid obesity ([place] 12/31/2020  . Gastroesophageal reflux disease without esophagitis 11/15/2018  . Anxiety and depression 08/03/2014    Past Medical History:  Diagnosis Date  . Allergy   . Anemia    h/o  . Anxiety   . DDD (degenerative disc disease), lumbar   . Depression   . Fibromyalgia   . GERD (gastroesophageal reflux disease)   . Morbid obesity ([place]   . Pneumonia 01/2022  . PONV (postoperative nausea and vomiting)   . Pre-diabetes   . S[place]ogren's syndrome ([place]     Family History  Problem Relation Age of Onset  . Hyperlipidemia Mother   . Rheum arthritis Sister   . Osteoarthritis Sister   . Fibroids Sister   . Fibroids Sister   . Fibroids Sister   . Heart disease Maternal Aunt   . Stroke Maternal Aunt   . Diabetes Maternal Aunt   . Heart disease Maternal Uncle   . Stroke Maternal Uncle   . Rheum arthritis Nephew   . Cancer Neg Hx    Past  Surgical History:  Procedure Laterality Date  . ABDOMINAL HYSTERECTOMY  2008   LAVH, endometriosis ablation, mccall culdoplasty  . CHOLECYSTECTOMY N/A 08/31/2019   Procedure: LAPAROSCOPIC CHOLECYSTECTOMY;  Surgeon: Olean Ree, MD;  Location: [facility] ORS;  Service: General;  Laterality: N/A;  . DIAGNOSTIC LAPAROSCOPY  2002   Hysteroscopy, diagnostic laparoscopy, extensive lysis of adhesions, endometrial ablation, RSO  . RIGHT OOPHORECTOMY  2007   with right salpingectomy  . TOTAL KNEE ARTHROPLASTY 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   Procedure: Partial KNEE ARTHROPLASTY;  Surgeon: Lovell Sheehan, MD;  Location: [facility] ORS;  Service: Orthopedics;  Laterality: Left;  . TUBAL LIGATION     Social History   Social History Narrative  . Not on file   Immunization History  Administered Date(s) Administered  . Influenza,inj,Quad PF,6+ Mos 03/17/2015, 03/17/2017, 02/25/2019, 03/19/2020, 03/28/2021  . PFIZER(Purple Top)SARS-COV-2 Vaccination 07/11/2019, 08/01/2019  . Tdap 02/09/2007, 09/18/2016     Objective: Vital Signs: BP 111/81 (BP Location: Left Arm, Patient Position: Sitting, Cuff Size: Large)   Pulse 89   Ht 5' 5"$  (1.651 m)   Wt 260 lb (117.9 kg)   BMI 43.27 kg/m    Physical Exam Vitals and nursing note reviewed.  Constitutional:      Appearance: She is well-developed.  HENT:     Head: Normocephalic and atraumatic.  Eyes:     Conjunctiva/sclera: Conjunctivae normal.  Cardiovascular:     Rate and Rhythm: Normal rate and regular rhythm.     Heart sounds: Normal heart sounds.  Pulmonary:     Effort: Pulmonary effort is normal.     Breath sounds: Normal breath sounds.  Abdominal:     General: Bowel sounds are normal.     Palpations: Abdomen is soft.  Musculoskeletal:     Cervical back: Normal range of motion.  Skin:    General: Skin is warm and dry.     Capillary Refill: Capillary refill takes less than 2 seconds.  Neurological:     Mental Status: She is alert and oriented to person,  place, and time.  Psychiatric:        Behavior: Behavior normal.     Musculoskeletal Exam: Generalized hyperalgesia and positive tender points on exam.  C-spine has limited range of motion with lateral rotation.  Trapezius muscle tension and tenderness bilaterally.  Painful range of motion of the lumbar spine.  Shoulder joints have good range of motion with some stiffness bilaterally.  Elbow joints have good range of motion with no tenderness or synovitis.  Tenderness over both wrist joints and several MCP joints.  Complete fist formation bilaterally.  Hip joints have good range of motion.  Tenderness over bilateral trochanteric bursa.  Right knee has good range of motion with no warmth or effusion.  Left partial knee replacement has warmth and limited flexion extension. Ankle joints have good ROM.   CDAI Exam: CDAI Score: -- Patient Global: --; Provider Global: -- Swollen: --; Tender: -- Joint Exam 08/06/2022   No joint exam has been documented for this  visit   There is currently no information documented on the homunculus. Go to the Rheumatology activity and complete the homunculus joint exam.  Investigation: No additional findings.  Imaging: DG BONE DENSITY (DXA)  Result Date: 07/29/2022 EXAM: DUAL X-RAY ABSORPTIOMETRY (DXA) FOR BONE MINERAL DENSITY IMPRESSION: Referring Physician:  Ofilia Neas Your patient completed a bone mineral density test using GE Lunar iDXA system (analysis version: 16). Technologist: ALW PATIENT: Name: Allean, Schwiebert Patient ID: [id number] Birth Date: [birth year] Height: 65.0 in. Sex: Female Measured: 07/29/2022 Weight: 273.4 lbs. Indications: Estrogen Deficiency, History of Fracture (Adult), Hysterectomy, Methotrexate, Pepcid, Post Menopausal, Right Ovariectomy Fractures: Foot Treatments: Vitamin D ASSESSMENT: The BMD measured at DualFemur Neck Left is 0.978 g/cm2 with a T-score of -0.4. This patient is considered normal according to [place]  [facility]) criteria. The scan quality is good. L-3 and L-4 were excluded due to degenerative changes. Site Region Measured Date Measured Age YA BMD Significant CHANGE T-score DualFemur Neck Left  07[age]29/2022    56.3         -0.4    0.978 g/cm2 AP Spine  L1-L2      07/29/2022    56.3         -0.1    1.162 g/cm2 DualFemur Total Mean 07/29/2022    56.3         -0.2    0.981 g/cm2 World Health Organization [facility]) criteria for post-menopausal, Caucasian Women: Normal       T-score at or above -1 SD Osteopenia   T-score between -1 and -2.5 SD Osteoporosis T-score at or below -2.5 SD RECOMMENDATION: 1. All patients should optimize calcium and vitamin D intake. 2. Consider FDA approved medical therapies in postmenopausal women and men aged 53 years and older, based on the following: a. A hip or vertebral (clinical or morphometric) fracture b. T-score = -2.5 at the femoral neck or spine after appropriate evaluation to exclude secondary causes c. Low bone mass (T-score between -1.0 and -2.5 at the femoral neck or spine) and a 10- year probability of a hip fracture = 3% or a 10 year probability of a major osteoporosis-related fracture = 20% based on the [place]-adapted WHO algorithm. 3. Clinician judgement and/or patient preference may indicate treatment for people with10-year fracture probabilities above or below these levels. FOLLOW-UP: Patients with diagnosis of osteoporosis or at high risk for fracture should have regular bone mineral density tests. For patients eligible for Medicare routine testing is allowed once every 2 years. The testing frequency can be increased to one year for patients who have rapidly progressing disease, those who are receiving or discontinuing medical therapy to restore bone mass, or have additional risk factors. I have reviewed this study and agree with the findings. [facility] Electronically Signed   By: Zerita Boers M.D.   On: 07/29/2022 11:19    Recent Labs: Lab Results  Component  Value Date   WBC 5.1 04/17/2022   HGB 12.9 04/17/2022   PLT 241 04/17/2022   NA 142 04/17/2022   K 3.9 04/17/2022   CL 109 04/17/2022   CO2 25 04/17/2022   GLUCOSE 133 (H) 04/17/2022   BUN 16 04/17/2022   CREATININE 0.79 04/17/2022   BILITOT 0.4 04/17/2022   ALKPHOS 62 03/19/2020   AST 10 04/17/2022   ALT 12 04/17/2022   PROT 6.8 04/17/2022   ALBUMIN 3.9 03/19/2020   CALCIUM 9.3 04/17/2022   GFRAA >60 12/01/2019   QFTBGOLDPLUS NEGATIVE 04/17/2022  Speciality Comments: No specialty comments available.  Procedures:  No procedures performed Allergies: Gabapentin, Hydrochlorothiazide, Lipitor [atorvastatin], Penicillins, and Orange juice [orange oil]     Assessment / Plan:     Visit Diagnoses: Sjogren's syndrome with keratoconjunctivitis sicca (HCC) - Positive ANA, positive SSA, sicca symptoms, arthralgias: Sicca symptoms remain unchanged.  Use of over-the-counter products discussed today.  Patient continues to experience pain involving multiple joints.  The plan was for her to initiate methotrexate along with folic acid but she has had several gaps in therapy.  She underwent a left partial knee replacement on 06/03/2022 and has had a slower recovery than expected.  She tried initiating methotrexate for 2 consecutive weeks but discontinued due to her pain level being out of control and having to start on meloxicam 15 mg daily along with Norco for pain relief.  She plans on reinitiating methotrexate once she is able to come off of meloxicam. CBC and CMP will be drawn today.  Discussed that once she reinitiates methotrexate she will need updated lab work in 2 weeks x 2, 2 months, then every 3 months. Plan to update Sjogren's lab work at her next follow-up visit in 3 months.  Inflammatory arthritis - Anti-CCP and rheumatoid factor negative on 10/14/2021.  Ultrasound of both hands was positive for synovitis on 02/12/2022.  Patient continues to have ongoing pain and inflammation in both  hands and both wrist joints.  Plan to initiate methotrexate once she is able to discontinue Norco and meloxicam. She plans on restarting methotrexate 6 tablets by mouth once weekly x 2 weeks and if labs are stable she will increase back to 8 tablets once weekly.  She is aware that she will require lab work in 2 weeks x 2, 2 months, then every 3 months after reinitiating methotrexate.  High risk medication use - Methotrexate 8 tablets by mouth once weekly and folic acid 2 mg daily.  CBC and CMP were drawn on 04/17/2022.  Orders for CBC and CMP were released today.  Patient was advised to have updated lab work 2 weeks x 2, 2 months, then every 3 months once she restarts on methotrexate. Discussed the importance of holding methotrexate in the futuer if she develops signs or symptoms of an infection and to resume once the infection has completely cleared. - Plan: CBC with Differential/Platelet, COMPLETE METABOLIC PANEL WITH GFR  Primary osteoarthritis of both hands: PIP and DIP thickening consistent with OA of both hands.  Chronic pain.  Difficulty performing ADLs due to the severity of pain in her hands and wrist joints.  Patient is planning on applying for disability.  Paresthesia of both hands - Bilateral median nerves are within normal limits.    Trochanteric bursitis of both hips: She has tenderness over bilateral trochanteric bursa.  Discussed the importance of performing stretching exercises.  Primary osteoarthritis of right knee: She has good ROM with no discomfort.  No warmth or effusion of the right knee.   Status post left knee replacement: Partial-06/03/22 Performed by Dr. Harlow Mares. Going to PT twice weekly.  She has been having a difficult recovery and was started on meloxicam 15 mg 1 tablet daily for pain relief along with Norco to help better manage her symptoms.  She continues to use a cane to assist with ambulation.  Her activities have been very limited due to the severity of pain.  DDD  (degenerative disc disease), lumbar: Chronic pain.  Patient is applying for disability.   Fibromyalgia: Patient continues to have  generalized myalgias and muscle tenderness due to fibromyalgia.  She has generalized hyperalgesia on examination today.  She has difficulty carrying out ADLs and exercising due to the severity of her pain.  Plan on restarting Cymbalta 30 mg 1 capsule daily.  A refill was sent to the pharmacy today.   Anxiety and depression: Plan to restart Cymbalta 30 mg 1 capsule daily.  A refill of Cymbalta was sent to the pharmacy today.  Osteoporosis screening: DEXA updated on 07/29/22: The BMD measured at DualFemur Neck Left is 0.978 g/cm2 with a T-score of -0.4. repeat DEXA in 2 years due to history of prednisone use and recent right foot fracture.   Other medical conditions are listed as follows:   Adjustment insomnia  Hypermobility of joint  Frequent headaches  Pure hypertriglyceridemia  Prediabetes  Gastroesophageal reflux disease without esophagitis  Seasonal allergies  Family history of rheumatoid arthritis- two sisters and nephew  Orders: Orders Placed This Encounter  Procedures  . CBC with Differential/Platelet  . COMPLETE METABOLIC PANEL WITH GFR   Meds ordered this encounter  Medications  . DULoxetine (CYMBALTA) 30 MG capsule    Sig: Take 1 capsule (30 mg total) by mouth daily.    Dispense:  90 capsule    Refill:  0     Follow-Up Instructions: Return in about 3 months (around 11/04/2022) for Sjogren's syndrome.   Ofilia Neas, PA-C  Note - This record has been created using Dragon software.  Chart creation errors have been sought, but may not always  have been located. Such creation errors do not reflect on  the standard of medical care.

## 2022-07-24 NOTE — Telephone Encounter (Signed)
Refill request received via fax from [place] for MTX and Folic Acid.  Next Visit: 08/06/2022  Last Visit: 05/22/2022  DX: Sjogren's syndrome with keratoconjunctivitis sicca   Current Dose per my chart note 07/10/2022: methotrexate 6 tablets by mouth once weekly x2 weeks and if labs are stable increase to 8 tablets by mouth once weekly. folic acid 2 mg daily.   Labs: 04/17/2022 glucose is 133. Rest of CMP WNL.  CBC WNL.   Okay to refill MTX and Folic Acid?

## 2022-07-29 ENCOUNTER — Ambulatory Visit (HOSPITAL_BASED_OUTPATIENT_CLINIC_OR_DEPARTMENT_OTHER)
Admission: RE | Admit: 2022-07-29 | Discharge: 2022-07-29 | Disposition: A | Discharge status: Home / Self Care | Payer: Commercial Managed Care - HMO | Source: Ambulatory Visit | Attending: Physician Assistant | Admitting: Physician Assistant

## 2022-07-29 DIAGNOSIS — Z13820 Encounter for screening for osteoporosis: Secondary | ICD-10-CM | POA: Diagnosis present

## 2022-07-29 NOTE — Progress Notes (Signed)
DEXA normal-repeat in 5 years.

## 2022-07-31 ENCOUNTER — Other Ambulatory Visit: Payer: Self-pay | Admitting: Internal Medicine

## 2022-07-31 DIAGNOSIS — K219 Gastro-esophageal reflux disease without esophagitis: Secondary | ICD-10-CM

## 2022-07-31 NOTE — Telephone Encounter (Signed)
Pharmacy comment: Script Clarification:INSURANCE WILL ONLY COVER 1 TAB PER DAY.

## 2022-07-31 NOTE — Telephone Encounter (Signed)
Requested Prescriptions  Pending Prescriptions Disp Refills   pantoprazole (PROTONIX) 40 MG tablet [Pharmacy Med Name: PANTOPRAZOLE SOD DR 40 MG TAB] 180 tablet 0    Sig: TAKE 1 TABLET BY MOUTH TWICE A DAY     Gastroenterology: Proton Pump Inhibitors Passed - 07/31/2022  1:35 AM      Passed - Valid encounter within last 12 months    Recent Outpatient Visits           1 week ago DDD (degenerative disc disease), lumbar   [facility] [place], Coralie Keens, NP   6 months ago Community acquired pneumonia of left lower lobe of lung   [facility], Coralie Keens, NP   9 months ago [facility], Coralie Keens, NP   1 year ago [facility] [place], Coralie Keens, NP   1 year ago Encounter for completion of form with patient   [facility] [place], Coralie Keens, NP       Future Appointments             In 6 days [facility]   In 1 week [place], NP [facility]

## 2022-08-06 ENCOUNTER — Encounter: Payer: Self-pay | Admitting: Physician Assistant

## 2022-08-06 ENCOUNTER — Ambulatory Visit: Payer: Commercial Managed Care - HMO | Attending: Physician Assistant | Admitting: Physician Assistant

## 2022-08-06 VITALS — BP 111/81 | HR 89 | Ht 65.0 in | Wt 260.0 lb

## 2022-08-06 DIAGNOSIS — F5102 Adjustment insomnia: Secondary | ICD-10-CM

## 2022-08-06 DIAGNOSIS — M3501 Sicca syndrome with keratoconjunctivitis: Secondary | ICD-10-CM

## 2022-08-06 DIAGNOSIS — M170 Bilateral primary osteoarthritis of knee: Secondary | ICD-10-CM

## 2022-08-06 DIAGNOSIS — M19041 Primary osteoarthritis, right hand: Secondary | ICD-10-CM | POA: Diagnosis not present

## 2022-08-06 DIAGNOSIS — Z79899 Other long term (current) drug therapy: Secondary | ICD-10-CM | POA: Diagnosis not present

## 2022-08-06 DIAGNOSIS — M1990 Unspecified osteoarthritis, unspecified site: Secondary | ICD-10-CM | POA: Diagnosis not present

## 2022-08-06 DIAGNOSIS — M5136 Other intervertebral disc degeneration, lumbar region: Secondary | ICD-10-CM

## 2022-08-06 DIAGNOSIS — R202 Paresthesia of skin: Secondary | ICD-10-CM

## 2022-08-06 DIAGNOSIS — M797 Fibromyalgia: Secondary | ICD-10-CM

## 2022-08-06 DIAGNOSIS — Z13820 Encounter for screening for osteoporosis: Secondary | ICD-10-CM

## 2022-08-06 DIAGNOSIS — F419 Anxiety disorder, unspecified: Secondary | ICD-10-CM

## 2022-08-06 DIAGNOSIS — R7303 Prediabetes: Secondary | ICD-10-CM

## 2022-08-06 DIAGNOSIS — J302 Other seasonal allergic rhinitis: Secondary | ICD-10-CM

## 2022-08-06 DIAGNOSIS — M249 Joint derangement, unspecified: Secondary | ICD-10-CM

## 2022-08-06 DIAGNOSIS — M7061 Trochanteric bursitis, right hip: Secondary | ICD-10-CM

## 2022-08-06 DIAGNOSIS — Z8261 Family history of arthritis: Secondary | ICD-10-CM

## 2022-08-06 DIAGNOSIS — M19042 Primary osteoarthritis, left hand: Secondary | ICD-10-CM

## 2022-08-06 DIAGNOSIS — K219 Gastro-esophageal reflux disease without esophagitis: Secondary | ICD-10-CM

## 2022-08-06 DIAGNOSIS — M7062 Trochanteric bursitis, left hip: Secondary | ICD-10-CM

## 2022-08-06 DIAGNOSIS — R519 Headache, unspecified: Secondary | ICD-10-CM

## 2022-08-06 DIAGNOSIS — E781 Pure hyperglyceridemia: Secondary | ICD-10-CM

## 2022-08-06 DIAGNOSIS — Z96652 Presence of left artificial knee joint: Secondary | ICD-10-CM

## 2022-08-06 DIAGNOSIS — F32A Depression, unspecified: Secondary | ICD-10-CM

## 2022-08-06 DIAGNOSIS — M51369 Other intervertebral disc degeneration, lumbar region without mention of lumbar back pain or lower extremity pain: Secondary | ICD-10-CM

## 2022-08-06 MED ORDER — DULOXETINE HCL 30 MG PO CPEP: 30.0000 mg | ORAL_CAPSULE | Freq: Every day | ORAL | 0 refills | Status: DC

## 2022-08-07 LAB — COMPLETE METABOLIC PANEL WITH GFR
AG Ratio: 1.3 (calc) (ref 1.0–2.5)
ALT: 14 U/L (ref 6–29)
AST: 14 U/L (ref 10–35)
Albumin: 4.5 g/dL (ref 3.6–5.1)
Alkaline phosphatase (APISO): 60 U/L (ref 37–153)
BUN: 10 mg/dL (ref 7–25)
CO2: 25 mmol/L (ref 20–32)
Calcium: 10.4 mg/dL (ref 8.6–10.4)
Chloride: 109 mmol/L (ref 98–110)
Creat: 0.68 mg/dL (ref 0.50–1.03)
Globulin: 3.4 g/dL (calc) (ref 1.9–3.7)
Glucose, Bld: 100 mg/dL — ABNORMAL HIGH (ref 65–99)
Potassium: 3.9 mmol/L (ref 3.5–5.3)
Sodium: 142 mmol/L (ref 135–146)
Total Bilirubin: 0.6 mg/dL (ref 0.2–1.2)
Total Protein: 7.9 g/dL (ref 6.1–8.1)
eGFR: 102 mL/min/{1.73_m2} (ref >=60)

## 2022-08-07 LAB — CBC WITH DIFFERENTIAL/PLATELET
Absolute Monocytes: 310 cells/uL (ref 200–950)
Basophils Absolute: 9 cells/uL (ref 0–200)
Basophils Relative: 0.2 %
Eosinophils Absolute: 39 cells/uL (ref 15–500)
Eosinophils Relative: 0.9 %
HCT: 39.2 % (ref 35.0–45.0)
Hemoglobin: 13.4 g/dL (ref 11.7–15.5)
Lymphs Abs: 1161 cells/uL (ref 850–3900)
MCH: 31 pg (ref 27.0–33.0)
MCHC: 34.2 g/dL (ref 32.0–36.0)
MCV: 90.7 fL (ref 80.0–100.0)
MPV: 11.8 fL (ref 7.5–12.5)
Monocytes Relative: 7.2 %
Neutro Abs: 2782 cells/uL (ref 1500–7800)
Neutrophils Relative %: 64.7 %
Platelets: 205 10*3/uL (ref 140–400)
RBC: 4.32 10*6/uL (ref 3.80–5.10)
RDW: 13.2 % (ref 11.0–15.0)
Total Lymphocyte: 27 %
WBC: 4.3 10*3/uL (ref 3.8–10.8)

## 2022-08-07 NOTE — Progress Notes (Signed)
CBC and CMP WNL

## 2022-08-12 ENCOUNTER — Encounter: Payer: Self-pay | Admitting: Internal Medicine

## 2022-08-20 ENCOUNTER — Encounter: Payer: Self-pay | Admitting: Internal Medicine

## 2022-09-09 ENCOUNTER — Other Ambulatory Visit: Payer: Self-pay | Admitting: Internal Medicine

## 2022-09-09 DIAGNOSIS — K219 Gastro-esophageal reflux disease without esophagitis: Secondary | ICD-10-CM

## 2022-09-09 NOTE — Telephone Encounter (Signed)
Pt last had VV with R Baity NP on 07/23/22; sending diclofenac refill to Avie Echevaria NP.

## 2022-10-07 ENCOUNTER — Other Ambulatory Visit: Payer: Self-pay | Admitting: Internal Medicine

## 2022-10-07 ENCOUNTER — Ambulatory Visit (INDEPENDENT_AMBULATORY_CARE_PROVIDER_SITE_OTHER): Payer: Commercial Managed Care - HMO | Admitting: Internal Medicine

## 2022-10-07 ENCOUNTER — Encounter: Payer: Self-pay | Admitting: Internal Medicine

## 2022-10-07 VITALS — BP 122/80 | HR 103 | Ht 65.0 in | Wt 270.0 lb

## 2022-10-07 DIAGNOSIS — K219 Gastro-esophageal reflux disease without esophagitis: Secondary | ICD-10-CM

## 2022-10-07 DIAGNOSIS — Z1231 Encounter for screening mammogram for malignant neoplasm of breast: Secondary | ICD-10-CM

## 2022-10-07 DIAGNOSIS — Z0001 Encounter for general adult medical examination with abnormal findings: Secondary | ICD-10-CM

## 2022-10-07 DIAGNOSIS — Z6841 Body Mass Index (BMI) 40.0 and over, adult: Secondary | ICD-10-CM

## 2022-10-07 DIAGNOSIS — R7303 Prediabetes: Secondary | ICD-10-CM

## 2022-10-07 MED ORDER — PANTOPRAZOLE SODIUM 40 MG PO TBEC: 40.0000 mg | DELAYED_RELEASE_TABLET | Freq: Two times a day (BID) | ORAL | 1 refills | Status: DC

## 2022-10-07 NOTE — Progress Notes (Signed)
Subjective:    Patient ID: [name], female    DOB: [birth year], 57 y.o.   MRN: [id number]  HPI  Patient presents to clinic today for her annual exam.  Flu: 03/2021 Tetanus: 09/2016 COVID: Pfizer x 2 Shingrix: Never Pap smear: Hysterectomy Mammogram: 03/2019 Bone density: 07/2022 Colon screening: 03/2020 Vision screening: annually Dentist: as needed  Diet: She does eat lean meat. She consumes fruits and veggies. She does eat some fried foods. She drinks mostly water, juice Exercise: None  Review of Systems     Past Medical History:  Diagnosis Date   Allergy    Anemia    h/o   Anxiety    DDD (degenerative disc disease), lumbar    Depression    Fibromyalgia    GERD (gastroesophageal reflux disease)    Morbid obesity (HCC)    Pneumonia 01/2022   PONV (postoperative nausea and vomiting)    Pre-diabetes    Sjogren's syndrome (HCC)     Current Outpatient Medications  Medication Sig Dispense Refill   acetaminophen (TYLENOL 8 HOUR ARTHRITIS PAIN) 650 MG CR tablet Take 1,300 mg by mouth daily.     ALLEGRA-D ALLERGY & CONGESTION 180-240 MG 24 hr tablet TAKE 1 TABLET BY MOUTH EVERY DAY AS NEEDED (Patient taking differently: 1 tablet as needed.) 30 tablet 1   benzonatate (TESSALON) 100 MG capsule Take 1 capsule (100 mg total) by mouth 3 (three) times daily as needed for cough. (Patient not taking: Reported on 08/06/2022) 30 capsule 0   diclofenac Sodium (VOLTAREN) 1 % GEL APPLY 2 GRAMS TO AFFECTED AREA 4 TIMES A DAY (Patient taking differently: 2 g as needed.) 100 g 0   docusate sodium (COLACE) 100 MG capsule Take 1 capsule (100 mg total) by mouth 2 (two) times daily. 30 capsule 0   doxycycline (VIBRA-TABS) 100 MG tablet Take 1 tablet (100 mg total) by mouth 2 (two) times daily. (Patient not taking: Reported on 08/06/2022) 20 tablet 0   DULoxetine (CYMBALTA) 30 MG capsule Take 1 capsule (30 mg total) by mouth daily. 90 capsule 0   eszopiclone (LUNESTA) 2 MG TABS tablet  Take 1 tablet (2 mg total) by mouth at bedtime as needed for sleep. Take immediately before bedtime 30 tablet 0   famotidine (PEPCID) 20 MG tablet Take 1 tablet (20 mg total) by mouth 2 (two) times daily as needed for heartburn or indigestion. 180 tablet 1   folic acid (FOLVITE) 1 MG tablet Take 2 tablets (2 mg total) by mouth daily. (Patient not taking: Reported on 08/06/2022) 180 tablet 3   HYDROcodone-acetaminophen (NORCO/VICODIN) 5-325 MG tablet Take 1 tablet by mouth every 4 (four) hours as needed for moderate pain (pain score 4-6). 30 tablet 0   hydrOXYzine (VISTARIL) 25 MG capsule Take 1 capsule (25 mg total) by mouth daily as needed. (Patient not taking: Reported on 08/06/2022) 90 capsule 1   meloxicam (MOBIC) 15 MG tablet Take 15 mg by mouth daily.     methocarbamol (ROBAXIN) 500 MG tablet Take 500 mg by mouth at bedtime.     methocarbamol (ROBAXIN) 500 MG tablet Take 1 tablet (500 mg total) by mouth every 8 (eight) hours as needed for muscle spasms. 60 tablet 1   methotrexate (RHEUMATREX) 2.5 MG tablet Take 6 tabs po weekly x 2 weeks. If labs are stable increase to 8 tabs po weekly.  Caution:Chemotherapy. Protect from light. (Patient not taking: Reported on 08/06/2022) 28 tablet 0   ondansetron (ZOFRAN) 4 MG tablet  Take 1 tablet (4 mg total) by mouth every 8 (eight) hours as needed for nausea or vomiting. (Patient not taking: Reported on 08/06/2022) 30 tablet 0   pantoprazole (PROTONIX) 40 MG tablet TAKE 1 TABLET BY MOUTH TWICE A DAY 180 tablet 0   predniSONE (DELTASONE) 20 MG tablet Take 2 tablets (40 mg total) by mouth daily with breakfast. (Patient not taking: Reported on 08/06/2022) 10 tablet 0   VITAMIN D PO Take 1 tablet by mouth once a week.     No current facility-administered medications for this visit.    Allergies  Allergen Reactions   Gabapentin Other (See Comments)    Hair loss   Hydrochlorothiazide Other (See Comments)    The pt state she couldn't function and felt like she  was dying    Lipitor [Atorvastatin]     Muscle pain   Penicillins Other (See Comments)    Childhood allergy Has patient had a PCN reaction causing immediate rash, facial/tongue/throat swelling, SOB or lightheadedness with hypotension: Unknown Has patient had a PCN reaction causing severe rash involving mucus membranes or skin necrosis: Unknown Has patient had a PCN reaction that required hospitalization: No  Has patient had a PCN reaction occurring within the last 10 years: No  If all of the above answers are "NO", then may proceed with Cephalosporin use.    Orange Juice [Orange Oil] Rash    rash    Family History  Problem Relation Age of Onset   Hyperlipidemia Mother    Rheum arthritis Sister    Osteoarthritis Sister    Fibroids Sister    Fibroids Sister    Fibroids Sister    Heart disease Maternal Aunt    Stroke Maternal Aunt    Diabetes Maternal Aunt    Heart disease Maternal Uncle    Stroke Maternal Uncle    Rheum arthritis Nephew    Cancer Neg Hx     Social History   Socioeconomic History   Marital status: Married    Spouse name: Not on file   Number of children: Not on file   Years of education: Not on file   Highest education level: Not on file  Occupational History   Not on file  Tobacco Use   Smoking status: Never    Passive exposure: Current   Smokeless tobacco: Never  Vaping Use   Vaping Use: Never used  Substance and Sexual Activity   Alcohol use: Yes    Comment: occ   Drug use: Not Currently    Types: Marijuana    Comment: has tried   Sexual activity: Yes    Partners: Male    Birth control/protection: Post-menopausal, Surgical  Other Topics Concern   Not on file  Social History Narrative   Not on file   Social Determinants of Health   Financial Resource Strain: Not on file  Food Insecurity: No Food Insecurity (06/03/2022)   Hunger Vital Sign    Worried About Running Out of Food in the Last Year: Never true    Ran Out of Food in the Last  Year: Never true  Transportation Needs: No Transportation Needs (06/03/2022)   PRAPARE - Administrator, Civil Service (Medical): No    Lack of Transportation (Non-Medical): No  Physical Activity: Not on file  Stress: Not on file  Social Connections: Not on file  Intimate Partner Violence: Not At Risk (06/03/2022)   Humiliation, Afraid, Rape, and Kick questionnaire    Fear of Current or  Ex-Partner: No    Emotionally Abused: No    Physically Abused: No    Sexually Abused: No     Constitutional: Patient reports intermittent headaches.  Denies fever, malaise, fatigue, or abrupt weight changes.  HEENT: Denies eye pain, eye redness, ear pain, ringing in the ears, wax buildup, runny nose, nasal congestion, bloody nose, or sore throat. Respiratory: Denies difficulty breathing, shortness of breath, cough or sputum production.   Cardiovascular: Denies chest pain, chest tightness, palpitations or swelling in the hands or feet.  Gastrointestinal: Denies abdominal pain, bloating, constipation, diarrhea or blood in the stool.  GU: Denies urgency, frequency, pain with urination, burning sensation, blood in urine, odor or discharge. Musculoskeletal: Patient reports chronic joint and muscle pain.  Denies decrease in range of motion, difficulty with gait, or joint swelling.  Skin: Denies redness, rashes, lesions or ulcercations.  Neurological: Patient reports insomnia.  Denies dizziness, difficulty with memory, difficulty with speech or problems with balance and coordination.  Psych: Patient has a history of anxiety and depression.  Denies SI/HI.  No other specific complaints in a complete review of systems (except as listed in HPI above).  Objective:   Physical Exam  BP 122/80 (BP Location: Right Arm, Patient Position: Sitting)   Pulse (!) 103   Ht  (1.651 m)   Wt 270 lb (122.5 kg)   SpO2 96%   BMI 44.93 kg/m   Wt Readings from Last 3 Encounters:  08/06/22 260 lb (117.9 kg)   06/03/22 273 lb 5.9 oz (124 kg)  05/27/22 273 lb 5.9 oz (124 kg)    General: Appears her stated age, obese, in NAD. Skin: Warm, dry and intact.  HEENT: Head: normal shape and size; Eyes: sclera white, no icterus, conjunctiva pink, PERRLA and EOMs intact;  Neck:  Neck supple, trachea midline. No masses, lumps or thyromegaly present.  Cardiovascular: Tachycardic with normal rhythm. S1,S2 noted.  No murmur, rubs or gallops noted. No JVD or BLE edema. No carotid bruits noted. Pulmonary/Chest: Normal effort and positive vesicular breath sounds. No respiratory distress. No wheezes, rales or ronchi noted.  Abdomen: Normal bowel sounds.  Musculoskeletal: Strength 5/5 BUE/BLE. Limping gait. Neurological: Alert and oriented. Cranial nerves II-XII grossly intact. Coordination normal.  Psychiatric: Mood and affect normal. Mildly anxious appearing. Judgment and thought content normal.     BMET    Component Value Date/Time   NA 142 08/06/2022 1334   NA 142 09/18/2016 0929   K 3.9 08/06/2022 1334   CL 109 08/06/2022 1334   CO2 25 08/06/2022 1334   GLUCOSE 100 (H) 08/06/2022 1334   BUN 10 08/06/2022 1334   BUN 9 09/18/2016 0929   CREATININE 0.68 08/06/2022 1334   CALCIUM 10.4 08/06/2022 1334   GFRNONAA >60 01/15/2022 2334   GFRAA >60 12/01/2019 1558    Lipid Panel     Component Value Date/Time   CHOL 236 (H) 03/28/2021 0838   CHOL 200 (H) 03/23/2017 0844   TRIG 88 03/28/2021 0838   HDL 73 03/28/2021 0838   HDL 57 03/23/2017 0844   CHOLHDL 3.2 03/28/2021 0838   VLDL 36.2 03/19/2020 0918   LDLCALC 144 (H) 03/28/2021 0838    CBC    Component Value Date/Time   WBC 4.3 08/06/2022 1334   RBC 4.32 08/06/2022 1334   HGB 13.4 08/06/2022 1334   HGB 13.2 09/18/2016 0929   HCT 39.2 08/06/2022 1334   HCT 38.1 09/18/2016 0929   PLT 205 08/06/2022 1334   PLT 268  09/18/2016 0929   MCV 90.7 08/06/2022 1334   MCV 94 09/18/2016 0929   MCH 31.0 08/06/2022 1334   MCHC 34.2 08/06/2022 1334    RDW 13.2 08/06/2022 1334   RDW 12.8 09/18/2016 0929   LYMPHSABS 1,161 08/06/2022 1334   LYMPHSABS 2.8 08/03/2014 1609   MONOABS 0.6 08/30/2019 0315   EOSABS 39 08/06/2022 1334   EOSABS 0.1 08/03/2014 1609   BASOSABS 9 08/06/2022 1334   BASOSABS 0.0 08/03/2014 1609    Hgb A1C Lab Results  Component Value Date   HGBA1C 5.8 (H) 03/28/2021           Assessment & Plan:   Preventative Health Maintenance:  Encouraged her to get a flu shot in the fall Tetanus UTD Encouraged her to get her COVID booster Discussed Shingrix vaccine, she will check coverage with her insurance company and schedule a visit if she would like to have this done She no longer needs Pap smears Mammogram ordered-she will call to schedule Bone density UTD  colon screening UTD Encouraged her to consume a balanced diet and exercise regimen Advised her to see an eye doctor and dentist annually Will check CBC, c-Met, lipid, A1c today  RTC in 6 months, follow-up chronic conditions Nicki Reaper, NP

## 2022-10-07 NOTE — Patient Instructions (Signed)
Health Maintenance for Postmenopausal Women Menopause is a normal process in which your ability to get pregnant comes to an end. This process happens slowly over many months or years, usually between the ages of 48 and 55. Menopause is complete when you have missed your menstrual period for 12 months. It is important to talk with your health care provider about some of the most common conditions that affect women after menopause (postmenopausal women). These include heart disease, cancer, and bone loss (osteoporosis). Adopting a healthy lifestyle and getting preventive care can help to promote your health and wellness. The actions you take can also lower your chances of developing some of these common conditions. What are the signs and symptoms of menopause? During menopause, you may have the following symptoms: Hot flashes. These can be moderate or severe. Night sweats. Decrease in sex drive. Mood swings. Headaches. Tiredness (fatigue). Irritability. Memory problems. Problems falling asleep or staying asleep. Talk with your health care provider about treatment options for your symptoms. Do I need hormone replacement therapy? Hormone replacement therapy is effective in treating symptoms that are caused by menopause, such as hot flashes and night sweats. Hormone replacement carries certain risks, especially as you become older. If you are thinking about using estrogen or estrogen with progestin, discuss the benefits and risks with your health care provider. How can I reduce my risk for heart disease and stroke? The risk of heart disease, heart attack, and stroke increases as you age. One of the causes may be a change in the body's hormones during menopause. This can affect how your body uses dietary fats, triglycerides, and cholesterol. Heart attack and stroke are medical emergencies. There are many things that you can do to help prevent heart disease and stroke. Watch your blood pressure High  blood pressure causes heart disease and increases the risk of stroke. This is more likely to develop in people who have high blood pressure readings or are overweight. Have your blood pressure checked: Every 3-5 years if you are 18-39 years of age. Every year if you are 40 years old or older. Eat a healthy diet  Eat a diet that includes plenty of vegetables, fruits, low-fat dairy products, and lean protein. Do not eat a lot of foods that are high in solid fats, added sugars, or sodium. Get regular exercise Get regular exercise. This is one of the most important things you can do for your health. Most adults should: Try to exercise for at least 150 minutes each week. The exercise should increase your heart rate and make you sweat (moderate-intensity exercise). Try to do strengthening exercises at least twice each week. Do these in addition to the moderate-intensity exercise. Spend less time sitting. Even light physical activity can be beneficial. Other tips Work with your health care provider to achieve or maintain a healthy weight. Do not use any products that contain nicotine or tobacco. These products include cigarettes, chewing tobacco, and vaping devices, such as e-cigarettes. If you need help quitting, ask your health care provider. Know your numbers. Ask your health care provider to check your cholesterol and your blood sugar (glucose). Continue to have your blood tested as directed by your health care provider. Do I need screening for cancer? Depending on your health history and family history, you may need to have cancer screenings at different stages of your life. This may include screening for: Breast cancer. Cervical cancer. Lung cancer. Colorectal cancer. What is my risk for osteoporosis? After menopause, you may be   at increased risk for osteoporosis. Osteoporosis is a condition in which bone destruction happens more quickly than new bone creation. To help prevent osteoporosis or  the bone fractures that can happen because of osteoporosis, you may take the following actions: If you are 19-50 years old, get at least 1,000 mg of calcium and at least 600 international units (IU) of vitamin D per day. If you are older than age 50 but younger than age 70, get at least 1,200 mg of calcium and at least 600 international units (IU) of vitamin D per day. If you are older than age 70, get at least 1,200 mg of calcium and at least 800 international units (IU) of vitamin D per day. Smoking and drinking excessive alcohol increase the risk of osteoporosis. Eat foods that are rich in calcium and vitamin D, and do weight-bearing exercises several times each week as directed by your health care provider. How does menopause affect my mental health? Depression may occur at any age, but it is more common as you become older. Common symptoms of depression include: Feeling depressed. Changes in sleep patterns. Changes in appetite or eating patterns. Feeling an overall lack of motivation or enjoyment of activities that you previously enjoyed. Frequent crying spells. Talk with your health care provider if you think that you are experiencing any of these symptoms. General instructions See your health care provider for regular wellness exams and vaccines. This may include: Scheduling regular health, dental, and eye exams. Getting and maintaining your vaccines. These include: Influenza vaccine. Get this vaccine each year before the flu season begins. Pneumonia vaccine. Shingles vaccine. Tetanus, diphtheria, and pertussis (Tdap) booster vaccine. Your health care provider may also recommend other immunizations. Tell your health care provider if you have ever been abused or do not feel safe at home. Summary Menopause is a normal process in which your ability to get pregnant comes to an end. This condition causes hot flashes, night sweats, decreased interest in sex, mood swings, headaches, or lack  of sleep. Treatment for this condition may include hormone replacement therapy. Take actions to keep yourself healthy, including exercising regularly, eating a healthy diet, watching your weight, and checking your blood pressure and blood sugar levels. Get screened for cancer and depression. Make sure that you are up to date with all your vaccines. This information is not intended to replace advice given to you by your health care provider. Make sure you discuss any questions you have with your health care provider. Document Revised: 10/22/2020 Document Reviewed: 10/22/2020 Elsevier Patient Education  2023 [employer]  

## 2022-10-07 NOTE — Assessment & Plan Note (Signed)
Encouraged diet and exercise for weight loss ?

## 2022-10-08 ENCOUNTER — Encounter: Payer: Self-pay | Admitting: Internal Medicine

## 2022-10-08 DIAGNOSIS — E781 Pure hyperglyceridemia: Secondary | ICD-10-CM

## 2022-10-08 LAB — LIPID PANEL
Cholesterol: 214 mg/dL — ABNORMAL HIGH (ref <200)
HDL: 61 mg/dL (ref >=50)
LDL Cholesterol (Calc): 128 mg/dL (calc) — ABNORMAL HIGH
Non-HDL Cholesterol (Calc): 153 mg/dL (calc) — ABNORMAL HIGH (ref <130)
Total CHOL/HDL Ratio: 3.5 (calc) (ref <5.0)
Triglycerides: 135 mg/dL (ref <150)

## 2022-10-08 LAB — COMPLETE METABOLIC PANEL WITH GFR
AG Ratio: 1.5 (calc) (ref 1.0–2.5)
ALT: 16 U/L (ref 6–29)
AST: 18 U/L (ref 10–35)
Albumin: 4.3 g/dL (ref 3.6–5.1)
Alkaline phosphatase (APISO): 82 U/L (ref 37–153)
BUN: 12 mg/dL (ref 7–25)
CO2: 25 mmol/L (ref 20–32)
Calcium: 9.5 mg/dL (ref 8.6–10.4)
Chloride: 107 mmol/L (ref 98–110)
Creat: 0.61 mg/dL (ref 0.50–1.03)
Globulin: 2.8 g/dL (calc) (ref 1.9–3.7)
Glucose, Bld: 104 mg/dL (ref 65–139)
Potassium: 4.1 mmol/L (ref 3.5–5.3)
Sodium: 141 mmol/L (ref 135–146)
Total Bilirubin: 0.4 mg/dL (ref 0.2–1.2)
Total Protein: 7.1 g/dL (ref 6.1–8.1)
eGFR: 105 mL/min/{1.73_m2} (ref >=60)

## 2022-10-08 LAB — HEMOGLOBIN A1C
Hgb A1c MFr Bld: 6.2 % of total Hgb — ABNORMAL HIGH (ref <5.7)
Mean Plasma Glucose: 131 mg/dL
eAG (mmol/L): 7.3 mmol/L

## 2022-10-08 LAB — CBC
HCT: 37.3 % (ref 35.0–45.0)
Hemoglobin: 12.4 g/dL (ref 11.7–15.5)
MCH: 30.7 pg (ref 27.0–33.0)
MCHC: 33.2 g/dL (ref 32.0–36.0)
MCV: 92.3 fL (ref 80.0–100.0)
MPV: 11.6 fL (ref 7.5–12.5)
Platelets: 220 10*3/uL (ref 140–400)
RBC: 4.04 10*6/uL (ref 3.80–5.10)
RDW: 13.1 % (ref 11.0–15.0)
WBC: 5.1 10*3/uL (ref 3.8–10.8)

## 2022-10-08 MED ORDER — ROSUVASTATIN CALCIUM 5 MG PO TABS: 5.0000 mg | ORAL_TABLET | ORAL | 1 refills | Status: DC

## 2022-10-08 MED ORDER — ESZOPICLONE 2 MG PO TABS: 2.0000 mg | ORAL_TABLET | Freq: Every evening | ORAL | 0 refills | Status: DC | PRN

## 2022-10-08 NOTE — Progress Notes (Signed)
CMP WNL

## 2022-10-11 ENCOUNTER — Other Ambulatory Visit: Payer: Self-pay | Admitting: Internal Medicine

## 2022-10-11 DIAGNOSIS — M19041 Primary osteoarthritis, right hand: Secondary | ICD-10-CM

## 2022-10-13 NOTE — Telephone Encounter (Signed)
Requested Prescriptions  Pending Prescriptions Disp Refills   famotidine (PEPCID) 20 MG tablet [Pharmacy Med Name: FAMOTIDINE 20 MG TABLET] 180 tablet 0    Sig: TAKE 1 TABLET (20 MG TOTAL) BY MOUTH 2 (TWO) TIMES DAILY AS NEEDED FOR HEARTBURN OR INDIGESTION.     Gastroenterology:  H2 Antagonists Passed - 10/11/2022  8:31 AM      Passed - Valid encounter within last 12 months    Recent Outpatient Visits           6 days ago Encounter for general adult medical examination with abnormal findings   [place] [facility] [place], Salvadore Oxford, NP   2 months ago DDD (degenerative disc disease), lumbar   [place] [facility] [place], Salvadore Oxford, NP   8 months ago Community acquired pneumonia of left lower lobe of lung   [place] [facility] [place], Salvadore Oxford, NP   11 months ago Fibromyalgia   [place] [facility] [place], Salvadore Oxford, NP   1 year ago Myalgia   [place] [facility] [place], Salvadore Oxford, NP       Future Appointments             In 3 weeks [facility]   In 5 months [place], Salvadore Oxford, NP [place] [facility], [facility]

## 2022-10-20 NOTE — Progress Notes (Unsigned)
Office Visit Note  Patient: [name]             Date of Birth: [birth year]           MRN: [id number]             PCP: Lorre Munroe, NP Referring: Lorre Munroe, NP Visit Date: 11/03/2022 Occupation: @GUAROCC @  Subjective:  Pain in both hands   History of Present Illness: [name] is a 57 y.o. female with history of sjogren's syndrome and osteoarthritis.  Patient has been off of methotrexate for the past 6 weeks.  Patient states that she has had an aversion to use reinitiating methotrexate due to knowing that it is a chemotherapy agent.  She is apprehensive of possible side effects with methotrexate.  She would like to further discuss initiating methotrexate today.  Patient states that she is having severe pain and stiffness in both hands.  She has been having nocturnal discomfort causing interrupted sleep at night.  She has been having take meloxicam 15 mg 1 tablet daily as needed for pain relief.  She requested a prednisone taper today to help alleviate the discomfort in her hands. Patient states that with her Cymbalta as she has been taking 30 mg every other day due to excessive sweating flushing.  She would like to discuss adjusting the dose of Cymbalta but states overall Cymbalta has been helpful at managing her pain and mood. She states that she has noticed intermittent wheezing.  She denies a cough or shortness of breath.    Activities of Daily Living:  Patient reports morning stiffness for 1-1.5 hours.   Patient Reports nocturnal pain.  Difficulty dressing/grooming: Reports Difficulty climbing stairs: Reports Difficulty getting out of chair: Reports Difficulty using hands for taps, buttons, cutlery, and/or writing: Reports  Review of Systems  Constitutional:  Positive for fatigue.  HENT:  Positive for mouth dryness. Negative for mouth sores.   Eyes:  Negative for dryness.  Respiratory:  Positive for wheezing.   Cardiovascular:  Negative for chest  pain and palpitations.  Gastrointestinal:  Positive for constipation, diarrhea and nausea. Negative for blood in stool.  Endocrine: Negative for increased urination.  Genitourinary:  Positive for involuntary urination.  Musculoskeletal:  Positive for joint pain, gait problem, joint pain, joint swelling, myalgias, muscle weakness, morning stiffness, muscle tenderness and myalgias.  Skin:  Positive for hair loss. Negative for color change, rash and sensitivity to sunlight.  Allergic/Immunologic: Negative for susceptible to infections.  Neurological:  Positive for headaches. Negative for dizziness.  Hematological:  Negative for swollen glands.  Psychiatric/Behavioral:  Positive for sleep disturbance. Negative for depressed mood. The patient is nervous/anxious.     PMFS History:  Patient Active Problem List   Diagnosis Date Noted   Primary osteoarthritis of both hands 10/31/2021   Fibromyalgia 10/24/2021   Sjogren's syndrome (HCC) 10/24/2021   DDD (degenerative disc disease), lumbar 10/14/2021   Insomnia 02/14/2021   Prediabetes 12/31/2020   HLD (hyperlipidemia) 12/31/2020   Frequent headaches 12/31/2020   Class 3 severe obesity due to excess calories with body mass index (BMI) of 40.0 to 44.9 in adult (HCC) 12/31/2020   Gastroesophageal reflux disease without esophagitis 11/15/2018   Anxiety and depression 08/03/2014    Past Medical History:  Diagnosis Date   Allergy    Anemia    h/o   Anxiety    DDD (degenerative disc disease), lumbar    Depression    Fibromyalgia    GERD (gastroesophageal  reflux disease)    Morbid obesity (HCC)    Pneumonia 01/2022   PONV (postoperative nausea and vomiting)    Pre-diabetes    Sjogren's syndrome (HCC)     Family History  Problem Relation Age of Onset   Hyperlipidemia Mother    Rheum arthritis Sister    Osteoarthritis Sister    Fibroids Sister    Fibroids Sister    Fibroids Sister    Heart disease Maternal Aunt    Stroke Maternal Aunt     Diabetes Maternal Aunt    Heart disease Maternal Uncle    Stroke Maternal Uncle    Rheum arthritis Nephew    Cancer Neg Hx    Past Surgical History:  Procedure Laterality Date   ABDOMINAL HYSTERECTOMY  2008   LAVH, endometriosis ablation, mccall culdoplasty   CHOLECYSTECTOMY N/A 08/31/2019   Procedure: LAPAROSCOPIC CHOLECYSTECTOMY;  Surgeon: Henrene Dodge, MD;  Location: [facility] ORS;  Service: General;  Laterality: N/A;   DIAGNOSTIC LAPAROSCOPY  2002   Hysteroscopy, diagnostic laparoscopy, extensive lysis of adhesions, endometrial ablation, RSO   RIGHT OOPHORECTOMY  2007   with right salpingectomy   TOTAL KNEE ARTHROPLASTY 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   Procedure: Partial KNEE ARTHROPLASTY;  Surgeon: Lyndle Herrlich, MD;  Location: [facility] ORS;  Service: Orthopedics;  Laterality: Left;   TUBAL LIGATION     Social History   Social History Narrative   Not on file   Immunization History  Administered Date(s) Administered   Influenza,inj,Quad PF,6+ Mos 03/17/2015, 03/17/2017, 02/25/2019, 03/19/2020, 03/28/2021   PFIZER(Purple Top)SARS-COV-2 Vaccination 07/11/2019, 08/01/2019   Tdap 02/09/2007, 09/18/2016     Objective: Vital Signs: BP (!) 121/90 (BP Location: Left Arm, Patient Position: Sitting, Cuff Size: Large)   Pulse 97   Resp 17   Ht 5\' 5"  (1.651 m)   Wt 272 lb 6.4 oz (123.6 kg)   BMI 45.33 kg/m    Physical Exam Vitals and nursing note reviewed.  Constitutional:      Appearance: She is well-developed.  HENT:     Head: Normocephalic and atraumatic.  Eyes:     Conjunctiva/sclera: Conjunctivae normal.  Cardiovascular:     Rate and Rhythm: Normal rate and regular rhythm.     Heart sounds: Normal heart sounds.  Pulmonary:     Effort: Pulmonary effort is normal.     Breath sounds: Normal breath sounds.  Abdominal:     General: Bowel sounds are normal.     Palpations: Abdomen is soft.  Musculoskeletal:     Cervical back: Normal range of motion.  Lymphadenopathy:     Cervical:  No cervical adenopathy.  Skin:    General: Skin is warm and dry.     Capillary Refill: Capillary refill takes less than 2 seconds.  Neurological:     Mental Status: She is alert and oriented to person, place, and time.  Psychiatric:        Behavior: Behavior normal.      Musculoskeletal Exam: C-spine, thoracic spine, and lumbar spine good ROM.  Shoulder joints, elbow joints, wrist joints, MCPs, PIPs, and DIPs good ROM with no discomfort.  Tenderness over the right second and third MCP and PIP joints.  Painful fist formation bilaterally.  Tenderness and mild warmth in the left wrist.  Hip joints have good ROM.  Limited ROM of left knee replacement. Warmth of left knee replacement.  Right knee has good ROM.  Tenderness of both ankles.    CDAI Exam: CDAI Score: -- Patient Global: --; Provider Global: --  Swollen: --; Tender: -- Joint Exam 11/03/2022   No joint exam has been documented for this visit   There is currently no information documented on the homunculus. Go to the Rheumatology activity and complete the homunculus joint exam.  Investigation: No additional findings.  Imaging: No results found.  Recent Labs: Lab Results  Component Value Date   WBC 5.1 10/07/2022   HGB 12.4 10/07/2022   PLT 220 10/07/2022   NA 141 10/07/2022   K 4.1 10/07/2022   CL 107 10/07/2022   CO2 25 10/07/2022   GLUCOSE 104 10/07/2022   BUN 12 10/07/2022   CREATININE 0.61 10/07/2022   BILITOT 0.4 10/07/2022   ALKPHOS 62 03/19/2020   AST 18 10/07/2022   ALT 16 10/07/2022   PROT 7.1 10/07/2022   ALBUMIN 3.9 03/19/2020   CALCIUM 9.5 10/07/2022   GFRAA >60 12/01/2019   QFTBGOLDPLUS NEGATIVE 04/17/2022    Speciality Comments: No specialty comments available.  Procedures:  No procedures performed Allergies: Gabapentin, Hydrochlorothiazide, Lipitor [atorvastatin], Penicillins, and Orange juice [orange oil]      Assessment / Plan:     Visit Diagnoses: Sjogren's syndrome with  keratoconjunctivitis sicca (HCC) - Positive ANA, positive SSA, sicca symptoms, arthralgias: She has ongoing sicca symptoms and arthralgias.  Patient has been off of methotrexate for the past 6 weeks due to the concern for possible side effects.  She is apprehensive to take methotrexate since it is considered a chemotherapy agent.  Reviewed the indications, contraindications, and potential side effects again today in detail.  Discussed that the dose of methotrexate she is taking is not indicated for the treatment of cancer but is recommended for management of chronic inflammatory arthritis.  Patient is willing to restart on methotrexate.  She plans on restarting methotrexate 6 tablets by mouth once weekly x 2 weeks and if labs are stable she will increase to 8 tablets once weekly.  She will resume taking folic acid 2 mg daily.  Offered a referral for second opinion to a rheumatologist at [facility] but she has declined at this time. Patient has been noticing increased wheezing and some shortness of breath intermittently.  Her lungs were clear to auscultation today.  Plan on placing referral to [facility] to rule out ILD. Again discussed the risks of developing ILD, lymphoma, and neuropathy in patients with Sjogren's syndrome. To treat her current flare a prednisone taper starting 20 mg tapering by 5 mg every 4 days with sent to the pharmacy.  Instructions were provided including avoiding the use of NSAIDs including meloxicam. She was advised to notify us if she develops signs or symptoms of a flare.  She will follow-up in the office in 2 to 3 months or sooner if needed.- Plan: Ambulatory referral to Pulmonology  Inflammatory arthritis - Anti-CCP and rheumatoid factor negative on 10/14/2021.  Ultrasound of both hands was positive for synovitis on 02/12/2022.  Patient presents today with severe pain and stiffness in both hands.  She has been experiencing nocturnal pain and morning stiffness staying lasting 1  to 2 hours.  She has tenderness in the right second and third MCP and PIP joints.  She has has tenderness and warmth in the left wrist.  A prednisone taper starting at 20 mg tapering by 5 mg every 4 days with sent to the pharmacy today.  She plans on resuming methotrexate as discussed above.  She will discontinue meloxicam once starting on prednisone and methotrexate.  High risk medication use - Methotrexate 8 tablets  by mouth once weekly and folic acid 2 mg daily.  CBC and CMP updated on 10/07/22. Her next lab work will be due in July and every 3 months. Standing orders for CBC and CMP remain in place.  Discussed the importance of holding methotrexate if she develops signs or symptoms of an infection and to resume once the infection has completely cleared.   - Plan: CBC with Differential/Platelet, COMPLETE METABOLIC PANEL WITH GFR  Primary osteoarthritis of both hands: Patient continues to have chronic pain and stiffness in both hands especially during the night and first thing in the mornings.  At times she has difficulty wearing arthritis compression gloves.  She takes meloxicam 15 mg 1 tablet by mouth daily.  Patient requested a prednisone taper to be sent to the pharmacy today.  She was advised to avoid the use of NSAIDs while taking prednisone.  Discussed the importance of joint protection and muscle strengthening.  She plans on reinitiating methotrexate as discussed above.  Paresthesia of both hands - Bilateral median nerves are within normal limits.  Trochanteric bursitis of both hips: Ongoing pain in both hips consistent with trochanter bursitis.  Interrupted sleep at night.  Status post left knee replacement - Partial-06/03/22 Performed by Dr. Odis Luster.  Limited range of motion and warmth noted.  She is using a cane less frequently.  Primary osteoarthritis of right knee: No warmth or effusion noted on examination today.  DDD (degenerative disc disease), lumbar: Chronic pain.  Limited  mobility.  Fibromyalgia: She has ongoing generalized myalgias and muscle tenderness consistent with fibromyalgia.  She has been having increased myofascial pain recently.  She has been having to take meloxicam 50 mg 1 tablet daily for pain relief.  Anxiety and depression: Patient has been taking Cymbalta 30 mg 1 capsule by mouth every other day.  Since spacing the dose of Cymbalta she has had less flushing and excessive sweating.  Plan on reducing Cymbalta to 20 mg 1 capsule daily.  She will notify us if she cannot tolerate taking Cymbalta.  She continues to find Cymbalta to be clinically beneficial in regards to managing her pain level as well as her mood.  Other medical conditions are listed as follows:  Adjustment insomnia  Frequent headaches  Pure hypertriglyceridemia  Hypermobility of joint  Prediabetes  Gastroesophageal reflux disease without esophagitis  Seasonal allergies  Family history of rheumatoid arthritis- two sisters and nephew  Osteoporosis screening - DEXA updated on 07/29/22: BMD LFN 0.978 g/cm2 with a T-score of -0.4. repeat DEXA in 2 years due to history of prednisone use and recent right foot fracture.  Orders: Orders Placed This Encounter  Procedures   CBC with Differential/Platelet   COMPLETE METABOLIC PANEL WITH GFR   Ambulatory referral to Pulmonology   Meds ordered this encounter  Medications   DULoxetine (CYMBALTA) 20 MG capsule    Sig: Take 1 capsule (20 mg total) by mouth daily.    Dispense:  30 capsule    Refill:  0   predniSONE (DELTASONE) 5 MG tablet    Sig: Take 4 tablets by mouth daily x4 days, 3 tablets daily x4 days, 2 tablets daily x4 days, 1 tablet daily x4 days.    Dispense:  40 tablet    Refill:  0     Follow-Up Instructions: Return in about 3 months (around 02/03/2023) for Sjogren's syndrome.   Gearldine Bienenstock, PA-C  Note - This record has been created using Dragon software.  Chart creation errors have been sought,  but may not  always  have been located. Such creation errors do not reflect on  the standard of medical care.

## 2022-10-24 ENCOUNTER — Encounter: Payer: Self-pay | Admitting: Internal Medicine

## 2022-11-03 ENCOUNTER — Encounter: Payer: Self-pay | Admitting: Physician Assistant

## 2022-11-03 ENCOUNTER — Ambulatory Visit: Payer: Commercial Managed Care - HMO | Attending: Physician Assistant | Admitting: Physician Assistant

## 2022-11-03 VITALS — BP 121/90 | HR 97 | Resp 17 | Ht 65.0 in | Wt 272.4 lb

## 2022-11-03 DIAGNOSIS — K219 Gastro-esophageal reflux disease without esophagitis: Secondary | ICD-10-CM

## 2022-11-03 DIAGNOSIS — R202 Paresthesia of skin: Secondary | ICD-10-CM

## 2022-11-03 DIAGNOSIS — R7303 Prediabetes: Secondary | ICD-10-CM

## 2022-11-03 DIAGNOSIS — Z79899 Other long term (current) drug therapy: Secondary | ICD-10-CM

## 2022-11-03 DIAGNOSIS — F419 Anxiety disorder, unspecified: Secondary | ICD-10-CM

## 2022-11-03 DIAGNOSIS — M1711 Unilateral primary osteoarthritis, right knee: Secondary | ICD-10-CM

## 2022-11-03 DIAGNOSIS — Z96652 Presence of left artificial knee joint: Secondary | ICD-10-CM

## 2022-11-03 DIAGNOSIS — Z8261 Family history of arthritis: Secondary | ICD-10-CM

## 2022-11-03 DIAGNOSIS — M5136 Other intervertebral disc degeneration, lumbar region: Secondary | ICD-10-CM

## 2022-11-03 DIAGNOSIS — M19041 Primary osteoarthritis, right hand: Secondary | ICD-10-CM

## 2022-11-03 DIAGNOSIS — F5102 Adjustment insomnia: Secondary | ICD-10-CM

## 2022-11-03 DIAGNOSIS — M249 Joint derangement, unspecified: Secondary | ICD-10-CM

## 2022-11-03 DIAGNOSIS — M1990 Unspecified osteoarthritis, unspecified site: Secondary | ICD-10-CM

## 2022-11-03 DIAGNOSIS — R519 Headache, unspecified: Secondary | ICD-10-CM

## 2022-11-03 DIAGNOSIS — M797 Fibromyalgia: Secondary | ICD-10-CM

## 2022-11-03 DIAGNOSIS — M19042 Primary osteoarthritis, left hand: Secondary | ICD-10-CM

## 2022-11-03 DIAGNOSIS — M3501 Sicca syndrome with keratoconjunctivitis: Secondary | ICD-10-CM

## 2022-11-03 DIAGNOSIS — M7061 Trochanteric bursitis, right hip: Secondary | ICD-10-CM

## 2022-11-03 DIAGNOSIS — J302 Other seasonal allergic rhinitis: Secondary | ICD-10-CM

## 2022-11-03 DIAGNOSIS — M7062 Trochanteric bursitis, left hip: Secondary | ICD-10-CM

## 2022-11-03 DIAGNOSIS — M51369 Other intervertebral disc degeneration, lumbar region without mention of lumbar back pain or lower extremity pain: Secondary | ICD-10-CM

## 2022-11-03 DIAGNOSIS — E781 Pure hyperglyceridemia: Secondary | ICD-10-CM

## 2022-11-03 DIAGNOSIS — Z13820 Encounter for screening for osteoporosis: Secondary | ICD-10-CM

## 2022-11-03 DIAGNOSIS — F32A Depression, unspecified: Secondary | ICD-10-CM

## 2022-11-03 MED ORDER — PREDNISONE 5 MG PO TABS: ORAL_TABLET | ORAL | 0 refills | Status: DC

## 2022-11-03 MED ORDER — DULOXETINE HCL 20 MG PO CPEP: 20.0000 mg | ORAL_CAPSULE | Freq: Every day | ORAL | 0 refills | Status: DC

## 2022-11-07 ENCOUNTER — Telehealth: Payer: Self-pay | Admitting: Internal Medicine

## 2022-11-07 ENCOUNTER — Encounter: Payer: Self-pay | Admitting: Internal Medicine

## 2022-11-07 NOTE — Telephone Encounter (Signed)
Pt is calling in requesting for Rene Kocher to send a new prescription for an inhaler to [facility] [place]. Pt says she no longer has one and it is needed. Please advise.

## 2022-11-07 NOTE — Telephone Encounter (Signed)
See telephone encounter   Thanks,   -Malajah Oceguera  

## 2022-11-11 NOTE — Telephone Encounter (Signed)
I do not see any problem on her problem list that indicates why she needs an inhaler.  There is no history of asthma or COPD.

## 2022-11-12 ENCOUNTER — Other Ambulatory Visit: Payer: Self-pay | Admitting: Internal Medicine

## 2022-11-12 MED ORDER — ALBUTEROL SULFATE HFA 108 (90 BASE) MCG/ACT IN AERS: 2.0000 | INHALATION_SPRAY | Freq: Four times a day (QID) | RESPIRATORY_TRACT | 0 refills | Status: DC | PRN

## 2022-11-12 NOTE — Telephone Encounter (Signed)
Pt states she has had increased wheezing.  Her rheumatologist has referred her to pulmonology but her appointment is not until July.  She would like an inhaler to help with symptoms until she can see the specialist.    Thanks,   -Vernona Rieger

## 2022-11-25 ENCOUNTER — Other Ambulatory Visit: Payer: Self-pay | Admitting: Physician Assistant

## 2022-11-25 NOTE — Telephone Encounter (Signed)
Last Fill: 11/03/2022 (30 day supply)  Next Visit: 02/03/2023  Last Visit: 11/03/2022  Dx: Anxiety and depression   Current Dose per office note on 11/03/2022: Cymbalta to 20 mg 1 capsule daily.   Okay to refill Cymbalta?

## 2022-12-03 ENCOUNTER — Encounter: Payer: Self-pay | Admitting: Internal Medicine

## 2022-12-16 ENCOUNTER — Other Ambulatory Visit: Payer: Self-pay | Admitting: Internal Medicine

## 2022-12-16 NOTE — Telephone Encounter (Signed)
Requested Prescriptions  Pending Prescriptions Disp Refills   albuterol (VENTOLIN HFA) 108 (90 Base) MCG/ACT inhaler [Pharmacy Med Name: ALBUTEROL HFA (PROAIR) INHALER] 8.5 each 2    Sig: TAKE 2 PUFFS BY MOUTH EVERY 6 HOURS AS NEEDED FOR WHEEZE OR SHORTNESS OF BREATH     Pulmonology:  Beta Agonists 2 Failed - 12/16/2022  2:32 AM      Failed - Last BP in normal range    BP Readings from Last 1 Encounters:  11/03/22 (!) 121/90         Passed - Last Heart Rate in normal range    Pulse Readings from Last 1 Encounters:  11/03/22 97         Passed - Valid encounter within last 12 months    Recent Outpatient Visits           2 months ago Encounter for general adult medical examination with abnormal findings   [place] [facility] [place], Salvadore Oxford, NP   4 months ago DDD (degenerative disc disease), lumbar   [place] [facility] [place], Salvadore Oxford, NP   11 months ago Community acquired pneumonia of left lower lobe of lung   [place] [facility] [place], Salvadore Oxford, NP   1 year ago Fibromyalgia   [place] [facility], Salvadore Oxford, NP   1 year ago Myalgia   [place] [facility] [place], Salvadore Oxford, NP       Future Appointments             In 1 month [place] Lenice Llamas [facility]   In 3 months [place], Salvadore Oxford, NP [place] [facility], [facility]

## 2022-12-24 ENCOUNTER — Ambulatory Visit: Payer: Commercial Managed Care - HMO | Admitting: Pulmonary Disease

## 2022-12-24 ENCOUNTER — Encounter: Payer: Self-pay | Admitting: Pulmonary Disease

## 2022-12-24 VITALS — BP 130/66 | HR 111 | Temp 98.5°F | Ht 65.0 in | Wt 274.8 lb

## 2022-12-24 DIAGNOSIS — J849 Interstitial pulmonary disease, unspecified: Secondary | ICD-10-CM

## 2022-12-24 MED ORDER — BREZTRI AEROSPHERE 160-9-4.8 MCG/ACT IN AERO: 2.0000 | INHALATION_SPRAY | Freq: Two times a day (BID) | RESPIRATORY_TRACT | 0 refills | Status: DC

## 2022-12-24 MED ORDER — BREZTRI AEROSPHERE 160-9-4.8 MCG/ACT IN AERO: 2.0000 | INHALATION_SPRAY | Freq: Two times a day (BID) | RESPIRATORY_TRACT | 5 refills | Status: DC

## 2022-12-24 NOTE — Addendum Note (Signed)
Addended by: Jacquiline Doe on: 12/24/2022 02:13 PM   Modules accepted: Orders

## 2022-12-24 NOTE — Patient Instructions (Signed)
Will give you a sample of Breztri to see if it helps with your breathing and send a prescription Schedule high-res CT at next available.  Schedule PFTs in 4 months and return to clinic in 4 months

## 2022-12-24 NOTE — Progress Notes (Signed)
[name]    [id number]    Dec 22, 1965  Primary Care Physician:Baity, Salvadore Oxford, NP  Referring Physician: Lorre Munroe, NP [address] [place],  [place] [address]  Chief complaint: Consult for interstitial lung disease  HPI: 57 y.o. who  has a past medical history of Allergy, Anemia, Anxiety, DDD (degenerative disc disease), lumbar, Depression, Fibromyalgia, GERD (gastroesophageal reflux disease), Morbid obesity (HCC), Pneumonia (01/2022), PONV (postoperative nausea and vomiting), Pre-diabetes, and Sjogren's syndrome (HCC).   She has history of Sjogren's syndrome, osteoarthritis.  Sjogren's syndrome was diagnosed in July 2023.  She was on methotrexate for a few months and then stopped in December 2023-is going to a lot of other medical issues at that time Referred to pulmonary for evaluation of dyspnea, wheezing and concern for interstitial lung disease.  She states she has episodes of seeing with cough, clear mucus that has been going on for several months.  Symptoms occur 3-4 times per week.  Pets: Dogs Occupation: Used to work in [employer] office.  Currently on disability Exposures: She had a mold exposure at work in 2022.  There are no ongoing exposures.  No hot tubs, Jacuzzis, feather pillows ILD questionnaire 12/24/2022-negative Smoking history: Never smoker Travel history: Originally from [place].  No significant travel history Relevant family history: No family history of lung disease   Outpatient Encounter Medications as of 12/24/2022  Medication Sig   acetaminophen (TYLENOL 8 HOUR ARTHRITIS PAIN) 650 MG CR tablet Take 1,300 mg by mouth daily.   albuterol (VENTOLIN HFA) 108 (90 Base) MCG/ACT inhaler TAKE 2 PUFFS BY MOUTH EVERY 6 HOURS AS NEEDED FOR WHEEZE OR SHORTNESS OF BREATH   ALLEGRA-D ALLERGY & CONGESTION 180-240 MG 24 hr tablet TAKE 1 TABLET BY MOUTH EVERY DAY AS NEEDED (Patient taking differently: 1 tablet as needed.)   diclofenac Sodium  (VOLTAREN) 1 % GEL APPLY 2 GRAMS TO AFFECTED AREA 4 TIMES A DAY (Patient taking differently: 2 g as needed.)   DULoxetine (CYMBALTA) 20 MG capsule TAKE 1 CAPSULE BY MOUTH EVERY DAY   eszopiclone (LUNESTA) 2 MG TABS tablet Take 1 tablet (2 mg total) by mouth at bedtime as needed for sleep. Take immediately before bedtime   famotidine (PEPCID) 20 MG tablet TAKE 1 TABLET (20 MG TOTAL) BY MOUTH 2 (TWO) TIMES DAILY AS NEEDED FOR HEARTBURN OR INDIGESTION.   pantoprazole (PROTONIX) 40 MG tablet Take 1 tablet (40 mg total) by mouth 2 (two) times daily.   cyclobenzaprine (FLEXERIL) 10 MG tablet Take 10 mg by mouth at bedtime as needed. (Patient not taking: Reported on 12/24/2022)   hydrOXYzine (VISTARIL) 25 MG capsule Take 1 capsule (25 mg total) by mouth daily as needed. (Patient not taking: Reported on 12/24/2022)   meloxicam (MOBIC) 15 MG tablet Take 15 mg by mouth daily. (Patient not taking: Reported on 12/24/2022)   rosuvastatin (CRESTOR) 5 MG tablet Take 1 tablet (5 mg total) by mouth 3 (three) times a week. (Patient not taking: Reported on 11/03/2022)   VITAMIN D PO Take 1 tablet by mouth once a week. (Patient not taking: Reported on 11/03/2022)   [DISCONTINUED] predniSONE (DELTASONE) 5 MG tablet Take 4 tablets by mouth daily x4 days, 3 tablets daily x4 days, 2 tablets daily x4 days, 1 tablet daily x4 days.   No facility-administered encounter medications on file as of 12/24/2022.    Allergies as of 12/24/2022 - Review Complete 12/24/2022  Allergen Reaction Noted   Gabapentin  Other (See Comments) 09/28/2014   Hydrochlorothiazide Other (See Comments) 03/28/2021   Lipitor [atorvastatin]  02/12/2022   Penicillins Other (See Comments) 02/27/2012   Orange juice [orange oil] Rash 04/06/2020    Past Medical History:  Diagnosis Date   Allergy    Anemia    h/o   Anxiety    DDD (degenerative disc disease), lumbar    Depression    Fibromyalgia    GERD (gastroesophageal reflux disease)    Morbid  obesity (HCC)    Pneumonia 01/2022   PONV (postoperative nausea and vomiting)    Pre-diabetes    Sjogren's syndrome (HCC)     Past Surgical History:  Procedure Laterality Date   ABDOMINAL HYSTERECTOMY  2008   LAVH, endometriosis ablation, mccall culdoplasty   CHOLECYSTECTOMY N/A 08/31/2019   Procedure: LAPAROSCOPIC CHOLECYSTECTOMY;  Surgeon: Henrene Dodge, MD;  Location: [facility];  Service: General;  Laterality: N/A;   DIAGNOSTIC LAPAROSCOPY  2002   Hysteroscopy, diagnostic laparoscopy, extensive lysis of adhesions, endometrial ablation, RSO   RIGHT OOPHORECTOMY  2007   with right salpingectomy   TOTAL KNEE ARTHROPLASTY 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   Procedure: Partial KNEE ARTHROPLASTY;  Surgeon: Lyndle Herrlich, MD;  Location: [facility];  Service: Orthopedics;  Laterality: Left;   TUBAL LIGATION      Family History  Problem Relation Age of Onset   Hyperlipidemia Mother    Rheum arthritis Sister    Osteoarthritis Sister    Fibroids Sister    Fibroids Sister    Fibroids Sister    Heart disease Maternal Aunt    Stroke Maternal Aunt    Diabetes Maternal Aunt    Heart disease Maternal Uncle    Stroke Maternal Uncle    Rheum arthritis Nephew    Cancer Neg Hx     Social History   Socioeconomic History   Marital status: Married    Spouse name: Not on file   Number of children: Not on file   Years of education: Not on file   Highest education level: Not on file  Occupational History   Not on file  Tobacco Use   Smoking status: Never    Passive exposure: Current   Smokeless tobacco: Never  Vaping Use   Vaping Use: Never used  Substance and Sexual Activity   Alcohol use: Yes    Comment: occ   Drug use: Not Currently    Types: Marijuana    Comment: has tried   Sexual activity: Yes    Partners: Male    Birth control/protection: Post-menopausal, Surgical  Other Topics Concern   Not on file  Social History Narrative   Not on file   Social Determinants of Health   Financial  Resource Strain: Not on file  Food Insecurity: No Food Insecurity (06/03/2022)   Hunger Vital Sign    Worried About Running Out of Food in the Last Year: Never true    Ran Out of Food in the Last Year: Never true  Transportation Needs: No Transportation Needs (06/03/2022)   PRAPARE - Administrator, Civil Service (Medical): No    Lack of Transportation (Non-Medical): No  Physical Activity: Not on file  Stress: Not on file  Social Connections: Not on file  Intimate Partner Violence: Not At Risk (06/03/2022)   Humiliation, Afraid, Rape, and Kick questionnaire    Fear of Current or Ex-Partner: No    Emotionally Abused: No    Physically Abused: No    Sexually Abused: No  Review of systems: Review of Systems  Constitutional: Negative for fever and chills.  HENT: Negative.   Eyes: Negative for blurred vision.  Respiratory: as per HPI  Cardiovascular: Negative for chest pain and palpitations.  Gastrointestinal: Negative for vomiting, diarrhea, blood per rectum. Genitourinary: Negative for dysuria, urgency, frequency and hematuria.  Musculoskeletal: Negative for myalgias, back pain and joint pain.  Skin: Negative for itching and rash.  Neurological: Negative for dizziness, tremors, focal weakness, seizures and loss of consciousness.  Endo/Heme/Allergies: Negative for environmental allergies.  Psychiatric/Behavioral: Negative for depression, suicidal ideas and hallucinations.  All other systems reviewed and are negative.  Physical Exam: Blood pressure 130/66, pulse (!) 111, temperature 98.5 F (36.9 C), temperature source Oral, height 5\' 5"  (1.651 m), weight 274 lb 12.8 oz (124.6 kg), SpO2 99 %. Gen:      No acute distress HEENT:  EOMI, sclera anicteric Neck:     No masses; no thyromegaly Lungs:    Clear to auscultation bilaterally; normal respiratory effort CV:         Regular rate and rhythm; no murmurs Abd:      + bowel sounds; soft, non-tender; no palpable masses,  no distension Ext:    No edema; adequate peripheral perfusion Skin:      Warm and dry; no rash Neuro: alert and oriented x 3 Psych: normal mood and affect  Data Reviewed: Imaging: Chest x-ray 02/27/2022-no active cardiopulmonary disease I had reviewed the images personally.  PFTs:  Labs: CBC 08/06/2022-WBC 4.3, eos 0.9%, absolute eosinophil count 39   Assessment:  Assessment for dyspnea, wheezing May have reactive airway disease. She has a diagnosis of Sjogren's disease and will also need evaluation for ILD  I will give her a sample of Breztri inhaler and a prescription. Schedule PFTs Schedule high-res CT for evaluation of the lungs  Plan/Recommendations: Breztri PFTs CT  Chilton Greathouse MD [facility] 12/24/2022, 1:46 PM  CC: Lorre Munroe, NP

## 2022-12-30 ENCOUNTER — Ambulatory Visit
Admission: RE | Admit: 2022-12-30 | Discharge: 2022-12-30 | Disposition: A | Discharge status: Home / Self Care | Payer: Commercial Managed Care - HMO | Source: Ambulatory Visit | Attending: Pulmonary Disease | Admitting: Pulmonary Disease

## 2022-12-30 ENCOUNTER — Ambulatory Visit: Payer: Commercial Managed Care - HMO

## 2022-12-30 DIAGNOSIS — J849 Interstitial pulmonary disease, unspecified: Secondary | ICD-10-CM

## 2023-01-06 ENCOUNTER — Telehealth: Payer: Self-pay | Admitting: Pulmonary Disease

## 2023-01-06 NOTE — Telephone Encounter (Signed)
Pt called in stating that she is calling in for her results.

## 2023-01-09 ENCOUNTER — Telehealth: Payer: Self-pay | Admitting: Pulmonary Disease

## 2023-01-09 NOTE — Telephone Encounter (Signed)
PT calling for CT results. Her Disability Case manager also needed results and has put in a request to no avail.  Please call PT at [phone] to advise of results and if we need to have a records release form for case manager to get results.

## 2023-01-12 ENCOUNTER — Encounter: Payer: Self-pay | Admitting: Pulmonary Disease

## 2023-01-12 NOTE — Telephone Encounter (Signed)
ATC patient.  No answer and mailbox is currently full.

## 2023-01-13 ENCOUNTER — Telehealth (HOSPITAL_BASED_OUTPATIENT_CLINIC_OR_DEPARTMENT_OTHER): Payer: Self-pay

## 2023-01-13 NOTE — Telephone Encounter (Signed)
I called and discussed with patient.  Reviewed scans with finding of very minimal changes.  Will review PFTs when available and order a follow-up CT scan at time of return visit..  Nothing further needed.

## 2023-01-13 NOTE — Telephone Encounter (Signed)
Pt. Calling to get CT results and she needs this reviewed for her disability case please advise needs Dr. Isaiah Serge to review CT

## 2023-01-14 NOTE — Telephone Encounter (Signed)
Dr. Mannam please advise on CT results. 

## 2023-01-14 NOTE — Telephone Encounter (Signed)
I already called and discussed with the patient results of CT showing very minimal changes that we will continue to monitor with PFTs and follow-up CT.  Nothing further needed.

## 2023-01-20 NOTE — Progress Notes (Unsigned)
Office Visit Note  Patient: [name]             Date of Birth: 1965/07/18           MRN: [id number]             PCP: Lorre Munroe, NP Referring: Lorre Munroe, NP Visit Date: 02/03/2023 Occupation: @GUAROCC @  Subjective:  Pain involving multiple joints  History of Present Illness: [name] is a 57 y.o. female with history of sjogren's syndrome and inflammatory arthritis.  Patient continues to have persistent pain and stiffness involving multiple joints.  Her severity of pain has been interfering with her quality of life.  Her symptoms have been most severe in the right shoulder, both hands, and the right side of her lower back.  She is currently having to take Extra strength tylenol 650 mg BID, gabapentin, robaxin, meloxicam 15 mg daily for pain relief.  She has been experiencing intermittent swelling in both hands.  Patient states that after her last office visit she took methotrexate for a few weeks but discontinued after being restarted on meloxicam.  She has been apprehensive to restart methotrexate well remaining on meloxicam.  Patient presents today to discuss other treatment options to manage her pain and inflammation.   Activities of Daily Living:  Patient reports morning stiffness for 1-1.5 hours.   Patient Reports nocturnal pain.  Difficulty dressing/grooming: Reports Difficulty climbing stairs: Reports Difficulty getting out of chair: Reports Difficulty using hands for taps, buttons, cutlery, and/or writing: Reports  Review of Systems  Constitutional:  Positive for fatigue.  HENT:  Positive for mouth dryness. Negative for mouth sores.   Eyes:  Positive for dryness.  Respiratory:  Positive for wheezing. Negative for shortness of breath.   Cardiovascular:  Positive for palpitations. Negative for chest pain.  Gastrointestinal:  Negative for blood in stool, constipation and diarrhea.  Endocrine: Negative for increased urination.  Genitourinary:   Negative for involuntary urination.  Musculoskeletal:  Positive for joint pain, gait problem, joint pain, joint swelling, myalgias, muscle weakness, morning stiffness, muscle tenderness and myalgias.  Skin:  Negative for color change, rash, hair loss and sensitivity to sunlight.  Allergic/Immunologic: Negative for susceptible to infections.  Neurological:  Positive for headaches. Negative for dizziness.  Hematological:  Negative for swollen glands.  Psychiatric/Behavioral:  Positive for sleep disturbance. Negative for depressed mood. The patient is not nervous/anxious.     PMFS History:  Patient Active Problem List   Diagnosis Date Noted   Primary osteoarthritis of both hands 10/31/2021   Fibromyalgia 10/24/2021   Sjogren's syndrome (HCC) 10/24/2021   DDD (degenerative disc disease), lumbar 10/14/2021   Insomnia 02/14/2021   Prediabetes 12/31/2020   HLD (hyperlipidemia) 12/31/2020   Frequent headaches 12/31/2020   Class 3 severe obesity due to excess calories with body mass index (BMI) of 40.0 to 44.9 in adult (HCC) 12/31/2020   Gastroesophageal reflux disease without esophagitis 11/15/2018   Anxiety and depression 08/03/2014    Past Medical History:  Diagnosis Date   Allergy    Anemia    h/o   Anxiety    DDD (degenerative disc disease), lumbar    Depression    Fibromyalgia    GERD (gastroesophageal reflux disease)    Morbid obesity (HCC)    Pneumonia 01/2022   PONV (postoperative nausea and vomiting)    Pre-diabetes    Sjogren's syndrome (HCC)     Family History  Problem Relation Age of Onset   Hyperlipidemia  Mother    Rheum arthritis Sister    Osteoarthritis Sister    Fibroids Sister    Fibroids Sister    Fibroids Sister    Heart disease Maternal Aunt    Stroke Maternal Aunt    Diabetes Maternal Aunt    Heart disease Maternal Uncle    Stroke Maternal Uncle    Rheum arthritis Nephew    Cancer Neg Hx    Past Surgical History:  Procedure Laterality Date    ABDOMINAL HYSTERECTOMY  2008   LAVH, endometriosis ablation, mccall culdoplasty   CHOLECYSTECTOMY N/A 08/31/2019   Procedure: LAPAROSCOPIC CHOLECYSTECTOMY;  Surgeon: Henrene Dodge, MD;  Location: [facility];  Service: General;  Laterality: N/A;   DIAGNOSTIC LAPAROSCOPY  2002   Hysteroscopy, diagnostic laparoscopy, extensive lysis of adhesions, endometrial ablation, RSO   RIGHT OOPHORECTOMY  2007   with right salpingectomy   TOTAL KNEE ARTHROPLASTY 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   Procedure: Partial KNEE ARTHROPLASTY;  Surgeon: Lyndle Herrlich, MD;  Location: [facility];  Service: Orthopedics;  Laterality: Left;   TUBAL LIGATION     Social History   Social History Narrative   Not on file   Immunization History  Administered Date(s) Administered   Influenza,inj,Quad PF,6+ Mos 03/17/2015, 03/17/2017, 02/25/2019, 03/19/2020, 03/28/2021   PFIZER(Purple Top)SARS-COV-2 Vaccination 07/11/2019, 08/01/2019   Tdap 02/09/2007, 09/18/2016     Objective: Vital Signs: BP (!) 142/84 (BP Location: Left Arm, Patient Position: Sitting, Cuff Size: Large)   Pulse 78   Resp 17   Ht 5\' 5"  (1.651 m)   Wt 274 lb 9.6 oz (124.6 kg)   BMI 45.70 kg/m    Physical Exam Vitals and nursing note reviewed.  Constitutional:      Appearance: She is well-developed.  HENT:     Head: Normocephalic and atraumatic.  Eyes:     Conjunctiva/sclera: Conjunctivae normal.  Cardiovascular:     Rate and Rhythm: Normal rate and regular rhythm.     Heart sounds: Normal heart sounds.  Pulmonary:     Effort: Pulmonary effort is normal.     Breath sounds: Normal breath sounds.  Abdominal:     General: Bowel sounds are normal.     Palpations: Abdomen is soft.  Musculoskeletal:     Cervical back: Normal range of motion.  Lymphadenopathy:     Cervical: No cervical adenopathy.  Skin:    General: Skin is warm and dry.     Capillary Refill: Capillary refill takes less than 2 seconds.  Neurological:     Mental Status: She is alert and  oriented to person, place, and time.  Psychiatric:        Behavior: Behavior normal.      Musculoskeletal Exam: Painful range of motion of the C-spine and lumbar spine.  Limited mobility of the lumbar spine.  Painful range of motion of the right shoulder.  Tenderness over both wrist joints.  Tenderness over several MCP and PIP joints.  Hip joints have good range of motion.  Painful range of motion of the right knee.  Painful range of motion of the left knee replacement with warmth.  Tenderness over both ankle joints.  CDAI Exam: CDAI Score: -- Patient Global: --; Provider Global: -- Swollen: 0 ; Tender: 9  Joint Exam 02/03/2023      Right  Left  Glenohumeral   Tender     Wrist   Tender   Tender  MCP 2   Tender     MCP 3   Tender  PIP 2 (finger)   Tender     PIP 3 (finger)   Tender     PIP 4 (finger)   Tender     Knee   Tender        Investigation: No additional findings.  Imaging: No results found.  Recent Labs: Lab Results  Component Value Date   WBC 5.1 10/07/2022   HGB 12.4 10/07/2022   PLT 220 10/07/2022   NA 141 10/07/2022   K 4.1 10/07/2022   CL 107 10/07/2022   CO2 25 10/07/2022   GLUCOSE 104 10/07/2022   BUN 12 10/07/2022   CREATININE 0.61 10/07/2022   BILITOT 0.4 10/07/2022   ALKPHOS 62 03/19/2020   AST 18 10/07/2022   ALT 16 10/07/2022   PROT 7.1 10/07/2022   ALBUMIN 3.9 03/19/2020   CALCIUM 9.5 10/07/2022   GFRAA >60 12/01/2019   QFTBGOLDPLUS NEGATIVE 04/17/2022    Speciality Comments: No specialty comments available.  Procedures:  No procedures performed Allergies: Hydrochlorothiazide, Lipitor [atorvastatin], Penicillins, and Orange juice [orange oil]    Assessment / Plan:     Visit Diagnoses: Sjogren's syndrome with keratoconjunctivitis sicca (HCC) - Positive ANA, positive SSA, sicca symptoms, arthralgias: Patient continues to have chronic sicca symptoms as well as pain and inflammation involving multiple joints.  Patient was last seen  in the office on 11/03/2022 at which time the plan was for her to reinitiate methotrexate.  The patient took methotrexate for several weeks but discontinued due to being restarted on meloxicam for lower back pain and right-sided sciatica.  According to the patient she has been having to take meloxicam 15 mg daily, exercising Tylenol 650 mg twice daily, gabapentin, and methocarbamol in order to be mobile throughout the day.  She is currently awaiting to schedule an MRI of the lumbar spine. She has been experiencing ongoing pain and inflammation in both hands and is concerned about progression of inflammatory arthritis since she has not been able to take methotrexate.  Discussed the concern for hepatic and renal toxicity if she resumes methotrexate along with meloxicam and extra strength Tylenol. Patient had a high-resolution chest CT on 12/30/2022 ordered by Dr. Isaiah Serge which revealed very subtle findings indicative of early or mild interstitial lung disease.  Patient is scheduled to have PFTs performed in November 2024. Different treatment options were discussed today in detail.  Discussed that with the concern for early ILD as well as due to the concern for hepatorenal toxicity she may not be a good candidate for methotrexate.  Reviewed indications, contraindications, potential side effects of Imuran today in detail.  Plan to update TPMT along with CBC and CMP today.  Patient is open to try Imuran 50 mg 1 tablet daily pending lab results.  She will notify us if she cannot tolerate taking Imuran.  She will require close lab monitoring.  Plan to obtain the following lab work today for further evaluation.  She will follow-up in the office in 6 to 8 weeks to assess her response. - Plan: COMPLETE METABOLIC PANEL WITH GFR, CBC with Differential/Platelet, Thiopurine methyltransferase(tpmt)rbc, Serum protein electrophoresis with reflex, Sjogrens syndrome-A extractable nuclear antibody, Sjogrens syndrome-B extractable  nuclear antibody, Rheumatoid factor, ANA, Urinalysis, Routine w reflex microscopic, Sedimentation rate, C3 and C4  Baseline Immunosuppressant Therapy Labs TB GOLD    Latest Ref Rng & Units 04/17/2022    2:47 PM  Quantiferon TB Gold  Quantiferon TB Gold Plus NEGATIVE NEGATIVE    Hepatitis Panel    Latest Ref Rng &  Units 04/17/2022    2:47 PM  Hepatitis  Hep B Surface Ag NON-REACTIVE NON-REACTIVE   Hep B IgM NON-REACTIVE NON-REACTIVE    HIV Lab Results  Component Value Date   HIV NON-REACTIVE 04/17/2022   HIV NON REACTIVE 08/30/2019   Immunoglobulins    Latest Ref Rng & Units 04/17/2022    2:47 PM  Immunoglobulin Electrophoresis  IgA  47 - 310 mg/dL 161   IgG 096 - 0,454 mg/dL 0,981   IgM 50 - 191 mg/dL 59    SPEP    Latest Ref Rng & Units 10/07/2022    2:02 PM  Serum Protein Electrophoresis  Total Protein 6.1 - 8.1 g/dL 7.1     TPMT: Pending  Chest Xray: No active cardiopulmonary disease on 02/27/2023. High-resolution chest CT ordered on 12/30/2022 very subtle ILD changes  Patient was counseled on the purpose, proper use, and adverse effects of azathioprine including risk of infection, nausea, rash, and hair loss.  Also informed that medication can cause discoloration of urine, sweat and tears. Reviewed risk of cancer after long term use.  Discussed risk of myelosupression and reviewed importance of frequent lab work to monitor blood counts.  Standing orders placed. Reviewed drug-drug interactions including contraindication with allopurinol.  Provided patient with educational materials on azathioprine and answered all questions.  Patient consented to azathioprine.  Will upload consent into the media tab.   Patient dose will be 50 mg 1 tablet by mouth daily.  Prescription will be sent to pharmacy pending lab results and insurance approval.  Inflammatory arthritis - Anti-CCP and rheumatoid factor negative on 10/14/2021.  Ultrasound of both hands was positive for synovitis on  02/12/2022. Family history of rheumatoid arthritis: Patient presents today with ongoing pain and inflammation involving both hands.  She has had difficulty using her right hands at times due to the severity of symptoms. Plan to check ESR today.   Plan to initiate Imuran pending lab results as discussed above.- Plan: Sedimentation rate  High risk medication use -Plan to initiate Imuran 50 mg 1 tablet by mouth daily pending lab results-CBC/CMP/TPMT.  Plan to recheck CBC and CMP in 2 weeks, 2 months, and then every 3 months. Standing orders for CBC and CMP remain in place.  CBC and CMP updated on 10/07/22.  Orders for CBC and CMP released today.  Discussed the importance of holding Imuran if she develops signs or symptoms of an infection and to resume once the infection has completely cleared.  - Plan: COMPLETE METABOLIC PANEL WITH GFR, CBC with Differential/Platelet, Thiopurine methyltransferase(tpmt)rbc  Primary osteoarthritis of both hands: Clinical and radiographic findings are consistent with osteoarthritis.  Patient continues to have chronic pain in both hands.  Paresthesia of both hands: Not currently experiencing paresthesias.  Trochanteric bursitis of both hips: Chronic pain  Status post left knee replacement - Partial-06/03/22 Performed by Dr. Odis Luster.  Chronic pain.  Using a cane to assist with ambulation.  Primary osteoarthritis of right knee: Chronic pain.  Using cane to assist with ambulation.  Take tylenol 650 mg BID and meloxicam 15 mg daily for pain relief.    DDD (degenerative disc disease), lumbar: Chronic pain.  Currently experiencing right sided sciatica.  Approved for MRI-waiting to schedule imaging.    Fibromyalgia: Generalized hyperalgesia and positive tender points.    Osteoporosis screening: DEXA updated on 07/29/22: The BMD measured at [facility] Ne[facility] Left is 0.978 g/cm2 with a T-score of -0.4. repeat DEXA in 2 years due to history of prednisone use  and recent right foot  fracture.   Other medical conditions are listed as follows:   Anxiety and depression   Adjustment insomnia  Frequent headaches  Pure hypertriglyceridemia  Hypermobility of joint  Prediabetes  Gastroesophageal reflux disease without esophagitis  Seasonal allergies  Family history of rheumatoid arthritis- two sisters and nephew  Orders: Orders Placed This Encounter  Procedures   COMPLETE METABOLIC PANEL WITH GFR   CBC with Differential/Platelet   Thiopurine methyltransferase(tpmt)rbc   Serum protein electrophoresis with reflex   Sjogrens syndrome-A extractable nuclear antibody   Sjogrens syndrome-B extractable nuclear antibody   Rheumatoid factor   ANA   Urinalysis, Routine w reflex microscopic   Sedimentation rate   C3 and C4   No orders of the defined types were placed in this encounter.    Follow-Up Instructions: Return in 6 weeks (on 03/17/2023) for Sjogren's syndrome.   Gearldine Bienenstock, PA-C  Note - This record has been created using Dragon software.  Chart creation errors have been sought, but may not always  have been located. Such creation errors do not reflect on  the standard of medical care.

## 2023-02-03 ENCOUNTER — Ambulatory Visit: Payer: 59 | Attending: Physician Assistant | Admitting: Physician Assistant

## 2023-02-03 ENCOUNTER — Encounter: Payer: Self-pay | Admitting: Physician Assistant

## 2023-02-03 ENCOUNTER — Other Ambulatory Visit: Payer: Self-pay

## 2023-02-03 VITALS — BP 142/84 | HR 78 | Resp 17 | Ht 65.0 in | Wt 274.6 lb

## 2023-02-03 DIAGNOSIS — Z96652 Presence of left artificial knee joint: Secondary | ICD-10-CM

## 2023-02-03 DIAGNOSIS — M19041 Primary osteoarthritis, right hand: Secondary | ICD-10-CM | POA: Diagnosis not present

## 2023-02-03 DIAGNOSIS — M1990 Unspecified osteoarthritis, unspecified site: Secondary | ICD-10-CM | POA: Diagnosis not present

## 2023-02-03 DIAGNOSIS — M7062 Trochanteric bursitis, left hip: Secondary | ICD-10-CM

## 2023-02-03 DIAGNOSIS — M51369 Other intervertebral disc degeneration, lumbar region without mention of lumbar back pain or lower extremity pain: Secondary | ICD-10-CM

## 2023-02-03 DIAGNOSIS — Z79899 Other long term (current) drug therapy: Secondary | ICD-10-CM

## 2023-02-03 DIAGNOSIS — E781 Pure hyperglyceridemia: Secondary | ICD-10-CM

## 2023-02-03 DIAGNOSIS — M7061 Trochanteric bursitis, right hip: Secondary | ICD-10-CM

## 2023-02-03 DIAGNOSIS — M249 Joint derangement, unspecified: Secondary | ICD-10-CM

## 2023-02-03 DIAGNOSIS — M1711 Unilateral primary osteoarthritis, right knee: Secondary | ICD-10-CM

## 2023-02-03 DIAGNOSIS — M3501 Sicca syndrome with keratoconjunctivitis: Secondary | ICD-10-CM

## 2023-02-03 DIAGNOSIS — J302 Other seasonal allergic rhinitis: Secondary | ICD-10-CM

## 2023-02-03 DIAGNOSIS — M5136 Other intervertebral disc degeneration, lumbar region: Secondary | ICD-10-CM

## 2023-02-03 DIAGNOSIS — R7303 Prediabetes: Secondary | ICD-10-CM

## 2023-02-03 DIAGNOSIS — Z8261 Family history of arthritis: Secondary | ICD-10-CM

## 2023-02-03 DIAGNOSIS — R519 Headache, unspecified: Secondary | ICD-10-CM

## 2023-02-03 DIAGNOSIS — F419 Anxiety disorder, unspecified: Secondary | ICD-10-CM

## 2023-02-03 DIAGNOSIS — M1380 Other specified arthritis, unspecified site: Secondary | ICD-10-CM

## 2023-02-03 DIAGNOSIS — F5102 Adjustment insomnia: Secondary | ICD-10-CM

## 2023-02-03 DIAGNOSIS — R202 Paresthesia of skin: Secondary | ICD-10-CM

## 2023-02-03 DIAGNOSIS — F32A Depression, unspecified: Secondary | ICD-10-CM

## 2023-02-03 DIAGNOSIS — M19042 Primary osteoarthritis, left hand: Secondary | ICD-10-CM

## 2023-02-03 DIAGNOSIS — K219 Gastro-esophageal reflux disease without esophagitis: Secondary | ICD-10-CM

## 2023-02-03 DIAGNOSIS — M797 Fibromyalgia: Secondary | ICD-10-CM

## 2023-02-03 NOTE — Patient Instructions (Signed)
Standing Labs We placed an order today for your standing lab work.   Please have your standing labs drawn in 2 weeks, 2 months, then every 3 months   Please have your labs drawn 2 weeks prior to your appointment so that the provider can discuss your lab results at your appointment, if possible.  Please note that you may see your imaging and lab results in MyChart before we have reviewed them. We will contact you once all results are reviewed. Please allow our office up to 72 hours to thoroughly review all of the results before contacting the office for clarification of your results.  WALK-IN LAB HOURS  Monday through Thursday from 8:00 am -12:30 pm and 1:00 pm-5:00 pm and Friday from 8:00 am-12:00 pm.  Patients with office visits requiring labs will be seen before walk-in labs.  You may encounter longer than normal wait times. Please allow additional time. Wait times may be shorter on  Monday and Thursday afternoons.  We do not book appointments for walk-in labs. We appreciate your patience and understanding with our staff.   Labs are drawn by [facility]. Please bring your co-pay at the time of your lab draw.  You may receive a bill from [facility] for your lab work.  Please note if you are on Hydroxychloroquine and and an order has been placed for a Hydroxychloroquine level,  you will need to have it drawn 4 hours or more after your last dose.  If you wish to have your labs drawn at another location, please call the office 24 hours in advance so we can fax the orders.  The office is located at [address], [place], [place] [address]   If you have any questions regarding directions or hours of operation,  please call [phone].   As a reminder, please drink plenty of water prior to coming for your lab work. Thanks!   Azathioprine Tablets What is this medication? AZATHIOPRINE (ay za THYE oh preen) prevents the body from rejecting an organ transplant. It works by lowering the  body's immune system response. This helps the body accept the donor organ. It may also be used to treat rheumatoid arthritis. This medicine may be used for other purposes; ask your health care provider or pharmacist if you have questions. COMMON BRAND NAME(S): Azasan, Imuran What should I tell my care team before I take this medication? They need to know if you have any of these conditions: Infection Kidney disease Liver disease An unusual or allergic reaction to azathioprine, lactose, other medications, foods, dyes, or preservatives Pregnant or trying to get pregnant Breastfeeding How should I use this medication? Take this medication by mouth with a full glass of water. Take it as directed on the prescription label at the same time every day. Keep taking it unless your care team tells you to stop. Keep taking it even if you think you are better. Talk to your care team about the use of this medication in children. Special care may be needed. Overdosage: If you think you have taken too much of this medicine contact a poison control center or emergency room at once. NOTE: This medicine is only for you. Do not share this medicine with others. What if I miss a dose? If you miss a dose, take it as soon as you can. If it is almost time for your next dose, take only that dose. Do not take double or extra doses. What may interact with this medication? Do not  take this medication with any of the following: Febuxostat Mercaptopurine This medication may also interact with the following: Allopurinol Aminosalicylates, such as sulfasalazine, mesalamine, balsalazide, and olsalazine Leflunomide Medications called ACE inhibitors, such as benazepril, captopril, enalapril, fosinopril, quinapril, lisinopril, ramipril, and trandolapril Mycophenolate Sulfamethoxazole; trimethoprim Vaccines Warfarin This list may not describe all possible interactions. Give your health care provider a list of all the  medicines, herbs, non-prescription drugs, or dietary supplements you use. Also tell them if you smoke, drink alcohol, or use illegal drugs. Some items may interact with your medicine. What should I watch for while using this medication? Visit your care team for regular checks on your progress. You may need blood work done while you are taking this medication. This medication may increase your risk of getting an infection. Call your care team for advice if you get a fever, chills, sore throat, or other symptoms of a cold or flu. Do not treat yourself. Try to avoid being around people who are sick. Talk to your care team about your risk of cancer. You may be more at risk for certain types of cancer if you take this medication. Talk to your care team if you may be pregnant. This medication can cause serious birth defects if taken during pregnancy. This medication may cause infertility. Talk to your care team if you are concerned about your fertility. What side effects may I notice from receiving this medication? Side effects that you should report to your care team as soon as possible: Allergic reactions--skin rash, itching, hives, swelling of the face, lips, tongue, or throat Change in your skin, such as a new growth, a sore that doesn't heal, or a change in a mole Dizziness, loss of balance or coordination, confusion or trouble speaking Infection--fever, chills, cough, sore throat, wounds that don't heal, pain or trouble when passing urine, general feeling of discomfort or being unwell Low red blood cell level--unusual weakness or fatigue, dizziness, headache, trouble breathing Unusual bruising or bleeding Side effects that usually do not require medical attention (report to your care team if they continue or are bothersome): Diarrhea Fatigue Nausea Vomiting This list may not describe all possible side effects. Call your doctor for medical advice about side effects. You may report side effects to  FDA at 1-800-FDA-1088. Where should I keep my medication? Keep out of the reach of children and pets. Store at room temperature between 15 and 25 degrees C (59 and 77 degrees F). Protect from light. Get rid of any unused medication after the expiration date. To get rid of medications that are no longer needed or have expired: Take the medication to a medication take-back program. Check with your pharmacy or law enforcement to find a location. If you cannot return the medication, check the label or package insert to see if the medication should be thrown out in the garbage or flushed down the toilet. If you are not sure, ask your care team. If it is safe to put it in the trash, empty the medication out of the container. Mix the medication with cat litter, dirt, coffee grounds, or other unwanted substance. Seal the mixture in a bag or container. Put it in the trash. NOTE: This sheet is a summary. It may not cover all possible information. If you have questions about this medicine, talk to your doctor, pharmacist, or health care provider.  2024 Elsevier/Gold Standard (2021-11-05 00:00:00)

## 2023-02-03 NOTE — Telephone Encounter (Signed)
Pending lab results, patient will be starting Imuran per Sherron Ales, PA-C. Please send to [facility] in [place]. Thanks!   Consent obtained and sent to the scan center.

## 2023-02-06 NOTE — Progress Notes (Signed)
CBC and CMP WNL  UA normal Ro antibody remains positive.  La antibody negative.   ESR WNL.  RF negative. SPEP did not reveal any abnormal protein bands.  Complements WNL

## 2023-02-11 ENCOUNTER — Other Ambulatory Visit: Payer: Self-pay | Admitting: Internal Medicine

## 2023-02-12 LAB — COMPLETE METABOLIC PANEL WITH GFR
AG Ratio: 1.4 (calc) (ref 1.0–2.5)
ALT: 17 U/L (ref 6–29)
AST: 13 U/L (ref 10–35)
Albumin: 4.4 g/dL (ref 3.6–5.1)
Alkaline phosphatase (APISO): 81 U/L (ref 37–153)
BUN: 14 mg/dL (ref 7–25)
CO2: 23 mmol/L (ref 20–32)
Calcium: 9.8 mg/dL (ref 8.6–10.4)
Chloride: 109 mmol/L (ref 98–110)
Creat: 0.68 mg/dL (ref 0.50–1.03)
Globulin: 3.1 g/dL (ref 1.9–3.7)
Glucose, Bld: 96 mg/dL (ref 65–99)
Potassium: 3.9 mmol/L (ref 3.5–5.3)
Sodium: 141 mmol/L (ref 135–146)
Total Bilirubin: 0.4 mg/dL (ref 0.2–1.2)
Total Protein: 7.5 g/dL (ref 6.1–8.1)
eGFR: 102 mL/min/{1.73_m2} (ref >=60)

## 2023-02-12 LAB — CBC WITH DIFFERENTIAL/PLATELET
Absolute Monocytes: 373 {cells}/uL (ref 200–950)
Basophils Absolute: 32 {cells}/uL (ref 0–200)
Basophils Relative: 0.6 %
Eosinophils Absolute: 70 {cells}/uL (ref 15–500)
Eosinophils Relative: 1.3 %
HCT: 37.8 % (ref 35.0–45.0)
Hemoglobin: 12.4 g/dL (ref 11.7–15.5)
Lymphs Abs: 1723 {cells}/uL (ref 850–3900)
MCH: 30 pg (ref 27.0–33.0)
MCHC: 32.8 g/dL (ref 32.0–36.0)
MCV: 91.5 fL (ref 80.0–100.0)
MPV: 11.3 fL (ref 7.5–12.5)
Monocytes Relative: 6.9 %
Neutro Abs: 3202 {cells}/uL (ref 1500–7800)
Neutrophils Relative %: 59.3 %
Platelets: 236 10*3/uL (ref 140–400)
RBC: 4.13 10*6/uL (ref 3.80–5.10)
RDW: 13.7 % (ref 11.0–15.0)
Total Lymphocyte: 31.9 %
WBC: 5.4 10*3/uL (ref 3.8–10.8)

## 2023-02-12 LAB — URINALYSIS, ROUTINE W REFLEX MICROSCOPIC
Bilirubin Urine: NEGATIVE
Glucose, UA: NEGATIVE
Hgb urine dipstick: NEGATIVE
Ketones, ur: NEGATIVE
Leukocytes,Ua: NEGATIVE
Nitrite: NEGATIVE
Protein, ur: NEGATIVE
Specific Gravity, Urine: 1.033 (ref 1.001–1.035)
pH: 6 (ref 5.0–8.0)

## 2023-02-12 LAB — SJOGRENS SYNDROME-B EXTRACTABLE NUCLEAR ANTIBODY: SSB (La) (ENA) Antibody, IgG: <1 AI

## 2023-02-12 LAB — PROTEIN ELECTROPHORESIS, SERUM, WITH REFLEX
Albumin ELP: 4.2 g/dL (ref 3.8–4.8)
Alpha 1: 0.3 g/dL (ref 0.2–0.3)
Alpha 2: 0.7 g/dL (ref 0.5–0.9)
Beta 2: 0.5 g/dL (ref 0.2–0.5)
Beta Globulin: 0.5 g/dL (ref 0.4–0.6)
Gamma Globulin: 1.2 g/dL (ref 0.8–1.7)
Total Protein: 7.4 g/dL (ref 6.1–8.1)

## 2023-02-12 LAB — C3 AND C4
C3 Complement: 170 mg/dL (ref 83–193)
C4 Complement: 51 mg/dL (ref 15–57)

## 2023-02-12 LAB — SJOGRENS SYNDROME-A EXTRACTABLE NUCLEAR ANTIBODY: SSA (Ro) (ENA) Antibody, IgG: 4.4 AI — AB

## 2023-02-12 LAB — RHEUMATOID FACTOR: Rheumatoid fact SerPl-aCnc: <10 [IU]/mL (ref <14)

## 2023-02-12 LAB — ANA: Anti Nuclear Antibody (ANA): POSITIVE — AB

## 2023-02-12 LAB — ANTI-NUCLEAR AB-TITER (ANA TITER)
ANA TITER: 1:160 {titer} — ABNORMAL HIGH
ANA Titer 1: 1:80 {titer} — ABNORMAL HIGH

## 2023-02-12 LAB — SEDIMENTATION RATE: Sed Rate: 22 mm/h (ref 0–30)

## 2023-02-12 LAB — THIOPURINE METHYLTRANSFERASE (TPMT), RBC: Thiopurine Methyltransferase, RBC: 15 nmol/h/mL

## 2023-02-12 MED ORDER — ESZOPICLONE 2 MG PO TABS: 2.0000 mg | ORAL_TABLET | Freq: Every evening | ORAL | 0 refills | Status: DC | PRN

## 2023-02-13 MED ORDER — AZATHIOPRINE 50 MG PO TABS: 50.0000 mg | ORAL_TABLET | Freq: Every day | ORAL | 2 refills | Status: DC

## 2023-02-13 NOTE — Progress Notes (Signed)
Ro antibody and ANA remain positive. La negative.  TMPT WNL.  Ok to initiate imuran 50 mg 1 tablet by mouth daily.  Recheck lab work in 2 weeks.

## 2023-02-13 NOTE — Telephone Encounter (Signed)
Ro antibody and ANA remain positive. La negative. TMPT WNL.  Ok to initiate imuran 50 mg 1 tablet by mouth daily.

## 2023-02-15 ENCOUNTER — Other Ambulatory Visit: Payer: Self-pay | Admitting: Internal Medicine

## 2023-02-15 DIAGNOSIS — K219 Gastro-esophageal reflux disease without esophagitis: Secondary | ICD-10-CM

## 2023-02-18 NOTE — Telephone Encounter (Signed)
Requested medication (s) are due for refill today: Yes  Requested medication (s) are on the active medication list: Yes  Last refill:  10/07/22  Future visit scheduled: Yes  Notes to clinic:  Pharmacy request an alternative      Requested Prescriptions  Pending Prescriptions Disp Refills   lansoprazole (PREVACID) 30 MG capsule [Pharmacy Med Name: LANSOPRAZOLE DR 30 MG CAPSULE]  0     Gastroenterology: Proton Pump Inhibitors 2 Passed - 02/15/2023 11:42 AM      Passed - ALT in normal range and within 360 days    ALT  Date Value Ref Range Status  02/03/2023 17 6 - 29 U/L Final         Passed - AST in normal range and within 360 days    AST  Date Value Ref Range Status  02/03/2023 13 10 - 35 U/L Final         Passed - Valid encounter within last 12 months    Recent Outpatient Visits           4 months ago Encounter for general adult medical examination with abnormal findings   [place] [facility], Salvadore Oxford, NP   7 months ago DDD (degenerative disc disease), lumbar   [place] [facility] [place], Salvadore Oxford, NP   1 year ago Community acquired pneumonia of left lower lobe of lung   [place] [facility] [place], Salvadore Oxford, NP   1 year ago Fibromyalgia   [place] [facility] [place], Salvadore Oxford, NP   1 year ago Myalgia   [place] [facility] [place], Salvadore Oxford, NP       Future Appointments             In 1 month [place], Salvadore Oxford, NP [place] [facility]

## 2023-03-08 ENCOUNTER — Other Ambulatory Visit: Payer: Self-pay | Admitting: Internal Medicine

## 2023-03-08 ENCOUNTER — Other Ambulatory Visit: Payer: Self-pay | Admitting: Physician Assistant

## 2023-03-08 DIAGNOSIS — K219 Gastro-esophageal reflux disease without esophagitis: Secondary | ICD-10-CM

## 2023-03-10 NOTE — Telephone Encounter (Signed)
Requested medication (s) are due for refill today: alternative requested  Requested medication (s) are on the active medication list: yes  Last refill:  02/18/23  Future visit scheduled: yes  Notes to clinic:  Pharmacy comment: Alternative Requested:PRIOR AUTH REQUIED      Requested Prescriptions  Pending Prescriptions Disp Refills   pantoprazole (PROTONIX) 40 MG tablet [Pharmacy Med Name: PANTOPRAZOLE SOD DR 40 MG TAB]  0     Gastroenterology: Proton Pump Inhibitors Passed - 03/08/2023 11:29 AM      Passed - Valid encounter within last 12 months    Recent Outpatient Visits           5 months ago Encounter for general adult medical examination with abnormal findings   [place] [facility] [place], Salvadore Oxford, NP   7 months ago DDD (degenerative disc disease), lumbar   [place] [facility] [place], Salvadore Oxford, NP   1 year ago Community acquired pneumonia of left lower lobe of lung   [place] [facility] [place], Salvadore Oxford, NP   1 year ago Fibromyalgia   [place] [facility] [place], Salvadore Oxford, NP   1 year ago Myalgia   [place] [facility] [place], Salvadore Oxford, NP       Future Appointments             In 1 month [place], Salvadore Oxford, NP [place] [facility], [facility]   In 1 month Chilton Greathouse, MD [facility] at [facility]

## 2023-03-20 ENCOUNTER — Other Ambulatory Visit: Payer: Self-pay | Admitting: Internal Medicine

## 2023-03-20 DIAGNOSIS — K219 Gastro-esophageal reflux disease without esophagitis: Secondary | ICD-10-CM

## 2023-03-20 NOTE — Telephone Encounter (Signed)
Requested medication (s) are due for refill today -no  Requested medication (s) are on the active medication list -no  Future visit scheduled -yes  Last refill: 02/18/23  Notes to clinic: Pharmacy request: Alternative/PA- medication is not listed on current medication list  Requested Prescriptions  Pending Prescriptions Disp Refills   pantoprazole (PROTONIX) 40 MG tablet [Pharmacy Med Name: PANTOPRAZOLE SOD DR 40 MG TAB]  0     Gastroenterology: Proton Pump Inhibitors Passed - 03/20/2023 11:13 AM      Passed - Valid encounter within last 12 months    Recent Outpatient Visits           5 months ago Encounter for general adult medical examination with abnormal findings   [place] [facility] [place], Salvadore Oxford, NP   8 months ago DDD (degenerative disc disease), lumbar   [place] [facility] [place], Salvadore Oxford, NP   1 year ago Community acquired pneumonia of left lower lobe of lung   [place] [facility] [place], Salvadore Oxford, NP   1 year ago Fibromyalgia   [place] [facility] [place], Salvadore Oxford, NP   1 year ago Myalgia   [place] [facility] [place], Salvadore Oxford, NP       Future Appointments             In 3 weeks [place], Salvadore Oxford, NP [place] [facility]   In 1 month Chilton Greathouse, MD [facility] at [facility]               Requested Prescriptions  Pending Prescriptions Disp Refills   pantoprazole (PROTONIX) 40 MG tablet [Pharmacy Med Name: PANTOPRAZOLE SOD DR 40 MG TAB]  0     Gastroenterology: Proton Pump Inhibitors Passed - 03/20/2023 11:13 AM      Passed - Valid encounter within last 12 months    Recent Outpatient Visits           5 months ago Encounter for general adult medical examination with abnormal findings   [place] [facility] [place], Salvadore Oxford, NP   8 months ago DDD (degenerative disc disease), lumbar   [facility] [place], Salvadore Oxford, NP   1 year ago Community acquired pneumonia of left lower lobe of lung   [place] [facility] [place], Salvadore Oxford, NP   1 year ago Fibromyalgia   [place] [facility] [place], Salvadore Oxford, NP   1 year ago Myalgia   [place] [facility] [place], Salvadore Oxford, NP       Future Appointments             In 3 weeks [place], Salvadore Oxford, NP [place] [facility], [facility]   In 1 month Chilton Greathouse, MD [facility]monary Care at [facility]

## 2023-03-23 ENCOUNTER — Other Ambulatory Visit: Payer: Self-pay | Admitting: *Deleted

## 2023-03-23 DIAGNOSIS — Z79899 Other long term (current) drug therapy: Secondary | ICD-10-CM

## 2023-03-24 LAB — CBC WITH DIFFERENTIAL/PLATELET
Absolute Monocytes: 318 {cells}/uL (ref 200–950)
Basophils Absolute: 21 {cells}/uL (ref 0–200)
Basophils Relative: 0.4 %
Eosinophils Absolute: 80 {cells}/uL (ref 15–500)
Eosinophils Relative: 1.5 %
HCT: 43 % (ref 35.0–45.0)
Hemoglobin: 13.9 g/dL (ref 11.7–15.5)
Lymphs Abs: 1357 {cells}/uL (ref 850–3900)
MCH: 31 pg (ref 27.0–33.0)
MCHC: 32.3 g/dL (ref 32.0–36.0)
MCV: 96 fL (ref 80.0–100.0)
MPV: 11 fL (ref 7.5–12.5)
Monocytes Relative: 6 %
Neutro Abs: 3525 {cells}/uL (ref 1500–7800)
Neutrophils Relative %: 66.5 %
Platelets: 230 10*3/uL (ref 140–400)
RBC: 4.48 10*6/uL (ref 3.80–5.10)
RDW: 13.1 % (ref 11.0–15.0)
Total Lymphocyte: 25.6 %
WBC: 5.3 10*3/uL (ref 3.8–10.8)

## 2023-03-24 LAB — COMPLETE METABOLIC PANEL WITH GFR
AG Ratio: 1.2 (calc) (ref 1.0–2.5)
ALT: 17 U/L (ref 6–29)
AST: 16 U/L (ref 10–35)
Albumin: 4.2 g/dL (ref 3.6–5.1)
Alkaline phosphatase (APISO): 87 U/L (ref 37–153)
BUN: 16 mg/dL (ref 7–25)
CO2: 24 mmol/L (ref 20–32)
Calcium: 9.9 mg/dL (ref 8.6–10.4)
Chloride: 107 mmol/L (ref 98–110)
Creat: 0.67 mg/dL (ref 0.50–1.03)
Globulin: 3.5 g/dL (ref 1.9–3.7)
Glucose, Bld: 123 mg/dL — ABNORMAL HIGH (ref 65–99)
Potassium: 4.3 mmol/L (ref 3.5–5.3)
Sodium: 141 mmol/L (ref 135–146)
Total Bilirubin: 0.5 mg/dL (ref 0.2–1.2)
Total Protein: 7.7 g/dL (ref 6.1–8.1)
eGFR: 102 mL/min/{1.73_m2} (ref >=60)

## 2023-03-24 NOTE — Progress Notes (Signed)
Glucose is 123.  Rest of CMP WNL.  CBC WNL.

## 2023-03-27 NOTE — Progress Notes (Signed)
Office Visit Note  Patient: [name]             Date of Birth: 10/01/1965           MRN: [id number]             PCP: Lorre Munroe, NP Referring: Lorre Munroe, NP Visit Date: 03/30/2023 Occupation: @GUAROCC @  Subjective:  Chronic pain   History of Present Illness: [name] is a 57 y.o. female with history of sjogren's syndrome and osteoarthritis.  She is taking imuran 50 mg 1 tablet by mouth daily.  She has been tolerating imuran without any side effects.  She has noticed improvement in her hand pain but continues to have chronic pain in her lower back and left knee replacement.  She has difficulty standing or sitting for prolonged periods of time.  She has had difficulty performing ADLs due to the severity of her symptoms.  She had a back injection performed on 02/17/2023 which has helped with the symptoms of radiculopathy but she continues to have limited mobility and discomfort in her lower back.  She is considering having to proceed with surgery in the future.  She remains on gabapentin as prescribed.  Patient states that she has noticed about a 30% improvement in her hand pain and inflammation since initiating Imuran about 6 weeks ago.  She continues to have difficulty writing or typing for prolonged periods of time.  She also has difficulty with fine motor and grip strength.    Activities of Daily Living:  Patient reports morning stiffness for 20-30 minutes.   Patient Denies nocturnal pain.  Difficulty dressing/grooming: Reports Difficulty climbing stairs: Reports Difficulty getting out of chair: Reports Difficulty using hands for taps, buttons, cutlery, and/or writing: Reports  Review of Systems  Constitutional:  Positive for fatigue.  HENT:  Positive for mouth dryness. Negative for mouth sores.   Eyes:  Positive for dryness.  Respiratory:  Negative for shortness of breath.   Cardiovascular:  Positive for palpitations. Negative for chest pain.   Gastrointestinal:  Negative for blood in stool, constipation and diarrhea.  Endocrine: Negative for increased urination.  Genitourinary:  Positive for nocturia. Negative for painful urination and involuntary urination.  Musculoskeletal:  Positive for joint pain, gait problem, joint pain, myalgias, muscle weakness, morning stiffness, muscle tenderness and myalgias. Negative for joint swelling.  Skin:  Negative for color change, rash, hair loss and sensitivity to sunlight.  Allergic/Immunologic: Negative for susceptible to infections.  Neurological:  Positive for headaches. Negative for dizziness.  Hematological:  Negative for swollen glands.  Psychiatric/Behavioral:  Negative for depressed mood and sleep disturbance. The patient is not nervous/anxious.     PMFS History:  Patient Active Problem List   Diagnosis Date Noted   Primary osteoarthritis of both hands 10/31/2021   Fibromyalgia 10/24/2021   Sjogren's syndrome (HCC) 10/24/2021   DDD (degenerative disc disease), lumbar 10/14/2021   Insomnia 02/14/2021   Prediabetes 12/31/2020   HLD (hyperlipidemia) 12/31/2020   Frequent headaches 12/31/2020   Class 3 severe obesity due to excess calories with body mass index (BMI) of 40.0 to 44.9 in adult (HCC) 12/31/2020   Gastroesophageal reflux disease without esophagitis 11/15/2018   Anxiety and depression 08/03/2014    Past Medical History:  Diagnosis Date   Allergy    Anemia    h/o   Anxiety    DDD (degenerative disc disease), lumbar    Depression    Fibromyalgia    GERD (gastroesophageal reflux  disease)    Morbid obesity (HCC)    Pneumonia 01/2022   PONV (postoperative nausea and vomiting)    Pre-diabetes    Sjogren's syndrome (HCC)     Family History  Problem Relation Age of Onset   Hyperlipidemia Mother    Rheum arthritis Sister    Osteoarthritis Sister    Fibroids Sister    Fibroids Sister    Fibroids Sister    Heart disease Maternal Aunt    Stroke Maternal Aunt     Diabetes Maternal Aunt    Heart disease Maternal Uncle    Stroke Maternal Uncle    Rheum arthritis Nephew    Cancer Neg Hx    Past Surgical History:  Procedure Laterality Date   ABDOMINAL HYSTERECTOMY  2008   LAVH, endometriosis ablation, mccall culdoplasty   CHOLECYSTECTOMY N/A 08/31/2019   Procedure: LAPAROSCOPIC CHOLECYSTECTOMY;  Surgeon: Henrene Dodge, MD;  Location: [facility] ORS;  Service: General;  Laterality: N/A;   DIAGNOSTIC LAPAROSCOPY  2002   Hysteroscopy, diagnostic laparoscopy, extensive lysis of adhesions, endometrial ablation, RSO   RIGHT OOPHORECTOMY  2007   with right salpingectomy   TOTAL KNEE ARTHROPLASTY 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   Procedure: Partial KNEE ARTHROPLASTY;  Surgeon: Lyndle Herrlich, MD;  Location: [facility] ORS;  Service: Orthopedics;  Laterality: Left;   TUBAL LIGATION     Social History   Social History Narrative   Not on file   Immunization History  Administered Date(s) Administered   Influenza,inj,Quad PF,6+ Mos 03/17/2015, 03/17/2017, 02/25/2019, 03/19/2020, 03/28/2021   PFIZER(Purple Top)SARS-COV-2 Vaccination 07/11/2019, 08/01/2019   Tdap 02/09/2007, 09/18/2016     Objective: Vital Signs: BP (!) 150/93 (BP Location: Left Arm, Patient Position: Sitting, Cuff Size: Normal)   Pulse 89   Resp 15   Ht 5\' 5"  (1.651 m)   Wt 270 lb (122.5 kg)   BMI 44.93 kg/m    Physical Exam Vitals and nursing note reviewed.  Constitutional:      Appearance: She is well-developed.  HENT:     Head: Normocephalic and atraumatic.  Eyes:     Conjunctiva/sclera: Conjunctivae normal.  Cardiovascular:     Rate and Rhythm: Normal rate and regular rhythm.     Heart sounds: Normal heart sounds.  Pulmonary:     Effort: Pulmonary effort is normal.     Breath sounds: Normal breath sounds.  Abdominal:     General: Bowel sounds are normal.     Palpations: Abdomen is soft.  Musculoskeletal:     Cervical back: Normal range of motion.  Lymphadenopathy:     Cervical: No cervical  adenopathy.  Skin:    General: Skin is warm and dry.     Capillary Refill: Capillary refill takes less than 2 seconds.  Neurological:     Mental Status: She is alert and oriented to person, place, and time.  Psychiatric:        Behavior: Behavior normal.      Musculoskeletal Exam: C-spine, thoracic spine, lumbar spine have limited and painful range of motion.  Shoulder joints have good range of motion with some stiffness bilaterally.  Elbow joints and wrist joints have good ROM.  Tenderness over several MCP and PIP joints.  Hip joints have good ROM.  Painful ROM of both knees.  Limited mobility of the left knee.  Ankle joints have good ROM with tenderness bilaterally.   CDAI Exam: CDAI Score: -- Patient Global: --; Provider Global: -- Swollen: 0 ; Tender: 11  Joint Exam 03/30/2023  Right  Left  MCP 2   Tender     MCP 3   Tender   Tender  MCP 4      Tender  MCP 5      Tender  PIP 3 (finger)   Tender     PIP 4 (finger)      Tender  Lumbar Spine   Tender     Knee      Tender  Ankle   Tender   Tender     Investigation: No additional findings.  Imaging: No results found.  Recent Labs: Lab Results  Component Value Date   WBC 5.3 03/23/2023   HGB 13.9 03/23/2023   PLT 230 03/23/2023   NA 141 03/23/2023   K 4.3 03/23/2023   CL 107 03/23/2023   CO2 24 03/23/2023   GLUCOSE 123 (H) 03/23/2023   BUN 16 03/23/2023   CREATININE 0.67 03/23/2023   BILITOT 0.5 03/23/2023   ALKPHOS 62 03/19/2020   AST 16 03/23/2023   ALT 17 03/23/2023   PROT 7.7 03/23/2023   ALBUMIN 3.9 03/19/2020   CALCIUM 9.9 03/23/2023   GFRAA >60 12/01/2019   QFTBGOLDPLUS NEGATIVE 04/17/2022    Speciality Comments: No specialty comments available.  Procedures:  No procedures performed Allergies: Hydrochlorothiazide, Lipitor [atorvastatin], Penicillins, and Orange juice [orange oil]   Assessment / Plan:     Visit Diagnoses: Sjogren's syndrome with keratoconjunctivitis sicca (HCC) - Positive  ANA, positive SSA, sicca symptoms, arthralgias:  Patient continues to have ongoing sicca symptoms--encouraged seeing ophthalmology and dentist every 6 months. She has noticed about a 30% improvement in the pain and inflammation involving her hands since initiating Imuran 50 mg 1 tablet daily after her last office visit on 02/03/2023.  She has been tolerating Imuran without any side effects.  No recurrent infections.  No synovitis was noted on examination today but she continues to have tenderness to palpation of several MCP and PIP joints.  She has difficulty performing ADLs at times as well as writing and typing for prolonged periods of time.  Discussed increasing the dose of Imuran to 75 mg daily but she has declined at this time.  She will notify us if she develops any new or worsening symptoms.  The following lab work will be updated with her next lab work in January.  Future orders were placed today.  She was vies notify us if she develops any new or worsening symptoms.  - Plan: CBC with Differential/Platelet, COMPLETE METABOLIC PANEL WITH GFR, Protein / creatinine ratio, urine, C3 and C4, Sedimentation rate, ANA, Sjogrens syndrome-B extractable nuclear antibody, Sjogrens syndrome-A extractable nuclear antibody, Rheumatoid factor, Serum protein electrophoresis with reflex  Inflammatory arthritis - Anti-CCP and rheumatoid factor negative on 10/14/2021.  Ultrasound of both hands was positive for synovitis on 02/12/2022. Family history of rheumatoid arthritis: Patient was initiated on Imuran 50 mg 1 tablet by mouth daily after her last office visit on 02/03/2023.  She has been tolerating Imuran and has not had any interruptions in therapy.  She has noticed about a 30% improvement in her hand pain and inflammation since initiating therapy.  She continues to have difficulty writing and typing for prolonged periods of time.  She continues to have tenderness over several MCP and PIP joints.  No synovitis noted on  exam.  Discussed possibly increasing the dose of Imuran to 75 mg daily but she would like to hold off at this time.  Sed rate will be rechecked with her next lab  work.  She will remain on Imuran 50 mg 1 tablet by mouth daily.  She is vies notify us if she develops any new or worsening symptoms.- Plan: Sedimentation rate  High risk medication use - Imuran 50 mg 1 tablet by mouth daily. Concern for early ILD and concern for hepatorenal toxicity--not be a good candidate for methotrexate. CBC and CMP updated on 03/23/23.  Her next lab work will be due in January and every 3 months to monitor for drug toxicity.  Future orders for CBC and CMP were placed today. Patient is planning on getting the annual flu shot. No recent or recurrent infections.   - Plan: CBC with Differential/Platelet, COMPLETE METABOLIC PANEL WITH GFR  Primary osteoarthritis of both hands: Patient continues to have chronic pain in both hands.  She has difficulty typing and writing for prolonged periods of time.  At times she has difficulties with fine motor skills and grip strength.  Paresthesia of both hands: Not currently symptomatic.  Trochanteric bursitis of both hips: She has tenderness to palpation over bilateral trochanteric bursa.  Status post left knee replacement - Partial-06/03/22 Performed by Dr. Odis Luster.  Chronic pain and limited mobility.  Patient has difficulty standing or walking prolonged distances due to discomfort.  Primary osteoarthritis of right knee: Chronic pain.  No warmth or effusion noted today.  Degeneration of intervertebral disc of lumbar region with discogenic back pain - Limited mobility.  Patient continues to have chronic pain in her lower back.  She has difficulty sitting and standing for prolonged periods of time.  She had a lower back injection performed on 02/17/2023 which helped with her symptoms of radiculopathy but she continues to have midline discomfort.  She remains on gabapentin as prescribed.   Patient states that she may require surgery in the future.  Fibromyalgia: Patient continues to have generalized hyperalgesia and positive tender points on exam.  She has intermittent myalgias and muscle tenderness due to fibromyalgia.  She remains on gabapentin as prescribed and takes methocarbamol 500 mg 3 times daily as needed for muscle spasms.  Anxiety and depression: No longer taking cymbalta.   Osteoporosis screening - DEXA updated on 07/29/22:  DualFemur Neck Left is 0.978 T-score of -0.4. repeat DEXA in 2 years due to history of prednisone use and recent right foot fracture.  Other medical conditions are listed as follows:   Adjustment insomnia  Frequent headaches  Pure hypertriglyceridemia  Hypermobility of joint  Prediabetes  Gastroesophageal reflux disease without esophagitis  Seasonal allergies  Family history of rheumatoid arthritis- two sisters and nephew  Orders: Orders Placed This Encounter  Procedures   CBC with Differential/Platelet   COMPLETE METABOLIC PANEL WITH GFR   Protein / creatinine ratio, urine   C3 and C4   Sedimentation rate   ANA   Sjogrens syndrome-B extractable nuclear antibody   Sjogrens syndrome-A extractable nuclear antibody   Rheumatoid factor   Serum protein electrophoresis with reflex   No orders of the defined types were placed in this encounter.    Follow-Up Instructions: Return in about 3 months (around 06/30/2023) for Sjogren's syndrome, Osteoarthritis.   Gearldine Bienenstock, PA-C  Note - This record has been created using Dragon software.  Chart creation errors have been sought, but may not always  have been located. Such creation errors do not reflect on  the standard of medical care.

## 2023-03-30 ENCOUNTER — Ambulatory Visit: Payer: 59 | Attending: Physician Assistant | Admitting: Physician Assistant

## 2023-03-30 ENCOUNTER — Encounter: Payer: Self-pay | Admitting: Physician Assistant

## 2023-03-30 VITALS — BP 150/93 | HR 89 | Resp 15 | Ht 65.0 in | Wt 270.0 lb

## 2023-03-30 DIAGNOSIS — R7303 Prediabetes: Secondary | ICD-10-CM

## 2023-03-30 DIAGNOSIS — M797 Fibromyalgia: Secondary | ICD-10-CM

## 2023-03-30 DIAGNOSIS — M249 Joint derangement, unspecified: Secondary | ICD-10-CM

## 2023-03-30 DIAGNOSIS — Z79899 Other long term (current) drug therapy: Secondary | ICD-10-CM

## 2023-03-30 DIAGNOSIS — Z13820 Encounter for screening for osteoporosis: Secondary | ICD-10-CM

## 2023-03-30 DIAGNOSIS — Z8261 Family history of arthritis: Secondary | ICD-10-CM

## 2023-03-30 DIAGNOSIS — M1711 Unilateral primary osteoarthritis, right knee: Secondary | ICD-10-CM

## 2023-03-30 DIAGNOSIS — M7062 Trochanteric bursitis, left hip: Secondary | ICD-10-CM

## 2023-03-30 DIAGNOSIS — M19041 Primary osteoarthritis, right hand: Secondary | ICD-10-CM | POA: Diagnosis not present

## 2023-03-30 DIAGNOSIS — E781 Pure hyperglyceridemia: Secondary | ICD-10-CM

## 2023-03-30 DIAGNOSIS — M1990 Unspecified osteoarthritis, unspecified site: Secondary | ICD-10-CM | POA: Diagnosis not present

## 2023-03-30 DIAGNOSIS — F419 Anxiety disorder, unspecified: Secondary | ICD-10-CM

## 2023-03-30 DIAGNOSIS — M3501 Sicca syndrome with keratoconjunctivitis: Secondary | ICD-10-CM

## 2023-03-30 DIAGNOSIS — R202 Paresthesia of skin: Secondary | ICD-10-CM

## 2023-03-30 DIAGNOSIS — M19042 Primary osteoarthritis, left hand: Secondary | ICD-10-CM

## 2023-03-30 DIAGNOSIS — Z96652 Presence of left artificial knee joint: Secondary | ICD-10-CM

## 2023-03-30 DIAGNOSIS — J302 Other seasonal allergic rhinitis: Secondary | ICD-10-CM

## 2023-03-30 DIAGNOSIS — M51369 Other intervertebral disc degeneration, lumbar region without mention of lumbar back pain or lower extremity pain: Secondary | ICD-10-CM

## 2023-03-30 DIAGNOSIS — M5136 Other intervertebral disc degeneration, lumbar region with discogenic back pain only: Secondary | ICD-10-CM

## 2023-03-30 DIAGNOSIS — F5102 Adjustment insomnia: Secondary | ICD-10-CM

## 2023-03-30 DIAGNOSIS — F32A Depression, unspecified: Secondary | ICD-10-CM

## 2023-03-30 DIAGNOSIS — R519 Headache, unspecified: Secondary | ICD-10-CM

## 2023-03-30 DIAGNOSIS — M7061 Trochanteric bursitis, right hip: Secondary | ICD-10-CM

## 2023-03-30 DIAGNOSIS — K219 Gastro-esophageal reflux disease without esophagitis: Secondary | ICD-10-CM

## 2023-03-30 NOTE — Patient Instructions (Addendum)
Vaccines You are taking a medication(s) that can suppress your immune system.  The following immunizations are recommended: Flu annually Covid-19  Td/Tdap (tetanus, diphtheria, pertussis) every 10 years Pneumonia (Prevnar 15 then Pneumovax 23 at least 1 year apart.  Alternatively, can take Prevnar 20 without needing additional dose) Shingrix: 2 doses from 4 weeks to 6 months apart  Please check with your PCP to make sure you are up to date.   Standing Labs We placed an order today for your standing lab work.   Please have your standing labs drawn in January and every 3 months   Please have your labs drawn 2 weeks prior to your appointment so that the provider can discuss your lab results at your appointment, if possible.  Please note that you may see your imaging and lab results in MyChart before we have reviewed them. We will contact you once all results are reviewed. Please allow our office up to 72 hours to thoroughly review all of the results before contacting the office for clarification of your results.  WALK-IN LAB HOURS  Monday through Thursday from 8:00 am -12:30 pm and 1:00 pm-5:00 pm and Friday from 8:00 am-12:00 pm.  Patients with office visits requiring labs will be seen before walk-in labs.  You may encounter longer than normal wait times. Please allow additional time. Wait times may be shorter on  Monday and Thursday afternoons.  We do not book appointments for walk-in labs. We appreciate your patience and understanding with our staff.   Labs are drawn by [facility]. Please bring your co-pay at the time of your lab draw.  You may receive a bill from [facility] for your lab work.  Please note if you are on Hydroxychloroquine and and an order has been placed for a Hydroxychloroquine level,  you will need to have it drawn 4 hours or more after your last dose.  If you wish to have your labs drawn at another location, please call the office 24 hours in advance so we can fax the  orders.  The office is located at [address], [address], [place], [place] [address]   If you have any questions regarding directions or hours of operation,  please call [phone].   As a reminder, please drink plenty of water prior to coming for your lab work. Thanks!

## 2023-04-02 ENCOUNTER — Ambulatory Visit: Payer: Commercial Managed Care - HMO | Admitting: Internal Medicine

## 2023-04-02 VITALS — BP 138/84 | HR 86 | Ht 65.0 in | Wt 271.0 lb

## 2023-04-02 DIAGNOSIS — E66813 Obesity, class 3: Secondary | ICD-10-CM

## 2023-04-02 DIAGNOSIS — M19042 Primary osteoarthritis, left hand: Secondary | ICD-10-CM

## 2023-04-02 DIAGNOSIS — R519 Headache, unspecified: Secondary | ICD-10-CM

## 2023-04-02 DIAGNOSIS — M3500 Sicca syndrome, unspecified: Secondary | ICD-10-CM

## 2023-04-02 DIAGNOSIS — F32A Depression, unspecified: Secondary | ICD-10-CM

## 2023-04-02 DIAGNOSIS — M51369 Other intervertebral disc degeneration, lumbar region without mention of lumbar back pain or lower extremity pain: Secondary | ICD-10-CM

## 2023-04-02 DIAGNOSIS — Z23 Encounter for immunization: Secondary | ICD-10-CM

## 2023-04-02 DIAGNOSIS — F419 Anxiety disorder, unspecified: Secondary | ICD-10-CM

## 2023-04-02 DIAGNOSIS — E781 Pure hyperglyceridemia: Secondary | ICD-10-CM

## 2023-04-02 DIAGNOSIS — F5102 Adjustment insomnia: Secondary | ICD-10-CM

## 2023-04-02 DIAGNOSIS — R7303 Prediabetes: Secondary | ICD-10-CM

## 2023-04-02 DIAGNOSIS — Z6841 Body Mass Index (BMI) 40.0 and over, adult: Secondary | ICD-10-CM

## 2023-04-02 DIAGNOSIS — M797 Fibromyalgia: Secondary | ICD-10-CM

## 2023-04-02 DIAGNOSIS — M19041 Primary osteoarthritis, right hand: Secondary | ICD-10-CM

## 2023-04-02 DIAGNOSIS — K219 Gastro-esophageal reflux disease without esophagitis: Secondary | ICD-10-CM

## 2023-04-02 MED ORDER — PANTOPRAZOLE SODIUM 40 MG PO TBEC: 40.0000 mg | DELAYED_RELEASE_TABLET | Freq: Two times a day (BID) | ORAL | 1 refills | Status: DC

## 2023-04-02 NOTE — Assessment & Plan Note (Signed)
Continue hydroxyzine as needed Support offered

## 2023-04-02 NOTE — Assessment & Plan Note (Signed)
Continue meloxicam, methocarbamol and gabapentin Encouraged weight loss as this can help reduce joint pain

## 2023-04-02 NOTE — Assessment & Plan Note (Signed)
Continue imuran as prescribed by rheumatology

## 2023-04-02 NOTE — Assessment & Plan Note (Signed)
Avoid things that trigger your reflux Encouraged weight loss as this can help reduce reflux Continue lansoprazole, tums and famotadine as needed

## 2023-04-02 NOTE — Patient Instructions (Signed)
Prediabetes Eating Plan Prediabetes is a condition that causes blood sugar (glucose) levels to be higher than normal. This increases the risk for developing type 2 diabetes (type 2 diabetes mellitus). Working with a health care provider or nutrition specialist (dietitian) to make diet and lifestyle changes can help prevent the onset of diabetes. These changes may help you: Control your blood glucose levels. Improve your cholesterol levels. Manage your blood pressure. What are tips for following this plan? Reading food labels Read food labels to check the amount of fat, salt (sodium), and sugar in prepackaged foods. Avoid foods that have: Saturated fats. Trans fats. Added sugars. Avoid foods that have more than 300 milligrams (mg) of sodium per serving. Limit your sodium intake to less than 2,300 mg each day. Shopping Avoid buying pre-made and processed foods. Avoid buying drinks with added sugar. Cooking Cook with olive oil. Do not use butter, lard, or ghee. Bake, broil, grill, steam, or boil foods. Avoid frying. Meal planning  Work with your dietitian to create an eating plan that is right for you. This may include tracking how many calories you take in each day. Use a food diary, notebook, or mobile application to track what you eat at each meal. Consider following a Mediterranean diet. This includes: Eating several servings of fresh fruits and vegetables each day. Eating fish at least twice a week. Eating one serving each day of whole grains, beans, nuts, and seeds. Using olive oil instead of other fats. Limiting alcohol. Limiting red meat. Using nonfat or low-fat dairy products. Consider following a plant-based diet. This includes dietary choices that focus on eating mostly vegetables and fruit, grains, beans, nuts, and seeds. If you have high blood pressure, you may need to limit your sodium intake or follow a diet such as the DASH (Dietary Approaches to Stop Hypertension) eating  plan. The DASH diet aims to lower high blood pressure. Lifestyle Set weight loss goals with help from your health care team. It is recommended that most people with prediabetes lose 7% of their body weight. Exercise for at least 30 minutes 5 or more days a week. Attend a support group or seek support from a mental health counselor. Take over-the-counter and prescription medicines only as told by your health care provider. What foods are recommended? Fruits Berries. Bananas. Apples. Oranges. Grapes. Papaya. Mango. Pomegranate. Kiwi. Grapefruit. Cherries. Vegetables Lettuce. Spinach. Peas. Beets. Cauliflower. Cabbage. Broccoli. Carrots. Tomatoes. Squash. Eggplant. Herbs. Peppers. Onions. Cucumbers. Brussels sprouts. Grains Whole grains, such as whole-wheat or whole-grain breads, crackers, cereals, and pasta. Unsweetened oatmeal. Bulgur. Barley. Quinoa. Brown rice. Corn or whole-wheat flour tortillas or taco shells. Meats and other proteins Seafood. Poultry without skin. Lean cuts of pork and beef. Tofu. Eggs. Nuts. Beans. Dairy Low-fat or fat-free dairy products, such as yogurt, cottage cheese, and cheese. Beverages Water. Tea. Coffee. Sugar-free or diet soda. Seltzer water. Low-fat or nonfat milk. Milk alternatives, such as soy or almond milk. Fats and oils Olive oil. Canola oil. Sunflower oil. Grapeseed oil. Avocado. Walnuts. Sweets and desserts Sugar-free or low-fat pudding. Sugar-free or low-fat ice cream and other frozen treats. Seasonings and condiments Herbs. Sodium-free spices. Mustard. Relish. Low-salt, low-sugar ketchup. Low-salt, low-sugar barbecue sauce. Low-fat or fat-free mayonnaise. The items listed above may not be a complete list of recommended foods and beverages. Contact a dietitian for more information. What foods are not recommended? Fruits Fruits canned with syrup. Vegetables Canned vegetables. Frozen vegetables with butter or cream sauce. Grains Refined white  flour and flour   products, such as bread, pasta, snack foods, and cereals. Meats and other proteins Fatty cuts of meat. Poultry with skin. Breaded or fried meat. Processed meats. Dairy Full-fat yogurt, cheese, or milk. Beverages Sweetened drinks, such as iced tea and soda. Fats and oils Butter. Lard. Ghee. Sweets and desserts Baked goods, such as cake, cupcakes, pastries, cookies, and cheesecake. Seasonings and condiments Spice mixes with added salt. Ketchup. Barbecue sauce. Mayonnaise. The items listed above may not be a complete list of foods and beverages that are not recommended. Contact a dietitian for more information. Where to find more information American Diabetes Association: www.diabetes.org Summary You may need to make diet and lifestyle changes to help prevent the onset of diabetes. These changes can help you control blood sugar, improve cholesterol levels, and manage blood pressure. Set weight loss goals with help from your health care team. It is recommended that most people with prediabetes lose 7% of their body weight. Consider following a Mediterranean diet. This includes eating plenty of fresh fruits and vegetables, whole grains, beans, nuts, seeds, fish, and low-fat dairy, and using olive oil instead of other fats. This information is not intended to replace advice given to you by your health care provider. Make sure you discuss any questions you have with your health care provider. Document Revised: 09/01/2019 Document Reviewed: 09/01/2019 Elsevier Patient Education  2024 Elsevier Inc.  

## 2023-04-02 NOTE — Assessment & Plan Note (Signed)
Continue lunesta.

## 2023-04-02 NOTE — Progress Notes (Signed)
Subjective:    Patient ID: [name], female    DOB: [birth year], 57 y.o.   MRN: [id number]  HPI  Patient presents to clinic today for 67-month follow-up of chronic conditions.  GERD: Triggered by medication, stress.  She has occasional breakthrough on lansoprazole for which she takes pepto-bismol, tums and famotidine OTC as needed.  Upper GI from 03/2020 reviewed.  OA/fibromyalgia.  Managed with meloxicam, methocarbamol and gabapentin.  She does not exercise.  She follows with orthopedics.  Anxiety and depression: Chronic, managed on hydroxyzine as needed. She is no longer taking duloxetine.  She is not currently seeing a therapist.  She denies SI/HI.  Frequent headaches: These occur every other day. Triggered by stress.  She takes tylenol as needed with some relief of symptoms.  HLD: Her last LDL was 128, triglycerides 135, 09/2022.  She is not taking rosuvastatin as prescribed because she is taking too much medications.  She does not consume a low-fat diet.  Insomnia: She has difficulty falling and staying asleep.  She takes lunesta as needed with minimal relief of symptoms.  There is no sleep study on file.  Prediabetes: Her last A1c was 6.2%, 09/2022.  She is not taking any oral diabetic medication at this time.  She does not check her sugars.  Sjogren's: She reports dry mouth and dry eyes.  She is taking imuran as prescribed.  She follows with rheumatology.  Interstitial lung disease: She reports intermittent wheezing but denies cough or shortness of breath.  She is not taking breztri but is taking albuterol as prescribed.  PFTs from 12/2022 reviewed.  She follows with pulmonology.  Review of Systems     Past Medical History:  Diagnosis Date   Allergy    Anemia    h/o   Anxiety    DDD (degenerative disc disease), lumbar    Depression    Fibromyalgia    GERD (gastroesophageal reflux disease)    Morbid obesity (HCC)    Pneumonia 01/2022   PONV (postoperative  nausea and vomiting)    Pre-diabetes    Sjogren's syndrome (HCC)     Current Outpatient Medications  Medication Sig Dispense Refill   acetaminophen (TYLENOL 8 HOUR ARTHRITIS PAIN) 650 MG CR tablet Take 1,300 mg by mouth daily.     albuterol (VENTOLIN HFA) 108 (90 Base) MCG/ACT inhaler TAKE 2 PUFFS BY MOUTH EVERY 6 HOURS AS NEEDED FOR WHEEZE OR SHORTNESS OF BREATH 8.5 each 2   ALLEGRA-D ALLERGY & CONGESTION 180-240 MG 24 hr tablet TAKE 1 TABLET BY MOUTH EVERY DAY AS NEEDED (Patient taking differently: 1 tablet as needed.) 30 tablet 1   azaTHIOprine (IMURAN) 50 MG tablet Take 1 tablet (50 mg total) by mouth daily. 30 tablet 2   Budeson-Glycopyrrol-Formoterol (BREZTRI AEROSPHERE) 160-9-4.8 MCG/ACT AERO Inhale 2 puffs into the lungs in the morning and at bedtime. (Patient taking differently: Inhale 2 puffs into the lungs as needed.) 5.9 g 0   cyclobenzaprine (FLEXERIL) 10 MG tablet Take 10 mg by mouth at bedtime as needed. (Patient not taking: Reported on 12/24/2022)     diclofenac Sodium (VOLTAREN) 1 % GEL APPLY 2 GRAMS TO AFFECTED AREA 4 TIMES A DAY (Patient taking differently: 2 g as needed.) 100 g 0   DULoxetine (CYMBALTA) 20 MG capsule TAKE 1 CAPSULE BY MOUTH EVERY DAY (Patient not taking: Reported on 02/03/2023) 90 capsule 0   eszopiclone (LUNESTA) 2 MG TABS tablet Take 1 tablet (2 mg total) by mouth at bedtime as needed  for sleep. Take immediately before bedtime 30 tablet 0   famotidine (PEPCID) 20 MG tablet TAKE 1 TABLET (20 MG TOTAL) BY MOUTH 2 (TWO) TIMES DAILY AS NEEDED FOR HEARTBURN OR INDIGESTION. 180 tablet 0   gabapentin (NEURONTIN) 300 MG capsule Take 300 mg by mouth 2 (two) times daily.     hydrOXYzine (VISTARIL) 25 MG capsule Take 1 capsule (25 mg total) by mouth daily as needed. 90 capsule 1   lansoprazole (PREVACID) 30 MG capsule Take 1 capsule (30 mg total) by mouth 2 (two) times daily before a meal. 180 capsule 0   meloxicam (MOBIC) 7.5 MG tablet Take 7.5-15 mg by mouth daily.      methocarbamol (ROBAXIN) 500 MG tablet Take 500 mg by mouth 3 (three) times daily as needed.     Multiple Vitamin (MULTIVITAMIN PO) Take by mouth daily.     rosuvastatin (CRESTOR) 5 MG tablet Take 1 tablet (5 mg total) by mouth 3 (three) times a week. (Patient not taking: Reported on 11/03/2022) 45 tablet 1   VITAMIN D PO Take 1 tablet by mouth 2 (two) times a week.     No current facility-administered medications for this visit.    Allergies  Allergen Reactions   Hydrochlorothiazide Other (See Comments)    The pt state she couldn't function and felt like she was dying    Lipitor [Atorvastatin]     Muscle pain   Penicillins Other (See Comments)    Childhood allergy Has patient had a PCN reaction causing immediate rash, facial/tongue/throat swelling, SOB or lightheadedness with hypotension: Unknown Has patient had a PCN reaction causing severe rash involving mucus membranes or skin necrosis: Unknown Has patient had a PCN reaction that required hospitalization: No  Has patient had a PCN reaction occurring within the last 10 years: No  If all of the above answers are "NO", then may proceed with Cephalosporin use.    Orange Juice [Orange Oil] Rash    rash    Family History  Problem Relation Age of Onset   Hyperlipidemia Mother    Rheum arthritis Sister    Osteoarthritis Sister    Fibroids Sister    Fibroids Sister    Fibroids Sister    Heart disease Maternal Aunt    Stroke Maternal Aunt    Diabetes Maternal Aunt    Heart disease Maternal Uncle    Stroke Maternal Uncle    Rheum arthritis Nephew    Cancer Neg Hx     Social History   Socioeconomic History   Marital status: Married    Spouse name: Not on file   Number of children: Not on file   Years of education: Not on file   Highest education level: Not on file  Occupational History   Not on file  Tobacco Use   Smoking status: Never    Passive exposure: Current   Smokeless tobacco: Never  Vaping Use   Vaping  status: Never Used  Substance and Sexual Activity   Alcohol use: Yes    Comment: occ   Drug use: Not Currently    Types: Marijuana   Sexual activity: Yes    Partners: Male    Birth control/protection: Post-menopausal, Surgical  Other Topics Concern   Not on file  Social History Narrative   Not on file   Social Determinants of Health   Financial Resource Strain: Not on file  Food Insecurity: No Food Insecurity (06/03/2022)   Hunger Vital Sign    Worried About Running  Out of Food in the Last Year: Never true    Ran Out of Food in the Last Year: Never true  Transportation Needs: No Transportation Needs (06/03/2022)   PRAPARE - Administrator, Civil Service (Medical): No    Lack of Transportation (Non-Medical): No  Physical Activity: Not on file  Stress: Not on file  Social Connections: Not on file  Intimate Partner Violence: Not At Risk (06/03/2022)   Humiliation, Afraid, Rape, and Kick questionnaire    Fear of Current or Ex-Partner: No    Emotionally Abused: No    Physically Abused: No    Sexually Abused: No     Constitutional: Patient reports intermittent headaches, chronic fatigue.  Denies fever, malaise, or abrupt weight changes.  HEENT: Denies eye pain, eye redness, ear pain, ringing in the ears, wax buildup, runny nose, nasal congestion, bloody nose, or sore throat. Respiratory: Denies difficulty breathing, shortness of breath, cough or sputum production.   Cardiovascular: Denies chest pain, chest tightness, palpitations or swelling in the hands or feet.  Gastrointestinal: Patient reports intermittent reflux.  Denies abdominal pain, bloating, constipation, diarrhea or blood in the stool.  GU: Denies urgency, frequency, pain with urination, burning sensation, blood in urine, odor or discharge. Musculoskeletal: Patient reports chronic joint and muscle pain.  Denies decrease in range of motion, difficulty with gait, or joint swelling.  Skin: Denies redness,  rashes, lesions or ulcercations.  Neurological: Patient reports insomnia.  Denies dizziness, difficulty with memory, difficulty with speech or problems with balance and coordination.  Psych: Patient has a history of anxiety and depression.  Denies SI/HI.  No other specific complaints in a complete review of systems (except as listed in HPI above).  Objective:   Physical Exam BP 138/84   Pulse 86   Ht 5\' 5"  (1.651 m)   Wt 271 lb (122.9 kg)   SpO2 98%   BMI 45.10 kg/m   Wt Readings from Last 3 Encounters:  03/30/23 270 lb (122.5 kg)  02/03/23 274 lb 9.6 oz (124.6 kg)  12/24/22 274 lb 12.8 oz (124.6 kg)    General: Appears her stated age, obese, in NAD. Skin: Warm, dry and intact.  HEENT: Head: normal shape and size; Eyes: sclera white, no icterus, conjunctiva pink, PERRLA and EOMs intact;  Cardiovascular: Normal rate and rhythm. S1,S2 noted.  No murmur, rubs or gallops noted. No JVD or BLE edema. No carotid bruits noted. Pulmonary/Chest: Normal effort and positive vesicular breath sounds. No respiratory distress. No wheezes, rales or ronchi noted.  Abdomen: Soft and nontender. Normal bowel sounds.  Musculoskeletal: Gait slow and steady without device. Neurological: Alert and oriented. Coordination normal.  Psychiatric: Mood and affect normal. Behavior is normal. Judgment and thought content normal.     BMET    Component Value Date/Time   NA 141 03/23/2023 1038   NA 142 09/18/2016 0929   K 4.3 03/23/2023 1038   CL 107 03/23/2023 1038   CO2 24 03/23/2023 1038   GLUCOSE 123 (H) 03/23/2023 1038   BUN 16 03/23/2023 1038   BUN 9 09/18/2016 0929   CREATININE 0.67 03/23/2023 1038   CALCIUM 9.9 03/23/2023 1038   GFRNONAA >60 01/15/2022 2334   GFRAA >60 12/01/2019 1558    Lipid Panel     Component Value Date/Time   CHOL 214 (H) 10/07/2022 1402   CHOL 200 (H) 03/23/2017 0844   TRIG 135 10/07/2022 1402   HDL 61 10/07/2022 1402   HDL 57 03/23/2017 0844  CHOLHDL 3.5  10/07/2022 1402   VLDL 36.2 03/19/2020 0918   LDLCALC 128 (H) 10/07/2022 1402    CBC    Component Value Date/Time   WBC 5.3 03/23/2023 1038   RBC 4.48 03/23/2023 1038   HGB 13.9 03/23/2023 1038   HGB 13.2 09/18/2016 0929   HCT 43.0 03/23/2023 1038   HCT 38.1 09/18/2016 0929   PLT 230 03/23/2023 1038   PLT 268 09/18/2016 0929   MCV 96.0 03/23/2023 1038   MCV 94 09/18/2016 0929   MCH 31.0 03/23/2023 1038   MCHC 32.3 03/23/2023 1038   RDW 13.1 03/23/2023 1038   RDW 12.8 09/18/2016 0929   LYMPHSABS 1,357 03/23/2023 1038   LYMPHSABS 2.8 08/03/2014 1609   MONOABS 0.6 08/30/2019 0315   EOSABS 80 03/23/2023 1038   EOSABS 0.1 08/03/2014 1609   BASOSABS 21 03/23/2023 1038   BASOSABS 0.0 08/03/2014 1609    Hgb A1C Lab Results  Component Value Date   HGBA1C 6.2 (H) 10/07/2022            Assessment & Plan:     RTC in 6 months for annual exam Nicki Reaper, NP

## 2023-04-02 NOTE — Assessment & Plan Note (Signed)
Continue meloxicam, methocarbamol and gabapentin She will continue to follow up with orthopedics

## 2023-04-02 NOTE — Assessment & Plan Note (Signed)
Try to avoid triggers Continue tylenol as needed

## 2023-04-02 NOTE — Assessment & Plan Note (Signed)
A1C today Encouraged low carb diet and exercise for weight loss

## 2023-04-02 NOTE — Assessment & Plan Note (Signed)
Continue meloxicam, methocarbamol and gabapentin per orthopedics and pain management Encouraged weight loss as this can help reduce joint pain

## 2023-04-02 NOTE — Assessment & Plan Note (Signed)
CMET and lipid profile today Encouraged low fat diet Not taking rosuvastatin as precribed

## 2023-04-02 NOTE — Assessment & Plan Note (Signed)
Encouraged diet and exercise for weight loss ?

## 2023-04-03 ENCOUNTER — Telehealth: Payer: Self-pay

## 2023-04-03 LAB — CBC
HCT: 39.7 % (ref 35.0–45.0)
Hemoglobin: 12.9 g/dL (ref 11.7–15.5)
MCH: 30.4 pg (ref 27.0–33.0)
MCHC: 32.5 g/dL (ref 32.0–36.0)
MCV: 93.4 fL (ref 80.0–100.0)
MPV: 11.4 fL (ref 7.5–12.5)
Platelets: 237 10*3/uL (ref 140–400)
RBC: 4.25 10*6/uL (ref 3.80–5.10)
RDW: 13.2 % (ref 11.0–15.0)
WBC: 5.9 10*3/uL (ref 3.8–10.8)

## 2023-04-03 LAB — COMPLETE METABOLIC PANEL WITH GFR
AG Ratio: 1.3 (calc) (ref 1.0–2.5)
ALT: 13 U/L (ref 6–29)
AST: 14 U/L (ref 10–35)
Albumin: 4 g/dL (ref 3.6–5.1)
Alkaline phosphatase (APISO): 84 U/L (ref 37–153)
BUN: 14 mg/dL (ref 7–25)
CO2: 26 mmol/L (ref 20–32)
Calcium: 9.8 mg/dL (ref 8.6–10.4)
Chloride: 107 mmol/L (ref 98–110)
Creat: 0.75 mg/dL (ref 0.50–1.03)
Globulin: 3.2 g/dL (ref 1.9–3.7)
Glucose, Bld: 96 mg/dL (ref 65–139)
Potassium: 4.2 mmol/L (ref 3.5–5.3)
Sodium: 140 mmol/L (ref 135–146)
Total Bilirubin: 0.5 mg/dL (ref 0.2–1.2)
Total Protein: 7.2 g/dL (ref 6.1–8.1)
eGFR: 93 mL/min/{1.73_m2} (ref >=60)

## 2023-04-03 LAB — LIPID PANEL
Cholesterol: 228 mg/dL — ABNORMAL HIGH (ref <200)
HDL: 70 mg/dL (ref >=50)
LDL Cholesterol (Calc): 133 mg/dL — ABNORMAL HIGH
Non-HDL Cholesterol (Calc): 158 mg/dL — ABNORMAL HIGH (ref <130)
Total CHOL/HDL Ratio: 3.3 (calc) (ref <5.0)
Triglycerides: 134 mg/dL (ref <150)

## 2023-04-03 LAB — HEMOGLOBIN A1C
Hgb A1c MFr Bld: 6.5 %{Hb} — ABNORMAL HIGH (ref <5.7)
Mean Plasma Glucose: 140 mg/dL
eAG (mmol/L): 7.7 mmol/L

## 2023-04-03 NOTE — Telephone Encounter (Signed)
-----   Message from [facility] sent at 04/03/2023  9:48 AM EDT ----- Have her set up an appointment to discuss her labs.  New onset diabetes.

## 2023-04-03 NOTE — Telephone Encounter (Signed)
Patient aware of results and appointment scheduled. 

## 2023-04-08 ENCOUNTER — Telehealth: Payer: Self-pay | Admitting: Internal Medicine

## 2023-04-08 ENCOUNTER — Ambulatory Visit: Payer: Commercial Managed Care - HMO | Admitting: Internal Medicine

## 2023-04-09 ENCOUNTER — Encounter: Payer: Self-pay | Admitting: Internal Medicine

## 2023-04-09 ENCOUNTER — Telehealth: Payer: Self-pay | Admitting: Internal Medicine

## 2023-04-10 ENCOUNTER — Other Ambulatory Visit: Payer: Self-pay | Admitting: Internal Medicine

## 2023-04-10 ENCOUNTER — Encounter: Payer: Self-pay | Admitting: Internal Medicine

## 2023-04-10 ENCOUNTER — Ambulatory Visit: Payer: Commercial Managed Care - HMO | Admitting: Internal Medicine

## 2023-04-10 ENCOUNTER — Telehealth (INDEPENDENT_AMBULATORY_CARE_PROVIDER_SITE_OTHER): Payer: Self-pay | Admitting: Internal Medicine

## 2023-04-10 DIAGNOSIS — E66813 Obesity, class 3: Secondary | ICD-10-CM

## 2023-04-10 DIAGNOSIS — E1165 Type 2 diabetes mellitus with hyperglycemia: Secondary | ICD-10-CM | POA: Insufficient documentation

## 2023-04-10 DIAGNOSIS — E669 Obesity, unspecified: Secondary | ICD-10-CM | POA: Diagnosis not present

## 2023-04-10 DIAGNOSIS — E1169 Type 2 diabetes mellitus with other specified complication: Secondary | ICD-10-CM

## 2023-04-10 MED ORDER — ESZOPICLONE 2 MG PO TABS: 2.0000 mg | ORAL_TABLET | Freq: Every evening | ORAL | 0 refills | Status: DC | PRN

## 2023-04-10 MED ORDER — PANTOPRAZOLE SODIUM 40 MG PO TBEC: 40.0000 mg | DELAYED_RELEASE_TABLET | Freq: Every day | ORAL | 1 refills | Status: DC

## 2023-04-10 NOTE — Telephone Encounter (Signed)
Requested medication (s) are due for refill today: No  Requested medication (s) are on the active medication list: No  Last refill:  04/10/23  Future visit scheduled: Yes  Notes to clinic:  Pharmacy request alternative, not covered by protonix not covered     Requested Prescriptions  Pending Prescriptions Disp Refills   RABEprazole (ACIPHEX) 20 MG tablet [Pharmacy Med Name: RABEPRAZOLE SOD DR 20 MG TAB]  0     Gastroenterology: Proton Pump Inhibitors Passed - 04/10/2023  8:55 AM      Passed - Valid encounter within last 12 months    Recent Outpatient Visits           Today Type 2 diabetes mellitus with hyperglycemia, without long-term current use of insulin [facility])   [place] [facility] [place], [place] W, NP   1 week ago Gastroesophageal reflux disease without esophagitis   [place] [facility] [place], Salvadore Oxford, NP   6 months ago Encounter for general adult medical examination with abnormal findings   [place] [facility] [place], Salvadore Oxford, NP   8 months ago DDD (degenerative disc disease), lumbar   [place] [facility] [place], Salvadore Oxford, NP   1 year ago Community acquired pneumonia of left lower lobe of lung   [place] [facility] [place], Salvadore Oxford, NP       Future Appointments             In 3 weeks Chilton [place], MD [facility] [place]   In 2 months Rachel Bo [facility]umat[facility]   In 6 months Ba[place], Salvadore Oxford, NP [facility], [facility]

## 2023-04-10 NOTE — Assessment & Plan Note (Signed)
She recently restarted rosuvastatin Encouraged low fat diet

## 2023-04-10 NOTE — Progress Notes (Signed)
(  Key: [id number]) PA Case ID #: [id number] Rx #: [id number] Need Help? Call us at [phone] Outcome Approved today by [facility] NCPDP 2017 Your PA request has been approved. Additional information will be provided in the approval communication. (Message 1145) Authorization Expiration Date: 04/08/2024

## 2023-04-10 NOTE — Assessment & Plan Note (Addendum)
Discussed diabetes and standards of medical care Encouraged low carb diet and exercise for weight loss She declines referral for diabetes education or nutrition She would like to hold off on medication at this time Encouraged routine eye exam Encouraged routine foot exam Discussed immunizations

## 2023-04-10 NOTE — Progress Notes (Signed)
(  [name]) PA Case ID #: [id number] Need Help? Call us at [phone] Outcome Approved today by [facility] NCPDP 2017 Your PA request has been approved. Additional information will be provided in the approval communication. (Message 1145) Authorization Expiration Date: 04/09/2024

## 2023-04-10 NOTE — Assessment & Plan Note (Signed)
Encouraged diet and exercise for weight loss ?

## 2023-04-10 NOTE — Progress Notes (Signed)
Virtual Visit via Video Note  I connected with [name] on 04/10/23 at  4:00 PM EDT by a video enabled telemedicine application and verified that I am speaking with the correct person using two identifiers.  Location: Patient: Home Provider: Office  Persons participating in this video call: Nicki Reaper, NP and Kandis Fantasia   I discussed the limitations of evaluation and management by telemedicine and the availability of in person appointments. The patient expressed understanding and agreed to proceed.  History of Present Illness:  Patient due for follow-up of recent labs.  She had a recent A1c of 6.5% indicating new onset diabetes.  She had been prediabetic in the past.  Her recent LDL was 134, triglycerides 134.  She is not taking any oral diabetic medication at this time.  She is taking rosuvastatin 3 times weekly.   Past Medical History:  Diagnosis Date   Allergy    Anemia    h/o   Anxiety    DDD (degenerative disc disease), lumbar    Depression    Fibromyalgia    GERD (gastroesophageal reflux disease)    Morbid obesity (HCC)    Pneumonia 01/2022   PONV (postoperative nausea and vomiting)    Pre-diabetes    Sjogren's syndrome (HCC)     Current Outpatient Medications  Medication Sig Dispense Refill   acetaminophen (TYLENOL 8 HOUR ARTHRITIS PAIN) 650 MG CR tablet Take 1,300 mg by mouth daily.     albuterol (VENTOLIN HFA) 108 (90 Base) MCG/ACT inhaler TAKE 2 PUFFS BY MOUTH EVERY 6 HOURS AS NEEDED FOR WHEEZE OR SHORTNESS OF BREATH 8.5 each 2   ALLEGRA-D ALLERGY & CONGESTION 180-240 MG 24 hr tablet TAKE 1 TABLET BY MOUTH EVERY DAY AS NEEDED (Patient taking differently: 1 tablet as needed.) 30 tablet 1   azaTHIOprine (IMURAN) 50 MG tablet Take 1 tablet (50 mg total) by mouth daily. 30 tablet 2   cyclobenzaprine (FLEXERIL) 10 MG tablet Take 10 mg by mouth at bedtime as needed.     diclofenac Sodium (VOLTAREN) 1 % GEL APPLY 2 GRAMS TO AFFECTED AREA 4 TIMES A DAY  (Patient taking differently: 2 g as needed.) 100 g 0   eszopiclone (LUNESTA) 2 MG TABS tablet Take 1 tablet (2 mg total) by mouth at bedtime as needed for sleep. Take immediately before bedtime 30 tablet 0   famotidine (PEPCID) 20 MG tablet TAKE 1 TABLET (20 MG TOTAL) BY MOUTH 2 (TWO) TIMES DAILY AS NEEDED FOR HEARTBURN OR INDIGESTION. 180 tablet 0   gabapentin (NEURONTIN) 300 MG capsule Take 300 mg by mouth 2 (two) times daily.     hydrOXYzine (VISTARIL) 25 MG capsule Take 1 capsule (25 mg total) by mouth daily as needed. 90 capsule 1   meloxicam (MOBIC) 7.5 MG tablet Take 7.5-15 mg by mouth daily.     methocarbamol (ROBAXIN) 500 MG tablet Take 500 mg by mouth 3 (three) times daily as needed.     Multiple Vitamin (MULTIVITAMIN PO) Take by mouth daily.     pantoprazole (PROTONIX) 40 MG tablet Take 1 tablet (40 mg total) by mouth daily. 90 tablet 1   rosuvastatin (CRESTOR) 5 MG tablet Take 1 tablet (5 mg total) by mouth 3 (three) times a week. 45 tablet 1   VITAMIN D PO Take 1 tablet by mouth 2 (two) times a week.     No current facility-administered medications for this visit.    Allergies  Allergen Reactions   Hydrochlorothiazide Other (See Comments)  The pt state she couldn't function and felt like she was dying    Lipitor [Atorvastatin]     Muscle pain   Penicillins Other (See Comments)    Childhood allergy Has patient had a PCN reaction causing immediate rash, facial/tongue/throat swelling, SOB or lightheadedness with hypotension: Unknown Has patient had a PCN reaction causing severe rash involving mucus membranes or skin necrosis: Unknown Has patient had a PCN reaction that required hospitalization: No  Has patient had a PCN reaction occurring within the last 10 years: No  If all of the above answers are "NO", then may proceed with Cephalosporin use.    Orange Juice [Orange Oil] Rash    rash    Family History  Problem Relation Age of Onset   Hyperlipidemia Mother    Rheum  arthritis Sister    Osteoarthritis Sister    Fibroids Sister    Fibroids Sister    Fibroids Sister    Heart disease Maternal Aunt    Stroke Maternal Aunt    Diabetes Maternal Aunt    Heart disease Maternal Uncle    Stroke Maternal Uncle    Rheum arthritis Nephew    Cancer Neg Hx     Social History   Socioeconomic History   Marital status: Married    Spouse name: Not on file   Number of children: Not on file   Years of education: Not on file   Highest education level: Some college, no degree  Occupational History   Not on file  Tobacco Use   Smoking status: Never    Passive exposure: Current   Smokeless tobacco: Never  Vaping Use   Vaping status: Never Used  Substance and Sexual Activity   Alcohol use: Yes    Comment: occ   Drug use: Not Currently    Types: Marijuana   Sexual activity: Yes    Partners: Male    Birth control/protection: Post-menopausal, Surgical  Other Topics Concern   Not on file  Social History Narrative   Not on file   Social Determinants of Health   Financial Resource Strain: Medium Risk (04/02/2023)   Overall Financial Resource Strain (CARDIA)    Difficulty of Paying Living Expenses: Somewhat hard  Food Insecurity: Food Insecurity Present (04/02/2023)   Hunger Vital Sign    Worried About Running Out of Food in the Last Year: Sometimes true    Ran Out of Food in the Last Year: Sometimes true  Transportation Needs: No Transportation Needs (04/02/2023)   PRAPARE - Administrator, Civil Service (Medical): No    Lack of Transportation (Non-Medical): No  Physical Activity: Insufficiently Active (04/02/2023)   Exercise Vital Sign    Days of Exercise per Week: 3 days    Minutes of Exercise per Session: 20 min  Stress: Stress Concern Present (04/02/2023)   [employer] of Occupational Health - Occupational Stress Questionnaire    Feeling of Stress : To some extent  Social Connections: Moderately Integrated (04/02/2023)    Social Connection and Isolation Panel [NHANES]    Frequency of Communication with Friends and Family: More than three times a week    Frequency of Social Gatherings with Friends and Family: Three times a week    Attends Religious Services: 1 to 4 times per year    Active Member of Clubs or Organizations: No    Attends Banker Meetings: Not on file    Marital Status: Married  Intimate Partner Violence: Not At Risk (06/03/2022)  Humiliation, Afraid, Rape, and Kick questionnaire    Fear of Current or Ex-Partner: No    Emotionally Abused: No    Physically Abused: No    Sexually Abused: No     Constitutional: Patient reports intermittent headaches.  Denies fever, malaise, fatigue, or abrupt weight changes.  HEENT: Denies eye pain, eye redness, ear pain, ringing in the ears, wax buildup, runny nose, nasal congestion, bloody nose, or sore throat. Respiratory: Denies difficulty breathing, shortness of breath, cough or sputum production.   Cardiovascular: Denies chest pain, chest tightness, palpitations or swelling in the hands or feet.  Gastrointestinal: Denies abdominal pain, bloating, constipation, diarrhea or blood in the stool.  GU: Denies urgency, frequency, pain with urination, burning sensation, blood in urine, odor or discharge. Musculoskeletal: Patient reports chronic joint and muscle pain.  Denies decrease in range of motion, difficulty with gait, or joint swelling.  Skin: Denies redness, rashes, lesions or ulcercations.  Neurological: Patient reports insomnia.  Denies dizziness, difficulty with memory, difficulty with speech or problems with balance and coordination.  Psych: Patient has a history of anxiety and depression.  Denies SI/HI.  No other specific complaints in a complete review of systems (except as listed in HPI above).    Observations/Objective:  Wt Readings from Last 3 Encounters:  04/02/23 271 lb (122.9 kg)  03/30/23 270 lb (122.5 kg)  02/03/23 274 lb  9.6 oz (124.6 kg)    General: Appears her stated age, obese, in NAD. Skin: Warm, dry and intact. No ulcerations noted. Pulmonary/Chest: Normal effort. No respiratory distress.  Neurological: Alert and oriented.   BMET    Component Value Date/Time   NA 140 04/02/2023 1408   NA 142 09/18/2016 0929   K 4.2 04/02/2023 1408   CL 107 04/02/2023 1408   CO2 26 04/02/2023 1408   GLUCOSE 96 04/02/2023 1408   BUN 14 04/02/2023 1408   BUN 9 09/18/2016 0929   CREATININE 0.75 04/02/2023 1408   CALCIUM 9.8 04/02/2023 1408   GFRNONAA >60 01/15/2022 2334   GFRAA >60 12/01/2019 1558    Lipid Panel     Component Value Date/Time   CHOL 228 (H) 04/02/2023 1408   CHOL 200 (H) 03/23/2017 0844   TRIG 134 04/02/2023 1408   HDL 70 04/02/2023 1408   HDL 57 03/23/2017 0844   CHOLHDL 3.3 04/02/2023 1408   VLDL 36.2 03/19/2020 0918   LDLCALC 133 (H) 04/02/2023 1408    CBC    Component Value Date/Time   WBC 5.9 04/02/2023 1408   RBC 4.25 04/02/2023 1408   HGB 12.9 04/02/2023 1408   HGB 13.2 09/18/2016 0929   HCT 39.7 04/02/2023 1408   HCT 38.1 09/18/2016 0929   PLT 237 04/02/2023 1408   PLT 268 09/18/2016 0929   MCV 93.4 04/02/2023 1408   MCV 94 09/18/2016 0929   MCH 30.4 04/02/2023 1408   MCHC 32.5 04/02/2023 1408   RDW 13.2 04/02/2023 1408   RDW 12.8 09/18/2016 0929   LYMPHSABS 1,357 03/23/2023 1038   LYMPHSABS 2.8 08/03/2014 1609   MONOABS 0.6 08/30/2019 0315   EOSABS 80 03/23/2023 1038   EOSABS 0.1 08/03/2014 1609   BASOSABS 21 03/23/2023 1038   BASOSABS 0.0 08/03/2014 1609    Hgb A1C Lab Results  Component Value Date   HGBA1C 6.5 (H) 04/02/2023        Assessment and Plan:  RTC in 3 months, follow up chronic conditions  Follow Up Instructions:    I discussed the assessment and  treatment plan with the patient. The patient was provided an opportunity to ask questions and all were answered. The patient agreed with the plan and demonstrated an understanding of the  instructions.   The patient was advised to call back or seek an in-person evaluation if the symptoms worsen or if the condition fails to improve as anticipated.     Nicki Reaper, NP

## 2023-04-10 NOTE — Progress Notes (Signed)
(  Key: [id number])  form thumbnail This request has received an approval. View the bottom of the request for an electronic copy of the approval letter.

## 2023-04-10 NOTE — Patient Instructions (Signed)

## 2023-04-10 NOTE — Progress Notes (Signed)
(  Key: [id number])  form thumbnail Your information has been submitted to [employer]. To check for an updated outcome later, reopen this PA request from your dashboard.  If [employer] has not responded to your request within 24 hours, contact [employer] at [phone]. If you think there may be a problem with your PA request, use our live chat feature at the bottom right.

## 2023-04-12 ENCOUNTER — Encounter: Payer: Self-pay | Admitting: Internal Medicine

## 2023-04-13 ENCOUNTER — Telehealth: Payer: Self-pay | Admitting: Internal Medicine

## 2023-04-13 ENCOUNTER — Telehealth (INDEPENDENT_AMBULATORY_CARE_PROVIDER_SITE_OTHER): Payer: 59 | Admitting: Internal Medicine

## 2023-04-13 ENCOUNTER — Encounter: Payer: Self-pay | Admitting: Internal Medicine

## 2023-04-13 ENCOUNTER — Ambulatory Visit: Payer: Self-pay

## 2023-04-13 ENCOUNTER — Ambulatory Visit: Payer: Self-pay | Admitting: Internal Medicine

## 2023-04-13 DIAGNOSIS — J309 Allergic rhinitis, unspecified: Secondary | ICD-10-CM

## 2023-04-13 DIAGNOSIS — J3089 Other allergic rhinitis: Secondary | ICD-10-CM

## 2023-04-13 MED ORDER — BLOOD GLUCOSE TEST VI STRP
1.0000 | ORAL_STRIP | Freq: Three times a day (TID) | 3 refills | Status: AC
Start: 2023-04-13 — End: 2023-05-13

## 2023-04-13 MED ORDER — LANCETS MISC. MISC: 1.0000 | Freq: Three times a day (TID) | 3 refills | Status: DC

## 2023-04-13 MED ORDER — BLOOD GLUCOSE MONITORING SUPPL DEVI: 1.0000 | Freq: Three times a day (TID) | 3 refills | Status: AC

## 2023-04-13 MED ORDER — PREDNISONE 10 MG PO TABS: ORAL_TABLET | ORAL | 0 refills | Status: DC

## 2023-04-13 MED ORDER — LANCET DEVICE MISC
1.0000 | Freq: Three times a day (TID) | 3 refills | Status: AC
Start: 2023-04-13 — End: 2023-05-13

## 2023-04-13 NOTE — Telephone Encounter (Signed)
I called patient, patient verbalized understanding. 

## 2023-04-13 NOTE — Patient Instructions (Signed)
Allergic Rhinitis, Adult  Allergic rhinitis is a reaction to allergens. Allergens are things that can cause an allergic reaction. This condition affects the lining inside the nose (mucous membrane). There are two types of allergic rhinitis: Seasonal. This type is also called hay fever. It happens only during some times of the year. Perennial. This type can happen at any time of the year. This condition cannot be spread from person to person (is not contagious). It can be mild, bad, or very bad. It can develop at any age and may be outgrown. What are the causes? Pollen from grasses, trees, and weeds. Other causes can be: Dust mites. Smoke. Mold. Car fumes. The pee (urine), spit, or dander of pets. Dander is dead skin cells from a pet. What increases the risk? You are more likely to develop this condition if: You have allergies in your family. You have problems like allergies in your family. You may have: Swelling of parts of your eyes and eyelids. Asthma. This affects how you breathe. Long-term redness and swelling on your skin. Food allergies. What are the signs or symptoms? The main symptom of this condition is a runny or stuffy nose (nasal congestion). Other symptoms may include: Sneezing or coughing. Itching and tearing of your eyes. Mucus that drips down the back of your throat (postnasal drip). This may cause a sore throat. Trouble sleeping. Feeling tired. Headache. How is this treated? There is no cure for this condition. You should avoid things that you are allergic to. Treatment can help to relieve symptoms. This may include: Medicines that block allergy symptoms, such as anti-inflammatories or antihistamines. These may be given as a shot, nasal spray, or pill. Avoiding things you are allergic to. Medicines that give you some of what you are allergic to over time. This is called immunotherapy. It is done if other treatments do not help. You may get: Shots. Medicine under  your tongue. Stronger medicines, if other treatments do not help. Follow these instructions at home: Avoiding allergens Find out what things you are allergic to and avoid them. To do this, try these things: If you get allergies any time of year: Replace carpet with wood, tile, or vinyl flooring. Carpet can trap pet dander and dust. Do not smoke. Do not allow smoking in your home. Change your heating and air conditioning filters at least once a month. If you get allergies only some times of the year: Keep windows closed when you can. Plan things to do outside when pollen counts are lowest. Check pollen counts before you plan things to do outside. When you come indoors, change your clothes and shower before you sit on furniture or bedding. If you are allergic to a pet: Keep the pet out of your bedroom. Vacuum, sweep, and dust often. General instructions Take over-the-counter and prescription medicines only as told by your doctor. Drink enough fluid to keep your pee pale yellow. Where to find more information American Academy of Allergy, Asthma & Immunology: aaaai.org Contact a doctor if: You have a fever. You get a cough that does not go away. You make high-pitched whistling sounds when you breathe, most often when you breathe out (wheeze). Your symptoms slow you down. Your symptoms stop you from doing your normal things each day. Get help right away if: You are short of breath. This symptom may be an emergency. Get help right away. Call 911. Do not wait to see if the symptom will go away. Do not drive yourself to the   hospital. This information is not intended to replace advice given to you by your health care provider. Make sure you discuss any questions you have with your health care provider. Document Revised: 02/10/2022 Document Reviewed: 02/10/2022 Elsevier Patient Education  2024 Elsevier Inc.  

## 2023-04-13 NOTE — Telephone Encounter (Signed)
Ok to hold imuran until infection has completely resolved.

## 2023-04-13 NOTE — Telephone Encounter (Signed)
Changed appt to today

## 2023-04-13 NOTE — Telephone Encounter (Signed)
      Chief Complaint: Sinus pain, pressure. Face, eyes.Cough, ear congestion, scratchy throat. Chest tightness. States she in immunocompromised "and Rene Kocher usually works me in." Has VV tomorrow. Asking to be worked in today. Symptoms: Above Frequency: Friday Pertinent Negatives: Patient denies  Disposition: [] ED /[] Urgent Care (no appt availability in office) / [] Appointment(In office/virtual)/ []  [facility]/ [] Home Care/ [] Refused Recommended Disposition /[] [place]/ [x]  Follow-up with PCP Additional Notes: No answer on FC line. Please advise pt.  Reason for Disposition  Earache  Answer Assessment - Initial Assessment Questions 1. LOCATION: "Where does it hurt?"      Headache - front eyes 2. ONSET: "When did the sinus pain start?"  (e.g., hours, days)      Friday 3. SEVERITY: "How bad is the pain?"   (Scale 1-10; mild, moderate or severe)   - MILD (1-3): doesn't interfere with normal activities    - MODERATE (4-7): interferes with normal activities (e.g., work or school) or awakens from sleep   - SEVERE (8-10): excruciating pain and patient unable to do any normal activities        8 4. RECURRENT SYMPTOM: "Have you ever had sinus problems before?" If Yes, ask: "When was the last time?" and "What happened that time?"      Yes 5. NASAL CONGESTION: "Is the nose blocked?" If Yes, ask: "Can you open it or must you breathe through your mouth?"     No 6. NASAL DISCHARGE: "Do you have discharge from your nose?" If so ask, "What color?"     Clear 7. FEVER: "Do you have a fever?" If Yes, ask: "What is it, how was it measured, and when did it start?"      Feels hot 8. OTHER SYMPTOMS: "Do you have any other symptoms?" (e.g., sore throat, cough, earache, difficulty breathing)     Cough, chest tightness, scratchy throat 9. PREGNANCY: "Is there any chance you are pregnant?" "When was your last menstrual period?"     No  Protocols used: Sinus Pain or  Congestion-A-AH

## 2023-04-13 NOTE — Progress Notes (Signed)
Virtual Visit via Video Note  I connected with [name] on 04/13/23 at  1:40 PM EDT by a video enabled telemedicine application and verified that I am speaking with the correct person using two identifiers.  Location: Patient: Home Provider: Office  Person's participating in this video call: Nicki Reaper, NP and Kandis Fantasia   I discussed the limitations of evaluation and management by telemedicine and the availability of in person appointments. The patient expressed understanding and agreed to proceed.  History of Present Illness:  Pt reports headache, runny nose, nasal congestion, post nasal drip, hoarseness and cough. This started about 4 days ago. She is blowing clear mucous out of her nose. The cough is productive of yellow mucous. She denies ear pain, sore throat, shortness of breath, chest pain, nausea, vomiting or diarrhea she denies fever but has had chills and body aches. She has tried tylenol, saline nasal spray, alka seltzer cold and an antihistamine OTC with minimal relief of symptoms. She has had sick contacts with similar symptoms. She has had a negative home covid test.    Past Medical History:  Diagnosis Date   Allergy    Anemia    h/o   Anxiety    DDD (degenerative disc disease), lumbar    Depression    Fibromyalgia    GERD (gastroesophageal reflux disease)    Morbid obesity (HCC)    Pneumonia 01/2022   PONV (postoperative nausea and vomiting)    Pre-diabetes    Sjogren's syndrome (HCC)     Current Outpatient Medications  Medication Sig Dispense Refill   acetaminophen (TYLENOL 8 HOUR ARTHRITIS PAIN) 650 MG CR tablet Take 1,300 mg by mouth daily.     albuterol (VENTOLIN HFA) 108 (90 Base) MCG/ACT inhaler TAKE 2 PUFFS BY MOUTH EVERY 6 HOURS AS NEEDED FOR WHEEZE OR SHORTNESS OF BREATH 8.5 each 2   ALLEGRA-D ALLERGY & CONGESTION 180-240 MG 24 hr tablet TAKE 1 TABLET BY MOUTH EVERY DAY AS NEEDED (Patient taking differently: 1 tablet as needed.) 30  tablet 1   azaTHIOprine (IMURAN) 50 MG tablet Take 1 tablet (50 mg total) by mouth daily. 30 tablet 2   cyclobenzaprine (FLEXERIL) 10 MG tablet Take 10 mg by mouth at bedtime as needed.     diclofenac Sodium (VOLTAREN) 1 % GEL APPLY 2 GRAMS TO AFFECTED AREA 4 TIMES A DAY (Patient taking differently: 2 g as needed.) 100 g 0   eszopiclone (LUNESTA) 2 MG TABS tablet Take 1 tablet (2 mg total) by mouth at bedtime as needed for sleep. Take immediately before bedtime 30 tablet 0   famotidine (PEPCID) 20 MG tablet TAKE 1 TABLET (20 MG TOTAL) BY MOUTH 2 (TWO) TIMES DAILY AS NEEDED FOR HEARTBURN OR INDIGESTION. 180 tablet 0   gabapentin (NEURONTIN) 300 MG capsule Take 300 mg by mouth 2 (two) times daily.     hydrOXYzine (VISTARIL) 25 MG capsule Take 1 capsule (25 mg total) by mouth daily as needed. 90 capsule 1   meloxicam (MOBIC) 7.5 MG tablet Take 7.5-15 mg by mouth daily.     methocarbamol (ROBAXIN) 500 MG tablet Take 500 mg by mouth 3 (three) times daily as needed.     Multiple Vitamin (MULTIVITAMIN PO) Take by mouth daily.     pantoprazole (PROTONIX) 40 MG tablet Take 1 tablet (40 mg total) by mouth daily. 90 tablet 1   rosuvastatin (CRESTOR) 5 MG tablet Take 1 tablet (5 mg total) by mouth 3 (three) times a week. 45 tablet  1   VITAMIN D PO Take 1 tablet by mouth 2 (two) times a week.     No current facility-administered medications for this visit.    Allergies  Allergen Reactions   Hydrochlorothiazide Other (See Comments)    The pt state she couldn't function and felt like she was dying    Lipitor [Atorvastatin]     Muscle pain   Penicillins Other (See Comments)    Childhood allergy Has patient had a PCN reaction causing immediate rash, facial/tongue/throat swelling, SOB or lightheadedness with hypotension: Unknown Has patient had a PCN reaction causing severe rash involving mucus membranes or skin necrosis: Unknown Has patient had a PCN reaction that required hospitalization: No  Has  patient had a PCN reaction occurring within the last 10 years: No  If all of the above answers are "NO", then may proceed with Cephalosporin use.    Orange Juice [Orange Oil] Rash    rash    Family History  Problem Relation Age of Onset   Hyperlipidemia Mother    Rheum arthritis Sister    Osteoarthritis Sister    Fibroids Sister    Fibroids Sister    Fibroids Sister    Heart disease Maternal Aunt    Stroke Maternal Aunt    Diabetes Maternal Aunt    Heart disease Maternal Uncle    Stroke Maternal Uncle    Rheum arthritis Nephew    Cancer Neg Hx     Social History   Socioeconomic History   Marital status: Married    Spouse name: Not on file   Number of children: Not on file   Years of education: Not on file   Highest education level: Some college, no degree  Occupational History   Not on file  Tobacco Use   Smoking status: Never    Passive exposure: Current   Smokeless tobacco: Never  Vaping Use   Vaping status: Never Used  Substance and Sexual Activity   Alcohol use: Yes    Comment: occ   Drug use: Not Currently    Types: Marijuana   Sexual activity: Yes    Partners: Male    Birth control/protection: Post-menopausal, Surgical  Other Topics Concern   Not on file  Social History Narrative   Not on file   Social Determinants of Health   Financial Resource Strain: Medium Risk (04/02/2023)   Overall Financial Resource Strain (CARDIA)    Difficulty of Paying Living Expenses: Somewhat hard  Food Insecurity: Food Insecurity Present (04/02/2023)   Hunger Vital Sign    Worried About Running Out of Food in the Last Year: Sometimes true    Ran Out of Food in the Last Year: Sometimes true  Transportation Needs: No Transportation Needs (04/02/2023)   PRAPARE - Administrator, Civil Service (Medical): No    Lack of Transportation (Non-Medical): No  Physical Activity: Insufficiently Active (04/02/2023)   Exercise Vital Sign    Days of Exercise per Week: 3  days    Minutes of Exercise per Session: 20 min  Stress: Stress Concern Present (04/02/2023)   [employer] of Occupational Health - Occupational Stress Questionnaire    Feeling of Stress : To some extent  Social Connections: Moderately Integrated (04/02/2023)   Social Connection and Isolation Panel [NHANES]    Frequency of Communication with Friends and Family: More than three times a week    Frequency of Social Gatherings with Friends and Family: Three times a week    Attends Religious  Services: 1 to 4 times per year    Active Member of Clubs or Organizations: No    Attends Banker Meetings: Not on file    Marital Status: Married  Intimate Partner Violence: Not At Risk (06/03/2022)   Humiliation, Afraid, Rape, and Kick questionnaire    Fear of Current or Ex-Partner: No    Emotionally Abused: No    Physically Abused: No    Sexually Abused: No     Constitutional: Patient reports fatigue, headache.  Denies fever, malaise, or abrupt weight changes.  HEENT: Patient reports runny nose, nasal congestion and postnasal drip.  Denies eye pain, eye redness, ear pain, ringing in the ears, wax buildup, bloody nose, or sore throat. Respiratory: Patient reports cough.  Denies difficulty breathing, shortness of breath.   Cardiovascular: Denies chest pain, chest tightness, palpitations or swelling in the hands or feet.  Gastrointestinal: Denies abdominal pain, bloating, constipation, diarrhea or blood in the stool.   No other specific complaints in a complete review of systems (except as listed in HPI above).    Observations/Objective:  There were no vitals taken for this visit. Wt Readings from Last 3 Encounters:  04/02/23 271 lb (122.9 kg)  03/30/23 270 lb (122.5 kg)  02/03/23 274 lb 9.6 oz (124.6 kg)    General: Appears her stated age, appears unwell but in NAD. HEENT: Nose: Congestion noted; Throat/Mouth: hoarseness noted Pulmonary/Chest: Normal effort. No respiratory  distress.  Neurological: Alert and oriented.   BMET    Component Value Date/Time   NA 140 04/02/2023 1408   NA 142 09/18/2016 0929   K 4.2 04/02/2023 1408   CL 107 04/02/2023 1408   CO2 26 04/02/2023 1408   GLUCOSE 96 04/02/2023 1408   BUN 14 04/02/2023 1408   BUN 9 09/18/2016 0929   CREATININE 0.75 04/02/2023 1408   CALCIUM 9.8 04/02/2023 1408   GFRNONAA >60 01/15/2022 2334   GFRAA >60 12/01/2019 1558    Lipid Panel     Component Value Date/Time   CHOL 228 (H) 04/02/2023 1408   CHOL 200 (H) 03/23/2017 0844   TRIG 134 04/02/2023 1408   HDL 70 04/02/2023 1408   HDL 57 03/23/2017 0844   CHOLHDL 3.3 04/02/2023 1408   VLDL 36.2 03/19/2020 0918   LDLCALC 133 (H) 04/02/2023 1408    CBC    Component Value Date/Time   WBC 5.9 04/02/2023 1408   RBC 4.25 04/02/2023 1408   HGB 12.9 04/02/2023 1408   HGB 13.2 09/18/2016 0929   HCT 39.7 04/02/2023 1408   HCT 38.1 09/18/2016 0929   PLT 237 04/02/2023 1408   PLT 268 09/18/2016 0929   MCV 93.4 04/02/2023 1408   MCV 94 09/18/2016 0929   MCH 30.4 04/02/2023 1408   MCHC 32.5 04/02/2023 1408   RDW 13.2 04/02/2023 1408   RDW 12.8 09/18/2016 0929   LYMPHSABS 1,357 03/23/2023 1038   LYMPHSABS 2.8 08/03/2014 1609   MONOABS 0.6 08/30/2019 0315   EOSABS 80 03/23/2023 1038   EOSABS 0.1 08/03/2014 1609   BASOSABS 21 03/23/2023 1038   BASOSABS 0.0 08/03/2014 1609    Hgb A1C Lab Results  Component Value Date   HGBA1C 6.5 (H) 04/02/2023       Assessment and Plan: Allergic rhinitis:  Encouraged rest and fluids Can take Tylenol OTC and needed for fever or body aches Continue antihistamine and start Flonase OTC Rx for Pred taper for symptom management No indication for antibiotics at this time but would consider  doxycycline 100 mg twice daily x 10 days by Friday if symptoms persist or worsen  RTC in 3 months, followup chronic conditions  Follow Up Instructions:    I discussed the assessment and treatment plan with the  patient. The patient was provided an opportunity to ask questions and all were answered. The patient agreed with the plan and demonstrated an understanding of the instructions.   The patient was advised to call back or seek an in-person evaluation if the symptoms worsen or if the condition fails to improve as anticipated.   Nicki Reaper, NP

## 2023-04-13 NOTE — Telephone Encounter (Signed)
Glucose monitoring supplies sent to pharmacy.

## 2023-04-13 NOTE — Telephone Encounter (Signed)
Patient had virtual visit with PCP today, patient has sinus symptoms, should patient stop taking Imuran? No fever, chest cough, sinus drainage, head ache.

## 2023-04-14 ENCOUNTER — Telehealth: Payer: 59 | Admitting: Internal Medicine

## 2023-04-14 ENCOUNTER — Encounter: Payer: Self-pay | Admitting: Internal Medicine

## 2023-04-14 ENCOUNTER — Other Ambulatory Visit: Payer: Self-pay

## 2023-04-14 DIAGNOSIS — E1165 Type 2 diabetes mellitus with hyperglycemia: Secondary | ICD-10-CM

## 2023-04-14 MED ORDER — GLUCOSE BLOOD VI STRP: ORAL_STRIP | 12 refills | Status: AC

## 2023-04-14 MED ORDER — GLUCOSE BLOOD VI STRP: ORAL_STRIP | 12 refills | Status: DC

## 2023-04-29 ENCOUNTER — Encounter: Payer: Self-pay | Admitting: Internal Medicine

## 2023-05-04 ENCOUNTER — Ambulatory Visit (INDEPENDENT_AMBULATORY_CARE_PROVIDER_SITE_OTHER): Payer: 59 | Admitting: Pulmonary Disease

## 2023-05-04 DIAGNOSIS — J849 Interstitial pulmonary disease, unspecified: Secondary | ICD-10-CM

## 2023-05-04 LAB — PULMONARY FUNCTION TEST
DL/VA % pred: 117 %
DL/VA: 4.94 ml/min/mmHg/L
DLCO cor % pred: 99 %
DLCO cor: 21.09 ml/min/mmHg
DLCO unc % pred: 99 %
DLCO unc: 21.09 ml/min/mmHg
FEF 25-75 Post: 3.05 L/s
FEF 25-75 Pre: 2.4 L/s
FEF2575-%Change-Post: 26 %
FEF2575-%Pred-Post: 120 %
FEF2575-%Pred-Pre: 94 %
FEV1-%Change-Post: 5 %
FEV1-%Pred-Post: 94 %
FEV1-%Pred-Pre: 88 %
FEV1-Post: 2.57 L
FEV1-Pre: 2.43 L
FEV1FVC-%Change-Post: 4 %
FEV1FVC-%Pred-Pre: 102 %
FEV6-%Change-Post: 1 %
FEV6-%Pred-Post: 90 %
FEV6-%Pred-Pre: 89 %
FEV6-Post: 3.07 L
FEV6-Pre: 3.02 L
FEV6FVC-%Pred-Post: 103 %
FEV6FVC-%Pred-Pre: 103 %
FVC-%Change-Post: 1 %
FVC-%Pred-Post: 87 %
FVC-%Pred-Pre: 86 %
FVC-Post: 3.07 L
FVC-Pre: 3.02 L
Post FEV1/FVC ratio: 84 %
Post FEV6/FVC ratio: 100 %
Pre FEV1/FVC ratio: 80 %
Pre FEV6/FVC Ratio: 100 %
RV % pred: 93 %
RV: 1.84 L
TLC % pred: 89 %
TLC: 4.68 L

## 2023-05-04 NOTE — Patient Instructions (Signed)
Full PFT performed today. °

## 2023-05-04 NOTE — Progress Notes (Signed)
Full PFT performed today. °

## 2023-05-06 ENCOUNTER — Ambulatory Visit: Payer: 59 | Admitting: Pulmonary Disease

## 2023-05-06 ENCOUNTER — Encounter: Payer: Self-pay | Admitting: Pulmonary Disease

## 2023-05-06 VITALS — BP 110/80 | HR 94 | Temp 98.2°F | Ht 65.0 in | Wt 260.2 lb

## 2023-05-06 DIAGNOSIS — R0600 Dyspnea, unspecified: Secondary | ICD-10-CM

## 2023-05-06 DIAGNOSIS — R0689 Other abnormalities of breathing: Secondary | ICD-10-CM

## 2023-05-06 DIAGNOSIS — M3500 Sicca syndrome, unspecified: Secondary | ICD-10-CM | POA: Diagnosis not present

## 2023-05-06 NOTE — Patient Instructions (Signed)
VISIT SUMMARY:  During your follow-up visit, we discussed your history of Sjogren's syndrome and the recent CT scan of your lungs. You have been feeling better with improved diet, exercise, and weight loss, which has helped with your mobility and shortness of breath. The CT scan showed very subtle changes, but nothing definitive for interstitial lung disease. We also reviewed your experience with the [facility] inhaler, which did not provide significant benefit.  YOUR PLAN:  -SJOGREN'S SYNDROME WITH POSSIBLE INTERSTITIAL LUNG DISEASE: Sjogren's syndrome is an autoimmune disease that can cause dryness and other symptoms, and it may lead to interstitial lung disease, which affects the lung tissue. Your recent CT scan showed very subtle changes, but nothing definitive for interstitial lung disease. Your lung function tests are normal, and you have reported improvement in symptoms with lifestyle changes. We will continue with your current management and lifestyle modifications, and repeat the CT scan in 1 year to monitor for any progression.  -SHORTNESS OF BREATH/WHEEZING: Your shortness of breath and wheezing have improved with lifestyle changes such as better diet, exercise, and weight loss. The Breztri inhaler did not provide significant benefit, and your lung function tests are normal. We will discontinue the Breztri inhaler and continue with your current lifestyle modifications.  INSTRUCTIONS:  Please follow up in 6 months or sooner if you notice any changes in your symptoms.

## 2023-05-06 NOTE — Progress Notes (Signed)
[name]    [id number]    15-Jan-1966  Primary Care Physician:Baity, Salvadore Oxford, NP  Referring Physician: Lorre Munroe, NP [address] [place],  [place] [address]  Chief complaint: Follow up for interstitial lung disease  HPI: 57 y.o. who  has a past medical history of Allergy, Anemia, Anxiety, DDD (degenerative disc disease), lumbar, Depression, Fibromyalgia, GERD (gastroesophageal reflux disease), Morbid obesity (HCC), Pneumonia (01/2022), PONV (postoperative nausea and vomiting), Pre-diabetes, and Sjogren's syndrome (HCC).   She has history of Sjogren's syndrome, osteoarthritis.  Sjogren's syndrome was diagnosed in July 2023.  She was on methotrexate for a few months and then stopped in December 2023-is going to a lot of other medical issues at that time Referred to pulmonary for evaluation of dyspnea, wheezing and concern for interstitial lung disease.  She states she has episodes of seeing with cough, clear mucus that has been going on for several months.  Symptoms occur 3-4 times per week.  Pets: Dogs Occupation: Used to work in [employer] office.  Currently on disability Exposures: She had a mold exposure at work in 2022.  There are no ongoing exposures.  No hot tubs, Jacuzzis, feather pillows ILD questionnaire 12/24/2022-negative Smoking history: Never smoker Travel history: Originally from [place].  No significant travel history Relevant family history: No family history of lung disease  Interim History: Discussed the use of AI scribe software for clinical note transcription with the patient, who gave verbal consent to proceed.  The patient, with a history of Sjogren's syndrome and suspected interstitial lung disease, presents for a follow-up visit. She reports feeling 'sick and hurting' in the past but has been making efforts to improve her health. She has been eating better, exercising, and has lost sixteen pounds. She reports increased mobility and  less shortness of breath.  She had a recent CT scan of the lungs due to her history of Sjogren's syndrome, which can cause interstitial lung disease. The scan showed very subtle changes, but the patient and her doctor are not overly concerned at this point.  The patient was given a sample of the Breztri inhaler to see if it would be beneficial. However, she reports not noticing much difference when using it. She also reports less wheezing since starting her new health regimen.   Outpatient Encounter Medications as of 05/06/2023  Medication Sig   acetaminophen (TYLENOL 8 HOUR ARTHRITIS PAIN) 650 MG CR tablet Take 1,300 mg by mouth daily.   albuterol (VENTOLIN HFA) 108 (90 Base) MCG/ACT inhaler TAKE 2 PUFFS BY MOUTH EVERY 6 HOURS AS NEEDED FOR WHEEZE OR SHORTNESS OF BREATH   azaTHIOprine (IMURAN) 50 MG tablet Take 1 tablet (50 mg total) by mouth daily.   Blood Glucose Monitoring Suppl DEVI 1 each by Does not apply route in the morning, at noon, and at bedtime. May substitute to any manufacturer covered by patient's insurance.   eszopiclone (LUNESTA) 2 MG TABS tablet Take 1 tablet (2 mg total) by mouth at bedtime as needed for sleep. Take immediately before bedtime   famotidine (PEPCID) 20 MG tablet TAKE 1 TABLET (20 MG TOTAL) BY MOUTH 2 (TWO) TIMES DAILY AS NEEDED FOR HEARTBURN OR INDIGESTION.   Glucose Blood (BLOOD GLUCOSE TEST STRIPS) STRP 1 each by In Vitro route in the morning, at noon, and at bedtime. May substitute to any manufacturer covered by patient's insurance.   glucose blood test strip Use 1-3 times daily as needed DX E11.65  hydrOXYzine (VISTARIL) 25 MG capsule Take 1 capsule (25 mg total) by mouth daily as needed.   Lancet Device MISC 1 each by Does not apply route in the morning, at noon, and at bedtime. May substitute to any manufacturer covered by patient's insurance.   meloxicam (MOBIC) 7.5 MG tablet Take 7.5-15 mg by mouth daily.   methocarbamol (ROBAXIN) 500 MG tablet Take 500  mg by mouth 3 (three) times daily as needed.   Multiple Vitamin (MULTIVITAMIN PO) Take by mouth daily.   pantoprazole (PROTONIX) 40 MG tablet Take 1 tablet (40 mg total) by mouth daily.   rosuvastatin (CRESTOR) 5 MG tablet Take 1 tablet (5 mg total) by mouth 3 (three) times a week.   VITAMIN D PO Take 1 tablet by mouth 2 (two) times a week.   [DISCONTINUED] ALLEGRA-D ALLERGY & CONGESTION 180-240 MG 24 hr tablet TAKE 1 TABLET BY MOUTH EVERY DAY AS NEEDED (Patient not taking: Reported on 05/06/2023)   [DISCONTINUED] cyclobenzaprine (FLEXERIL) 10 MG tablet Take 10 mg by mouth at bedtime as needed. (Patient not taking: Reported on 05/06/2023)   [DISCONTINUED] diclofenac Sodium (VOLTAREN) 1 % GEL APPLY 2 GRAMS TO AFFECTED AREA 4 TIMES A DAY (Patient not taking: Reported on 05/06/2023)   [DISCONTINUED] gabapentin (NEURONTIN) 300 MG capsule Take 300 mg by mouth 2 (two) times daily. (Patient not taking: Reported on 05/06/2023)   [DISCONTINUED] Lancets Misc. MISC 1 each by Does not apply route in the morning, at noon, and at bedtime. May substitute to any manufacturer covered by patient's insurance. (Patient not taking: Reported on 05/06/2023)   [DISCONTINUED] predniSONE (DELTASONE) 10 MG tablet Take 6 tabs on day 1, 5 tabs on day 2, 4 tabs on day 3, 3 tabs on day 4, 2 tabs on day 5, 1 tab on day 6   No facility-administered encounter medications on file as of 05/06/2023.    Physical Exam: Blood pressure 110/80, pulse 94, temperature 98.2 F (36.8 C), temperature source Oral, height 5\' 5"  (1.651 m), weight 260 lb 3.2 oz (118 kg), SpO2 97%. Gen:      No acute distress HEENT:  EOMI, sclera anicteric Neck:     No masses; no thyromegaly Lungs:    Clear to auscultation bilaterally; normal respiratory effort CV:         Regular rate and rhythm; no murmurs Abd:      + bowel sounds; soft, non-tender; no palpable masses, no distension Ext:    No edema; adequate peripheral perfusion Skin:      Warm and dry; no  rash Neuro: alert and oriented x 3 Psych: normal mood and affect   Data Reviewed: Imaging: Chest x-ray 02/27/2022-no active cardiopulmonary disease  High resolution CT 12/30/2022-very subtle findings of mild groundglass attenuation, subcentimeter pulmonary nodules I had reviewed the images personally.  PFTs: 05/04/2023 FVC 3.07 [87%], FEV1 2.57 [94%], TLC 4.68 [89%], DLCO 21.09 [99%] Normal test  Labs: CBC 08/06/2022-WBC 4.3, eos 0.9%, absolute eosinophil count 39   Assessment:  Sjogren's Syndrome with possible Interstitial Lung Disease CT scan shows very subtle changes, not definitive for interstitial lung disease. Lung function tests are normal. Patient reports improvement in symptoms with lifestyle changes (diet, exercise, weight loss).  -Continue current management and lifestyle modifications. -Repeat CT scan in 1 year to monitor for any progression.  Shortness of Breath/Wheezing Improvement noted with lifestyle changes. Breztri inhaler trialed but did not provide significant benefit and lung function tests are normal. -Discontinue Breztri inhaler. -Continue lifestyle modifications.  Follow-up  in 6 months or sooner if any changes in symptoms.   Plan/Recommendations: Continue monitoring.  Follow-up in 6 months  Chilton Greathouse MD [facility] 05/06/2023, 2:39 PM  CC: Lorre Munroe, NP

## 2023-06-06 ENCOUNTER — Other Ambulatory Visit: Payer: Self-pay | Admitting: Physician Assistant

## 2023-06-08 NOTE — Telephone Encounter (Signed)
Last Fill: 02/13/2023  Labs: 04/02/2023 CBC/CMP WNL  Next Visit: 06/30/2023  Last Visit: 03/30/2023  DX: Sjogren's syndrome with keratoconjunctivitis sicca   Current Dose per office note 03/30/2023: Imuran 50 mg 1 tablet by mouth daily   Okay to refill Imuran?

## 2023-06-16 ENCOUNTER — Telehealth (INDEPENDENT_AMBULATORY_CARE_PROVIDER_SITE_OTHER): Payer: 59 | Admitting: Internal Medicine

## 2023-06-16 ENCOUNTER — Encounter: Payer: Self-pay | Admitting: Internal Medicine

## 2023-06-16 DIAGNOSIS — J019 Acute sinusitis, unspecified: Secondary | ICD-10-CM | POA: Diagnosis not present

## 2023-06-16 DIAGNOSIS — B9689 Other specified bacterial agents as the cause of diseases classified elsewhere: Secondary | ICD-10-CM

## 2023-06-16 MED ORDER — DOXYCYCLINE HYCLATE 100 MG PO TABS
100.0000 mg | ORAL_TABLET | Freq: Two times a day (BID) | ORAL | 0 refills | Status: DC
Start: 2023-06-16 — End: 2023-09-22

## 2023-06-16 NOTE — Telephone Encounter (Signed)
Pt added on to schedule

## 2023-06-16 NOTE — Progress Notes (Signed)
 Virtual Visit via Video Note  I connected with [name] on 06/16/23 at 10:00 AM EST by a video enabled telemedicine application and verified that I am speaking with the correct person using two identifiers.  Location: Patient: Home Provider: Office  Person's participating in this video call: Angeline Laura, NP and [name]   I discussed the limitations of evaluation and management by telemedicine and the availability of in person appointments. The patient expressed understanding and agreed to proceed.  History of Present Illness:  Pt reports headache, runny nose, nasal congestion, post nasal drip, sore throat, hoarseness and cough. This started about 1 week ago. She is blowing colored mucous out of her nose. The cough is productive of yellow mucous. She denies ear pain, shortness of breath, chest pain, nausea, vomiting or diarrhea. She denies fever but has had chills and body aches. She has tried tylenol , saline nasal spray, alka seltzer cold, Flonase  and an antihistamine OTC with minimal relief of symptoms. She has had sick contacts with similar symptoms.  She does have a history of allergies.   Past Medical History:  Diagnosis Date   Allergy    Anemia    h/o   Anxiety    DDD (degenerative disc disease), lumbar    Depression    Fibromyalgia    GERD (gastroesophageal reflux disease)    Morbid obesity (HCC)    Pneumonia 01/2022   PONV (postoperative nausea and vomiting)    Pre-diabetes    Sjogren's syndrome (HCC)     Current Outpatient Medications  Medication Sig Dispense Refill   acetaminophen  (TYLENOL  8 HOUR ARTHRITIS PAIN) 650 MG CR tablet Take 1,300 mg by mouth daily.     albuterol  (VENTOLIN  HFA) 108 (90 Base) MCG/ACT inhaler TAKE 2 PUFFS BY MOUTH EVERY 6 HOURS AS NEEDED FOR WHEEZE OR SHORTNESS OF BREATH 8.5 each 2   azaTHIOprine  (IMURAN ) 50 MG tablet TAKE 1 TABLET BY MOUTH EVERY DAY 30 tablet 2   Blood Glucose Monitoring Suppl DEVI 1 each by Does not apply  route in the morning, at noon, and at bedtime. May substitute to any manufacturer covered by patient's insurance. 1 each 3   eszopiclone  (LUNESTA ) 2 MG TABS tablet Take 1 tablet (2 mg total) by mouth at bedtime as needed for sleep. Take immediately before bedtime 30 tablet 0   famotidine  (PEPCID ) 20 MG tablet TAKE 1 TABLET (20 MG TOTAL) BY MOUTH 2 (TWO) TIMES DAILY AS NEEDED FOR HEARTBURN OR INDIGESTION. 180 tablet 0   glucose blood test strip Use 1-3 times daily as needed DX E11.65 100 each 12   hydrOXYzine  (VISTARIL ) 25 MG capsule Take 1 capsule (25 mg total) by mouth daily as needed. 90 capsule 1   meloxicam  (MOBIC ) 7.5 MG tablet Take 7.5-15 mg by mouth daily.     methocarbamol  (ROBAXIN ) 500 MG tablet Take 500 mg by mouth 3 (three) times daily as needed.     Multiple Vitamin (MULTIVITAMIN PO) Take by mouth daily.     pantoprazole  (PROTONIX ) 40 MG tablet Take 1 tablet (40 mg total) by mouth daily. 90 tablet 1   rosuvastatin  (CRESTOR ) 5 MG tablet Take 1 tablet (5 mg total) by mouth 3 (three) times a week. 45 tablet 1   VITAMIN D  PO Take 1 tablet by mouth 2 (two) times a week.     No current facility-administered medications for this visit.    Allergies  Allergen Reactions   Hydrochlorothiazide  Other (See Comments)    The pt state  she couldn't function and felt like she was dying    Lipitor [Atorvastatin ]     Muscle pain   Penicillins Other (See Comments)    Childhood allergy Has patient had a PCN reaction causing immediate rash, facial/tongue/throat swelling, SOB or lightheadedness with hypotension: Unknown Has patient had a PCN reaction causing severe rash involving mucus membranes or skin necrosis: Unknown Has patient had a PCN reaction that required hospitalization: No  Has patient had a PCN reaction occurring within the last 10 years: No  If all of the above answers are NO, then may proceed with Cephalosporin use.    Orange Juice [Orange Oil] Rash    rash    Family History   Problem Relation Age of Onset   Hyperlipidemia Mother    Rheum arthritis Sister    Osteoarthritis Sister    Fibroids Sister    Fibroids Sister    Fibroids Sister    Heart disease Maternal Aunt    Stroke Maternal Aunt    Diabetes Maternal Aunt    Heart disease Maternal Uncle    Stroke Maternal Uncle    Rheum arthritis Nephew    Cancer Neg Hx     Social History   Socioeconomic History   Marital status: Married    Spouse name: Not on file   Number of children: Not on file   Years of education: Not on file   Highest education level: Some college, no degree  Occupational History   Not on file  Tobacco Use   Smoking status: Never    Passive exposure: Current   Smokeless tobacco: Never  Vaping Use   Vaping status: Never Used  Substance and Sexual Activity   Alcohol use: Yes    Comment: occ   Drug use: Not Currently    Types: Marijuana   Sexual activity: Yes    Partners: Male    Birth control/protection: Post-menopausal, Surgical  Other Topics Concern   Not on file  Social History Narrative   Not on file   Social Drivers of Health   Financial Resource Strain: Medium Risk (04/02/2023)   Overall Financial Resource Strain (CARDIA)    Difficulty of Paying Living Expenses: Somewhat hard  Food Insecurity: Food Insecurity Present (04/02/2023)   Hunger Vital Sign    Worried About Running Out of Food in the Last Year: Sometimes true    Ran Out of Food in the Last Year: Sometimes true  Transportation Needs: No Transportation Needs (04/02/2023)   PRAPARE - Administrator, Civil Service (Medical): No    Lack of Transportation (Non-Medical): No  Physical Activity: Insufficiently Active (04/02/2023)   Exercise Vital Sign    Days of Exercise per Week: 3 days    Minutes of Exercise per Session: 20 min  Stress: Stress Concern Present (04/02/2023)   [employer] of Occupational Health - Occupational Stress Questionnaire    Feeling of Stress : To some extent   Social Connections: Moderately Integrated (04/02/2023)   Social Connection and Isolation Panel [NHANES]    Frequency of Communication with Friends and Family: More than three times a week    Frequency of Social Gatherings with Friends and Family: Three times a week    Attends Religious Services: 1 to 4 times per year    Active Member of Clubs or Organizations: No    Attends Banker Meetings: Not on file    Marital Status: Married  Intimate Partner Violence: Not At Risk (06/03/2022)   Humiliation, Afraid,  Rape, and Kick questionnaire    Fear of Current or Ex-Partner: No    Emotionally Abused: No    Physically Abused: No    Sexually Abused: No     Constitutional: Patient reports fatigue, headache.  Denies fever, malaise, or abrupt weight changes.  HEENT: Patient reports runny nose, nasal congestion, sore throat and postnasal drip.  Denies eye pain, eye redness, ear pain, ringing in the ears, wax buildup, bloody nose. Respiratory: Patient reports cough.  Denies difficulty breathing, shortness of breath.   Cardiovascular: Denies chest pain, chest tightness, palpitations or swelling in the hands or feet.  Gastrointestinal: Denies abdominal pain, bloating, constipation, diarrhea or blood in the stool.   No other specific complaints in a complete review of systems (except as listed in HPI above).    Observations/Objective:   Wt Readings from Last 3 Encounters:  05/06/23 260 lb 3.2 oz (118 kg)  04/02/23 271 lb (122.9 kg)  03/30/23 270 lb (122.5 kg)    General: Appears her stated age, appears unwell but in NAD. HEENT: Nose: Congestion noted; Throat/Mouth: hoarseness noted Pulmonary/Chest: Normal effort. No respiratory distress.  Neurological: Alert and oriented.   BMET    Component Value Date/Time   NA 140 04/02/2023 1408   NA 142 09/18/2016 0929   K 4.2 04/02/2023 1408   CL 107 04/02/2023 1408   CO2 26 04/02/2023 1408   GLUCOSE 96 04/02/2023 1408   BUN 14  04/02/2023 1408   BUN 9 09/18/2016 0929   CREATININE 0.75 04/02/2023 1408   CALCIUM  9.8 04/02/2023 1408   GFRNONAA >60 01/15/2022 2334   GFRAA >60 12/01/2019 1558    Lipid Panel     Component Value Date/Time   CHOL 228 (H) 04/02/2023 1408   CHOL 200 (H) 03/23/2017 0844   TRIG 134 04/02/2023 1408   HDL 70 04/02/2023 1408   HDL 57 03/23/2017 0844   CHOLHDL 3.3 04/02/2023 1408   VLDL 36.2 03/19/2020 0918   LDLCALC 133 (H) 04/02/2023 1408    CBC    Component Value Date/Time   WBC 5.9 04/02/2023 1408   RBC 4.25 04/02/2023 1408   HGB 12.9 04/02/2023 1408   HGB 13.2 09/18/2016 0929   HCT 39.7 04/02/2023 1408   HCT 38.1 09/18/2016 0929   PLT 237 04/02/2023 1408   PLT 268 09/18/2016 0929   MCV 93.4 04/02/2023 1408   MCV 94 09/18/2016 0929   MCH 30.4 04/02/2023 1408   MCHC 32.5 04/02/2023 1408   RDW 13.2 04/02/2023 1408   RDW 12.8 09/18/2016 0929   LYMPHSABS 1,357 03/23/2023 1038   LYMPHSABS 2.8 08/03/2014 1609   MONOABS 0.6 08/30/2019 0315   EOSABS 80 03/23/2023 1038   EOSABS 0.1 08/03/2014 1609   BASOSABS 21 03/23/2023 1038   BASOSABS 0.0 08/03/2014 1609    Hgb A1C Lab Results  Component Value Date   HGBA1C 6.5 (H) 04/02/2023       Assessment and Plan: Acute bacterial sinusitis  Encouraged rest and fluids Can take Tylenol  OTC and needed for fever or body aches Continue antihistamine and Flonase  Rx for doxycycline  1 mg twice daily x 10 days No need for steroids at this time  RTC in 1 months, followup chronic conditions  Follow Up Instructions:    I discussed the assessment and treatment plan with the patient. The patient was provided an opportunity to ask questions and all were answered. The patient agreed with the plan and demonstrated an understanding of the instructions.   The patient  was advised to call back or seek an in-person evaluation if the symptoms worsen or if the condition fails to improve as anticipated.   Angeline Laura, NP

## 2023-06-16 NOTE — Patient Instructions (Signed)

## 2023-06-16 NOTE — Progress Notes (Signed)
 Office Visit Note  Patient: [name]             Date of Birth: 1966-03-07           MRN: [id number]             PCP: Antonette Angeline ORN, NP Referring: Antonette Angeline ORN, NP Visit Date: 06/30/2023 Occupation: @GUAROCC @  Subjective:  Medication monitoring   History of Present Illness: [name] is a 57 y.o. female with history of Sjogren's syndrome and inflammatory arthritis.  Patient remains on Imuran  50 mg 1 tablet by mouth daily.  She has been tolerating Imuran  without any side effects.  Patient recently had sinusitis and was treated with doxycycline .  Patient states that she held Imuran  for 10 days while her symptoms resolved.  She started Imuran  back several days ago.  Patient reports that while off of Imuran  she noted some increased arthralgias and joint stiffness.  Overall she has noticed a 50% improvement in her symptoms since initiating Imuran .  Her mobility has improved and she has been able to increase her activity level.  Patient reports that she has lost 22 pounds intentionally since increasing her activity level and changing her diet.  Patient reports that she has been diagnosed with diabetes so she has made several dietary changes. Patient continues to have difficulty writing for prolonged periods of time due to pain in her hands.  She has been using arthritis compression gloves as needed.  She denies any joint swelling at this time. She continues to have chronic sicca symptoms.  She has been using lotions for dry mouth but continues to have significant throat dryness.  Activities of Daily Living:  Patient reports morning stiffness for 1 hour.  Patient Reports nocturnal pain.  Difficulty dressing/grooming: Denies Difficulty climbing stairs: Reports Difficulty getting out of chair: Reports Difficulty using hands for taps, buttons, cutlery, and/or writing: Reports  Review of Systems  Constitutional:  Positive for fatigue.  HENT:  Positive for mouth dryness.  Negative for mouth sores.   Eyes:  Positive for dryness.  Respiratory:  Negative for shortness of breath.   Cardiovascular:  Positive for palpitations. Negative for chest pain.  Gastrointestinal:  Positive for constipation and diarrhea. Negative for blood in stool.  Endocrine: Positive for increased urination.  Genitourinary:  Negative for involuntary urination.  Musculoskeletal:  Positive for joint pain, gait problem, joint pain, joint swelling, myalgias, muscle weakness, morning stiffness, muscle tenderness and myalgias.  Skin:  Negative for color change, rash, hair loss and sensitivity to sunlight.  Allergic/Immunologic: Negative for susceptible to infections.  Neurological:  Positive for headaches. Negative for dizziness.  Hematological:  Negative for swollen glands.  Psychiatric/Behavioral:  Positive for sleep disturbance. Negative for depressed mood. The patient is not nervous/anxious.     PMFS History:  Patient Active Problem List   Diagnosis Date Noted   Type 2 diabetes mellitus with hyperglycemia, without long-term current use of insulin (HCC) 04/10/2023   Primary osteoarthritis of both hands 10/31/2021   Fibromyalgia 10/24/2021   Sjogren's syndrome (HCC) 10/24/2021   DDD (degenerative disc disease), lumbar 10/14/2021   Insomnia 02/14/2021   Hyperlipidemia associated with type 2 diabetes mellitus (HCC) 12/31/2020   Frequent headaches 12/31/2020   Class 3 severe obesity due to excess calories with body mass index (BMI) of 45.0 to 49.9 in adult (HCC) 12/31/2020   Gastroesophageal reflux disease without esophagitis 11/15/2018   Anxiety and depression 08/03/2014    Past Medical History:  Diagnosis Date  Allergy    Anemia    h/o   Anxiety    DDD (degenerative disc disease), lumbar    Depression    Diabetes mellitus, type 2 (HCC)    Fibromyalgia    GERD (gastroesophageal reflux disease)    Morbid obesity (HCC)    Pneumonia 01/2022   PONV (postoperative nausea and  vomiting)    Pre-diabetes    Sjogren's syndrome (HCC)     Family History  Problem Relation Age of Onset   Hyperlipidemia Mother    Rheum arthritis Sister    Osteoarthritis Sister    Fibroids Sister    Fibroids Sister    Fibroids Sister    Heart disease Maternal Aunt    Stroke Maternal Aunt    Diabetes Maternal Aunt    Heart disease Maternal Uncle    Stroke Maternal Uncle    Rheum arthritis Nephew    Cancer Neg Hx    Past Surgical History:  Procedure Laterality Date   ABDOMINAL HYSTERECTOMY  2008   LAVH, endometriosis ablation, mccall culdoplasty   CHOLECYSTECTOMY N/A 08/31/2019   Procedure: LAPAROSCOPIC CHOLECYSTECTOMY;  Surgeon: Desiderio Schanz, MD;  Location: [facility];  Service: General;  Laterality: N/A;   DIAGNOSTIC LAPAROSCOPY  2002   Hysteroscopy, diagnostic laparoscopy, extensive lysis of adhesions, endometrial ablation, RSO   RIGHT OOPHORECTOMY  2007   with right salpingectomy   TOTAL KNEE ARTHROPLASTY 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   Procedure: Partial KNEE ARTHROPLASTY;  Surgeon: Leora Lynwood SAUNDERS, MD;  Location: [facility];  Service: Orthopedics;  Laterality: Left;   TUBAL LIGATION     Social History   Social History Narrative   Not on file   Immunization History  Administered Date(s) Administered   Influenza, Seasonal, Injecte, Preservative Fre 04/02/2023   Influenza,inj,Quad PF,6+ Mos 03/17/2015, 03/17/2017, 02/25/2019, 03/19/2020, 03/28/2021   PFIZER(Purple Top)SARS-COV-2 Vaccination 07/11/2019, 08/01/2019   Tdap 02/09/2007, 09/18/2016     Objective: Vital Signs: BP 120/77 (BP Location: Left Arm, Patient Position: Sitting, Cuff Size: Large)   Pulse 81   Resp 14   Ht 5' 5 (1.651 m)   Wt 248 lb (112.5 kg)   BMI 41.27 kg/m    Physical Exam Vitals and nursing note reviewed.  Constitutional:      Appearance: She is well-developed.  HENT:     Head: Normocephalic and atraumatic.  Eyes:     Conjunctiva/sclera: Conjunctivae normal.  Cardiovascular:     Rate and  Rhythm: Normal rate and regular rhythm.     Heart sounds: Normal heart sounds.  Pulmonary:     Effort: Pulmonary effort is normal.     Breath sounds: Normal breath sounds.  Abdominal:     General: Bowel sounds are normal.     Palpations: Abdomen is soft.  Musculoskeletal:     Cervical back: Normal range of motion.  Lymphadenopathy:     Cervical: No cervical adenopathy.  Skin:    General: Skin is warm and dry.     Capillary Refill: Capillary refill takes less than 2 seconds.  Neurological:     Mental Status: She is alert and oriented to person, place, and time.  Psychiatric:        Behavior: Behavior normal.      Musculoskeletal Exam: C-spine has good ROM.  Limited mobility of the lumbar spine.  Shoulder joints, elbow joints, wrist joints, MCPs, PIPs, DIPs have good range of motion with no synovitis.  Complete fist formation bilaterally.  Hip joints have good range of motion with no groin pain.  Left knee replacement has good ROM with no effusion. Right knee has good ROM with no warmth or effusion. Ankle joints have good range of motion with no tenderness or joint swelling.  CDAI Exam: CDAI Score: -- Patient Global: --; Provider Global: -- Swollen: --; Tender: -- Joint Exam 06/30/2023   No joint exam has been documented for this visit   There is currently no information documented on the homunculus. Go to the Rheumatology activity and complete the homunculus joint exam.  Investigation: No additional findings.  Imaging: No results found.  Recent Labs: Lab Results  Component Value Date   WBC 5.9 04/02/2023   HGB 12.9 04/02/2023   PLT 237 04/02/2023   NA 140 04/02/2023   K 4.2 04/02/2023   CL 107 04/02/2023   CO2 26 04/02/2023   GLUCOSE 96 04/02/2023   BUN 14 04/02/2023   CREATININE 0.75 04/02/2023   BILITOT 0.5 04/02/2023   ALKPHOS 62 03/19/2020   AST 14 04/02/2023   ALT 13 04/02/2023   PROT 7.2 04/02/2023   ALBUMIN 3.9 03/19/2020   CALCIUM  9.8 04/02/2023    GFRAA >60 12/01/2019   QFTBGOLDPLUS NEGATIVE 04/17/2022    Speciality Comments: No specialty comments available.  Procedures:  No procedures performed Allergies: Hydrochlorothiazide , Lipitor [atorvastatin ], Penicillins, and Orange juice [orange oil]   Assessment / Plan:     Visit Diagnoses: Sjogren's syndrome with keratoconjunctivitis sicca (HCC) - Positive ANA, positive SSA, sicca symptoms, arthralgias: Patient has noticed about a 50% improvement in her joint pain and inflammation since initiating Imuran .  She is currently taking Imuran  50 mg 1 tablet by mouth daily.  No synovitis was noted on examination today. She continues to have chronic sicca symptoms.  She has been experiencing increased throat dryness and has been using lozenges as needed.  Discussed the use of over-the-counter products including XyliMelts, Biotene products, refresh, and Systane eyedrops.  Also discussed using an eye mask at night as well as humidifier in the bedroom.  She will notify us  if she develops any new or worsening symptoms.  The following lab work will be obtained tomorrow--Future orders were placed today.  Patient was advised to notify us  if she develops any new or worsening symptoms.  She remain on Imuran  as prescribed. - Plan: CBC with Differential/Platelet, COMPLETE METABOLIC PANEL WITH GFR, Sjogrens syndrome-A extractable nuclear antibody, Sjogrens syndrome-B extractable nuclear antibody, Rheumatoid factor, Serum protein electrophoresis with reflex, Urinalysis, Routine w reflex microscopic, ANA, C3 and C4, Sedimentation rate  Inflammatory arthritis - Anti-CCP and rheumatoid factor negative on 10/14/2021.  Ultrasound of both hands was positive for synovitis on 02/12/2022. Family history of rheumatoid arthritis: She has noticed a 50% improvement in her joint pain and inflammation since initiating Imuran .  She is taking Imuran  50 mg 1 tablet by mouth daily.  She has been tolerating Imuran  without any side effects.   Patient recently held Imuran  for 10 days while recovering from sinusitis and being treated with doxycycline .  While off of Imuran  she noticed increased arthralgias and joint stiffness which have since subsided since resuming Imuran .  No active inflammation was noted on examination today.  She will remain on the current dose of Imuran  as prescribed.  She is vies notify us  if she develops signs or symptoms of a flare.  She will follow-up in the office in 3 months or sooner if needed.  - Plan: Sedimentation rate  High risk medication use - Imuran  50 mg 1 tablet by mouth daily.Concern for early ILD and concern  for hepatorenal toxicity--not be a good candidate for methotrexate . CBC and CMP updated on 04/02/23. Orders for CBC and CMP released today.   Discussed the importance of holding imuran  if she develops signs or symptoms of an infection and to resume once the infection has completely cleared.   - Plan: CBC with Differential/Platelet, COMPLETE METABOLIC PANEL WITH GFR  Primary osteoarthritis of both hands: Patient continues to have difficulty writing for prolonged periods of time.  On examination today no synovitis was noted.  Discussed the importance of joint protection and muscle strengthening.  She has been using arthritis compression gloves which she has found to be helpful.  Paresthesia of both hands: Not currently symptomatic.   Trochanteric bursitis of both hips: Intermittent discomfort.   Status post left knee replacement - Partial-06/03/22 Performed by Dr. Christella.  Mobility has improved so she has been able to increase her activity level.   Primary osteoarthritis of right knee: No warmth or effusion noted.   Degeneration of intervertebral disc of lumbar region with discogenic back pain: Chronic pain.  According to the patient she may require surgical intervention.  Fibromyalgia: Patient continues to experience interval myalgias and muscle tenderness due to fibromyalgia.  She  takes methocarbamol  500 mg 3 times daily as needed for muscle spasms and tramadol  for pain relief.  Osteoporosis screening - DEXA updated on 07/29/22:  DualFemur Neck Left is 0.978 T-score of -0.4. Repeat DEXA in 2 years due to history of prednisone  use and recent right foot fracture.  History of diabetes mellitus: Hemoglobin A1c was 6.5% on 04/02/2023.  Patient has been making dietary changes and has lost 22 pounds.  Other medical conditions are listed as follows:  Anxiety and depression  Adjustment insomnia  Frequent headaches  Pure hypertriglyceridemia  Hypermobility of joint  Gastroesophageal reflux disease without esophagitis  Seasonal allergies  Family history of rheumatoid arthritis- two sisters and nephew   Orders: Orders Placed This Encounter  Procedures   CBC with Differential/Platelet   COMPLETE METABOLIC PANEL WITH GFR   Sjogrens syndrome-A extractable nuclear antibody   Sjogrens syndrome-B extractable nuclear antibody   Rheumatoid factor   Serum protein electrophoresis with reflex   Urinalysis, Routine w reflex microscopic   ANA   C3 and C4   Sedimentation rate   No orders of the defined types were placed in this encounter.    Follow-Up Instructions: Return in 3 months (on 09/28/2023) for Sjogren's syndrome.   Waddell CHRISTELLA Craze, PA-C  Note - This record has been created using Dragon software.  Chart creation errors have been sought, but may not always  have been located. Such creation errors do not reflect on  the standard of medical care.

## 2023-06-23 ENCOUNTER — Encounter: Payer: Self-pay | Admitting: Internal Medicine

## 2023-06-23 MED ORDER — DEXCOM G7 SENSOR MISC: 1.0000 | 1 refills | Status: DC

## 2023-06-30 ENCOUNTER — Encounter: Payer: Self-pay | Admitting: Physician Assistant

## 2023-06-30 ENCOUNTER — Ambulatory Visit: Payer: 59 | Attending: Physician Assistant | Admitting: Physician Assistant

## 2023-06-30 VITALS — BP 120/77 | HR 81 | Resp 14 | Ht 65.0 in | Wt 248.0 lb

## 2023-06-30 DIAGNOSIS — R202 Paresthesia of skin: Secondary | ICD-10-CM

## 2023-06-30 DIAGNOSIS — F419 Anxiety disorder, unspecified: Secondary | ICD-10-CM

## 2023-06-30 DIAGNOSIS — F5102 Adjustment insomnia: Secondary | ICD-10-CM

## 2023-06-30 DIAGNOSIS — E781 Pure hyperglyceridemia: Secondary | ICD-10-CM

## 2023-06-30 DIAGNOSIS — Z8261 Family history of arthritis: Secondary | ICD-10-CM

## 2023-06-30 DIAGNOSIS — M1990 Unspecified osteoarthritis, unspecified site: Secondary | ICD-10-CM

## 2023-06-30 DIAGNOSIS — K219 Gastro-esophageal reflux disease without esophagitis: Secondary | ICD-10-CM

## 2023-06-30 DIAGNOSIS — M797 Fibromyalgia: Secondary | ICD-10-CM

## 2023-06-30 DIAGNOSIS — R7303 Prediabetes: Secondary | ICD-10-CM

## 2023-06-30 DIAGNOSIS — M19042 Primary osteoarthritis, left hand: Secondary | ICD-10-CM

## 2023-06-30 DIAGNOSIS — M19041 Primary osteoarthritis, right hand: Secondary | ICD-10-CM | POA: Diagnosis not present

## 2023-06-30 DIAGNOSIS — M3501 Sicca syndrome with keratoconjunctivitis: Secondary | ICD-10-CM | POA: Diagnosis not present

## 2023-06-30 DIAGNOSIS — M5136 Other intervertebral disc degeneration, lumbar region with discogenic back pain only: Secondary | ICD-10-CM

## 2023-06-30 DIAGNOSIS — Z8639 Personal history of other endocrine, nutritional and metabolic disease: Secondary | ICD-10-CM

## 2023-06-30 DIAGNOSIS — M7062 Trochanteric bursitis, left hip: Secondary | ICD-10-CM

## 2023-06-30 DIAGNOSIS — F32A Depression, unspecified: Secondary | ICD-10-CM

## 2023-06-30 DIAGNOSIS — M1711 Unilateral primary osteoarthritis, right knee: Secondary | ICD-10-CM

## 2023-06-30 DIAGNOSIS — J302 Other seasonal allergic rhinitis: Secondary | ICD-10-CM

## 2023-06-30 DIAGNOSIS — Z13820 Encounter for screening for osteoporosis: Secondary | ICD-10-CM

## 2023-06-30 DIAGNOSIS — Z96652 Presence of left artificial knee joint: Secondary | ICD-10-CM

## 2023-06-30 DIAGNOSIS — R519 Headache, unspecified: Secondary | ICD-10-CM

## 2023-06-30 DIAGNOSIS — Z79899 Other long term (current) drug therapy: Secondary | ICD-10-CM | POA: Diagnosis not present

## 2023-06-30 DIAGNOSIS — M7061 Trochanteric bursitis, right hip: Secondary | ICD-10-CM

## 2023-06-30 DIAGNOSIS — M249 Joint derangement, unspecified: Secondary | ICD-10-CM

## 2023-07-02 ENCOUNTER — Telehealth: Payer: Self-pay | Admitting: Pulmonary Disease

## 2023-07-02 NOTE — Telephone Encounter (Signed)
Received a request from [facility] [employer] for medical records.  Faxed back to [facility], fax# [phone], with notes to send requests to [facility] HIM Dept.

## 2023-07-06 ENCOUNTER — Other Ambulatory Visit: Payer: Self-pay

## 2023-07-06 ENCOUNTER — Encounter: Payer: Self-pay | Admitting: Internal Medicine

## 2023-07-06 MED ORDER — LANCET DEVICE MISC: 1.0000 | Freq: Three times a day (TID) | 0 refills | Status: AC

## 2023-07-06 MED ORDER — BLOOD GLUCOSE TEST VI STRP: 1.0000 | ORAL_STRIP | Freq: Three times a day (TID) | 0 refills | Status: AC

## 2023-07-06 MED ORDER — BLOOD GLUCOSE MONITORING SUPPL DEVI: 1.0000 | Freq: Three times a day (TID) | 0 refills | Status: DC

## 2023-07-06 MED ORDER — LANCETS MISC. MISC: 1.0000 | Freq: Three times a day (TID) | 0 refills | Status: AC

## 2023-07-12 ENCOUNTER — Encounter: Payer: Self-pay | Admitting: Internal Medicine

## 2023-07-13 ENCOUNTER — Other Ambulatory Visit: Payer: Self-pay

## 2023-07-13 MED ORDER — HYDROXYZINE PAMOATE 25 MG PO CAPS: 25.0000 mg | ORAL_CAPSULE | Freq: Every day | ORAL | 0 refills | Status: DC | PRN

## 2023-07-13 MED ORDER — FEXOFENADINE-PSEUDOEPHED ER 180-240 MG PO TB24: 1.0000 | ORAL_TABLET | Freq: Every day | ORAL | 0 refills | Status: DC

## 2023-07-13 MED ORDER — FEXOFENADINE-PSEUDOEPHED ER 180-240 MG PO TB24: 1.0000 | ORAL_TABLET | Freq: Every day | ORAL | 0 refills | Status: AC

## 2023-07-14 ENCOUNTER — Telehealth: Payer: Self-pay | Admitting: Internal Medicine

## 2023-07-14 NOTE — Telephone Encounter (Signed)
Pt is calling in because her daughter has Norovirus and she has a 58 year old child. Pt says the child isn't sick but pt is immune compromised and she wants to know if it's okay for her to keep the baby while her mother is sick. Pt is requesting a call back on what she should do. Please advise.

## 2023-07-16 ENCOUNTER — Ambulatory Visit: Payer: 59 | Admitting: Podiatry

## 2023-07-21 ENCOUNTER — Ambulatory Visit (INDEPENDENT_AMBULATORY_CARE_PROVIDER_SITE_OTHER): Payer: 59 | Admitting: Podiatry

## 2023-07-21 ENCOUNTER — Encounter: Payer: Self-pay | Admitting: Podiatry

## 2023-07-21 VITALS — Ht 65.0 in | Wt 248.0 lb

## 2023-07-21 DIAGNOSIS — M2041 Other hammer toe(s) (acquired), right foot: Secondary | ICD-10-CM | POA: Diagnosis not present

## 2023-07-21 DIAGNOSIS — E1165 Type 2 diabetes mellitus with hyperglycemia: Secondary | ICD-10-CM | POA: Diagnosis not present

## 2023-07-21 DIAGNOSIS — M2042 Other hammer toe(s) (acquired), left foot: Secondary | ICD-10-CM | POA: Diagnosis not present

## 2023-07-21 DIAGNOSIS — E119 Type 2 diabetes mellitus without complications: Secondary | ICD-10-CM

## 2023-07-21 NOTE — Progress Notes (Signed)
 Subjective: [name] presents today referred by Antonette Angeline ORN, NP for diabetic foot evaluation.  Patient relates many year history of diabetes.  Patient denies any history of foot wounds.  Patient denies any history of numbness, tingling, burning, pins/needles sensations.  Past Medical History:  Diagnosis Date   Allergy    Anemia    h/o   Anxiety    DDD (degenerative disc disease), lumbar    Depression    Diabetes mellitus, type 2 (HCC)    Fibromyalgia    GERD (gastroesophageal reflux disease)    Morbid obesity (HCC)    Pneumonia 01/2022   PONV (postoperative nausea and vomiting)    Pre-diabetes    Sjogren's syndrome [facility])     Patient Active Problem List   Diagnosis Date Noted   Type 2 diabetes mellitus with hyperglycemia, without long-term current use of insulin (HCC) 04/10/2023   Primary osteoarthritis of both hands 10/31/2021   Fibromyalgia 10/24/2021   Sjogren's syndrome (HCC) 10/24/2021   DDD (degenerative disc disease), lumbar 10/14/2021   Insomnia 02/14/2021   Hyperlipidemia associated with type 2 diabetes mellitus (HCC) 12/31/2020   Frequent headaches 12/31/2020   Class 3 severe obesity due to excess calories with body mass index (BMI) of 45.0 to 49.9 in adult (HCC) 12/31/2020   Gastroesophageal reflux disease without esophagitis 11/15/2018   Anxiety and depression 08/03/2014    Past Surgical History:  Procedure Laterality Date   ABDOMINAL HYSTERECTOMY  2008   LAVH, endometriosis ablation, mccall culdoplasty   CHOLECYSTECTOMY N/A 08/31/2019   Procedure: LAPAROSCOPIC CHOLECYSTECTOMY;  Surgeon: Desiderio Schanz, MD;  Location: [facility] ORS;  Service: General;  Laterality: N/A;   DIAGNOSTIC LAPAROSCOPY  2002   Hysteroscopy, diagnostic laparoscopy, extensive lysis of adhesions, endometrial ablation, RSO   RIGHT OOPHORECTOMY  2007   with right salpingectomy   TOTAL KNEE ARTHROPLASTY 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   Procedure: Partial KNEE ARTHROPLASTY;  Surgeon: Leora Lynwood SAUNDERS, MD;  Location: [facility] ORS;  Service: Orthopedics;  Laterality: Left;   TUBAL LIGATION      Current Outpatient Medications on File Prior to Visit  Medication Sig Dispense Refill   acetaminophen  (TYLENOL  8 HOUR ARTHRITIS PAIN) 650 MG CR tablet Take 1,300 mg by mouth daily.     albuterol  (VENTOLIN  HFA) 108 (90 Base) MCG/ACT inhaler TAKE 2 PUFFS BY MOUTH EVERY 6 HOURS AS NEEDED FOR WHEEZE OR SHORTNESS OF BREATH 8.5 each 2   azaTHIOprine  (IMURAN ) 50 MG tablet TAKE 1 TABLET BY MOUTH EVERY DAY 30 tablet 2   Blood Glucose Monitoring Suppl DEVI 1 each by Does not apply route in the morning, at noon, and at bedtime. May substitute to any manufacturer covered by patient's insurance. 1 each 3   Blood Glucose Monitoring Suppl DEVI 1 each by Does not apply route in the morning, at noon, and at bedtime. May substitute to any manufacturer covered by patient's insurance. 1 each 0   Continuous Glucose Sensor (DEXCOM G7 SENSOR) MISC 1 Device by Does not apply route every 14 (fourteen) days. 6 each 1   doxycycline  (VIBRA -TABS) 100 MG tablet Take 1 tablet (100 mg total) by mouth 2 (two) times daily. 20 tablet 0   eszopiclone  (LUNESTA ) 2 MG TABS tablet Take 1 tablet (2 mg total) by mouth at bedtime as needed for sleep. Take immediately before bedtime 30 tablet 0   famotidine  (PEPCID ) 20 MG tablet TAKE 1 TABLET (20 MG TOTAL) BY MOUTH 2 (TWO) TIMES DAILY AS NEEDED FOR HEARTBURN OR INDIGESTION. 180 tablet 0  fexofenadine -pseudoephedrine (ALLEGRA-D 24) 180-240 MG 24 hr tablet Take 1 tablet by mouth daily. 180 tablet 0   fluticasone  (FLONASE  ALLERGY RELIEF) 50 MCG/ACT nasal spray      Glucose Blood (BLOOD GLUCOSE TEST STRIPS) STRP 1 each by In Vitro route in the morning, at noon, and at bedtime. May substitute to any manufacturer covered by patient's insurance. 100 strip 0   glucose blood test strip Use 1-3 times daily as needed DX E11.65 100 each 12   hydrOXYzine  (VISTARIL ) 25 MG capsule Take 1 capsule (25 mg  total) by mouth daily as needed. 90 capsule 0   Lancet Device MISC 1 each by Does not apply route in the morning, at noon, and at bedtime. May substitute to any manufacturer covered by patient's insurance. 1 each 0   Lancets Misc. MISC 1 each by Does not apply route in the morning, at noon, and at bedtime. May substitute to any manufacturer covered by patient's insurance. 100 each 0   meloxicam  (MOBIC ) 7.5 MG tablet Take 7.5-15 mg by mouth daily.     methocarbamol  (ROBAXIN ) 500 MG tablet Take 500 mg by mouth 3 (three) times daily as needed.     Multiple Vitamin (MULTIVITAMIN PO) Take by mouth daily.     pantoprazole  (PROTONIX ) 40 MG tablet Take 1 tablet (40 mg total) by mouth daily. 90 tablet 1   rosuvastatin  (CRESTOR ) 5 MG tablet Take 1 tablet (5 mg total) by mouth 3 (three) times a week. 45 tablet 1   traMADol  (ULTRAM ) 50 MG tablet Take 50 mg by mouth every 6 (six) hours as needed.     VITAMIN D  PO Take 1 tablet by mouth 2 (two) times a week.     No current facility-administered medications on file prior to visit.     Allergies  Allergen Reactions   Hydrochlorothiazide  Other (See Comments)    The pt state she couldn't function and felt like she was dying    Lipitor [Atorvastatin ]     Muscle pain   Penicillins Other (See Comments)    Childhood allergy Has patient had a PCN reaction causing immediate rash, facial/tongue/throat swelling, SOB or lightheadedness with hypotension: Unknown Has patient had a PCN reaction causing severe rash involving mucus membranes or skin necrosis: Unknown Has patient had a PCN reaction that required hospitalization: No  Has patient had a PCN reaction occurring within the last 10 years: No  If all of the above answers are NO, then may proceed with Cephalosporin use.    Orange Juice [Orange Oil] Rash    rash    Social History   Occupational History   Not on file  Tobacco Use   Smoking status: Never    Passive exposure: Current   Smokeless tobacco:  Never  Vaping Use   Vaping status: Never Used  Substance and Sexual Activity   Alcohol use: Yes    Comment: occ   Drug use: Not Currently    Types: Marijuana   Sexual activity: Yes    Partners: Male    Birth control/protection: Post-menopausal, Surgical    Family History  Problem Relation Age of Onset   Hyperlipidemia Mother    Rheum arthritis Sister    Osteoarthritis Sister    Fibroids Sister    Fibroids Sister    Fibroids Sister    Heart disease Maternal Aunt    Stroke Maternal Aunt    Diabetes Maternal Aunt    Heart disease Maternal Uncle    Stroke Maternal Uncle  Rheum arthritis Nephew    Cancer Neg Hx     Immunization History  Administered Date(s) Administered   Influenza, Seasonal, Injecte, Preservative Fre 04/02/2023   Influenza,inj,Quad PF,6+ Mos 03/17/2015, 03/17/2017, 02/25/2019, 03/19/2020, 03/28/2021   PFIZER(Purple Top)SARS-COV-2 Vaccination 07/11/2019, 08/01/2019   Tdap 02/09/2007, 09/18/2016    Review of systems: Positive Findings in bold print.  Constitutional:  chills, fatigue, fever, sweats, weight change Communication: nurse, learning disability, sign presenter, broadcasting, hand writing, iPad/Android device Head: headaches, head injury Eyes: changes in vision, eye pain, glaucoma, cataracts, macular degeneration, diplopia, glare,  light sensitivity, eyeglasses or contacts, blindness Ears nose mouth throat: hearing impaired, hearing aids,  ringing in ears, deaf, sign language,  vertigo, nosebleeds,  rhinitis,  cold sores, snoring, swollen glands Cardiovascular: HTN, edema, arrhythmia, pacemaker in place, defibrillator in place, chest pain/tightness, chronic anticoagulation, blood clot, heart failure, MI Peripheral Vascular: leg cramps, varicose veins, blood clots, lymphedema, varicosities Respiratory:  asthma, difficulty breathing, denies congestion, SOB, wheezing, cough, emphysema Gastrointestinal: change in appetite or weight, abdominal pain, constipation, diarrhea,  nausea, vomiting, vomiting blood, change in bowel habits, abdominal pain, jaundice, rectal bleeding, hemorrhoids, GERD Genitourinary:  nocturia,  pain on urination, polyuria,  blood in urine, Foley catheter, urinary urgency, ESRD on hemodialysis Musculoskeletal: amputation, cramping, stiff joints, painful joints, decreased joint motion, fractures, OA, gout, hemiplegia, paraplegia, uses cane, wheelchair bound, uses walker, uses rollator Skin: +changes in toenails, color change, dryness, itching, mole changes,  rash, wound(s) Neurological: headaches, numbness in feet, paresthesias in feet, burning in feet, fainting,  seizures, change in speech, migraines, memory problems/poor historian, cerebral palsy, weakness, paralysis, CVA, TIA Endocrine: diabetes, hypothyroidism, hyperthyroidism,  goiter, dry mouth, flushing, heat intolerance, cold intolerance,  excessive thirst, denies polyuria,  nocturia Hematological:  easy bleeding, excessive bleeding, easy bruising, enlarged lymph nodes, on long term blood thinner, history of past transusions Allergy/immunological:  hives, eczema, frequent infections, multiple drug allergies, seasonal allergies, transplant recipient, multiple food allergies Psychiatric:  anxiety, depression, mood disorder, suicidal ideations, hallucinations, insomnia  Objective: There were no vitals filed for this visit. Vascular Examination: Capillary refill time less than 3 seconds x 10 digits.  Dorsalis pedis pulses palpable 2 out of 4.  Posterior tibial pulses palpable 2 out of 4.  Digital hair not present x 10 digits.  Skin temperature gradient WNL b/l.  Dermatological Examination: Skin with normal turgor, texture and tone b/l  Toenails 1-5 b/l discolored, thick, dystrophic with subungual debris and pain with palpation to nailbeds due to thickness of nails.  Musculoskeletal: Muscle strength 5/5 to all LE muscle groups.  Hammertoe contracture noted 2 through 5 bilaterally  semiflexible in nature  Neurological: Sensation intact with 10 gram monofilament.  Vibratory sensation intact.  Assessment: NIDDM Encounter for diabetic foot examination Hammertoe bilateral  Plan: Discussed diabetic foot care principles. Literature dispensed on today. Patient to continue soft, supportive shoe gear daily. Patient to report any pedal injuries to medical professional immediately. Follow up one year. Patient/POA to call should there be a concern in the interim. Patient will benefit from diabetic shoes as patient has hammertoe contracture in the setting of diabetes as she is a high risk for developing ulceration she states understanding will obtain

## 2023-07-22 DIAGNOSIS — M545 Low back pain, unspecified: Secondary | ICD-10-CM | POA: Diagnosis not present

## 2023-07-22 DIAGNOSIS — Z96652 Presence of left artificial knee joint: Secondary | ICD-10-CM | POA: Diagnosis not present

## 2023-07-22 DIAGNOSIS — M5416 Radiculopathy, lumbar region: Secondary | ICD-10-CM | POA: Diagnosis not present

## 2023-07-28 ENCOUNTER — Ambulatory Visit: Payer: 59 | Admitting: Podiatry

## 2023-07-29 ENCOUNTER — Other Ambulatory Visit: Payer: Self-pay | Admitting: Internal Medicine

## 2023-07-29 ENCOUNTER — Encounter: Payer: Self-pay | Admitting: Internal Medicine

## 2023-07-30 NOTE — Telephone Encounter (Signed)
Pharmacy comment: Alternative Requested:PA REQUIRED.

## 2023-08-06 ENCOUNTER — Encounter: Payer: Self-pay | Admitting: Internal Medicine

## 2023-08-06 ENCOUNTER — Telehealth: Payer: Self-pay

## 2023-08-06 MED ORDER — PREDNISONE 5 MG PO TABS: ORAL_TABLET | ORAL | 0 refills | Status: DC

## 2023-08-06 MED ORDER — TRAMADOL HCL 50 MG PO TABS: 50.0000 mg | ORAL_TABLET | Freq: Four times a day (QID) | ORAL | 0 refills | Status: DC | PRN

## 2023-08-06 NOTE — Telephone Encounter (Signed)
 Okay to send prednisone taper starting at 20 mg and taper by 5 mg every 2 days.  Please advise prednisone can cause elevated blood glucose, obesity, hypertension, heart disease, cataracts, osteoporosis.  I do not see labs obtained in January as ordered.

## 2023-08-06 NOTE — Telephone Encounter (Signed)
 Copied from CRM [id number]. Topic: Clinical - Prescription Issue >> Aug 06, 2023  3:21 PM Payton Doughty wrote: Reason for CRM: Kell w/ pt's insurance called to follow up on request for the prior auth on her DexcomG7 sensor.  He says you can call: [phone] (this is prior auth team) for assistance OK to send info to her in [place]

## 2023-08-06 NOTE — Telephone Encounter (Signed)
 PA has been started

## 2023-08-06 NOTE — Progress Notes (Signed)
 PA started   [facility] Chaudhary (Key: [id number]) Need Help? Call us at [phone] Status New (Not sent to plan) Drug Dexcom G7 Sensor ePA cloud logo Form [facility] Electronic Prior Authorization Form [phone] NCPDP)

## 2023-08-13 ENCOUNTER — Encounter: Payer: Self-pay | Admitting: Internal Medicine

## 2023-09-17 NOTE — Progress Notes (Deleted)
 Office Visit Note  Patient: [name]             Date of Birth: 06-26-1965           MRN: [id number]             PCP: Lorre Munroe, NP Referring: Lorre Munroe, NP Visit Date: 10/01/2023 Occupation: @GUAROCC @  Subjective:  No chief complaint on file.   History of Present Illness: [name] is a 58 y.o. female ***     Activities of Daily Living:  Patient reports morning stiffness for *** {minute/hour:19697}.   Patient {ACTIONS;DENIES/REPORTS:21021675::"Denies"} nocturnal pain.  Difficulty dressing/grooming: {ACTIONS;DENIES/REPORTS:21021675::"Denies"} Difficulty climbing stairs: {ACTIONS;DENIES/REPORTS:21021675::"Denies"} Difficulty getting out of chair: {ACTIONS;DENIES/REPORTS:21021675::"Denies"} Difficulty using hands for taps, buttons, cutlery, and/or writing: {ACTIONS;DENIES/REPORTS:21021675::"Denies"}  No Rheumatology ROS completed.   PMFS History:  Patient Active Problem List   Diagnosis Date Noted   Type 2 diabetes mellitus with hyperglycemia, without long-term current use of insulin (HCC) 04/10/2023   Primary osteoarthritis of both hands 10/31/2021   Fibromyalgia 10/24/2021   Sjogren's syndrome (HCC) 10/24/2021   DDD (degenerative disc disease), lumbar 10/14/2021   Insomnia 02/14/2021   Hyperlipidemia associated with type 2 diabetes mellitus (HCC) 12/31/2020   Frequent headaches 12/31/2020   Class 3 severe obesity due to excess calories with body mass index (BMI) of 45.0 to 49.9 in adult (HCC) 12/31/2020   Gastroesophageal reflux disease without esophagitis 11/15/2018   Anxiety and depression 08/03/2014    Past Medical History:  Diagnosis Date   Allergy    Anemia    h/o   Anxiety    DDD (degenerative disc disease), lumbar    Depression    Diabetes mellitus, type 2 (HCC)    Fibromyalgia    GERD (gastroesophageal reflux disease)    Morbid obesity (HCC)    Pneumonia 01/2022   PONV (postoperative nausea and vomiting)     Pre-diabetes    Sjogren's syndrome (HCC)     Family History  Problem Relation Age of Onset   Hyperlipidemia Mother    Rheum arthritis Sister    Osteoarthritis Sister    Fibroids Sister    Fibroids Sister    Fibroids Sister    Heart disease Maternal Aunt    Stroke Maternal Aunt    Diabetes Maternal Aunt    Heart disease Maternal Uncle    Stroke Maternal Uncle    Rheum arthritis Nephew    Cancer Neg Hx    Past Surgical History:  Procedure Laterality Date   ABDOMINAL HYSTERECTOMY  2008   LAVH, endometriosis ablation, mccall culdoplasty   CHOLECYSTECTOMY N/A 08/31/2019   Procedure: LAPAROSCOPIC CHOLECYSTECTOMY;  Surgeon: Henrene Dodge, MD;  Location: [facility] ORS;  Service: General;  Laterality: N/A;   DIAGNOSTIC LAPAROSCOPY  2002   Hysteroscopy, diagnostic laparoscopy, extensive lysis of adhesions, endometrial ablation, RSO   RIGHT OOPHORECTOMY  2007   with right salpingectomy   TOTAL KNEE ARTHROPLASTY 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   Procedure: Partial KNEE ARTHROPLASTY;  Surgeon: Lyndle Herrlich, MD;  Location: [facility] ORS;  Service: Orthopedics;  Laterality: Left;   TUBAL LIGATION     Social History   Social History Narrative   Not on file   Immunization History  Administered Date(s) Administered   Influenza, Seasonal, Injecte, Preservative Fre 04/02/2023   Influenza,inj,Quad PF,6+ Mos 03/17/2015, 03/17/2017, 02/25/2019, 03/19/2020, 03/28/2021   PFIZER(Purple Top)SARS-COV-2 Vaccination 07/11/2019, 08/01/2019   Tdap 02/09/2007, 09/18/2016     Objective: Vital Signs: There were no vitals taken for this visit.  Physical Exam   Musculoskeletal Exam: ***  CDAI Exam: CDAI Score: -- Patient Global: --; Provider Global: -- Swollen: --; Tender: -- Joint Exam 10/01/2023   No joint exam has been documented for this visit   There is currently no information documented on the homunculus. Go to the Rheumatology activity and complete the homunculus joint exam.  Investigation: No additional  findings.  Imaging: No results found.  Recent Labs: Lab Results  Component Value Date   WBC 5.9 04/02/2023   HGB 12.9 04/02/2023   PLT 237 04/02/2023   NA 140 04/02/2023   K 4.2 04/02/2023   CL 107 04/02/2023   CO2 26 04/02/2023   GLUCOSE 96 04/02/2023   BUN 14 04/02/2023   CREATININE 0.75 04/02/2023   BILITOT 0.5 04/02/2023   ALKPHOS 62 03/19/2020   AST 14 04/02/2023   ALT 13 04/02/2023   PROT 7.2 04/02/2023   ALBUMIN 3.9 03/19/2020   CALCIUM 9.8 04/02/2023   GFRAA >60 12/01/2019   QFTBGOLDPLUS NEGATIVE 04/17/2022    Speciality Comments: No specialty comments available.  Procedures:  No procedures performed Allergies: Hydrochlorothiazide, Lipitor [atorvastatin], Penicillins, and Orange juice [orange oil]   Assessment / Plan:     Visit Diagnoses: Sjogren's syndrome with keratoconjunctivitis sicca (HCC)  Inflammatory arthritis  High risk medication use  Primary osteoarthritis of both hands  Paresthesia of both hands  Trochanteric bursitis of both hips  Status post left knee replacement  Primary osteoarthritis of right knee  Degeneration of intervertebral disc of lumbar region with discogenic back pain  Fibromyalgia  Anxiety and depression  Adjustment insomnia  Frequent headaches  Pure hypertriglyceridemia  Hypermobility of joint  Gastroesophageal reflux disease without esophagitis  History of diabetes mellitus  Seasonal allergies  Family history of rheumatoid arthritis- two sisters and nephew  Prediabetes  Orders: No orders of the defined types were placed in this encounter.  No orders of the defined types were placed in this encounter.   Face-to-face time spent with patient was *** minutes. Greater than 50% of time was spent in counseling and coordination of care.  Follow-Up Instructions: No follow-ups on file.   Gearldine Bienenstock, PA-C  Note - This record has been created using Dragon software.  Chart creation errors have been  sought, but may not always  have been located. Such creation errors do not reflect on  the standard of medical care.

## 2023-09-18 ENCOUNTER — Other Ambulatory Visit: Payer: 59

## 2023-09-21 ENCOUNTER — Ambulatory Visit: Admitting: Internal Medicine

## 2023-09-21 NOTE — Progress Notes (Deleted)
 Subjective:    Patient ID: [name], female    DOB: 12/12/1965, 58 y.o.   MRN: [id number]  HPI  Patient presents to clinic today for 82-month follow-up of chronic conditions.  GERD: Triggered by medication, stress.  She has occasional breakthrough on pantoprazole for which she takes pepto-bismol, tums and famotidine OTC as needed.  Upper GI from 03/2020 reviewed.  OA/fibromyalgia.  Managed with meloxicam, methocarbamol and tramadol.  She does not exercise.  She follows with orthopedics.  Anxiety and depression: Chronic, managed on hydroxyzine as needed. She has been on duloxetine in the past.  She is not currently seeing a therapist.  She denies SI/HI.  Frequent headaches: These occur every other day. Triggered by stress.  She takes tylenol as needed with some relief of symptoms.  HLD: Her last LDL was 133, triglycerides 134, 03/2023.  She is not taking rosuvastatin as prescribed..  She does not consume a low-fat diet.  Insomnia: She has difficulty falling and staying asleep.  She takes lunesta as needed with minimal relief of symptoms.  There is no sleep study on file.  DM2: Her last A1c was 6.5%, 03/2023.  She is not taking any oral diabetic medication at this time.  She does not check her sugars.  She checks her feet routinely.  Her last eye exam was.  Flu 03/2023.  COVID x 2.  Pneumovax never.  Sjogren's: She reports dry mouth and dry eyes.  She is taking imuran as prescribed.  She follows with rheumatology.  Interstitial lung disease: She reports intermittent wheezing but denies cough or shortness of breath.  She is taking albuterol as needed, has been prescribed breztri in the past.  PFTs from 12/2022 reviewed.  She follows with pulmonology.  Review of Systems     Past Medical History:  Diagnosis Date   Allergy    Anemia    h/o   Anxiety    DDD (degenerative disc disease), lumbar    Depression    Diabetes mellitus, type 2 (HCC)    Fibromyalgia    GERD  (gastroesophageal reflux disease)    Morbid obesity (HCC)    Pneumonia 01/2022   PONV (postoperative nausea and vomiting)    Pre-diabetes    Sjogren's syndrome (HCC)     Current Outpatient Medications  Medication Sig Dispense Refill   acetaminophen (TYLENOL 8 HOUR ARTHRITIS PAIN) 650 MG CR tablet Take 1,300 mg by mouth daily.     albuterol (VENTOLIN HFA) 108 (90 Base) MCG/ACT inhaler TAKE 2 PUFFS BY MOUTH EVERY 6 HOURS AS NEEDED FOR WHEEZE OR SHORTNESS OF BREATH 8.5 each 2   azaTHIOprine (IMURAN) 50 MG tablet TAKE 1 TABLET BY MOUTH EVERY DAY 30 tablet 2   Blood Glucose Monitoring Suppl DEVI 1 each by Does not apply route in the morning, at noon, and at bedtime. May substitute to any manufacturer covered by patient's insurance. 1 each 3   Blood Glucose Monitoring Suppl DEVI 1 each by Does not apply route in the morning, at noon, and at bedtime. May substitute to any manufacturer covered by patient's insurance. 1 each 0   Continuous Glucose Sensor (DEXCOM G7 SENSOR) MISC 1 Device by Does not apply route every 14 (fourteen) days. 6 each 1   doxycycline (VIBRA-TABS) 100 MG tablet Take 1 tablet (100 mg total) by mouth 2 (two) times daily. 20 tablet 0   eszopiclone (LUNESTA) 2 MG TABS tablet Take 1 tablet (2 mg total) by mouth at bedtime as needed for  sleep. Take immediately before bedtime 30 tablet 0   famotidine (PEPCID) 20 MG tablet TAKE 1 TABLET (20 MG TOTAL) BY MOUTH 2 (TWO) TIMES DAILY AS NEEDED FOR HEARTBURN OR INDIGESTION. 180 tablet 0   fexofenadine-pseudoephedrine (ALLEGRA-D 24) 180-240 MG 24 hr tablet Take 1 tablet by mouth daily. 180 tablet 0   fluticasone (FLONASE ALLERGY RELIEF) 50 MCG/ACT nasal spray      glucose blood test strip Use 1-3 times daily as needed DX E11.65 100 each 12   hydrOXYzine (VISTARIL) 25 MG capsule Take 1 capsule (25 mg total) by mouth daily as needed. 90 capsule 0   meloxicam (MOBIC) 7.5 MG tablet Take 7.5-15 mg by mouth daily.     methocarbamol (ROBAXIN) 500 MG  tablet Take 500 mg by mouth 3 (three) times daily as needed.     Multiple Vitamin (MULTIVITAMIN PO) Take by mouth daily.     pantoprazole (PROTONIX) 40 MG tablet Take 1 tablet (40 mg total) by mouth daily. 90 tablet 1   predniSONE (DELTASONE) 5 MG tablet Take 4 tabs po x 2 days, 3  tabs po x 2 days, 2 tabs po x 2 days, 1  tab po x 2 days. Take in the morning with breakfast. Do not take with NSAIDS. 20 tablet 0   rosuvastatin (CRESTOR) 5 MG tablet Take 1 tablet (5 mg total) by mouth 3 (three) times a week. 45 tablet 1   traMADol (ULTRAM) 50 MG tablet Take 1 tablet (50 mg total) by mouth every 6 (six) hours as needed. 30 tablet 0   VITAMIN D PO Take 1 tablet by mouth 2 (two) times a week.     No current facility-administered medications for this visit.    Allergies  Allergen Reactions   Hydrochlorothiazide Other (See Comments)    The pt state she couldn't function and felt like she was dying    Lipitor [Atorvastatin]     Muscle pain   Penicillins Other (See Comments)    Childhood allergy Has patient had a PCN reaction causing immediate rash, facial/tongue/throat swelling, SOB or lightheadedness with hypotension: Unknown Has patient had a PCN reaction causing severe rash involving mucus membranes or skin necrosis: Unknown Has patient had a PCN reaction that required hospitalization: No  Has patient had a PCN reaction occurring within the last 10 years: No  If all of the above answers are "NO", then may proceed with Cephalosporin use.    Orange Juice [Orange Oil] Rash    rash    Family History  Problem Relation Age of Onset   Hyperlipidemia Mother    Rheum arthritis Sister    Osteoarthritis Sister    Fibroids Sister    Fibroids Sister    Fibroids Sister    Heart disease Maternal Aunt    Stroke Maternal Aunt    Diabetes Maternal Aunt    Heart disease Maternal Uncle    Stroke Maternal Uncle    Rheum arthritis Nephew    Cancer Neg Hx     Social History   Socioeconomic History    Marital status: Married    Spouse name: Not on file   Number of children: Not on file   Years of education: Not on file   Highest education level: Some college, no degree  Occupational History   Not on file  Tobacco Use   Smoking status: Never    Passive exposure: Current   Smokeless tobacco: Never  Vaping Use   Vaping status: Never Used  Substance  and Sexual Activity   Alcohol use: Yes    Comment: occ   Drug use: Not Currently    Types: Marijuana   Sexual activity: Yes    Partners: Male    Birth control/protection: Post-menopausal, Surgical  Other Topics Concern   Not on file  Social History Narrative   Not on file   Social Drivers of Health   Financial Resource Strain: Medium Risk (04/02/2023)   Overall Financial Resource Strain (CARDIA)    Difficulty of Paying Living Expenses: Somewhat hard  Food Insecurity: Food Insecurity Present (04/02/2023)   Hunger Vital Sign    Worried About Running Out of Food in the Last Year: Sometimes true    Ran Out of Food in the Last Year: Sometimes true  Transportation Needs: No Transportation Needs (04/02/2023)   PRAPARE - Administrator, Civil Service (Medical): No    Lack of Transportation (Non-Medical): No  Physical Activity: Insufficiently Active (04/02/2023)   Exercise Vital Sign    Days of Exercise per Week: 3 days    Minutes of Exercise per Session: 20 min  Stress: Stress Concern Present (04/02/2023)   [employer] of Occupational Health - Occupational Stress Questionnaire    Feeling of Stress : To some extent  Social Connections: Moderately Integrated (04/02/2023)   Social Connection and Isolation Panel [NHANES]    Frequency of Communication with Friends and Family: More than three times a week    Frequency of Social Gatherings with Friends and Family: Three times a week    Attends Religious Services: 1 to 4 times per year    Active Member of Clubs or Organizations: No    Attends Banker  Meetings: Not on file    Marital Status: Married  Catering manager Violence: Not At Risk (06/03/2022)   Humiliation, Afraid, Rape, and Kick questionnaire    Fear of Current or Ex-Partner: No    Emotionally Abused: No    Physically Abused: No    Sexually Abused: No     Constitutional: Patient reports intermittent headaches, chronic fatigue.  Denies fever, malaise, or abrupt weight changes.  HEENT: Denies eye pain, eye redness, ear pain, ringing in the ears, wax buildup, runny nose, nasal congestion, bloody nose, or sore throat. Respiratory: Denies difficulty breathing, shortness of breath, cough or sputum production.   Cardiovascular: Denies chest pain, chest tightness, palpitations or swelling in the hands or feet.  Gastrointestinal: Patient reports intermittent reflux.  Denies abdominal pain, bloating, constipation, diarrhea or blood in the stool.  GU: Denies urgency, frequency, pain with urination, burning sensation, blood in urine, odor or discharge. Musculoskeletal: Patient reports chronic joint and muscle pain.  Denies decrease in range of motion, difficulty with gait, or joint swelling.  Skin: Denies redness, rashes, lesions or ulcercations.  Neurological: Patient reports insomnia.  Denies dizziness, difficulty with memory, difficulty with speech or problems with balance and coordination.  Psych: Patient has a history of anxiety and depression.  Denies SI/HI.  No other specific complaints in a complete review of systems (except as listed in HPI above).  Objective:   Physical Exam There were no vitals taken for this visit.  Wt Readings from Last 3 Encounters:  07/21/23 248 lb (112.5 kg)  06/30/23 248 lb (112.5 kg)  05/06/23 260 lb 3.2 oz (118 kg)    General: Appears her stated age, obese, in NAD. Skin: Warm, dry and intact.  HEENT: Head: normal shape and size; Eyes: sclera white, no icterus, conjunctiva pink, PERRLA and  EOMs intact;  Cardiovascular: Normal rate and rhythm.  S1,S2 noted.  No murmur, rubs or gallops noted. No JVD or BLE edema. No carotid bruits noted. Pulmonary/Chest: Normal effort and positive vesicular breath sounds. No respiratory distress. No wheezes, rales or ronchi noted.  Abdomen: Soft and nontender. Normal bowel sounds.  Musculoskeletal: Gait slow and steady without device. Neurological: Alert and oriented. Coordination normal.  Psychiatric: Mood and affect normal. Behavior is normal. Judgment and thought content normal.     BMET    Component Value Date/Time   NA 140 04/02/2023 1408   NA 142 09/18/2016 0929   K 4.2 04/02/2023 1408   CL 107 04/02/2023 1408   CO2 26 04/02/2023 1408   GLUCOSE 96 04/02/2023 1408   BUN 14 04/02/2023 1408   BUN 9 09/18/2016 0929   CREATININE 0.75 04/02/2023 1408   CALCIUM 9.8 04/02/2023 1408   GFRNONAA >60 01/15/2022 2334   GFRAA >60 12/01/2019 1558    Lipid Panel     Component Value Date/Time   CHOL 228 (H) 04/02/2023 1408   CHOL 200 (H) 03/23/2017 0844   TRIG 134 04/02/2023 1408   HDL 70 04/02/2023 1408   HDL 57 03/23/2017 0844   CHOLHDL 3.3 04/02/2023 1408   VLDL 36.2 03/19/2020 0918   LDLCALC 133 (H) 04/02/2023 1408    CBC    Component Value Date/Time   WBC 5.9 04/02/2023 1408   RBC 4.25 04/02/2023 1408   HGB 12.9 04/02/2023 1408   HGB 13.2 09/18/2016 0929   HCT 39.7 04/02/2023 1408   HCT 38.1 09/18/2016 0929   PLT 237 04/02/2023 1408   PLT 268 09/18/2016 0929   MCV 93.4 04/02/2023 1408   MCV 94 09/18/2016 0929   MCH 30.4 04/02/2023 1408   MCHC 32.5 04/02/2023 1408   RDW 13.2 04/02/2023 1408   RDW 12.8 09/18/2016 0929   LYMPHSABS 1,357 03/23/2023 1038   LYMPHSABS 2.8 08/03/2014 1609   MONOABS 0.6 08/30/2019 0315   EOSABS 80 03/23/2023 1038   EOSABS 0.1 08/03/2014 1609   BASOSABS 21 03/23/2023 1038   BASOSABS 0.0 08/03/2014 1609    Hgb A1C Lab Results  Component Value Date   HGBA1C 6.5 (H) 04/02/2023            Assessment & Plan:     RTC in 1 months for  your annual exam Nicki Reaper, NP

## 2023-09-22 ENCOUNTER — Ambulatory Visit: Admitting: Internal Medicine

## 2023-09-22 ENCOUNTER — Encounter: Payer: Self-pay | Admitting: Internal Medicine

## 2023-09-22 ENCOUNTER — Other Ambulatory Visit: Payer: Self-pay | Admitting: Internal Medicine

## 2023-09-22 VITALS — BP 120/70 | Ht 65.0 in | Wt 238.4 lb

## 2023-09-22 DIAGNOSIS — Z23 Encounter for immunization: Secondary | ICD-10-CM | POA: Diagnosis not present

## 2023-09-22 DIAGNOSIS — Z79899 Other long term (current) drug therapy: Secondary | ICD-10-CM

## 2023-09-22 DIAGNOSIS — M797 Fibromyalgia: Secondary | ICD-10-CM | POA: Diagnosis not present

## 2023-09-22 DIAGNOSIS — M3501 Sicca syndrome with keratoconjunctivitis: Secondary | ICD-10-CM

## 2023-09-22 DIAGNOSIS — M1990 Unspecified osteoarthritis, unspecified site: Secondary | ICD-10-CM | POA: Diagnosis not present

## 2023-09-22 DIAGNOSIS — E1169 Type 2 diabetes mellitus with other specified complication: Secondary | ICD-10-CM

## 2023-09-22 DIAGNOSIS — M5136 Other intervertebral disc degeneration, lumbar region with discogenic back pain only: Secondary | ICD-10-CM

## 2023-09-22 DIAGNOSIS — K6289 Other specified diseases of anus and rectum: Secondary | ICD-10-CM

## 2023-09-22 DIAGNOSIS — F32A Depression, unspecified: Secondary | ICD-10-CM

## 2023-09-22 DIAGNOSIS — F419 Anxiety disorder, unspecified: Secondary | ICD-10-CM

## 2023-09-22 DIAGNOSIS — K219 Gastro-esophageal reflux disease without esophagitis: Secondary | ICD-10-CM

## 2023-09-22 DIAGNOSIS — R519 Headache, unspecified: Secondary | ICD-10-CM

## 2023-09-22 DIAGNOSIS — M19041 Primary osteoarthritis, right hand: Secondary | ICD-10-CM

## 2023-09-22 DIAGNOSIS — K921 Melena: Secondary | ICD-10-CM

## 2023-09-22 DIAGNOSIS — E1165 Type 2 diabetes mellitus with hyperglycemia: Secondary | ICD-10-CM | POA: Diagnosis not present

## 2023-09-22 DIAGNOSIS — E785 Hyperlipidemia, unspecified: Secondary | ICD-10-CM | POA: Diagnosis not present

## 2023-09-22 DIAGNOSIS — F5102 Adjustment insomnia: Secondary | ICD-10-CM

## 2023-09-22 DIAGNOSIS — K644 Residual hemorrhoidal skin tags: Secondary | ICD-10-CM

## 2023-09-22 MED ORDER — TRAMADOL HCL 50 MG PO TABS: 50.0000 mg | ORAL_TABLET | Freq: Four times a day (QID) | ORAL | 0 refills | Status: DC | PRN

## 2023-09-22 MED ORDER — METHOCARBAMOL 500 MG PO TABS: 500.0000 mg | ORAL_TABLET | Freq: Three times a day (TID) | ORAL | 1 refills | Status: DC | PRN

## 2023-09-22 MED ORDER — HYDROCORT-PRAMOXINE (PERIANAL) 1-1 % EX FOAM: 1.0000 | Freq: Two times a day (BID) | CUTANEOUS | 0 refills | Status: DC

## 2023-09-22 NOTE — Assessment & Plan Note (Signed)
 C-Met and lipid profile today She is not taking rosuvastatin as prescribed Encouraged low fat diet

## 2023-09-22 NOTE — Assessment & Plan Note (Signed)
Continue meloxicam, methocarbamol and gabapentin Encouraged weight loss as this can help reduce joint pain

## 2023-09-22 NOTE — Assessment & Plan Note (Signed)
Continue imuran as prescribed by rheumatology

## 2023-09-22 NOTE — Assessment & Plan Note (Signed)
 Encouraged diet and exercise for weight loss ?

## 2023-09-22 NOTE — Assessment & Plan Note (Signed)
Try to avoid triggers Continue tylenol as needed

## 2023-09-22 NOTE — Assessment & Plan Note (Addendum)
 A1c and urine microalbumin today Encouraged low carb diet and exercise for weight loss She declines referral for diabetes education or nutrition Not medicated Encouraged routine eye exam Encouraged routine foot exam Pneumovax today

## 2023-09-22 NOTE — Assessment & Plan Note (Signed)
Continue meloxicam, methocarbamol and gabapentin per orthopedics and pain management Encouraged weight loss as this can help reduce joint pain

## 2023-09-22 NOTE — Assessment & Plan Note (Signed)
 Continue hydroxyzine and duloxetine as previously prescribed Support offered

## 2023-09-22 NOTE — Assessment & Plan Note (Signed)
Avoid things that trigger your reflux Encouraged weight loss as this can help reduce reflux Continue lansoprazole, tums and famotadine as needed

## 2023-09-22 NOTE — Progress Notes (Signed)
 Subjective:    Patient ID: [name], female    DOB: 1966/05/15, 58 y.o.   MRN: [id number]  HPI  Patient presents to clinic today for 83-month follow-up of chronic conditions.  GERD: Triggered by medication, stress.  She has occasional breakthrough on pantoprazole for which she takes pepto-bismol, tums and famotidine OTC as needed.  Upper GI from 03/2020 reviewed.  OA/fibromyalgia.  Managed with meloxicam, methocarbamol, tylenol and tramadol.  She does not exercise.  She follows with orthopedics.  Anxiety and depression: Chronic, managed on hydroxyzine as needed but recently started on duloxetine. She is not currently seeing a therapist.  She denies SI/HI.  Frequent headaches: These occur 1 x week. Triggered by stress.  She takes tylenol as needed with some relief of symptoms.  HLD: Her last LDL was 133, triglycerides 134, 03/2023.  She is not taking rosuvastatin as prescribed..  She does not consume a low-fat diet.  Insomnia: She has difficulty falling and staying asleep.  She takes lunesta as needed with minimal relief of symptoms.  There is no sleep study on file.  DM2: Her last A1c was 6.5%, 03/2023.  She is not taking any oral diabetic medication at this time.  Her sugars range 99-136.  She checks her feet routinely.  Her last eye exam was > 1 year ago.  Flu 03/2023.  COVID x 2.  Pneumovax never.  Sjogren's: She reports dry mouth and dry eyes.  She is taking imuran as prescribed.  She follows with rheumatology.  Interstitial lung disease: She reports intermittent wheezing but denies cough or shortness of breath.  She is taking albuterol as needed, has been prescribed breztri in the past.  PFTs from 12/2022 reviewed.  She follows with pulmonology.  Review of Systems     Past Medical History:  Diagnosis Date   Allergy    Anemia    h/o   Anxiety    DDD (degenerative disc disease), lumbar    Depression    Diabetes mellitus, type 2 (HCC)    Fibromyalgia    GERD  (gastroesophageal reflux disease)    Morbid obesity (HCC)    Pneumonia 01/2022   PONV (postoperative nausea and vomiting)    Pre-diabetes    Sjogren's syndrome (HCC)     Current Outpatient Medications  Medication Sig Dispense Refill   acetaminophen (TYLENOL 8 HOUR ARTHRITIS PAIN) 650 MG CR tablet Take 1,300 mg by mouth daily.     albuterol (VENTOLIN HFA) 108 (90 Base) MCG/ACT inhaler TAKE 2 PUFFS BY MOUTH EVERY 6 HOURS AS NEEDED FOR WHEEZE OR SHORTNESS OF BREATH 8.5 each 2   azaTHIOprine (IMURAN) 50 MG tablet TAKE 1 TABLET BY MOUTH EVERY DAY 30 tablet 2   Blood Glucose Monitoring Suppl DEVI 1 each by Does not apply route in the morning, at noon, and at bedtime. May substitute to any manufacturer covered by patient's insurance. 1 each 3   Blood Glucose Monitoring Suppl DEVI 1 each by Does not apply route in the morning, at noon, and at bedtime. May substitute to any manufacturer covered by patient's insurance. 1 each 0   Continuous Glucose Sensor (DEXCOM G7 SENSOR) MISC 1 Device by Does not apply route every 14 (fourteen) days. 6 each 1   doxycycline (VIBRA-TABS) 100 MG tablet Take 1 tablet (100 mg total) by mouth 2 (two) times daily. 20 tablet 0   eszopiclone (LUNESTA) 2 MG TABS tablet Take 1 tablet (2 mg total) by mouth at bedtime as needed for sleep.  Take immediately before bedtime 30 tablet 0   famotidine (PEPCID) 20 MG tablet TAKE 1 TABLET (20 MG TOTAL) BY MOUTH 2 (TWO) TIMES DAILY AS NEEDED FOR HEARTBURN OR INDIGESTION. 180 tablet 0   fexofenadine-pseudoephedrine (ALLEGRA-D 24) 180-240 MG 24 hr tablet Take 1 tablet by mouth daily. 180 tablet 0   fluticasone (FLONASE ALLERGY RELIEF) 50 MCG/ACT nasal spray      glucose blood test strip Use 1-3 times daily as needed DX E11.65 100 each 12   hydrOXYzine (VISTARIL) 25 MG capsule Take 1 capsule (25 mg total) by mouth daily as needed. 90 capsule 0   meloxicam (MOBIC) 7.5 MG tablet Take 7.5-15 mg by mouth daily.     methocarbamol (ROBAXIN) 500 MG  tablet Take 500 mg by mouth 3 (three) times daily as needed.     Multiple Vitamin (MULTIVITAMIN PO) Take by mouth daily.     pantoprazole (PROTONIX) 40 MG tablet Take 1 tablet (40 mg total) by mouth daily. 90 tablet 1   predniSONE (DELTASONE) 5 MG tablet Take 4 tabs po x 2 days, 3  tabs po x 2 days, 2 tabs po x 2 days, 1  tab po x 2 days. Take in the morning with breakfast. Do not take with NSAIDS. 20 tablet 0   rosuvastatin (CRESTOR) 5 MG tablet Take 1 tablet (5 mg total) by mouth 3 (three) times a week. 45 tablet 1   traMADol (ULTRAM) 50 MG tablet Take 1 tablet (50 mg total) by mouth every 6 (six) hours as needed. 30 tablet 0   VITAMIN D PO Take 1 tablet by mouth 2 (two) times a week.     No current facility-administered medications for this visit.    Allergies  Allergen Reactions   Hydrochlorothiazide Other (See Comments)    The pt state she couldn't function and felt like she was dying    Lipitor [Atorvastatin]     Muscle pain   Penicillins Other (See Comments)    Childhood allergy Has patient had a PCN reaction causing immediate rash, facial/tongue/throat swelling, SOB or lightheadedness with hypotension: Unknown Has patient had a PCN reaction causing severe rash involving mucus membranes or skin necrosis: Unknown Has patient had a PCN reaction that required hospitalization: No  Has patient had a PCN reaction occurring within the last 10 years: No  If all of the above answers are "NO", then may proceed with Cephalosporin use.    Orange Juice [Orange Oil] Rash    rash    Family History  Problem Relation Age of Onset   Hyperlipidemia Mother    Rheum arthritis Sister    Osteoarthritis Sister    Fibroids Sister    Fibroids Sister    Fibroids Sister    Heart disease Maternal Aunt    Stroke Maternal Aunt    Diabetes Maternal Aunt    Heart disease Maternal Uncle    Stroke Maternal Uncle    Rheum arthritis Nephew    Cancer Neg Hx     Social History   Socioeconomic History    Marital status: Married    Spouse name: Not on file   Number of children: Not on file   Years of education: Not on file   Highest education level: Some college, no degree  Occupational History   Not on file  Tobacco Use   Smoking status: Never    Passive exposure: Current   Smokeless tobacco: Never  Vaping Use   Vaping status: Never Used  Substance and  Sexual Activity   Alcohol use: Yes    Comment: occ   Drug use: Not Currently    Types: Marijuana   Sexual activity: Yes    Partners: Male    Birth control/protection: Post-menopausal, Surgical  Other Topics Concern   Not on file  Social History Narrative   Not on file   Social Drivers of Health   Financial Resource Strain: Medium Risk (04/02/2023)   Overall Financial Resource Strain (CARDIA)    Difficulty of Paying Living Expenses: Somewhat hard  Food Insecurity: Food Insecurity Present (04/02/2023)   Hunger Vital Sign    Worried About Running Out of Food in the Last Year: Sometimes true    Ran Out of Food in the Last Year: Sometimes true  Transportation Needs: No Transportation Needs (04/02/2023)   PRAPARE - Administrator, Civil Service (Medical): No    Lack of Transportation (Non-Medical): No  Physical Activity: Insufficiently Active (04/02/2023)   Exercise Vital Sign    Days of Exercise per Week: 3 days    Minutes of Exercise per Session: 20 min  Stress: Stress Concern Present (04/02/2023)   [employer] of Occupational Health - Occupational Stress Questionnaire    Feeling of Stress : To some extent  Social Connections: Moderately Integrated (04/02/2023)   Social Connection and Isolation Panel [NHANES]    Frequency of Communication with Friends and Family: More than three times a week    Frequency of Social Gatherings with Friends and Family: Three times a week    Attends Religious Services: 1 to 4 times per year    Active Member of Clubs or Organizations: No    Attends Banker  Meetings: Not on file    Marital Status: Married  Catering manager Violence: Not At Risk (06/03/2022)   Humiliation, Afraid, Rape, and Kick questionnaire    Fear of Current or Ex-Partner: No    Emotionally Abused: No    Physically Abused: No    Sexually Abused: No     Constitutional: Patient reports intermittent headaches, chronic fatigue.  Denies fever, malaise, or abrupt weight changes.  HEENT: Denies eye pain, eye redness, ear pain, ringing in the ears, wax buildup, runny nose, nasal congestion, bloody nose, or sore throat. Respiratory: Denies difficulty breathing, shortness of breath, cough or sputum production.   Cardiovascular: Denies chest pain, chest tightness, palpitations or swelling in the hands or feet.  Gastrointestinal: Patient reports intermittent reflux, rectal pain, blood in stool, alternating constipation and diarrhea.  Denies abdominal pain, bloating.  GU: Denies urgency, frequency, pain with urination, burning sensation, blood in urine, odor or discharge. Musculoskeletal: Patient reports chronic joint and muscle pain.  Denies decrease in range of motion, difficulty with gait, or joint swelling.  Skin: Denies redness, rashes, lesions or ulcercations.  Neurological: Patient reports insomnia.  Denies dizziness, difficulty with memory, difficulty with speech or problems with balance and coordination.  Psych: Patient has a history of anxiety and depression.  Denies SI/HI.  No other specific complaints in a complete review of systems (except as listed in HPI above).  Objective:   Physical Exam BP 120/70 (BP Location: Left Arm, Patient Position: Sitting, Cuff Size: Normal)   Ht 5\' 5"  (1.651 m)   Wt 238 lb 6.4 oz (108.1 kg)   BMI 39.67 kg/m    Wt Readings from Last 3 Encounters:  07/21/23 248 lb (112.5 kg)  06/30/23 248 lb (112.5 kg)  05/06/23 260 lb 3.2 oz (118 kg)    General:  Appears her stated age, obese, in NAD. Skin: Warm, dry and intact.  HEENT: Head: normal  shape and size; Eyes: sclera white, no icterus, conjunctiva pink, PERRLA and EOMs intact;  Cardiovascular: Normal rate and rhythm. S1,S2 noted.  No murmur, rubs or gallops noted. No JVD or BLE edema. No carotid bruits noted. Pulmonary/Chest: Normal effort and positive vesicular breath sounds. No respiratory distress. No wheezes, rales or ronchi noted.  Abdomen: Normal bowel sounds.  Rectal: External thrombosed ulcerated hemorrhoid at 8:00. Musculoskeletal: Gait slow and steady without device. Neurological: Alert and oriented. Coordination normal.  Psychiatric: Mood and affect normal. Behavior is normal. Judgment and thought content normal.     BMET    Component Value Date/Time   NA 140 04/02/2023 1408   NA 142 09/18/2016 0929   K 4.2 04/02/2023 1408   CL 107 04/02/2023 1408   CO2 26 04/02/2023 1408   GLUCOSE 96 04/02/2023 1408   BUN 14 04/02/2023 1408   BUN 9 09/18/2016 0929   CREATININE 0.75 04/02/2023 1408   CALCIUM 9.8 04/02/2023 1408   GFRNONAA >60 01/15/2022 2334   GFRAA >60 12/01/2019 1558    Lipid Panel     Component Value Date/Time   CHOL 228 (H) 04/02/2023 1408   CHOL 200 (H) 03/23/2017 0844   TRIG 134 04/02/2023 1408   HDL 70 04/02/2023 1408   HDL 57 03/23/2017 0844   CHOLHDL 3.3 04/02/2023 1408   VLDL 36.2 03/19/2020 0918   LDLCALC 133 (H) 04/02/2023 1408    CBC    Component Value Date/Time   WBC 5.9 04/02/2023 1408   RBC 4.25 04/02/2023 1408   HGB 12.9 04/02/2023 1408   HGB 13.2 09/18/2016 0929   HCT 39.7 04/02/2023 1408   HCT 38.1 09/18/2016 0929   PLT 237 04/02/2023 1408   PLT 268 09/18/2016 0929   MCV 93.4 04/02/2023 1408   MCV 94 09/18/2016 0929   MCH 30.4 04/02/2023 1408   MCHC 32.5 04/02/2023 1408   RDW 13.2 04/02/2023 1408   RDW 12.8 09/18/2016 0929   LYMPHSABS 1,357 03/23/2023 1038   LYMPHSABS 2.8 08/03/2014 1609   MONOABS 0.6 08/30/2019 0315   EOSABS 80 03/23/2023 1038   EOSABS 0.1 08/03/2014 1609   BASOSABS 21 03/23/2023 1038    BASOSABS 0.0 08/03/2014 1609    Hgb A1C Lab Results  Component Value Date   HGBA1C 6.5 (H) 04/02/2023            Assessment & Plan:   External hemorrhoid:  Rx for Proctofoam cream-use as directed Encouraged high-fiber diet and adequate water intake Okay to continue stool softeners OTC as needed  RTC in 1 months for your annual exam Nicki Reaper, NP

## 2023-09-22 NOTE — Assessment & Plan Note (Signed)
Continue lunesta.  

## 2023-09-22 NOTE — Patient Instructions (Signed)
 Hemorrhoids Hemorrhoids are swollen veins in and around the rectum or the opening of the butt (anus). There are two types of hemorrhoids: Internal. These occur in the veins just inside the rectum. They may poke through to the outside and become irritated and painful. External. These occur in the veins outside the anus. They can be felt as a painful swelling or hard lump near the anus. Most hemorrhoids do not cause severe problems. Often, they can be treated at home with diet and lifestyle changes. If home treatments do not help, you may need a procedure to shrink or remove the hemorrhoids. What are the causes? Hemorrhoids are caused by pressure near the anus. This pressure may be caused by: Constipation or diarrhea. Straining to poop. Pregnancy. Obesity. Sitting or riding a bike for a long time. Heavy lifting or other things that cause you to strain. Anal sex. What are the signs or symptoms? Symptoms of this condition include: Pain. Anal itching or irritation. Bleeding from the rectum. Leakage of poop (stool). Swelling of the anus. One or more lumps around the anus. How is this diagnosed? Hemorrhoids can often be diagnosed through a visual exam. Other exams or tests may also be done, such as: A digital rectal exam. This is when your health care provider feels inside your rectum with a gloved finger. Anoscope. This is an exam of the anus using a small tube. A blood test, if you have lost a lot of blood. A sigmoidoscopy or colonoscopy. These are tests to look inside the colon using a tube with a camera on the end. How is this treated? In most cases, hemorrhoids can be treated at home with diet and lifestyle changes. If these changes do not help, you may need to have a procedure done. These procedures can make the hemorrhoids smaller or fully remove them. Common procedures include: Rubber band ligation. Rubber bands are placed at the base of the hemorrhoids to cut off their blood  supply. Sclerotherapy. Medicine is put into the hemorrhoids to shrink them. Infrared coagulation. A type of light energy is used to get rid of the hemorrhoids. Hemorrhoidectomy surgery. The hemorrhoids are removed during surgery. Then, the veins that supply them are tied off. Stapled hemorrhoidopexy surgery. The base of the hemorrhoid is stapled to the wall of the rectum. Follow these instructions at home: Medicines Take over-the-counter and prescription medicines only as told by your provider. Use medicated creams or medicines that are put in the rectum (suppositories) as told by your provider. Eating and drinking  Eat foods that are high in fiber, such as beans, whole grains, and fresh fruits and vegetables. Ask your provider about taking products that have fiber added to them (fiber supplements). Reduce the amount of fat in your diet. You can do this by eating low-fat dairy products, eating less red meat, and avoiding processed foods. Drink enough fluid to keep your pee (urine) pale yellow. Managing pain and swelling  Take warm sitz baths for 20 minutes, 3-4 times a day. This can help ease pain and discomfort. You may do this in a bathtub or you can use a portable sitz bath that fits over the toilet. If told, put ice on the affected area. It may help to use ice packs between sitz baths. Put ice in a plastic bag. Place a towel between your skin and the bag. Leave the ice on for 20 minutes, 2-3 times a day. If your skin turns bright red, remove the ice right away to prevent  skin damage. The risk of damage is higher if you cannot feel pain, heat, or cold. General instructions Exercise. Ask your provider how much and what kind of exercise is best for you. In general, you should do moderate exercise for at least 30 minutes on most days of the week (150 minutes each week). You may want to try walking, biking, or yoga. Go to the bathroom when you have the urge to poop. Do not wait. Avoid  straining to poop. Keep the anus dry and clean. Use wet toilet paper or moist towelettes after you poop. Do not sit on the toilet for a long time. This can increase blood pooling and pain. Where to find more information [employer] of Diabetes and Digestive and Kidney Diseases: [email or web address] Contact a health care provider if: You have more pain and swelling that do not get better with treatment. You have trouble pooping or you are not able to poop. You have pain or inflammation outside the area of the hemorrhoids. Get help right away if: You are bleeding from your rectum and you cannot get it to stop. This information is not intended to replace advice given to you by your health care provider. Make sure you discuss any questions you have with your health care provider. Document Revised: 02/12/2022 Document Reviewed: 02/12/2022 Elsevier Patient Education  2024 [employer].

## 2023-09-22 NOTE — Assessment & Plan Note (Signed)
Continue meloxicam, methocarbamol and gabapentin She will continue to follow up with orthopedics

## 2023-09-23 ENCOUNTER — Encounter: Payer: Self-pay | Admitting: Internal Medicine

## 2023-09-23 ENCOUNTER — Other Ambulatory Visit: Payer: Self-pay

## 2023-09-23 MED ORDER — ROSUVASTATIN CALCIUM 5 MG PO TABS: 5.0000 mg | ORAL_TABLET | ORAL | 1 refills | Status: DC

## 2023-09-23 NOTE — Telephone Encounter (Signed)
 Requested medication (s) are due for refill today - no  Requested medication (s) are on the active medication list -yes  Future visit scheduled -no  Last refill: 09/22/23  Notes to clinic:   Pharmacy comment: Alternative Requested:PRIOR AUTH REQUIRED.    Requested Prescriptions  Pending Prescriptions Disp Refills   methocarbamol (ROBAXIN) 500 MG tablet [Pharmacy Med Name: METHOCARBAMOL 500 MG TABLET] 90 tablet 1    Sig: TAKE 1 TABLET BY MOUTH 3 TIMES DAILY AS NEEDED.     Not Delegated - Analgesics:  Muscle Relaxants Failed - 09/23/2023  2:30 PM      Failed - This refill cannot be delegated      Failed - Valid encounter within last 6 months    Recent Outpatient Visits           Yesterday Rectal pain   [facility] [place], Salvadore Oxford, NP       Future Appointments             In 1 week Amada [place] Lenice Llamas [facility] - A Dept Of [facility]               Requested Prescriptions  Pending Prescriptions Disp Refills   methocarbamol (ROBAXIN) 500 MG tablet [Pharmacy Med Name: METHOCARBAMOL 500 MG TABLET] 90 tablet 1    Sig: TAKE 1 TABLET BY MOUTH 3 TIMES DAILY AS NEEDED.     Not Delegated - Analgesics:  Muscle Relaxants Failed - 09/23/2023  2:30 PM      Failed - This refill cannot be delegated      Failed - Valid encounter within last 6 months    Recent Outpatient Visits           Yesterday Rectal pain   [facility] [place], Salvadore Oxford, NP       Future Appointments             In 1 week Amada [place] Lenice Llamas [facility] - A Dept Of [place]

## 2023-09-24 NOTE — Progress Notes (Signed)
 Reviewed results ordered by PCP-autoimmune results will be discussed in detail at her upcoming visit.

## 2023-09-25 ENCOUNTER — Encounter: Payer: Self-pay | Admitting: Internal Medicine

## 2023-09-25 LAB — MICROALBUMIN / CREATININE URINE RATIO
Creatinine, Urine: 172 mg/dL (ref 20–275)
Microalb Creat Ratio: 2 mg/g{creat} (ref <30)
Microalb, Ur: 0.4 mg/dL

## 2023-09-25 LAB — URINALYSIS, ROUTINE W REFLEX MICROSCOPIC
Bilirubin Urine: NEGATIVE
Glucose, UA: NEGATIVE
Hgb urine dipstick: NEGATIVE
Leukocytes,Ua: NEGATIVE
Nitrite: NEGATIVE
Protein, ur: NEGATIVE
Specific Gravity, Urine: 1.02 (ref 1.001–1.035)
pH: 6 (ref 5.0–8.0)

## 2023-09-25 LAB — PROTEIN / CREATININE RATIO, URINE
Creatinine, Urine: 172 mg/dL (ref 20–275)
Protein/Creat Ratio: 70 mg/g{creat} (ref 24–184)
Protein/Creatinine Ratio: 0.07 mg/mg{creat} (ref 0.024–0.184)
Total Protein, Urine: 12 mg/dL (ref 5–24)

## 2023-09-25 LAB — PROTEIN ELECTROPHORESIS, SERUM, WITH REFLEX
Albumin ELP: 4.4 g/dL (ref 3.8–4.8)
Alpha 1: 0.3 g/dL (ref 0.2–0.3)
Alpha 2: 0.7 g/dL (ref 0.5–0.9)
Beta 2: 0.5 g/dL (ref 0.2–0.5)
Beta Globulin: 0.4 g/dL (ref 0.4–0.6)
Gamma Globulin: 1.2 g/dL (ref 0.8–1.7)
Total Protein: 7.5 g/dL (ref 6.1–8.1)

## 2023-09-25 LAB — COMPREHENSIVE METABOLIC PANEL WITH GFR
AG Ratio: 1.7 (calc) (ref 1.0–2.5)
ALT: 15 U/L (ref 6–29)
AST: 15 U/L (ref 10–35)
Albumin: 4.7 g/dL (ref 3.6–5.1)
Alkaline phosphatase (APISO): 67 U/L (ref 37–153)
BUN: 10 mg/dL (ref 7–25)
CO2: 26 mmol/L (ref 20–32)
Calcium: 9.9 mg/dL (ref 8.6–10.4)
Chloride: 107 mmol/L (ref 98–110)
Creat: 0.6 mg/dL (ref 0.50–1.03)
Globulin: 2.8 g/dL (ref 1.9–3.7)
Glucose, Bld: 96 mg/dL (ref 65–99)
Potassium: 3.8 mmol/L (ref 3.5–5.3)
Sodium: 142 mmol/L (ref 135–146)
Total Bilirubin: 0.5 mg/dL (ref 0.2–1.2)
Total Protein: 7.5 g/dL (ref 6.1–8.1)
eGFR: 105 mL/min/{1.73_m2} (ref >=60)

## 2023-09-25 LAB — LIPID PANEL
Cholesterol: 228 mg/dL — ABNORMAL HIGH (ref <200)
HDL: 67 mg/dL (ref >=50)
LDL Cholesterol (Calc): 136 mg/dL — ABNORMAL HIGH
Non-HDL Cholesterol (Calc): 161 mg/dL — ABNORMAL HIGH (ref <130)
Total CHOL/HDL Ratio: 3.4 (calc) (ref <5.0)
Triglycerides: 125 mg/dL (ref <150)

## 2023-09-25 LAB — HEMOGLOBIN A1C
Hgb A1c MFr Bld: 6 %{Hb} — ABNORMAL HIGH (ref <5.7)
Mean Plasma Glucose: 126 mg/dL
eAG (mmol/L): 7 mmol/L

## 2023-09-25 LAB — ANTI-NUCLEAR AB-TITER (ANA TITER)
ANA TITER: 1:40 {titer} — ABNORMAL HIGH
ANA Titer 1: 1:320 {titer} — ABNORMAL HIGH

## 2023-09-25 LAB — CBC
HCT: 40.4 % (ref 35.0–45.0)
Hemoglobin: 13.6 g/dL (ref 11.7–15.5)
MCH: 31.8 pg (ref 27.0–33.0)
MCHC: 33.7 g/dL (ref 32.0–36.0)
MCV: 94.4 fL (ref 80.0–100.0)
MPV: 12.1 fL (ref 7.5–12.5)
Platelets: 206 10*3/uL (ref 140–400)
RBC: 4.28 10*6/uL (ref 3.80–5.10)
RDW: 13.1 % (ref 11.0–15.0)
WBC: 3.9 10*3/uL (ref 3.8–10.8)

## 2023-09-25 LAB — SJOGRENS SYNDROME-A EXTRACTABLE NUCLEAR ANTIBODY: SSA (Ro) (ENA) Antibody, IgG: 4.2 AI — AB

## 2023-09-25 LAB — RHEUMATOID FACTOR: Rheumatoid fact SerPl-aCnc: <10 [IU]/mL (ref <14)

## 2023-09-25 LAB — SEDIMENTATION RATE: Sed Rate: 17 mm/h (ref 0–30)

## 2023-09-25 LAB — ANA: Anti Nuclear Antibody (ANA): POSITIVE — AB

## 2023-09-25 LAB — C3 AND C4
C3 Complement: 160 mg/dL (ref 83–193)
C4 Complement: 49 mg/dL (ref 15–57)

## 2023-09-25 LAB — SJOGRENS SYNDROME-B EXTRACTABLE NUCLEAR ANTIBODY: SSB (La) (ENA) Antibody, IgG: <1 AI

## 2023-09-26 MED ORDER — IBUPROFEN 800 MG PO TABS: 800.0000 mg | ORAL_TABLET | Freq: Every day | ORAL | 0 refills | Status: DC | PRN

## 2023-09-29 ENCOUNTER — Encounter: Payer: Self-pay | Admitting: Physician Assistant

## 2023-09-30 ENCOUNTER — Encounter: Payer: Self-pay | Admitting: Internal Medicine

## 2023-10-01 ENCOUNTER — Ambulatory Visit: Payer: 59 | Admitting: Physician Assistant

## 2023-10-01 DIAGNOSIS — F5102 Adjustment insomnia: Secondary | ICD-10-CM

## 2023-10-01 DIAGNOSIS — M19042 Primary osteoarthritis, left hand: Secondary | ICD-10-CM

## 2023-10-01 DIAGNOSIS — M3501 Sicca syndrome with keratoconjunctivitis: Secondary | ICD-10-CM

## 2023-10-01 DIAGNOSIS — M5136 Other intervertebral disc degeneration, lumbar region with discogenic back pain only: Secondary | ICD-10-CM

## 2023-10-01 DIAGNOSIS — Z79899 Other long term (current) drug therapy: Secondary | ICD-10-CM

## 2023-10-01 DIAGNOSIS — R519 Headache, unspecified: Secondary | ICD-10-CM

## 2023-10-01 DIAGNOSIS — Z8639 Personal history of other endocrine, nutritional and metabolic disease: Secondary | ICD-10-CM

## 2023-10-01 DIAGNOSIS — R202 Paresthesia of skin: Secondary | ICD-10-CM

## 2023-10-01 DIAGNOSIS — M797 Fibromyalgia: Secondary | ICD-10-CM

## 2023-10-01 DIAGNOSIS — K219 Gastro-esophageal reflux disease without esophagitis: Secondary | ICD-10-CM

## 2023-10-01 DIAGNOSIS — R7303 Prediabetes: Secondary | ICD-10-CM

## 2023-10-01 DIAGNOSIS — Z13820 Encounter for screening for osteoporosis: Secondary | ICD-10-CM

## 2023-10-01 DIAGNOSIS — M1711 Unilateral primary osteoarthritis, right knee: Secondary | ICD-10-CM

## 2023-10-01 DIAGNOSIS — E781 Pure hyperglyceridemia: Secondary | ICD-10-CM

## 2023-10-01 DIAGNOSIS — M249 Joint derangement, unspecified: Secondary | ICD-10-CM

## 2023-10-01 DIAGNOSIS — Z96652 Presence of left artificial knee joint: Secondary | ICD-10-CM

## 2023-10-01 DIAGNOSIS — J302 Other seasonal allergic rhinitis: Secondary | ICD-10-CM

## 2023-10-01 DIAGNOSIS — M1990 Unspecified osteoarthritis, unspecified site: Secondary | ICD-10-CM

## 2023-10-01 DIAGNOSIS — M7061 Trochanteric bursitis, right hip: Secondary | ICD-10-CM

## 2023-10-01 DIAGNOSIS — Z8261 Family history of arthritis: Secondary | ICD-10-CM

## 2023-10-01 DIAGNOSIS — F419 Anxiety disorder, unspecified: Secondary | ICD-10-CM

## 2023-10-05 ENCOUNTER — Encounter: Payer: Self-pay | Admitting: Internal Medicine

## 2023-10-05 ENCOUNTER — Ambulatory Visit (INDEPENDENT_AMBULATORY_CARE_PROVIDER_SITE_OTHER): Admitting: Internal Medicine

## 2023-10-05 VITALS — BP 124/74 | Ht 65.0 in | Wt 234.8 lb

## 2023-10-05 DIAGNOSIS — K644 Residual hemorrhoidal skin tags: Secondary | ICD-10-CM

## 2023-10-05 MED ORDER — PHENYLEPHRINE IN HARD FAT 0.25 % RE SUPP: 1.0000 | Freq: Two times a day (BID) | RECTAL | 0 refills | Status: DC

## 2023-10-05 NOTE — Progress Notes (Signed)
 Subjective:    Patient ID: [name], female    DOB: [birth year], 58 y.o.   MRN: [id number]  HPI   Discussed the use of AI scribe software for clinical note transcription with the patient, who gave verbal consent to proceed.   [name] "[name]" is a 58 year old female who presents with concerns of pinkish discharge and ulceration related to hemorrhoids.  She has ongoing issues with hemorrhoids, characterized by a pinkish tinged discharge, which she describes as possibly being blood or drainage. Her husband noted that the hemorrhoids appeared ruptured, raising concerns about infection, especially since she was taking Imuran . She has since discontinued Imuran  to prevent potential infection.  She experiences discomfort depending on her sitting position but denies constipation and diarrhea. She has been using Proctofoam for relief, although she ran out last night.  She has an upcoming appointment with a GI specialist in June.      Review of Systems     Past Medical History:  Diagnosis Date   Allergy    Anemia    h/o   Anxiety    DDD (degenerative disc disease), lumbar    Depression    Diabetes mellitus, type 2 (HCC)    Fibromyalgia    GERD (gastroesophageal reflux disease)    Morbid obesity (HCC)    Pneumonia 01/2022   PONV (postoperative nausea and vomiting)    Pre-diabetes    Sjogren's syndrome (HCC)     Current Outpatient Medications  Medication Sig Dispense Refill   acetaminophen  (TYLENOL  8 HOUR ARTHRITIS PAIN) 650 MG CR tablet Take 1,300 mg by mouth daily.     albuterol  (VENTOLIN  HFA) 108 (90 Base) MCG/ACT inhaler TAKE 2 PUFFS BY MOUTH EVERY 6 HOURS AS NEEDED FOR WHEEZE OR SHORTNESS OF BREATH 8.5 each 2   azaTHIOprine  (IMURAN ) 50 MG tablet TAKE 1 TABLET BY MOUTH EVERY DAY 30 tablet 2   Blood Glucose Monitoring Suppl DEVI 1 each by Does not apply route in the morning, at noon, and at bedtime. May substitute to any manufacturer covered by patient's  insurance. 1 each 3   Blood Glucose Monitoring Suppl DEVI 1 each by Does not apply route in the morning, at noon, and at bedtime. May substitute to any manufacturer covered by patient's insurance. 1 each 0   Continuous Glucose Sensor (DEXCOM G7 SENSOR) MISC 1 Device by Does not apply route every 14 (fourteen) days. 6 each 1   doxycycline  (VIBRA -TABS) 100 MG tablet Take 1 tablet (100 mg total) by mouth 2 (two) times daily. 20 tablet 0   eszopiclone  (LUNESTA ) 2 MG TABS tablet Take 1 tablet (2 mg total) by mouth at bedtime as needed for sleep. Take immediately before bedtime 30 tablet 0   famotidine  (PEPCID ) 20 MG tablet TAKE 1 TABLET (20 MG TOTAL) BY MOUTH 2 (TWO) TIMES DAILY AS NEEDED FOR HEARTBURN OR INDIGESTION. 180 tablet 0   fexofenadine -pseudoephedrine (ALLEGRA-D 24) 180-240 MG 24 hr tablet Take 1 tablet by mouth daily. 180 tablet 0   fluticasone  (FLONASE  ALLERGY RELIEF) 50 MCG/ACT nasal spray      glucose blood test strip Use 1-3 times daily as needed DX E11.65 100 each 12   hydrOXYzine  (VISTARIL ) 25 MG capsule Take 1 capsule (25 mg total) by mouth daily as needed. 90 capsule 0   meloxicam  (MOBIC ) 7.5 MG tablet Take 7.5-15 mg by mouth daily.     methocarbamol  (ROBAXIN ) 500 MG tablet Take 500 mg by mouth 3 (three) times daily as needed.  Multiple Vitamin (MULTIVITAMIN PO) Take by mouth daily.     pantoprazole  (PROTONIX ) 40 MG tablet Take 1 tablet (40 mg total) by mouth daily. 90 tablet 1   predniSONE  (DELTASONE ) 5 MG tablet Take 4 tabs po x 2 days, 3  tabs po x 2 days, 2 tabs po x 2 days, 1  tab po x 2 days. Take in the morning with breakfast. Do not take with NSAIDS. 20 tablet 0   rosuvastatin  (CRESTOR ) 5 MG tablet Take 1 tablet (5 mg total) by mouth 3 (three) times a week. 45 tablet 1   traMADol  (ULTRAM ) 50 MG tablet Take 1 tablet (50 mg total) by mouth every 6 (six) hours as needed. 30 tablet 0   VITAMIN D  PO Take 1 tablet by mouth 2 (two) times a week.     No current  facility-administered medications for this visit.    Allergies  Allergen Reactions   Hydrochlorothiazide  Other (See Comments)    The pt state she couldn't function and felt like she was dying    Lipitor [Atorvastatin ]     Muscle pain   Penicillins Other (See Comments)    Childhood allergy Has patient had a PCN reaction causing immediate rash, facial/tongue/throat swelling, SOB or lightheadedness with hypotension: Unknown Has patient had a PCN reaction causing severe rash involving mucus membranes or skin necrosis: Unknown Has patient had a PCN reaction that required hospitalization: No  Has patient had a PCN reaction occurring within the last 10 years: No  If all of the above answers are "NO", then may proceed with Cephalosporin use.    Orange Juice [Orange Oil] Rash    rash    Family History  Problem Relation Age of Onset   Hyperlipidemia Mother    Rheum arthritis Sister    Osteoarthritis Sister    Fibroids Sister    Fibroids Sister    Fibroids Sister    Heart disease Maternal Aunt    Stroke Maternal Aunt    Diabetes Maternal Aunt    Heart disease Maternal Uncle    Stroke Maternal Uncle    Rheum arthritis Nephew    Cancer Neg Hx     Social History   Socioeconomic History   Marital status: Married    Spouse name: Not on file   Number of children: Not on file   Years of education: Not on file   Highest education level: Some college, no degree  Occupational History   Not on file  Tobacco Use   Smoking status: Never    Passive exposure: Current   Smokeless tobacco: Never  Vaping Use   Vaping status: Never Used  Substance and Sexual Activity   Alcohol use: Yes    Comment: occ   Drug use: Not Currently    Types: Marijuana   Sexual activity: Yes    Partners: Male    Birth control/protection: Post-menopausal, Surgical  Other Topics Concern   Not on file  Social History Narrative   Not on file   Social Drivers of Health   Financial Resource Strain: Medium  Risk (04/02/2023)   Overall Financial Resource Strain (CARDIA)    Difficulty of Paying Living Expenses: Somewhat hard  Food Insecurity: Food Insecurity Present (04/02/2023)   Hunger Vital Sign    Worried About Running Out of Food in the Last Year: Sometimes true    Ran Out of Food in the Last Year: Sometimes true  Transportation Needs: No Transportation Needs (04/02/2023)   PRAPARE - Transportation  Lack of Transportation (Medical): No    Lack of Transportation (Non-Medical): No  Physical Activity: Insufficiently Active (04/02/2023)   Exercise Vital Sign    Days of Exercise per Week: 3 days    Minutes of Exercise per Session: 20 min  Stress: Stress Concern Present (04/02/2023)   [employer] of Occupational Health - Occupational Stress Questionnaire    Feeling of Stress : To some extent  Social Connections: Moderately Integrated (04/02/2023)   Social Connection and Isolation Panel [NHANES]    Frequency of Communication with Friends and Family: More than three times a week    Frequency of Social Gatherings with Friends and Family: Three times a week    Attends Religious Services: 1 to 4 times per year    Active Member of Clubs or Organizations: No    Attends Banker Meetings: Not on file    Marital Status: Married  Catering manager Violence: Not At Risk (06/03/2022)   Humiliation, Afraid, Rape, and Kick questionnaire    Fear of Current or Ex-Partner: No    Emotionally Abused: No    Physically Abused: No    Sexually Abused: No     Constitutional: Patient reports intermittent headaches, chronic fatigue.  Denies fever, malaise, or abrupt weight changes.  Respiratory: Denies difficulty breathing, shortness of breath, cough or sputum production.   Cardiovascular: Denies chest pain, chest tightness, palpitations or swelling in the hands or feet.  Gastrointestinal: Patient reports intermittent reflux, rectal pain, blood in stool.  Denies abdominal pain, bloating.   Neurological: Patient reports insomnia.   No other specific complaints in a complete review of systems (except as listed in HPI above).  BP 124/74 (BP Location: Left Arm, Patient Position: Sitting, Cuff Size: Normal)   Ht 5\' 5"  (1.651 m)   Wt 234 lb 12.8 oz (106.5 kg)   BMI 39.07 kg/m     Wt Readings from Last 3 Encounters:  07/21/23 248 lb (112.5 kg)  06/30/23 248 lb (112.5 kg)  05/06/23 260 lb 3.2 oz (118 kg)    General: Appears her stated age, obese, in NAD. Skin: Warm, dry and intact.  Cardiovascular: Normal rate. Pulmonary/Chest: Normal effort. Rectal: External thrombosed ulcerated hemorrhoid at 8:00. Neurological: Alert and oriented.    BMET    Component Value Date/Time   NA 140 04/02/2023 1408   NA 142 09/18/2016 0929   K 4.2 04/02/2023 1408   CL 107 04/02/2023 1408   CO2 26 04/02/2023 1408   GLUCOSE 96 04/02/2023 1408   BUN 14 04/02/2023 1408   BUN 9 09/18/2016 0929   CREATININE 0.75 04/02/2023 1408   CALCIUM  9.8 04/02/2023 1408   GFRNONAA >60 01/15/2022 2334   GFRAA >60 12/01/2019 1558    Lipid Panel     Component Value Date/Time   CHOL 228 (H) 04/02/2023 1408   CHOL 200 (H) 03/23/2017 0844   TRIG 134 04/02/2023 1408   HDL 70 04/02/2023 1408   HDL 57 03/23/2017 0844   CHOLHDL 3.3 04/02/2023 1408   VLDL 36.2 03/19/2020 0918   LDLCALC 133 (H) 04/02/2023 1408    CBC    Component Value Date/Time   WBC 5.9 04/02/2023 1408   RBC 4.25 04/02/2023 1408   HGB 12.9 04/02/2023 1408   HGB 13.2 09/18/2016 0929   HCT 39.7 04/02/2023 1408   HCT 38.1 09/18/2016 0929   PLT 237 04/02/2023 1408   PLT 268 09/18/2016 0929   MCV 93.4 04/02/2023 1408   MCV 94 09/18/2016 0929  MCH 30.4 04/02/2023 1408   MCHC 32.5 04/02/2023 1408   RDW 13.2 04/02/2023 1408   RDW 12.8 09/18/2016 0929   LYMPHSABS 1,357 03/23/2023 1038   LYMPHSABS 2.8 08/03/2014 1609   MONOABS 0.6 08/30/2019 0315   EOSABS 80 03/23/2023 1038   EOSABS 0.1 08/03/2014 1609   BASOSABS 21  03/23/2023 1038   BASOSABS 0.0 08/03/2014 1609    Hgb A1C Lab Results  Component Value Date   HGBA1C 6.5 (H) 04/02/2023            Assessment & Plan:   Assessment and Plan    Ulcerated hemorrhoids Ulcerated hemorrhoids with discharge, no infection. Epsom salt sitz baths recommended.   - Prescribed phenylephrine  suppository 0.25% twice daily for six days. - Continue Epsom salt sitz baths. - Follow up with GI in June.        RTC in 1 months for your annual exam Helayne Lo, NP

## 2023-10-05 NOTE — Progress Notes (Signed)
 [facility] Initial Consultation   Referring Provider Carollynn Cirri, NP [address] [place],  [place] [address]  Primary Care Provider [place], Rankin Buzzard, NP  Patient Profile: [name] is a 58 y.o. female who is seen in consultation in the [facility] at the request of Dr. Thalia Filler for evaluation and management of the problem(s) noted below.  Problem List: Hemorrhoids Constipation GERD History of colon polyps -tubular adenoma and hyperplastic polyps Left-sided colonic diverticulosis Hepatic steatosis Cholelithiasis status postcholecystectomy   History of Present Illness   [name] is a 58 y.o. female with a history of HLD, T2DM, osteoarthritis, headaches, Sjogren's syndrome, fibromyalgia who presents for evaluation and management of hemorrhoids, constipation, GERD and history of colon polyps.  Hemorrhoids/constipation -- Developed anorectal discomfort approximately 3 weeks ago -stinging and burning with some pain -- Noted an enlarged hemorrhoid at that time - " felt like a bump" -- Has noted some intermittent pink bleeding and bright red blood -- Utilizing sitz bath's with Epsom salt -- Prescribed Proctofoam which has improved symptoms  -- Saw primary care NP yesterday who performed perianal exam and noted ulcerated hemorrhoid -- Started holding Imuran  because she was concerned about a hemorrhoid becoming infected  -- Endorses symptoms of frequent constipation -- After the use of the stool softener will have a large bowel movement -- States that her recent hemorrhoid symptoms seem to occur after the use of a stool softener and having a large BM -- She has now incorporated fiber into her diet by eating shredded wheat daily which seems to be helping symptoms   History of colon polyps-tubular adenoma and hyperplastic polyps -- Colonoscopy 2021 disclosed 3 polyps -tubular adenoma and hyperplastic polyp -advised to have a 3-year follow-up and was  due in 2024 -- She is amenable to scheduling updated colonoscopy -- No family history of colorectal cancer or polyps  GERD -- Has a history of GERD currently being treated with pantoprazole  40 mg orally daily -- When she has an exacerbation of GERD symptoms will titrate pantoprazole  to twice daily dosing -- Denies dysphagia, pyrosis or regurgitation -- No family history of Barrett's esophagus or esophageal cancer  Last colonoscopy: 03/2020 - 3 polyps (TA, HP), diverticulosis, internal hemorrhoids Last endoscopy: 03/2020 - striped, moderately erythematous mucosa in the gastric body and antrum - bx with slight chronic inflammation H. pylori negative  Last Abd CT/CTE/MRE: 08/2019 -mildly distended gallbladder, hepatic steatosis, rectosigmoid diverticulosis without diverticulitis  GI Review of Symptoms Significant for hemorrhoid pain, constipation. Otherwise negative.  General Review of Systems  Review of systems is significant for the pertinent positives and negatives as listed per the HPI.  Full ROS is otherwise negative.  Past Medical History   Past Medical History:  Diagnosis Date   Allergy    Anemia    h/o   Anxiety    DDD (degenerative disc disease), lumbar    Depression    Diabetes mellitus, type 2 (HCC)    Fibromyalgia    GERD (gastroesophageal reflux disease)    Morbid obesity (HCC)    Pneumonia 01/2022   PONV (postoperative nausea and vomiting)    Pre-diabetes    Sjogren's syndrome (HCC)      Past Surgical History   Past Surgical History:  Procedure Laterality Date   ABDOMINAL HYSTERECTOMY  2008   LAVH, endometriosis ablation, mccall culdoplasty   CHOLECYSTECTOMY N/A 08/31/2019   Procedure: LAPAROSCOPIC CHOLECYSTECTOMY;  Surgeon: Emmalene Hare, MD;  Location: [facility] ORS;  Service: General;  Laterality:  N/A;   DIAGNOSTIC LAPAROSCOPY  2002   Hysteroscopy, diagnostic laparoscopy, extensive lysis of adhesions, endometrial ablation, RSO   RIGHT OOPHORECTOMY  2007    with right salpingectomy   TOTAL KNEE ARTHROPLASTY 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   Procedure: Partial KNEE ARTHROPLASTY;  Surgeon: Jerlyn Moons, MD;  Location: [facility] ORS;  Service: Orthopedics;  Laterality: Left;   TUBAL LIGATION       Allergies and Medications   Allergies  Allergen Reactions   Hydrochlorothiazide  Other (See Comments)    The pt state she couldn't function and felt like she was dying    Lipitor [Atorvastatin ]     Muscle pain   Penicillins Other (See Comments)    Childhood allergy Has patient had a PCN reaction causing immediate rash, facial/tongue/throat swelling, SOB or lightheadedness with hypotension: Unknown Has patient had a PCN reaction causing severe rash involving mucus membranes or skin necrosis: Unknown Has patient had a PCN reaction that required hospitalization: No  Has patient had a PCN reaction occurring within the last 10 years: No  If all of the above answers are "NO", then may proceed with Cephalosporin use.    Orange Juice [Orange Oil] Rash    rash    Current Meds  Medication Sig   acetaminophen  (TYLENOL  8 HOUR ARTHRITIS PAIN) 650 MG CR tablet Take 1,300 mg by mouth daily.   albuterol  (VENTOLIN  HFA) 108 (90 Base) MCG/ACT inhaler TAKE 2 PUFFS BY MOUTH EVERY 6 HOURS AS NEEDED FOR WHEEZE OR SHORTNESS OF BREATH   azaTHIOprine  (IMURAN ) 50 MG tablet TAKE 1 TABLET BY MOUTH EVERY DAY   Blood Glucose Monitoring Suppl DEVI 1 each by Does not apply route in the morning, at noon, and at bedtime. May substitute to any manufacturer covered by patient's insurance.   Blood Glucose Monitoring Suppl DEVI 1 each by Does not apply route in the morning, at noon, and at bedtime. May substitute to any manufacturer covered by patient's insurance.   Cinnamon-Chromium-Biotin ([employer] CINNAMON COMPLEX PO) Take by mouth.   Continuous Glucose Sensor (DEXCOM G7 SENSOR) MISC 1 Device by Does not apply route every 14 (fourteen) days.   DULoxetine  HCl 20 MG CSDR Take 20 mg by mouth daily.    eszopiclone  (LUNESTA ) 2 MG TABS tablet Take 1 tablet (2 mg total) by mouth at bedtime as needed for sleep. Take immediately before bedtime   famotidine  (PEPCID ) 20 MG tablet TAKE 1 TABLET (20 MG TOTAL) BY MOUTH 2 (TWO) TIMES DAILY AS NEEDED FOR HEARTBURN OR INDIGESTION.   fexofenadine -pseudoephedrine (ALLEGRA-D 24) 180-240 MG 24 hr tablet Take 1 tablet by mouth daily.   fluticasone  (FLONASE  ALLERGY RELIEF) 50 MCG/ACT nasal spray    glucose blood test strip Use 1-3 times daily as needed DX E11.65   hydrocortisone  (ANUSOL -HC) 25 MG suppository Place 1 suppository (25 mg total) rectally at bedtime.   hydrocortisone -pramoxine (PROCTOFOAM-HC) rectal foam Place 1 applicator rectally 2 (two) times daily.   ibuprofen  (ADVIL ) 800 MG tablet Take 1 tablet (800 mg total) by mouth daily as needed.   meloxicam  (MOBIC ) 7.5 MG tablet Take 7.5-15 mg by mouth daily.   methocarbamol  (ROBAXIN ) 500 MG tablet Take 1 tablet (500 mg total) by mouth 3 (three) times daily as needed.   Multiple Vitamin (MULTIVITAMIN PO) Take by mouth daily.   Na Sulfate-K Sulfate-Mg Sulfate concentrate (SUPREP) 17.5-3.13-1.6 GM/177ML SOLN Use as directed; may use generic; goodrx card if insurance will not cover generic   pantoprazole  (PROTONIX ) 40 MG tablet Take 1 tablet (40 mg total)  by mouth daily.   phenylephrine  (,USE FOR PREPARATION-H,) 0.25 % suppository Place 1 suppository rectally 2 (two) times daily.   rosuvastatin  (CRESTOR ) 5 MG tablet Take 1 tablet (5 mg total) by mouth 3 (three) times a week.   traMADol  (ULTRAM ) 50 MG tablet Take 1 tablet (50 mg total) by mouth every 6 (six) hours as needed.   VITAMIN D  PO Take 1 tablet by mouth 2 (two) times a week.   [DISCONTINUED] hydrOXYzine  (VISTARIL ) 25 MG capsule Take 1 capsule (25 mg total) by mouth daily as needed.     Family History   Family History  Problem Relation Age of Onset   Hyperlipidemia Mother    Rheum arthritis Sister    Osteoarthritis Sister    Fibroids Sister     Fibroids Sister    Fibroids Sister    Heart disease Maternal Aunt    Stroke Maternal Aunt    Diabetes Maternal Aunt    Heart disease Maternal Uncle    Stroke Maternal Uncle    Rheum arthritis Nephew    Cancer Neg Hx      Social History   Social History   Tobacco Use   Smoking status: Never    Passive exposure: Current   Smokeless tobacco: Never  Vaping Use   Vaping status: Never Used  Substance Use Topics   Alcohol use: Yes    Comment: occassionally   Drug use: Not Currently    Types: Marijuana   Shakiera reports that she has never smoked. She has been exposed to tobacco smoke. She has never used smokeless tobacco. She reports current alcohol use. She reports that she does not currently use drugs after having used the following drugs: Marijuana.  Vital Signs and Physical Examination   Vitals:   10/06/23 0953  BP: 132/80  Pulse: 82   Body mass index is 38.94 kg/m. Weight: 234 lb (106.1 kg)  General: Well developed, well nourished, no acute distress Head: Normocephalic and atraumatic Eyes: Sclerae anicteric, EOMI Lungs: Clear throughout to auscultation Heart: Regular rate and rhythm; No murmurs, rubs or bruits Abdomen: Soft, non tender and non distended. No masses, hepatosplenomegaly or hernias noted. Normal Bowel sounds Rectal: Small external hemorrhoid that appeared to be previously thrombosed and now shrinking in size with overlying ulceration; DRE disclosed palpable internal hemorrhoids Musculoskeletal: Symmetrical with no gross deformities    Review of Data  The following data was reviewed at the time of this encounter:  Laboratory Studies      Latest Ref Rng & Units 09/22/2023    2:27 PM 04/02/2023    2:08 PM 03/23/2023   10:38 AM  CBC  WBC 3.8 - 10.8 Thousand/uL 3.9  5.9  5.3   Hemoglobin 11.7 - 15.5 g/dL 40.9  81.1  91.4   Hematocrit 35.0 - 45.0 % 40.4  39.7  43.0   Platelets 140 - 400 Thousand/uL 206  237  230     Lab Results  Component Value  Date   LIPASE 42 12/01/2019      Latest Ref Rng & Units 09/22/2023    2:27 PM 04/02/2023    2:08 PM 03/23/2023   10:38 AM  CMP  Glucose 65 - 99 mg/dL 96  96  782   BUN 7 - 25 mg/dL 10  14  16    Creatinine 0.50 - 1.03 mg/dL 9.56  2.13  0.86   Sodium 135 - 146 mmol/L 142  140  141   Potassium 3.5 - 5.3 mmol/L 3.8  4.2  4.3   Chloride 98 - 110 mmol/L 107  107  107   CO2 20 - 32 mmol/L 26  26  24    Calcium  8.6 - 10.4 mg/dL 9.9  9.8  9.9   Total Protein 6.1 - 8.1 g/dL 6.1 - 8.1 g/dL 7.5    7.5  7.2  7.7   Total Bilirubin 0.2 - 1.2 mg/dL 0.5  0.5  0.5   AST 10 - 35 U/L 15  14  16    ALT 6 - 29 U/L 15  13  17       Imaging Studies  Barium esophagram 05/2020 Mild narrowing/stricture of the distal esophagus just above the GE junction. The 13 mm barium tablet was transiently stuck in this location.  AUS 11/2019 Fatty infiltration of the liver  AUS 09/2019 1. Status post cholecystectomy with a very small area of probable postoperative changes in the gallbladder fossa. No findings suspicious for an abscess or hematoma or biloma. 2. Stable diffuse fatty infiltration of the liver.  AUS 08/2019 Cholelithiasis and mild gallbladder distention but without gallbladder wall thickening or pericholecystic fluid. Unable to assess the sonographic Murphy sign in the setting of premedication. Findings are unlikely to reflect acute cholecystitis however if there is persisting clinical concern HIDA scan could be obtained.   Diffusely increased hepatic attenuation most compatible with hepatic steatosis.  CTAP 08/2019 1. The gallbladder is mildly distended with suggestion of mild gallbladder wall thickening. Further evaluation with a dedicated right upper quadrant ultrasound is recommended. 2. Hepatic steatosis. 3. Rectosigmoid diverticulosis without evidence of acute diverticulitis.    GI Procedures and Studies  EGD/Colonoscopy 03/2020 EGD - striped, moderately erythematous mucosa in the  gastric body and antrum Colonoscopy - 3 polyps, diverticulosis, internal hemorrhoids Path: Mild chronic inflammation in stomach H. pylori negative, normal duodenal biopsies, tubular adenomas and hyperplastic polyp  Colonoscopy 08/2007 2 sessile polyps in the sigmoid colon, sigmoid diverticulosis, internal and external hemorrhoids Path: Hyperplastic polyps  Clinical Impression  It is my clinical impression that Ms. Fuerte is a 58 y.o. female with;  Hemorrhoids Constipation GERD History of colon polyps -tubular adenoma and hyperplastic polyps Left-sided colonic diverticulosis Hepatic steatosis Cholelithiasis status postcholecystectomy  Ms. Cogar presents to the office today with a primary complaint of discomfort related to external hemorrhoids.  Reports that 3 weeks ago she passed a large bowel movement after using a stool softener.  Subsequently developed stinging and burning in the anal rectal region as well as a "inflamed lump" that was very likely a thrombosed external hemorrhoid.  She has noted a small amount of bleeding.  Symptoms are improving with the use of sitz bath's, Epsom salt and Proctofoam.  On my physical examination today I observed a small external hemorrhoid that was likely previously thrombosed and now in a state of healing.  There was a small amount of ulceration associated with the lesion.  Digital rectal examination disclosed what appeared to be palpable internal hemorrhoids.  I recommended ongoing conservative management as well as updated colonoscopy as she is overdue for colon polyp surveillance and she is amenable to this.  Symptoms of constipation are improving with the addition of fiber into her diet.  GERD symptoms are stable on current regimen of pantoprazole  that she titrates between once daily and twice daily.  Last EGD in 2021 did not disclose any significant abnormalities.  Ms. Bently also has a history of hepatic steatosis.  Her liver function tests have  been normal.  Given time limitations  we did not have an opportunity to explore this issue in further detail but can address it at a future clinic visit.  Plan  Prescription provided for hydrocortisone  suppositories 25 mg per rectum nightly Advised the use of topical ointments for symptom relief including Preparation H with lidocaine  and Recticare If symptoms are not improving over the next 1 to 2 weeks advised for her to notify our office Recommended that she resume her Imuran  for Sjogren's disease as I do not think it is impacting healing of her hemorrhoids Maintain soft stools with stool softener as well as daily fiber-avoid straining Schedule surveillance [facility] Continue pantoprazole  40 mg orally daily-can dose titrated to twice daily dosing as needed depending upon symptoms Can review history of hepatic steatosis in further detail at a future clinic visit.  Planned Follow Up  3 months  The patient or caregiver verbalized understanding of the material covered, with no barriers to understanding. All questions were answered. Patient or caregiver is agreeable with the plan outlined above.    It was a pleasure to see Brynna.  If you have any questions or concerns regarding this evaluation, do not hesitate to contact me.  Eugenia Hess, MD [facility]

## 2023-10-05 NOTE — Patient Instructions (Signed)
 Hemorrhoids Hemorrhoids are swollen veins that may form: In the butt (rectum). These are called internal hemorrhoids. Around the opening of the butt (anus). These are called external hemorrhoids. Most hemorrhoids do not cause very bad problems. They often get better with changes to your lifestyle and what you eat. What are the causes? Having trouble pooping (constipation) or watery poop (diarrhea). Pushing too hard when you poop. Pregnancy. Being very overweight (obese). Sitting for too long. Riding a bike for a long time. Heavy lifting or other things that take a lot of effort. Anal sex. What are the signs or symptoms? Pain. Itching or soreness in the butt. Bleeding from the butt. Leaking poop. Swelling. One or more lumps around the opening of your butt. How is this treated? In most cases, hemorrhoids can be treated at home. You may be told to: Change what you eat. Make changes to your lifestyle. If these treatments do not help, you may need to have a procedure done. Your doctor may need to: Place rubber bands at the bottom of the hemorrhoids to make them fall off. Put medicine into the hemorrhoids to shrink them. Shine a type of light on the hemorrhoids to cause them to fall off. Do surgery to get rid of the hemorrhoids. Follow these instructions at home: Medicines Take over-the-counter and prescription medicines only as told by your doctor. Use creams with medicine in them or medicines that you put in your butt as told by your doctor. Eating and drinking  Eat foods that have a lot of fiber in them. These include whole grains, beans, nuts, fruits, and vegetables. Ask your doctor about taking products that have fiber added to them (fibersupplements). Take in less fat. You can do this by: Eating low-fat dairy products. Eating less red meat. Staying away from processed foods. Drink enough fluid to keep your pee (urine) pale yellow. Managing pain and swelling  Take a  warm-water bath (sitz bath) for 20 minutes to ease pain. Do this 3-4 times a day. You may do this in a bathtub. You may also use a portable sitz bath that fits over the toilet. If told, put ice on the painful area. It may help to use ice between your warm baths. Put ice in a plastic bag. Place a towel between your skin and the bag. Leave the ice on for 20 minutes, 2-3 times a day. If your skin turns bright red, take off the ice right away to prevent skin damage. The risk of damage is higher if you cannot feel pain, heat, or cold. General instructions Exercise. Ask your doctor how much and what kind of exercise is best for you. Go to the bathroom when you need to poop. Do not wait. Try not to push too hard when you poop. Keep your butt dry and clean. Use wet toilet paper or moist towelettes after you poop. Do not sit on the toilet for a long time. Contact a doctor if: You have pain and swelling that do not get better with treatment. You have trouble pooping. You cannot poop. You have pain or swelling outside the area of the hemorrhoids. Get help right away if: You have bleeding from the butt that will not stop. This information is not intended to replace advice given to you by your health care provider. Make sure you discuss any questions you have with your health care provider. Document Revised: 02/12/2022 Document Reviewed: 02/12/2022 Elsevier Patient Education  2024 [employer].

## 2023-10-06 ENCOUNTER — Encounter: Payer: Self-pay | Admitting: Pediatrics

## 2023-10-06 ENCOUNTER — Ambulatory Visit: Admitting: Pediatrics

## 2023-10-06 ENCOUNTER — Other Ambulatory Visit: Payer: Self-pay | Admitting: Internal Medicine

## 2023-10-06 VITALS — BP 132/80 | HR 82 | Ht 65.0 in | Wt 234.0 lb

## 2023-10-06 DIAGNOSIS — K760 Fatty (change of) liver, not elsewhere classified: Secondary | ICD-10-CM

## 2023-10-06 DIAGNOSIS — Z860102 Personal history of hyperplastic colon polyps: Secondary | ICD-10-CM | POA: Diagnosis not present

## 2023-10-06 DIAGNOSIS — K5730 Diverticulosis of large intestine without perforation or abscess without bleeding: Secondary | ICD-10-CM

## 2023-10-06 DIAGNOSIS — Z860101 Personal history of adenomatous and serrated colon polyps: Secondary | ICD-10-CM | POA: Diagnosis not present

## 2023-10-06 DIAGNOSIS — Z8719 Personal history of other diseases of the digestive system: Secondary | ICD-10-CM | POA: Diagnosis not present

## 2023-10-06 DIAGNOSIS — K219 Gastro-esophageal reflux disease without esophagitis: Secondary | ICD-10-CM | POA: Diagnosis not present

## 2023-10-06 DIAGNOSIS — Z860100 Personal history of colon polyps, unspecified: Secondary | ICD-10-CM

## 2023-10-06 DIAGNOSIS — K649 Unspecified hemorrhoids: Secondary | ICD-10-CM

## 2023-10-06 DIAGNOSIS — K644 Residual hemorrhoidal skin tags: Secondary | ICD-10-CM

## 2023-10-06 DIAGNOSIS — K5900 Constipation, unspecified: Secondary | ICD-10-CM | POA: Diagnosis not present

## 2023-10-06 MED ORDER — HYDROCORTISONE ACETATE 25 MG RE SUPP: 25.0000 mg | Freq: Every day | RECTAL | 0 refills | Status: DC

## 2023-10-06 MED ORDER — NA SULFATE-K SULFATE-MG SULF 17.5-3.13-1.6 GM/177ML PO SOLN
ORAL | 0 refills | Status: DC
Start: 2023-10-06 — End: 2023-11-24

## 2023-10-06 NOTE — Patient Instructions (Addendum)
 You have been scheduled for a colonoscopy. Please follow written instructions given to you at your visit today.   If you use inhalers (even only as needed), please bring them with you on the day of your procedure.  DO NOT TAKE 7 DAYS PRIOR TO TEST- Trulicity (dulaglutide) Ozempic, Wegovy (semaglutide) Mounjaro (tirzepatide) Bydureon Bcise (exanatide extended release)  DO NOT TAKE 1 DAY PRIOR TO YOUR TEST Rybelsus (semaglutide) Adlyxin (lixisenatide) Victoza (liraglutide) Byetta (exanatide) ___________________________________________________________________  Please purchase the following medications over the counter and take as directed: Recticare Advanced-As directed OR Preparation H with lidocaine   We have sent the following medications to your pharmacy for you to pick up at your convenience: Hydrocortisone  suppositories- 1 suppository per rectum every night  If your blood pressure at your visit was 140/90 or greater, please contact your primary care physician to follow up on this.  _______________________________________________________  If you are age 49 or older, your body mass index should be between 23-30. Your Body mass index is 38.94 kg/m. If this is out of the aforementioned range listed, please consider follow up with your Primary Care Provider.  If you are age 74 or younger, your body mass index should be between 19-25. Your Body mass index is 38.94 kg/m. If this is out of the aformentioned range listed, please consider follow up with your Primary Care Provider.   ________________________________________________________  The [facility] providers would like to encourage you to use MYCHART to communicate with providers for non-urgent requests or questions.  Due to long hold times on the telephone, sending your provider a message by [facility] may be a faster and more efficient way to get a response.  Please allow 48 business hours for a response.  Please remember that this  is for non-urgent requests.  _______________________________________________________   Due to recent changes in healthcare laws, you may see the results of your imaging and laboratory studies on MyChart before your provider has had a chance to review them.  We understand that in some cases there may be results that are confusing or concerning to you. Not all laboratory results come back in the same time frame and the provider may be waiting for multiple results in order to interpret others.  Please give us  48 hours in order for your provider to thoroughly review all the results before contacting the office for clarification of your results.   _____________________________________________________  High-Fiber Eating Plan Fiber, also called dietary fiber, is found in foods such as fruits, vegetables, whole grains, and beans. A high-fiber diet can be good for your health. Your health care provider may recommend a high-fiber diet to help: Prevent trouble pooping (constipation). Lower your cholesterol. Treat the following conditions: Hemorrhoids. This is inflammation of veins in the anus. Inflammation of specific areas of the digestive tract. Irritable bowel syndrome (IBS). This is a problem of the large intestine, also called the colon, that sometimes causes belly pain and bloating. Prevent overeating as part of a weight-loss plan. Lower the risk of heart disease, type 2 diabetes, and certain cancers. What are tips for following this plan? Reading food labels  Check the nutrition facts label on foods for the amount of dietary fiber. Choose foods that have 4 grams of fiber or more per serving. The recommended goals for how much fiber you should eat each day include: Males 27 years old or younger: 30-34 g. Males over 93 years old: 28-34 g. Females 26 years old or younger: 25-28 g. Females over 96 years old:  22-25 g. Your daily fiber goal is _____________ g. Shopping Choose whole fruits and  vegetables instead of processed. For example, choose apples instead of apple juice or applesauce. Choose a variety of high-fiber foods such as avocados, lentils, oats, and pinto beans. Read the nutrition facts label on foods. Check for foods with added fiber. These foods often have high sugar and salt (sodium) amounts per serving. Cooking Use whole-grain flour for baking and cooking. Cook with brown rice instead of white rice. Make meals that have a lot of beans and vegetables in them, such as chili or vegetable-based soups. Meal planning Start the day with a breakfast that is high in fiber, such as a cereal that has 5 g of fiber or more per serving. Eat breads and cereals that are made with whole-grain flour instead of refined flour or white flour. Eat brown rice, bulgur wheat, or millet instead of white rice. Use beans in place of meat in soups, salads, and pasta dishes. Be sure that half of the grains you eat each day are whole grains. General information You can get the recommended amount of dietary fiber by: Eating a variety of fruits, vegetables, grains, nuts, and beans. Taking a fiber supplement if you aren't able to eat enough fiber. It's better to get fiber through food than from a supplement. Slowly increase how much fiber you eat. If you increase the amount of fiber you eat too quickly, you may have bloating, cramping, or gas. Drink plenty of water  to help you digest fiber. Choose high-fiber snacks, such as berries, raw vegetables, nuts, and popcorn. What foods should I eat? Fruits Berries. Pears. Apples. Oranges. Avocado. Prunes and raisins. Dried figs. Vegetables Sweet potatoes. Spinach. Kale. Artichokes. Cabbage. Broccoli. Cauliflower. Green peas. Carrots. Squash. Grains Whole-grain breads. Multigrain cereal. Oats and oatmeal. Brown rice. Barley. Bulgur wheat. Millet. Quinoa. Bran muffins. Popcorn. Rye wafer crackers. Meats and other proteins Navy beans, kidney beans, and  pinto beans. Soybeans. Split peas. Lentils. Nuts and seeds. Dairy Fiber-fortified yogurt. Fortified means that fiber has been added to the product. Beverages Fiber-fortified soy milk. Fiber-fortified orange juice. Other foods Fiber bars. The items listed above may not be all the foods and drinks you can have. Talk to a dietitian to learn more. What foods should I avoid? Fruits Fruit juice. Cooked, strained fruit. Vegetables Fried potatoes. Canned vegetables. Well-cooked vegetables. Grains White bread. Pasta made with refined flour. White rice. Meats and other proteins Fatty meat. Fried chicken or fried fish. Dairy Milk. Cream cheese. Sour cream. Fats and oils Butters. Beverages Soft drinks. Other foods Cakes and pastries. The items listed above may not be all the foods and drinks you should avoid. Talk to a dietitian to learn more. This information is not intended to replace advice given to you by your health care provider. Make sure you discuss any questions you have with your health care provider. Document Revised: 08/25/2022 Document Reviewed: 08/25/2022 Elsevier Patient Education  2024 [employer].

## 2023-10-07 NOTE — Telephone Encounter (Signed)
 Requested Prescriptions  Pending Prescriptions Disp Refills   hydrOXYzine  (VISTARIL ) 25 MG capsule [Pharmacy Med Name: HYDROXYZINE  PAM 25 MG CAP] 90 capsule 1    Sig: TAKE 1 CAPSULE (25 MG TOTAL) BY MOUTH DAILY AS NEEDED.     Ear, Nose, and Throat:  Antihistamines 2 Failed - 10/07/2023 10:57 AM      Failed - Valid encounter within last 12 months    Recent Outpatient Visits           2 days ago Ulcerated external hemorrhoids   [place] [facility] [place], Rankin Buzzard, NP   2 weeks ago Rectal pain   [place] [facility] [place], Rankin Buzzard, NP       Future Appointments             In 3 weeks [facility]            Passed - Cr in normal range and within 360 days    Creat  Date Value Ref Range Status  09/22/2023 0.60 0.50 - 1.03 mg/dL Final   Creatinine, Urine  Date Value Ref Range Status  09/22/2023 172 20 - 275 mg/dL Final  54/02/8118 147 20 - 275 mg/dL Final

## 2023-10-08 ENCOUNTER — Encounter: Payer: 59 | Admitting: Internal Medicine

## 2023-10-09 ENCOUNTER — Telehealth: Payer: Self-pay | Admitting: Podiatry

## 2023-10-09 NOTE — Telephone Encounter (Signed)
 Patient has been called to cancel shoe appt; would like a referral to [facility]

## 2023-10-14 ENCOUNTER — Encounter: Payer: Self-pay | Admitting: Internal Medicine

## 2023-10-14 NOTE — Progress Notes (Unsigned)
 Office Visit Note  Patient: [name]             Date of Birth: Nov 10, 1965           MRN: [id number]             PCP: Carollynn Cirri, NP Referring: Carollynn Cirri, NP Visit Date: 10/28/2023 Occupation: @GUAROCC @  Subjective:  Myofascial pain  History of Present Illness: [name] is a 58 y.o. female with history of sjogren's syndrome and inflammatory arthritis.  Patient remains on Imuran  50 mg 1 tablet by mouth daily.  She is tolerating Imuran  without any side effects and has not had any recent gaps in therapy.  Patient presents today experiencing generalized myalgias and muscle tenderness consistent with a fibromyalgia flare.  She has been under increased rest recently which she feels is contributing to her increased myofascial pain.  Her primary care planned on initiating Lyrica  50 mg 1 capsule twice daily but she has not yet started the medication due to wanting to make sure it would not interact with any of the medications we prescribed.  Patient states that she had to discontinue duloxetine  due to increased palpitations.  She has noticed some increased anxiety since discontinuing Cymbalta . Patient continues to have chronic sicca symptoms especially mouth dryness at night.  She has also been experiencing some discomfort in both hands especially with fine motor activities.   Activities of Daily Living:  Patient reports morning stiffness for several hours.   Patient Reports nocturnal pain.  Difficulty dressing/grooming: Reports Difficulty climbing stairs: Reports Difficulty getting out of chair: Reports Difficulty using hands for taps, buttons, cutlery, and/or writing: Reports  Review of Systems  Constitutional:  Positive for fatigue.  HENT:  Positive for mouth dryness. Negative for mouth sores.   Eyes:  Positive for dryness.  Respiratory:  Positive for cough. Negative for shortness of breath.   Cardiovascular:  Negative for chest pain and palpitations.   Gastrointestinal:  Positive for constipation. Negative for blood in stool and diarrhea.  Endocrine: Negative for increased urination.  Genitourinary:  Negative for involuntary urination.  Musculoskeletal:  Positive for joint pain, gait problem, joint pain, joint swelling, myalgias, muscle weakness, morning stiffness, muscle tenderness and myalgias.  Skin:  Negative for color change, rash, hair loss and sensitivity to sunlight.  Allergic/Immunologic: Negative for susceptible to infections.  Neurological:  Positive for headaches. Negative for dizziness.  Hematological:  Negative for swollen glands.  Psychiatric/Behavioral:  Positive for sleep disturbance. Negative for depressed mood. The patient is nervous/anxious.     PMFS History:  Patient Active Problem List   Diagnosis Date Noted   Type 2 diabetes mellitus with hyperglycemia, without long-term current use of insulin (HCC) 04/10/2023   Primary osteoarthritis of both hands 10/31/2021   Fibromyalgia 10/24/2021   Sjogren's syndrome (HCC) 10/24/2021   DDD (degenerative disc disease), lumbar 10/14/2021   Insomnia 02/14/2021   Hyperlipidemia associated with type 2 diabetes mellitus (HCC) 12/31/2020   Frequent headaches 12/31/2020   Class 2 obesity due to excess calories with body mass index (BMI) of 39.0 to 39.9 in adult 12/31/2020   Gastroesophageal reflux disease without esophagitis 11/15/2018   Anxiety and depression 08/03/2014    Past Medical History:  Diagnosis Date   Allergy    Anemia    h/o   Anxiety    DDD (degenerative disc disease), lumbar    Depression    Diabetes mellitus, type 2 (HCC)    Fibromyalgia  GERD (gastroesophageal reflux disease)    Morbid obesity (HCC)    Pneumonia 01/2022   PONV (postoperative nausea and vomiting)    Pre-diabetes    Sjogren's syndrome (HCC)     Family History  Problem Relation Age of Onset   Hyperlipidemia Mother    Rheum arthritis Sister    Osteoarthritis Sister    Fibroids  Sister    Fibroids Sister    Fibroids Sister    Heart disease Maternal Aunt    Stroke Maternal Aunt    Diabetes Maternal Aunt    Heart disease Maternal Uncle    Stroke Maternal Uncle    Rheum arthritis Nephew    Cancer Neg Hx    Past Surgical History:  Procedure Laterality Date   ABDOMINAL HYSTERECTOMY  2008   LAVH, endometriosis ablation, mccall culdoplasty   CHOLECYSTECTOMY N/A 08/31/2019   Procedure: LAPAROSCOPIC CHOLECYSTECTOMY;  Surgeon: Emmalene Hare, MD;  Location: [facility] ORS;  Service: General;  Laterality: N/A;   COLONOSCOPY  2022   DIAGNOSTIC LAPAROSCOPY  2002   Hysteroscopy, diagnostic laparoscopy, extensive lysis of adhesions, endometrial ablation, RSO   RIGHT OOPHORECTOMY  2007   with right salpingectomy   TOTAL KNEE ARTHROPLASTY 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   Procedure: Partial KNEE ARTHROPLASTY;  Surgeon: Jerlyn Moons, MD;  Location: [facility] ORS;  Service: Orthopedics;  Laterality: Left;   TUBAL LIGATION     Social History   Social History Narrative   Not on file   Immunization History  Administered Date(s) Administered   Influenza, Seasonal, Injecte, Preservative Fre 04/02/2023   Influenza,inj,Quad PF,6+ Mos 03/17/2015, 03/17/2017, 02/25/2019, 03/19/2020, 03/28/2021   PFIZER(Purple Top)SARS-COV-2 Vaccination 07/11/2019, 08/01/2019   Pneumococcal Polysaccharide-23 09/22/2023   Tdap 02/09/2007, 09/18/2016     Objective: Vital Signs: BP (!) 148/89 (BP Location: Left Arm, Patient Position: Sitting, Cuff Size: Normal)   Pulse 86   Resp 14   Ht 5' 5.5" (1.664 m)   Wt 233 lb 6.4 oz (105.9 kg)   BMI 38.25 kg/m    Physical Exam Vitals and nursing note reviewed.  Constitutional:      Appearance: She is well-developed.  HENT:     Head: Normocephalic and atraumatic.  Eyes:     Conjunctiva/sclera: Conjunctivae normal.  Cardiovascular:     Rate and Rhythm: Normal rate and regular rhythm.     Heart sounds: Normal heart sounds.  Pulmonary:     Effort: Pulmonary effort is  normal.     Breath sounds: Normal breath sounds.  Abdominal:     General: Bowel sounds are normal.     Palpations: Abdomen is soft.  Musculoskeletal:     Cervical back: Normal range of motion.  Lymphadenopathy:     Cervical: No cervical adenopathy.  Skin:    General: Skin is warm and dry.     Capillary Refill: Capillary refill takes less than 2 seconds.  Neurological:     Mental Status: She is alert and oriented to person, place, and time.  Psychiatric:        Behavior: Behavior normal.      Musculoskeletal Exam: Generalized hyperalgesia and positive tender points on exam.  C-spine has good range of motion.  Trapezius muscle tension and tenderness noted bilaterally.  Limited mobility of the lumbar spine.  Shoulder joints, elbow joints, wrist joints, MCPs, PIPs, DIPs have good range of motion with no synovitis.  Complete fist formation bilaterally.  Tenderness of the DIP of her right index finger noted.  Left knee replacement has slightly limited extension  but no effusion noted.  Right knee joint has good range of motion with no warmth or effusion.  Ankle joints have good range of motion with no tenderness or joint swelling.  CDAI Exam: CDAI Score: -- Patient Global: --; Provider Global: -- Swollen: --; Tender: -- Joint Exam 10/28/2023   No joint exam has been documented for this visit   There is currently no information documented on the homunculus. Go to the Rheumatology activity and complete the homunculus joint exam.  Investigation: No additional findings.  Imaging: No results found.  Recent Labs: Lab Results  Component Value Date   WBC 3.9 09/22/2023   HGB 13.6 09/22/2023   PLT 206 09/22/2023   NA 142 09/22/2023   K 3.8 09/22/2023   CL 107 09/22/2023   CO2 26 09/22/2023   GLUCOSE 96 09/22/2023   BUN 10 09/22/2023   CREATININE 0.60 09/22/2023   BILITOT 0.5 09/22/2023   ALKPHOS 62 03/19/2020   AST 15 09/22/2023   ALT 15 09/22/2023   PROT 7.5 09/22/2023   PROT  7.5 09/22/2023   ALBUMIN 3.9 03/19/2020   CALCIUM  9.9 09/22/2023   GFRAA >60 12/01/2019   QFTBGOLDPLUS NEGATIVE 04/17/2022    Speciality Comments: No specialty comments available.  Procedures:  No procedures performed Allergies: Hydrochlorothiazide , Lipitor [atorvastatin ], Penicillins, and Orange juice [orange oil]     Assessment / Plan:     Visit Diagnoses: Sjogren's syndrome with keratoconjunctivitis sicca (HCC) - Positive ANA, positive SSA, sicca symptoms, arthralgias: Patient continues to have chronic sicca symptoms especially mouth dryness at night.  Discussed the use of a humidifier in her bedroom as well as XyliMelts available over-the-counter.  She has been taking Imuran  50 mg 1 tablet by mouth daily.  She is been tolerating Imuran  without any side effects and has not had any recent gaps in therapy.  Patient presents today experiencing increased generalized pain consistent with a fibromyalgia flare.  No active synovitis was noted on examination.  Sed rate was within normal limits on 09/22/2023.  Patient is unsure if Imuran  is as effective as it was when she initially started therapy. Discussed the option of increasing imuran  to 75 mg daily as a trial but she would like to hold off at this time.  She plans on initiating Lyrica  prescribed by her PCP to alleviate pain secondary to fibromyalgia.   Lab work from 09/22/23 was reviewed today in the office: ANA 1: 320 NH, 1: 40 cytoplasmic, RF negative, La antibody negative, Ro antibody positive, ESR within normal limits, urine protein creatinine ratio within normal limits, SPEP normal, complements within normal limits.  Future orders for the following lab work were placed today.  She will return for updated lab work in July 2025.  Patient remain on Imuran  50 mg 1 tablet by mouth daily.  She will notify us  if she develops any new or worsening symptoms.  She will follow-up in the office in 3 months or sooner if needed.  - Plan: Protein / creatinine  ratio, urine, CBC with Differential/Platelet, Comprehensive metabolic panel with GFR, Sedimentation rate, C3 and C4, C-reactive protein  Inflammatory arthritis - Anti-CCP and rheumatoid factor negative on 10/14/2021.  Ultrasound of both hands was positive for synovitis on 02/12/2022. Family history of rheumatoid arthritis: Patient presents today with increased generalized myalgias and arthralgias.  No obvious synovitis was noted on examination today.  She has been taking Imuran  50 mg 1 tablet daily without interruption.  ESR was within normal limits on 09/22/2023.  Her current  symptoms seem consistent with a fibromyalgia flare.  Patient plans on initiating Lyrica  50 mg 1 capsule twice daily prescribed by her PCP. Plan to check sed rate and CRP with her next lab work.  If she continues to have persistent pain or increased inflammation in her hands we discussed the option of increasing Imuran  to 75 mg daily. She will follow-up in the office in 3 months or sooner if needed.  - Plan: Sedimentation rate, C-reactive protein  High risk medication use - Imuran  50 mg 1 tablet by mouth daily.   Concern for early ILD and concern for hepatorenal toxicity--not be a good candidate for methotrexate . CBC and CMP WNL on 09/22/23.  Her next lab work will be due in July and every 3 months to monitor for drug toxicity. No recent or recurrent infections.  - Plan: CBC with Differential/Platelet, Comprehensive metabolic panel with GFR  Primary osteoarthritis of both hands: Patient continues to experience intermittent aching and stiffness involving both hands.  She has had some increased difficulty with fine motor and grip due to the discomfort.  On examination today no obvious synovitis was noted.  Sed rate was within normal limits on 09/22/2023. Plan to recheck sed rate and CRP with next lab work.  Paresthesia of both hands: Intermittent   Trochanteric bursitis of both hips: Patient continues to experience discomfort on the  lateral aspect of both hips consistent with Dr. Rayann Cage.  Encouraged the patient to perform stretching exercises daily.  Status post left knee replacement - Partial-06/03/22 Performed by Dr. Joya Nissen well.  No effusion noted.   Primary osteoarthritis of right knee: Good range of motion of the right knee.  No warmth or effusion noted.  Degeneration of intervertebral disc of lumbar region with discogenic back pain: Chronic pain.  She is not ready to proceed with surgical intervention at this time.    Fibromyalgia: She has generalized hyperalgesia and positive tender points on exam.  Patient presents today experiencing a fibromyalgia flare.  She has been under increased stress recently which she feels is contributing to the flare.  She is also had to discontinue duloxetine  due to an increase in palpitations.  Her primary care recommended initiating Lyrica  50 mg 1 capsule twice daily but she has not yet picked up the prescription from the pharmacy.  Patient plans on initiating Lyrica  50 mg 1 capsule by mouth twice daily. She also plans on continuing her current exercise regimen and would like to continue to try to work on weight loss.  Other medical conditions are listed as follows:  Anxiety and depression: Discontinued duloxetine  due to palpitations.  Adjustment insomnia  Frequent headaches  Pure hypertriglyceridemia  Hypermobility of joint  Gastroesophageal reflux disease without esophagitis  Seasonal allergies  Family history of rheumatoid arthritis- two sisters and nephew  History of diabetes mellitus  Osteoporosis screening - DEXA updated on 07/29/22:  DualFemur Neck Left is 0.978 T-score of -0.4. Repeat DEXA in 2 years due to history of prednisone  use and recent right foot fracture.  Orders: Orders Placed This Encounter  Procedures   Protein / creatinine ratio, urine   CBC with Differential/Platelet   Comprehensive metabolic panel with GFR   Sedimentation rate   C3 and  C4   C-reactive protein   No orders of the defined types were placed in this encounter.   Follow-Up Instructions: Return in 3 months (on 01/28/2024) for Sjogren's syndrome, Inflammatory arthritis .   Romayne Clubs, PA-C  Note - This record has been  created using [employer].  Chart creation errors have been sought, but may not always  have been located. Such creation errors do not reflect on  the standard of medical care.

## 2023-10-15 ENCOUNTER — Telehealth (INDEPENDENT_AMBULATORY_CARE_PROVIDER_SITE_OTHER): Admitting: Internal Medicine

## 2023-10-15 ENCOUNTER — Encounter: Payer: Self-pay | Admitting: Internal Medicine

## 2023-10-15 DIAGNOSIS — M5136 Other intervertebral disc degeneration, lumbar region with discogenic back pain only: Secondary | ICD-10-CM | POA: Diagnosis not present

## 2023-10-15 DIAGNOSIS — M797 Fibromyalgia: Secondary | ICD-10-CM | POA: Diagnosis not present

## 2023-10-15 MED ORDER — PREGABALIN 50 MG PO CAPS: 50.0000 mg | ORAL_CAPSULE | Freq: Two times a day (BID) | ORAL | 0 refills | Status: DC

## 2023-10-15 NOTE — Patient Instructions (Signed)
Myofascial Pain Syndrome and Fibromyalgia Myofascial pain syndrome and fibromyalgia are both pain disorders. You may feel this pain mainly in your muscles. Myofascial pain syndrome: Always has tender points in the muscles that will cause pain when pressed (trigger points). The pain may come and go. Usually affects your neck, upper back, and shoulder areas. The pain often moves into your arms and hands. Fibromyalgia: Has muscle pains and tenderness that come and go. Is often associated with tiredness (fatigue) and sleep problems. Has trigger points. Tends to be long-lasting (chronic), but is not life-threatening. Fibromyalgia and myofascial pain syndrome are not the same. However, they often occur together. If you have both conditions, each can make the other worse. Both are common and can cause enough pain and fatigue to make day-to-day activities difficult. Both can be hard to diagnose because their symptoms are common in many other conditions. What are the causes? The exact causes of these conditions are not known. What increases the risk? You are more likely to develop either of these conditions if: You have a family history of the condition. You are female. You have certain triggers, such as: Spine disorders. An injury (trauma) or other physical stressors. Being under a lot of stress. Medical conditions such as osteoarthritis, rheumatoid arthritis, or lupus. What are the signs or symptoms? Fibromyalgia The main symptom of fibromyalgia is widespread pain and tenderness in your muscles. Pain is sometimes described as stabbing, shooting, or burning. You may also have: Tingling or numbness. Sleep problems and fatigue. Problems with attention and concentration (fibro fog). Other symptoms may include: Bowel and bladder problems. Headaches. Vision problems. Sensitivity to odors and noises. Depression or mood changes. Painful menstrual periods (dysmenorrhea). Dry skin or eyes. These  symptoms can vary over time. Myofascial pain syndrome Symptoms of myofascial pain syndrome include: Tight, ropy bands of muscle. Uncomfortable sensations in muscle areas. These may include aching, cramping, burning, numbness, tingling, and weakness. Difficulty moving certain parts of the body freely (poor range of motion). How is this diagnosed? This condition may be diagnosed by your symptoms and medical history. You will also have a physical exam. In general: Fibromyalgia is diagnosed if you have pain, fatigue, and other symptoms for more than 3 months, and symptoms cannot be explained by another condition. Myofascial pain syndrome is diagnosed if you have trigger points in your muscles, and those trigger points are tender and cause pain elsewhere in your body (referred pain). How is this treated? Treatment for these conditions depends on the type that you have. For fibromyalgia, a healthy lifestyle is the most important treatment including aerobic and strength exercises. Different types of medicines are used to help treat pain and include: NSAIDs. Medicines for treating depression. Medicines that help control seizures. Medicines that relax the muscles. Treatment for myofascial pain syndrome includes: Pain medicines, such as NSAIDs. Cooling and stretching of muscles. Massage therapy with myofascial release technique. Trigger point injections. Treating these conditions often requires a team of health care providers. These may include: Your primary care provider. A physical therapist. Complementary health care providers, such as massage therapists or acupuncturists. A psychiatrist for cognitive behavioral therapy. Follow these instructions at home: Medicines Take over-the-counter and prescription medicines only as told by your health care provider. Ask your health care provider if the medicine prescribed to you: Requires you to avoid driving or using machinery. Can cause constipation.  You may need to take these actions to prevent or treat constipation: Drink enough fluid to keep your urine pale   yellow. Take over-the-counter or prescription medicines. Eat foods that are high in fiber, such as beans, whole grains, and fresh fruits and vegetables. Limit foods that are high in fat and processed sugars, such as fried or sweet foods. Lifestyle  Do exercises as told by your health care provider or physical therapist. Practice relaxation techniques to control your stress. You may want to try: Biofeedback. Visual imagery. Hypnosis. Muscle relaxation. Yoga. Meditation. Maintain a healthy lifestyle. This includes eating a healthy diet and getting enough sleep. Do not use any products that contain nicotine or tobacco. These products include cigarettes, chewing tobacco, and vaping devices, such as e-cigarettes. If you need help quitting, ask your health care provider. General instructions Talk to your health care provider about complementary treatments, such as acupuncture or massage. Do not do activities that stress or strain your muscles. This includes repetitive motions and heavy lifting. Keep all follow-up visits. This is important. Where to find support Consider joining a support group with others who are diagnosed with this condition. National Fibromyalgia Association: fmaware.org Where to find more information U.S. Pain Foundation: uspainfoundation.org Contact a health care provider if: You have new symptoms. Your symptoms get worse or your pain is severe. You have side effects from your medicines. You have trouble sleeping. Your condition is causing depression or anxiety. Get help right away if: You have thoughts of hurting yourself or others. Get help right away if you feel like you may hurt yourself or others, or have thoughts about taking your own life. Go to your nearest emergency room or: Call 911. Call the National Suicide Prevention Lifeline at 1-800-273-8255  or 988. This is open 24 hours a day. Text the Crisis Text Line at 741741. This information is not intended to replace advice given to you by your health care provider. Make sure you discuss any questions you have with your health care provider. Document Revised: 03/10/2022 Document Reviewed: 05/03/2021 Elsevier Patient Education  2024 Elsevier Inc.  

## 2023-10-15 NOTE — Progress Notes (Signed)
 Virtual Visit via Video Note  I connected with [name] on 10/15/23 at  2:00 PM EDT by a video enabled telemedicine application and verified that I am speaking with the correct person using two identifiers.  Location: Patient: Home Provider: Office  Person's participating in this video call: Helayne Lo, NP-C and Akaysha Hancock   I discussed the limitations of evaluation and management by telemedicine and the availability of in person appointments. The patient expressed understanding and agreed to proceed.  History of Present Illness:  Discussed the use of AI scribe software for clinical note transcription with the patient, who gave verbal consent to proceed.   Alice Kennemore Authement "[name]" is a 58 year old female with fibromyalgia and chronic back pain who presents with worsening pain concerning for fibromyalgia flare-up.  She experiences significant pain and flare-ups related to her fibromyalgia. Despite limiting her activities to two to three hours a day, she continues to have flare-ups. The pain is persistent, and current treatments are not providing adequate relief.  She is currently taking Tylenol , tramadol , and meloxicam  for pain management. She previously tried gabapentin but discontinued it due to side effects, including hair loss and possible heart palpitations. Duloxetine  was also discontinued due to frequent heart palpitations, which she suspects were caused by the medication.  She has been losing weight and is currently around 232-233 pounds. Her physical activity routine includes walking and some jogging. She has not been doing stretching, yoga, or Pilates.  She is in the process of obtaining disability benefits, which have been approved but are currently under a quality review. This process could take up to sixty days.        Past Medical History:  Diagnosis Date   Allergy    Anemia    h/o   Anxiety    DDD (degenerative disc disease), lumbar    Depression     Diabetes mellitus, type 2 (HCC)    Fibromyalgia    GERD (gastroesophageal reflux disease)    Morbid obesity (HCC)    Pneumonia 01/2022   PONV (postoperative nausea and vomiting)    Pre-diabetes    Sjogren's syndrome (HCC)     Current Outpatient Medications  Medication Sig Dispense Refill   acetaminophen  (TYLENOL  8 HOUR ARTHRITIS PAIN) 650 MG CR tablet Take 1,300 mg by mouth daily.     albuterol  (VENTOLIN  HFA) 108 (90 Base) MCG/ACT inhaler TAKE 2 PUFFS BY MOUTH EVERY 6 HOURS AS NEEDED FOR WHEEZE OR SHORTNESS OF BREATH 8.5 each 2   azaTHIOprine  (IMURAN ) 50 MG tablet TAKE 1 TABLET BY MOUTH EVERY DAY 30 tablet 2   Blood Glucose Monitoring Suppl DEVI 1 each by Does not apply route in the morning, at noon, and at bedtime. May substitute to any manufacturer covered by patient's insurance. 1 each 3   Blood Glucose Monitoring Suppl DEVI 1 each by Does not apply route in the morning, at noon, and at bedtime. May substitute to any manufacturer covered by patient's insurance. 1 each 0   Cinnamon-Chromium-Biotin ([employer] CINNAMON COMPLEX PO) Take by mouth.     Continuous Glucose Sensor (DEXCOM G7 SENSOR) MISC 1 Device by Does not apply route every 14 (fourteen) days. 6 each 1   DULoxetine  HCl 20 MG CSDR Take 20 mg by mouth daily.     eszopiclone  (LUNESTA ) 2 MG TABS tablet Take 1 tablet (2 mg total) by mouth at bedtime as needed for sleep. Take immediately before bedtime 30 tablet 0   famotidine  (PEPCID ) 20 MG  tablet TAKE 1 TABLET (20 MG TOTAL) BY MOUTH 2 (TWO) TIMES DAILY AS NEEDED FOR HEARTBURN OR INDIGESTION. 180 tablet 0   fexofenadine -pseudoephedrine (ALLEGRA-D 24) 180-240 MG 24 hr tablet Take 1 tablet by mouth daily. 180 tablet 0   fluticasone  (FLONASE  ALLERGY RELIEF) 50 MCG/ACT nasal spray      glucose blood test strip Use 1-3 times daily as needed DX E11.65 100 each 12   hydrocortisone  (ANUSOL -HC) 25 MG suppository Place 1 suppository (25 mg total) rectally at bedtime. 30 suppository 0    hydrocortisone -pramoxine (PROCTOFOAM-HC) rectal foam Place 1 applicator rectally 2 (two) times daily. 10 g 0   hydrOXYzine  (VISTARIL ) 25 MG capsule TAKE 1 CAPSULE (25 MG TOTAL) BY MOUTH DAILY AS NEEDED. 90 capsule 1   ibuprofen  (ADVIL ) 800 MG tablet Take 1 tablet (800 mg total) by mouth daily as needed. 30 tablet 0   meloxicam  (MOBIC ) 7.5 MG tablet Take 7.5-15 mg by mouth daily.     methocarbamol  (ROBAXIN ) 500 MG tablet Take 1 tablet (500 mg total) by mouth 3 (three) times daily as needed. 90 tablet 1   Multiple Vitamin (MULTIVITAMIN PO) Take by mouth daily.     Na Sulfate-K Sulfate-Mg Sulfate concentrate (SUPREP) 17.5-3.13-1.6 GM/177ML SOLN Use as directed; may use generic; goodrx card if insurance will not cover generic 354 mL 0   pantoprazole  (PROTONIX ) 40 MG tablet Take 1 tablet (40 mg total) by mouth daily. 90 tablet 1   phenylephrine  (,USE FOR PREPARATION-H,) 0.25 % suppository Place 1 suppository rectally 2 (two) times daily. 12 suppository 0   rosuvastatin  (CRESTOR ) 5 MG tablet Take 1 tablet (5 mg total) by mouth 3 (three) times a week. 45 tablet 1   traMADol  (ULTRAM ) 50 MG tablet Take 1 tablet (50 mg total) by mouth every 6 (six) hours as needed. 30 tablet 0   VITAMIN D  PO Take 1 tablet by mouth 2 (two) times a week.     No current facility-administered medications for this visit.    Allergies  Allergen Reactions   Hydrochlorothiazide  Other (See Comments)    The pt state she couldn't function and felt like she was dying    Lipitor [Atorvastatin ]     Muscle pain   Penicillins Other (See Comments)    Childhood allergy Has patient had a PCN reaction causing immediate rash, facial/tongue/throat swelling, SOB or lightheadedness with hypotension: Unknown Has patient had a PCN reaction causing severe rash involving mucus membranes or skin necrosis: Unknown Has patient had a PCN reaction that required hospitalization: No  Has patient had a PCN reaction occurring within the last 10 years:  No  If all of the above answers are "NO", then may proceed with Cephalosporin use.    Orange Juice [Orange Oil] Rash    rash    Family History  Problem Relation Age of Onset   Hyperlipidemia Mother    Rheum arthritis Sister    Osteoarthritis Sister    Fibroids Sister    Fibroids Sister    Fibroids Sister    Heart disease Maternal Aunt    Stroke Maternal Aunt    Diabetes Maternal Aunt    Heart disease Maternal Uncle    Stroke Maternal Uncle    Rheum arthritis Nephew    Cancer Neg Hx     Social History   Socioeconomic History   Marital status: Married    Spouse name: Not on file   Number of children: 2   Years of education: Not on file  Highest education level: Some college, no degree  Occupational History   Occupation: retail  Tobacco Use   Smoking status: Never    Passive exposure: Current   Smokeless tobacco: Never  Vaping Use   Vaping status: Never Used  Substance and Sexual Activity   Alcohol use: Yes    Comment: occassionally   Drug use: Not Currently    Types: Marijuana   Sexual activity: Yes    Partners: Male    Birth control/protection: Post-menopausal, Surgical  Other Topics Concern   Not on file  Social History Narrative   Not on file   Social Drivers of Health   Financial Resource Strain: Medium Risk (09/21/2023)   Overall Financial Resource Strain (CARDIA)    Difficulty of Paying Living Expenses: Somewhat hard  Food Insecurity: Food Insecurity Present (09/21/2023)   Hunger Vital Sign    Worried About Running Out of Food in the Last Year: Sometimes true    Ran Out of Food in the Last Year: Sometimes true  Transportation Needs: No Transportation Needs (09/21/2023)   PRAPARE - Administrator, Civil Service (Medical): No    Lack of Transportation (Non-Medical): No  Physical Activity: Insufficiently Active (09/21/2023)   Exercise Vital Sign    Days of Exercise per Week: 2 days    Minutes of Exercise per Session: 20 min  Stress: Stress  Concern Present (09/21/2023)   [employer] of Occupational Health - Occupational Stress Questionnaire    Feeling of Stress : Rather much  Social Connections: Unknown (09/21/2023)   Social Connection and Isolation Panel [NHANES]    Frequency of Communication with Friends and Family: Three times a week    Frequency of Social Gatherings with Friends and Family: More than three times a week    Attends Religious Services: Patient declined    Active Member of Clubs or Organizations: No    Attends Engineer, structural: Not on file    Marital Status: Married  Catering manager Violence: Not At Risk (06/03/2022)   Humiliation, Afraid, Rape, and Kick questionnaire    Fear of Current or Ex-Partner: No    Emotionally Abused: No    Physically Abused: No    Sexually Abused: No     Constitutional: Patient reports intermittent headaches.  Denies fever, malaise, fatigue, or abrupt weight changes.  Respiratory: Denies difficulty breathing, shortness of breath, cough or sputum production.   Cardiovascular: Pt reports palpitations. Denies chest pain, chest tightness, or swelling in the hands or feet.  Musculoskeletal: Patient reports chronic joint and muscle pain.  Denies decrease in range of motion, difficulty with gait, or joint swelling.  Skin: Denies redness, rashes, lesions or ulcercations.  Neurological: Patient reports insomnia.  Denies dizziness, difficulty with memory, difficulty with speech or problems with balance and coordination.  Psych: Patient has a history of anxiety and depression.  Denies SI/HI.  No other specific complaints in a complete review of systems (except as listed in HPI above).  Observations/Objective:   Wt Readings from Last 3 Encounters:  10/06/23 234 lb (106.1 kg)  10/05/23 234 lb 12.8 oz (106.5 kg)  09/22/23 238 lb 6.4 oz (108.1 kg)    General: Appears her stated age, obese, in NAD. Pulmonary/Chest: Normal effort. No respiratory distress.   Musculoskeletal: Gait slow and steady without device. Neurological: Alert and oriented.  Psychiatric: Mood and affect normal. Behavior is normal. Judgment and thought content normal.     BMET    Component Value Date/Time   NA  142 09/22/2023 1427   NA 142 09/18/2016 0929   K 3.8 09/22/2023 1427   CL 107 09/22/2023 1427   CO2 26 09/22/2023 1427   GLUCOSE 96 09/22/2023 1427   BUN 10 09/22/2023 1427   BUN 9 09/18/2016 0929   CREATININE 0.60 09/22/2023 1427   CALCIUM  9.9 09/22/2023 1427   GFRNONAA >60 01/15/2022 2334   GFRAA >60 12/01/2019 1558    Lipid Panel     Component Value Date/Time   CHOL 228 (H) 09/22/2023 1427   CHOL 200 (H) 03/23/2017 0844   TRIG 125 09/22/2023 1427   HDL 67 09/22/2023 1427   HDL 57 03/23/2017 0844   CHOLHDL 3.4 09/22/2023 1427   VLDL 36.2 03/19/2020 0918   LDLCALC 136 (H) 09/22/2023 1427    CBC    Component Value Date/Time   WBC 3.9 09/22/2023 1427   RBC 4.28 09/22/2023 1427   HGB 13.6 09/22/2023 1427   HGB 13.2 09/18/2016 0929   HCT 40.4 09/22/2023 1427   HCT 38.1 09/18/2016 0929   PLT 206 09/22/2023 1427   PLT 268 09/18/2016 0929   MCV 94.4 09/22/2023 1427   MCV 94 09/18/2016 0929   MCH 31.8 09/22/2023 1427   MCHC 33.7 09/22/2023 1427   RDW 13.1 09/22/2023 1427   RDW 12.8 09/18/2016 0929   LYMPHSABS 1,357 03/23/2023 1038   LYMPHSABS 2.8 08/03/2014 1609   MONOABS 0.6 08/30/2019 0315   EOSABS 80 03/23/2023 1038   EOSABS 0.1 08/03/2014 1609   BASOSABS 21 03/23/2023 1038   BASOSABS 0.0 08/03/2014 1609    Hgb A1C Lab Results  Component Value Date   HGBA1C 6.0 (H) 09/22/2023       Assessment and Plan:  Assessment and Plan    Fibromyalgia, Chronic Back Pain Chronic fibromyalgia with recent exacerbations. Current regimen insufficient for pain control. Previous gabapentin and duloxetine  discontinued due to adverse effects. - Initiate pregabalin  50 mg twice daily. Monitor efficacy and side effects, especially heart  palpitations. - Continue acetaminophen , tramadol , meloxicam , and methocarbamol . - Advise against restarting duloxetine  to assess pregabalin 's effects. - Encourage stretching, yoga, or Pilates to manage symptoms.       Schedule an appointment for your annual  Follow Up Instructions:    I discussed the assessment and treatment plan with the patient. The patient was provided an opportunity to ask questions and all were answered. The patient agreed with the plan and demonstrated an understanding of the instructions.   The patient was advised to call back or seek an in-person evaluation if the symptoms worsen or if the condition fails to improve as anticipated.   Helayne Lo, NP

## 2023-10-21 ENCOUNTER — Other Ambulatory Visit: Payer: Self-pay | Admitting: Physician Assistant

## 2023-10-21 NOTE — Telephone Encounter (Signed)
 Last Fill: 06/07/2024  Labs: 09/22/2023 CBC and CMP WNL  Next Visit: 10/28/2023  Last Visit: 06/30/2023  DX: Sjogren's syndrome with keratoconjunctivitis sicca   Current Dose per office note 06/30/2023: Imuran  50 mg 1 tablet by mouth daily   Okay to refill Imuran ?

## 2023-10-22 ENCOUNTER — Other Ambulatory Visit

## 2023-10-28 ENCOUNTER — Ambulatory Visit: Attending: Physician Assistant | Admitting: Physician Assistant

## 2023-10-28 ENCOUNTER — Encounter: Payer: Self-pay | Admitting: Physician Assistant

## 2023-10-28 ENCOUNTER — Encounter: Payer: Self-pay | Admitting: Podiatry

## 2023-10-28 VITALS — BP 148/89 | HR 86 | Resp 14 | Ht 65.5 in | Wt 233.4 lb

## 2023-10-28 DIAGNOSIS — M19041 Primary osteoarthritis, right hand: Secondary | ICD-10-CM | POA: Diagnosis not present

## 2023-10-28 DIAGNOSIS — M1990 Unspecified osteoarthritis, unspecified site: Secondary | ICD-10-CM | POA: Diagnosis not present

## 2023-10-28 DIAGNOSIS — M1711 Unilateral primary osteoarthritis, right knee: Secondary | ICD-10-CM

## 2023-10-28 DIAGNOSIS — M3501 Sicca syndrome with keratoconjunctivitis: Secondary | ICD-10-CM | POA: Diagnosis not present

## 2023-10-28 DIAGNOSIS — J302 Other seasonal allergic rhinitis: Secondary | ICD-10-CM | POA: Diagnosis present

## 2023-10-28 DIAGNOSIS — F32A Depression, unspecified: Secondary | ICD-10-CM

## 2023-10-28 DIAGNOSIS — M19042 Primary osteoarthritis, left hand: Secondary | ICD-10-CM

## 2023-10-28 DIAGNOSIS — Z13820 Encounter for screening for osteoporosis: Secondary | ICD-10-CM

## 2023-10-28 DIAGNOSIS — Z79899 Other long term (current) drug therapy: Secondary | ICD-10-CM | POA: Diagnosis not present

## 2023-10-28 DIAGNOSIS — F5102 Adjustment insomnia: Secondary | ICD-10-CM

## 2023-10-28 DIAGNOSIS — R202 Paresthesia of skin: Secondary | ICD-10-CM

## 2023-10-28 DIAGNOSIS — K219 Gastro-esophageal reflux disease without esophagitis: Secondary | ICD-10-CM | POA: Diagnosis present

## 2023-10-28 DIAGNOSIS — E781 Pure hyperglyceridemia: Secondary | ICD-10-CM | POA: Diagnosis present

## 2023-10-28 DIAGNOSIS — R519 Headache, unspecified: Secondary | ICD-10-CM

## 2023-10-28 DIAGNOSIS — F419 Anxiety disorder, unspecified: Secondary | ICD-10-CM

## 2023-10-28 DIAGNOSIS — Z96652 Presence of left artificial knee joint: Secondary | ICD-10-CM | POA: Diagnosis not present

## 2023-10-28 DIAGNOSIS — M249 Joint derangement, unspecified: Secondary | ICD-10-CM | POA: Diagnosis present

## 2023-10-28 DIAGNOSIS — M5136 Other intervertebral disc degeneration, lumbar region with discogenic back pain only: Secondary | ICD-10-CM | POA: Diagnosis not present

## 2023-10-28 DIAGNOSIS — Z8261 Family history of arthritis: Secondary | ICD-10-CM

## 2023-10-28 DIAGNOSIS — Z8639 Personal history of other endocrine, nutritional and metabolic disease: Secondary | ICD-10-CM | POA: Diagnosis present

## 2023-10-28 DIAGNOSIS — M797 Fibromyalgia: Secondary | ICD-10-CM

## 2023-10-28 DIAGNOSIS — M7061 Trochanteric bursitis, right hip: Secondary | ICD-10-CM

## 2023-10-28 DIAGNOSIS — M7062 Trochanteric bursitis, left hip: Secondary | ICD-10-CM

## 2023-10-28 NOTE — Patient Instructions (Signed)
 Standing Labs We placed an order today for your standing lab work.   Please have your standing labs drawn in July and every 3 months   Please have your labs drawn 2 weeks prior to your appointment so that the provider can discuss your lab results at your appointment, if possible.  Please note that you may see your imaging and lab results in MyChart before we have reviewed them. We will contact you once all results are reviewed. Please allow our office up to 72 hours to thoroughly review all of the results before contacting the office for clarification of your results.  WALK-IN LAB HOURS  Monday through Thursday from 8:00 am -12:30 pm and 1:00 pm-4:00 pm and Friday from 8:00 am-12:00 pm.  Patients with office visits requiring labs will be seen before walk-in labs.  You may encounter longer than normal wait times. Please allow additional time. Wait times may be shorter on  Monday and Thursday afternoons.  We do not book appointments for walk-in labs. We appreciate your patience and understanding with our staff.   Labs are drawn by [facility]. Please bring your co-pay at the time of your lab draw.  You may receive a bill from [facility] for your lab work.  Please note if you are on Hydroxychloroquine and and an order has been placed for a Hydroxychloroquine level,  you will need to have it drawn 4 hours or more after your last dose.  If you wish to have your labs drawn at another location, please call the office 24 hours in advance so we can fax the orders.  The office is located at [address], [address], [place], [place] [address]   If you have any questions regarding directions or hours of operation,  please call [phone].   As a reminder, please drink plenty of water  prior to coming for your lab work. Thanks!

## 2023-11-03 NOTE — Progress Notes (Signed)
 [facility] History and Physical   Primary Care Physician:  Carollynn Cirri, NP   Reason for Procedure:  History of adenomatous colon polyps, external hemorrhoids  Plan:    Surveillance colonoscopy     HPI: [name] is a 58 y.o. female undergoing surveillance colonoscopy for a history of adenomatous colon polyps and recent anorectal irritation secondary to external hemorrhoids.  Patient underwent colonoscopy in 2021 which disclosed 4 polyps-tubular adenoma and hyperplastic polyp.  1 tubular adenoma measured 10 mm.  She was advised to have a 3-year follow-up.  No family history of colorectal cancer or polyps.  Recently seen in the office for anorectal pain with associated stinging and burning.  Noted to have an ulcerated hemorrhoid on physical exam likely related to prior thrombosis.  Endorses symptoms of frequent constipation.  Uses stool softeners.   Past Medical History:  Diagnosis Date   Allergy    Anemia    h/o   Anxiety    DDD (degenerative disc disease), lumbar    Depression    Diabetes mellitus, type 2 (HCC)    Fibromyalgia    GERD (gastroesophageal reflux disease)    Morbid obesity (HCC)    Pneumonia 01/2022   PONV (postoperative nausea and vomiting)    Pre-diabetes    Sjogren's syndrome (HCC)     Past Surgical History:  Procedure Laterality Date   ABDOMINAL HYSTERECTOMY  2008   LAVH, endometriosis ablation, mccall culdoplasty   CHOLECYSTECTOMY N/A 08/31/2019   Procedure: LAPAROSCOPIC CHOLECYSTECTOMY;  Surgeon: Emmalene Hare, MD;  Location: [facility];  Service: General;  Laterality: N/A;   COLONOSCOPY  2022   DIAGNOSTIC LAPAROSCOPY  2002   Hysteroscopy, diagnostic laparoscopy, extensive lysis of adhesions, endometrial ablation, RSO   RIGHT OOPHORECTOMY  2007   with right salpingectomy   TOTAL KNEE ARTHROPLASTY 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   Procedure: Partial KNEE ARTHROPLASTY;  Surgeon: Jerlyn Moons, MD;  Location: [facility];  Service: Orthopedics;   Laterality: Left;   TUBAL LIGATION      Prior to Admission medications   Medication Sig Start Date End Date Taking? Authorizing Provider  acetaminophen  (TYLENOL  8 HOUR ARTHRITIS PAIN) 650 MG CR tablet Take 1,300 mg by mouth daily.    [provider]  albuterol  (VENTOLIN  HFA) 108 (90 Base) MCG/ACT inhaler TAKE 2 PUFFS BY MOUTH EVERY 6 HOURS AS NEEDED FOR WHEEZE OR SHORTNESS OF BREATH 12/16/22   Carollynn Cirri, NP  azaTHIOprine  (IMURAN ) 50 MG tablet TAKE 1 TABLET BY MOUTH EVERY DAY 10/21/23   Nicholas Bari, MD  Blood Glucose Monitoring Suppl DEVI 1 each by Does not apply route in the morning, at noon, and at bedtime. May substitute to any manufacturer covered by patient's insurance. 04/13/23   Carollynn Cirri, NP  Blood Glucose Monitoring Suppl DEVI 1 each by Does not apply route in the morning, at noon, and at bedtime. May substitute to any manufacturer covered by patient's insurance. Patient not taking: Reported on 10/28/2023 07/06/23   Carollynn Cirri, NP  Cinnamon-Chromium-Biotin ([facility] CINNAMON COMPLEX PO) Take by mouth.    [provider]  Continuous Glucose Sensor (DEXCOM G7 SENSOR) MISC 1 Device by Does not apply route every 14 (fourteen) days. Patient not taking: Reported on 10/28/2023 06/23/23   Carollynn Cirri, NP  DULoxetine  HCl 20 MG CSDR Take 20 mg by mouth daily. Patient not taking: Reported on 10/28/2023    [provider]  eszopiclone  (LUNESTA ) 2 MG TABS tablet Take 1 tablet (2 mg total)  by mouth at bedtime as needed for sleep. Take immediately before bedtime 04/10/23   Carollynn Cirri, NP  famotidine  (PEPCID ) 20 MG tablet TAKE 1 TABLET (20 MG TOTAL) BY MOUTH 2 (TWO) TIMES DAILY AS NEEDED FOR HEARTBURN OR INDIGESTION. 10/13/22   Carollynn Cirri, NP  fexofenadine -pseudoephedrine (ALLEGRA-D 24) 180-240 MG 24 hr tablet Take 1 tablet by mouth daily. 07/13/23   Carollynn Cirri, NP  fluticasone  (FLONASE  ALLERGY RELIEF) 50 MCG/ACT nasal spray  04/08/23   [provider]  glucose blood test strip Use 1-3 times daily as needed DX E11.65 04/14/23   Carollynn Cirri, NP  hydrocortisone  (ANUSOL -HC) 25 MG suppository Place 1 suppository (25 mg total) rectally at bedtime. Patient not taking: Reported on 10/28/2023 10/06/23   Truddie Furrow, MD  hydrocortisone -pramoxine [facility]) rectal foam Place 1 applicator rectally 2 (two) times daily. Patient not taking: Reported on 10/28/2023 09/22/23   Carollynn Cirri, NP  hydrOXYzine  (VISTARIL ) 25 MG capsule TAKE 1 CAPSULE (25 MG TOTAL) BY MOUTH DAILY AS NEEDED. 10/07/23   Carollynn Cirri, NP  ibuprofen  (ADVIL ) 800 MG tablet Take 1 tablet (800 mg total) by mouth daily as needed. 09/26/23   Carollynn Cirri, NP  meloxicam  (MOBIC ) 15 MG tablet Take 15 mg by mouth as needed for pain.    [provider]  meloxicam  (MOBIC ) 7.5 MG tablet Take 7.5-15 mg by mouth daily. 02/19/23   [provider]  methocarbamol  (ROBAXIN ) 500 MG tablet Take 1 tablet (500 mg total) by mouth 3 (three) times daily as needed. 09/22/23   Carollynn Cirri, NP  Multiple Vitamin (MULTIVITAMIN PO) Take by mouth daily.    [provider]  Na Sulfate-K Sulfate-Mg Sulfate concentrate (SUPREP) 17.5-3.13-1.6 GM/177ML SOLN Use as directed; may use generic; goodrx card if insurance will not cover generic Patient not taking: Reported on 10/28/2023 10/06/23   Truddie Furrow, MD  pantoprazole  (PROTONIX ) 40 MG tablet Take 1 tablet (40 mg total) by mouth daily. 04/10/23   Carollynn Cirri, NP  phenylephrine  (,USE FOR PREPARATION-H,) 0.25 % suppository Place 1 suppository rectally 2 (two) times daily. Patient not taking: Reported on 10/28/2023 10/05/23   Carollynn Cirri, NP  pregabalin  (LYRICA ) 50 MG capsule Take 1 capsule (50 mg total) by mouth 2 (two) times daily. Patient not taking: Reported on 10/28/2023 10/15/23   Carollynn Cirri, NP  rosuvastatin  (CRESTOR ) 5 MG tablet Take 1 tablet (5 mg total) by mouth 3 (three) times a week. 09/23/23    Carollynn Cirri, NP  traMADol  (ULTRAM ) 50 MG tablet Take 1 tablet (50 mg total) by mouth every 6 (six) hours as needed. 09/22/23   Carollynn Cirri, NP  VITAMIN D  PO Take 1 tablet by mouth 2 (two) times a week. Patient not taking: Reported on 10/28/2023    [provider]    Current Outpatient Medications  Medication Sig Dispense Refill   acetaminophen  (TYLENOL  8 HOUR ARTHRITIS PAIN) 650 MG CR tablet Take 1,300 mg by mouth daily.     azaTHIOprine  (IMURAN ) 50 MG tablet TAKE 1 TABLET BY MOUTH EVERY DAY 30 tablet 2   Cinnamon-Chromium-Biotin ([employer] CINNAMON COMPLEX PO) Take by mouth.     eszopiclone  (LUNESTA ) 2 MG TABS tablet Take 1 tablet (2 mg total) by mouth at bedtime as needed for sleep. Take immediately before bedtime 30 tablet 0   famotidine  (PEPCID ) 20 MG tablet TAKE 1 TABLET (20 MG TOTAL) BY MOUTH 2 (TWO) TIMES DAILY AS  NEEDED FOR HEARTBURN OR INDIGESTION. 180 tablet 0   fluticasone  (FLONASE  ALLERGY RELIEF) 50 MCG/ACT nasal spray      glucose blood test strip Use 1-3 times daily as needed DX E11.65 100 each 12   hydrOXYzine  (VISTARIL ) 25 MG capsule TAKE 1 CAPSULE (25 MG TOTAL) BY MOUTH DAILY AS NEEDED. 90 capsule 1   ibuprofen  (ADVIL ) 800 MG tablet Take 1 tablet (800 mg total) by mouth daily as needed. 30 tablet 0   meloxicam  (MOBIC ) 15 MG tablet Take 15 mg by mouth as needed for pain.     methocarbamol  (ROBAXIN ) 500 MG tablet Take 1 tablet (500 mg total) by mouth 3 (three) times daily as needed. 90 tablet 1   Multiple Vitamin (MULTIVITAMIN PO) Take by mouth daily.     Na Sulfate-K Sulfate-Mg Sulfate concentrate (SUPREP) 17.5-3.13-1.6 GM/177ML SOLN Use as directed; may use generic; goodrx card if insurance will not cover generic 354 mL 0   pantoprazole  (PROTONIX ) 40 MG tablet Take 1 tablet (40 mg total) by mouth daily. 90 tablet 1   rosuvastatin  (CRESTOR ) 5 MG tablet Take 1 tablet (5 mg total) by mouth 3 (three) times a week. 45 tablet 1   albuterol  (VENTOLIN  HFA) 108 (90 Base)  MCG/ACT inhaler TAKE 2 PUFFS BY MOUTH EVERY 6 HOURS AS NEEDED FOR WHEEZE OR SHORTNESS OF BREATH 8.5 each 2   Blood Glucose Monitoring Suppl DEVI 1 each by Does not apply route in the morning, at noon, and at bedtime. May substitute to any manufacturer covered by patient's insurance. 1 each 3   Blood Glucose Monitoring Suppl DEVI 1 each by Does not apply route in the morning, at noon, and at bedtime. May substitute to any manufacturer covered by patient's insurance. (Patient not taking: Reported on 11/04/2023) 1 each 0   Continuous Glucose Sensor (DEXCOM G7 SENSOR) MISC 1 Device by Does not apply route every 14 (fourteen) days. (Patient not taking: Reported on 11/04/2023) 6 each 1   DULoxetine  HCl 20 MG CSDR Take 20 mg by mouth daily. (Patient not taking: Reported on 11/04/2023)     fexofenadine -pseudoephedrine (ALLEGRA-D 24) 180-240 MG 24 hr tablet Take 1 tablet by mouth daily. 180 tablet 0   hydrocortisone  (ANUSOL -HC) 25 MG suppository Place 1 suppository (25 mg total) rectally at bedtime. (Patient not taking: Reported on 11/04/2023) 30 suppository 0   hydrocortisone -pramoxine (PROCTOFOAM-HC) rectal foam Place 1 applicator rectally 2 (two) times daily. (Patient not taking: Reported on 11/04/2023) 10 g 0   meloxicam  (MOBIC ) 7.5 MG tablet Take 7.5-15 mg by mouth daily.     phenylephrine  (,USE FOR PREPARATION-H,) 0.25 % suppository Place 1 suppository rectally 2 (two) times daily. (Patient not taking: Reported on 11/04/2023) 12 suppository 0   pregabalin  (LYRICA ) 50 MG capsule Take 1 capsule (50 mg total) by mouth 2 (two) times daily. (Patient not taking: Reported on 11/04/2023) 60 capsule 0   traMADol  (ULTRAM ) 50 MG tablet Take 1 tablet (50 mg total) by mouth every 6 (six) hours as needed. 30 tablet 0   VITAMIN D  PO Take 1 tablet by mouth 2 (two) times a week. (Patient not taking: Reported on 11/04/2023)     Current Facility-Administered Medications  Medication Dose Route Frequency Provider Last Rate Last  Admin   0.9 %  sodium chloride  infusion  500 mL Intravenous Once Willetta York M, MD        Allergies as of 11/04/2023 - Review Complete 11/04/2023  Allergen Reaction Noted   Hydrochlorothiazide  Other (See Comments) 03/28/2021  Lipitor [atorvastatin ] Other (See Comments) 02/12/2022   Orange juice [orange oil] Rash 04/06/2020   Penicillins Other (See Comments) 02/27/2012    Family History  Problem Relation Age of Onset   Hyperlipidemia Mother    Rheum arthritis Sister    Osteoarthritis Sister    Fibroids Sister    Fibroids Sister    Fibroids Sister    Heart disease Maternal Aunt    Stroke Maternal Aunt    Diabetes Maternal Aunt    Heart disease Maternal Uncle    Stroke Maternal Uncle    Rheum arthritis Nephew    Cancer Neg Hx     Social History   Socioeconomic History   Marital status: Married    Spouse name: Not on file   Number of children: 2   Years of education: Not on file   Highest education level: Some college, no degree  Occupational History   Occupation: retail  Tobacco Use   Smoking status: Never    Passive exposure: Current   Smokeless tobacco: Never  Vaping Use   Vaping status: Never Used  Substance and Sexual Activity   Alcohol use: Yes    Comment: occassionally   Drug use: Not Currently    Types: Marijuana   Sexual activity: Yes    Partners: Male    Birth control/protection: Post-menopausal, Surgical  Other Topics Concern   Not on file  Social History Narrative   Not on file   Social Drivers of Health   Financial Resource Strain: Medium Risk (09/21/2023)   Overall Financial Resource Strain (CARDIA)    Difficulty of Paying Living Expenses: Somewhat hard  Food Insecurity: Food Insecurity Present (09/21/2023)   Hunger Vital Sign    Worried About Running Out of Food in the Last Year: Sometimes true    Ran Out of Food in the Last Year: Sometimes true  Transportation Needs: No Transportation Needs (09/21/2023)   PRAPARE - Doctor, general practice (Medical): No    Lack of Transportation (Non-Medical): No  Physical Activity: Insufficiently Active (09/21/2023)   Exercise Vital Sign    Days of Exercise per Week: 2 days    Minutes of Exercise per Session: 20 min  Stress: Stress Concern Present (09/21/2023)   [employer] of Occupational Health - Occupational Stress Questionnaire    Feeling of Stress : Rather much  Social Connections: Unknown (09/21/2023)   Social Connection and Isolation Panel [NHANES]    Frequency of Communication with Friends and Family: Three times a week    Frequency of Social Gatherings with Friends and Family: More than three times a week    Attends Religious Services: Patient declined    Active Member of Clubs or Organizations: No    Attends Engineer, structural: Not on file    Marital Status: Married  Catering manager Violence: Not At Risk (06/03/2022)   Humiliation, Afraid, Rape, and Kick questionnaire    Fear of Current or Ex-Partner: No    Emotionally Abused: No    Physically Abused: No    Sexually Abused: No    Review of Systems:  All other review of systems negative except as mentioned in the HPI.  Physical Exam: Vital signs BP (!) 148/90   Pulse 80   Temp 98.1 F (36.7 C) (Temporal)   Resp 19   Ht 5' 5.5" (1.664 m)   Wt 233 lb (105.7 kg)   SpO2 100%   BMI 38.18 kg/m   General:   Alert,  Well-developed, well-nourished, pleasant and cooperative in NAD Airway:  Mallampati 2 Lungs:  Clear throughout to auscultation.   Heart:  Regular rate and rhythm; no murmurs, clicks, rubs,  or gallops. Abdomen:  Soft, nontender and nondistended. Normal bowel sounds.   Neuro/Psych:  Normal mood and affect. A and O x 3  Eugenia Hess, MD [facility]

## 2023-11-04 ENCOUNTER — Encounter: Payer: Self-pay | Admitting: Pediatrics

## 2023-11-04 ENCOUNTER — Ambulatory Visit (AMBULATORY_SURGERY_CENTER): Admitting: Pediatrics

## 2023-11-04 VITALS — BP 132/82 | HR 58 | Temp 98.1°F | Resp 14 | Ht 65.5 in | Wt 233.0 lb

## 2023-11-04 DIAGNOSIS — M797 Fibromyalgia: Secondary | ICD-10-CM | POA: Diagnosis not present

## 2023-11-04 DIAGNOSIS — Z1211 Encounter for screening for malignant neoplasm of colon: Secondary | ICD-10-CM | POA: Diagnosis not present

## 2023-11-04 DIAGNOSIS — Z860100 Personal history of colon polyps, unspecified: Secondary | ICD-10-CM

## 2023-11-04 DIAGNOSIS — K635 Polyp of colon: Secondary | ICD-10-CM | POA: Diagnosis not present

## 2023-11-04 DIAGNOSIS — K644 Residual hemorrhoidal skin tags: Secondary | ICD-10-CM

## 2023-11-04 DIAGNOSIS — E119 Type 2 diabetes mellitus without complications: Secondary | ICD-10-CM | POA: Diagnosis not present

## 2023-11-04 DIAGNOSIS — F419 Anxiety disorder, unspecified: Secondary | ICD-10-CM | POA: Diagnosis not present

## 2023-11-04 DIAGNOSIS — K5730 Diverticulosis of large intestine without perforation or abscess without bleeding: Secondary | ICD-10-CM | POA: Diagnosis not present

## 2023-11-04 DIAGNOSIS — F32A Depression, unspecified: Secondary | ICD-10-CM | POA: Diagnosis not present

## 2023-11-04 DIAGNOSIS — K649 Unspecified hemorrhoids: Secondary | ICD-10-CM

## 2023-11-04 DIAGNOSIS — K648 Other hemorrhoids: Secondary | ICD-10-CM

## 2023-11-04 DIAGNOSIS — Z860101 Personal history of adenomatous and serrated colon polyps: Secondary | ICD-10-CM | POA: Diagnosis not present

## 2023-11-04 MED ORDER — SODIUM CHLORIDE 0.9 % IV SOLN: 500.0000 mL | Freq: Once | INTRAVENOUS | Status: DC

## 2023-11-04 NOTE — Op Note (Signed)
  [facility] Patient Name: [name] Procedure Date: 11/04/2023 1:01 PM MRN: [id number] Endoscopist: Eugenia Hess , MD, [id number] Age: 58 Referring MD:  Date of Birth: 10/28/1965 Gender: Female Account #: [id number] Procedure:                Colonoscopy Indications:              High risk colon cancer surveillance: Personal                            history of adenoma (10 mm or greater in size), Last                            colonoscopy: October 2021, External hemorrhoids Medicines:                Monitored Anesthesia Care Procedure:                Pre-Anesthesia Assessment:                           - Prior to the procedure, a History and Physical                            was performed, and patient medications and                            allergies were reviewed. The patient's tolerance of                            previous anesthesia was also reviewed. The risks                            and benefits of the procedure and the sedation                            options and risks were discussed with the patient.                            All questions were answered, and informed consent                            was obtained. Prior Anticoagulants: The patient has                            taken no anticoagulant or antiplatelet agents. ASA                            Grade Assessment: II - A patient with mild systemic                            disease. After reviewing the risks and benefits,                            the patient was deemed in satisfactory condition to  undergo the procedure.                           After obtaining informed consent, the colonoscope                            was passed under direct vision. Throughout the                            procedure, the patient's blood pressure, pulse, and                            oxygen saturations were monitored continuously. The                            CF [id number]  #[id number] was introduced through the anus                            and advanced to the cecum, identified by                            appendiceal orifice and ileocecal valve. The                            colonoscopy was performed without difficulty. The                            patient tolerated the procedure well. The quality                            of the bowel preparation was good. The terminal                            ileum, ileocecal valve, appendiceal orifice, and                            rectum were photographed. Scope In: 1:35:03 PM Scope Out: 1:50:41 PM Scope Withdrawal Time: 0 hours 11 minutes 45 seconds  Total Procedure Duration: 0 hours 15 minutes 38 seconds  Findings:                 A small skin tags was found on perianal exam. The                            previously seen ulcerated hemorrhoid has resolved                            with only a small skin tag noted.                           The digital rectal exam was normal. Pertinent                            negatives include normal sphincter tone and no  palpable rectal lesions.                           Multiple medium-mouthed and small-mouthed                            diverticula were found in the sigmoid colon,                            descending colon and transverse colon.                           A 5 mm polyp vs. inverted diverticulum was found in                            the sigmoid colon. The polyp was sessile. The polyp                            was removed with a cold snare. Resection and                            retrieval were complete.                           Internal hemorrhoids were found during retroflexion. Complications:            No immediate complications. Estimated blood loss:                            Minimal. Estimated Blood Loss:     Estimated blood loss was minimal. Impression:               - Perianal skin tags found on perianal exam.                            - Diverticulosis in the sigmoid colon, in the                            descending colon and in the transverse colon.                           - One 5 mm polyp vs. inverted diverticulum in the                            sigmoid colon, removed with a cold snare. Resected                            and retrieved.                           - Internal hemorrhoids. Recommendation:           - Discharge patient to home (ambulatory).                           - Await pathology results.                           -  Repeat colonoscopy for surveillance based on                            pathology results.                           - The findings and recommendations were discussed                            with the patient's family.                           - Return to referring physician.                           - Patient has a contact number available for                            emergencies. The signs and symptoms of potential                            delayed complications were discussed with the                            patient. Return to normal activities tomorrow.                            Written discharge instructions were provided to the                            patient. Eugenia Hess, MD 11/04/2023 1:56:18 PM This report has been signed electronically.

## 2023-11-04 NOTE — Progress Notes (Signed)
 Called to room to assist during endoscopic procedure.  Patient ID and intended procedure confirmed with present staff. Received instructions for my participation in the procedure from the performing physician.

## 2023-11-04 NOTE — Progress Notes (Signed)
 Sedate, gd SR, tolerated procedure well, VSS, report to RN

## 2023-11-04 NOTE — Patient Instructions (Signed)
-  Handout on polyps, diverticulosis and hemorrhoids provided. -await pathology results. -repeat colonoscopy for surveillance recommended. Date to be determined when pathology result become available.   YOU HAD AN ENDOSCOPIC PROCEDURE TODAY AT THE [facility]:   Refer to the procedure report that was given to you for any specific questions about what was found during the examination.  If the procedure report does not answer your questions, please call your gastroenterologist to clarify.  If you requested that your care partner not be given the details of your procedure findings, then the procedure report has been included in a sealed envelope for you to review at your convenience later.  YOU SHOULD EXPECT: Some feelings of bloating in the abdomen. Passage of more gas than usual.  Walking can help get rid of the air that was put into your GI tract during the procedure and reduce the bloating. If you had a lower endoscopy (such as a colonoscopy or flexible sigmoidoscopy) you may notice spotting of blood in your stool or on the toilet paper. If you underwent a bowel prep for your procedure, you may not have a normal bowel movement for a few days.  Please Note:  You might notice some irritation and congestion in your nose or some drainage.  This is from the oxygen used during your procedure.  There is no need for concern and it should clear up in a day or so.  SYMPTOMS TO REPORT IMMEDIATELY:  Following lower endoscopy (colonoscopy or flexible sigmoidoscopy):  Excessive amounts of blood in the stool  Significant tenderness or worsening of abdominal pains  Swelling of the abdomen that is new, acute  Fever of 100F or higher  For urgent or emergent issues, a gastroenterologist can be reached at any hour by calling [phone]. Do not use MyChart messaging for urgent concerns.    DIET:  We do recommend a small meal at first, but then you may proceed to your regular diet.  Drink plenty of  fluids but you should avoid alcoholic beverages for 24 hours.  ACTIVITY:  You should plan to take it easy for the rest of today and you should NOT DRIVE or use heavy machinery until tomorrow (because of the sedation medicines used during the test).    FOLLOW UP: Our staff will call the number listed on your records the next business day following your procedure.  We will call around 7:15- 8:00 am to check on you and address any questions or concerns that you may have regarding the information given to you following your procedure. If we do not reach you, we will leave a message.     If any biopsies were taken you will be contacted by phone or by letter within the next 1-3 weeks.  Please call us  at [phone] if you have not heard about the biopsies in 3 weeks.    SIGNATURES/CONFIDENTIALITY: You and/or your care partner have signed paperwork which will be entered into your electronic medical record.  These signatures attest to the fact that that the information above on your After Visit Summary has been reviewed and is understood.  Full responsibility of the confidentiality of this discharge information lies with you and/or your care-partner.

## 2023-11-04 NOTE — Progress Notes (Signed)
 Pt's states no medical or surgical changes since previsit or office visit.

## 2023-11-05 ENCOUNTER — Telehealth: Payer: Self-pay

## 2023-11-05 NOTE — Telephone Encounter (Signed)
 Left message

## 2023-11-06 ENCOUNTER — Encounter: Payer: Self-pay | Admitting: Internal Medicine

## 2023-11-10 ENCOUNTER — Other Ambulatory Visit: Payer: Self-pay | Admitting: Internal Medicine

## 2023-11-10 ENCOUNTER — Ambulatory Visit: Payer: Self-pay | Admitting: Pediatrics

## 2023-11-10 DIAGNOSIS — E119 Type 2 diabetes mellitus without complications: Secondary | ICD-10-CM | POA: Diagnosis not present

## 2023-11-10 DIAGNOSIS — H2513 Age-related nuclear cataract, bilateral: Secondary | ICD-10-CM | POA: Diagnosis not present

## 2023-11-10 DIAGNOSIS — H04123 Dry eye syndrome of bilateral lacrimal glands: Secondary | ICD-10-CM | POA: Diagnosis not present

## 2023-11-10 LAB — SURGICAL PATHOLOGY

## 2023-11-10 LAB — HM DIABETES EYE EXAM

## 2023-11-12 DIAGNOSIS — H5213 Myopia, bilateral: Secondary | ICD-10-CM | POA: Diagnosis not present

## 2023-11-12 NOTE — Telephone Encounter (Signed)
 Requested Prescriptions  Pending Prescriptions Disp Refills   pantoprazole  (PROTONIX ) 40 MG tablet [Pharmacy Med Name: PANTOPRAZOLE  SOD DR 40 MG TAB] 180 tablet 1    Sig: TAKE 1 TABLET BY MOUTH TWICE A DAY     Gastroenterology: Proton Pump Inhibitors Failed - 11/12/2023  4:48 PM      Failed - Valid encounter within last 12 months    Recent Outpatient Visits           4 weeks ago Fibromyalgia   [facility] [place], Rankin Buzzard, NP   1 month ago Ulcerated external hemorrhoids   [facility] [place], Rankin Buzzard, NP   1 month ago Rectal pain   [facility] [place], Rankin Buzzard, NP       Future Appointments             In 2 months Eddie Good, Ar[facility] - A Dept Of [facility]

## 2023-11-17 ENCOUNTER — Encounter: Payer: Self-pay | Admitting: Internal Medicine

## 2023-11-19 ENCOUNTER — Other Ambulatory Visit: Payer: Self-pay | Admitting: Rheumatology

## 2023-11-23 ENCOUNTER — Encounter: Payer: Self-pay | Admitting: Internal Medicine

## 2023-11-23 MED ORDER — ESZOPICLONE 2 MG PO TABS: 2.0000 mg | ORAL_TABLET | Freq: Every evening | ORAL | 0 refills | Status: DC | PRN

## 2023-11-24 MED ORDER — CYCLOBENZAPRINE HCL 10 MG PO TABS
10.0000 mg | ORAL_TABLET | Freq: Every day | ORAL | 0 refills | Status: DC | PRN
Start: 2023-11-24 — End: 2023-12-22

## 2023-11-24 MED ORDER — IBUPROFEN 800 MG PO TABS: 800.0000 mg | ORAL_TABLET | Freq: Every day | ORAL | 0 refills | Status: DC | PRN

## 2023-11-24 NOTE — Addendum Note (Signed)
 Addended by: Carollynn Cirri on: 11/24/2023 08:16 AM   Modules accepted: Orders

## 2023-11-25 ENCOUNTER — Ambulatory Visit: Admitting: Physician Assistant

## 2023-12-21 ENCOUNTER — Other Ambulatory Visit: Payer: Self-pay | Admitting: Internal Medicine

## 2023-12-22 NOTE — Telephone Encounter (Signed)
 Requested medication (s) are due for refill today: Yes  Requested medication (s) are on the active medication list: Yes  Last refill:  11/24/23  Future visit scheduled: Yes  Notes to clinic:  Unable to refill per protocol, cannot delegate. Ibuprofen  refilled x 30 days, provider approval needed.     Requested Prescriptions  Pending Prescriptions Disp Refills   ibuprofen  (ADVIL ) 800 MG tablet [Pharmacy Med Name: IBUPROFEN  800 MG TABLET] 30 tablet 0    Sig: Take 1 tablet (800 mg total) by mouth daily as needed.     Analgesics:  NSAIDS Failed - 12/22/2023  3:54 PM      Failed - Manual Review: Labs are only required if the patient has taken medication for more than 8 weeks.      Passed - Cr in normal range and within 360 days    Creat  Date Value Ref Range Status  09/22/2023 0.60 0.50 - 1.03 mg/dL Final   Creatinine, Urine  Date Value Ref Range Status  09/22/2023 172 20 - 275 mg/dL Final  95/91/7974 827 20 - 275 mg/dL Final         Passed - HGB in normal range and within 360 days    Hemoglobin  Date Value Ref Range Status  09/22/2023 13.6 11.7 - 15.5 g/dL Final  95/94/7981 86.7 11.1 - 15.9 g/dL Final         Passed - PLT in normal range and within 360 days    Platelets  Date Value Ref Range Status  09/22/2023 206 140 - 400 Thousand/uL Final  09/18/2016 268 150 - 379 x10E3/uL Final         Passed - HCT in normal range and within 360 days    HCT  Date Value Ref Range Status  09/22/2023 40.4 35.0 - 45.0 % Final   Hematocrit  Date Value Ref Range Status  09/18/2016 38.1 34.0 - 46.6 % Final         Passed - eGFR is 30 or above and within 360 days    GFR calc Af Amer  Date Value Ref Range Status  12/01/2019 >60 >60 mL/min Final   GFR, Estimated  Date Value Ref Range Status  01/15/2022 >60 >60 mL/min Final    Comment:    (NOTE) Calculated using the CKD-EPI Creatinine Equation (2021)    GFR  Date Value Ref Range Status  03/19/2020 109.09 >60.00 mL/min Final    eGFR  Date Value Ref Range Status  09/22/2023 105 > OR = 60 mL/min/1.66m2 Final         Passed - Patient is not pregnant      Passed - Valid encounter within last 12 months    Recent Outpatient Visits           2 months ago Fibromyalgia   [place] [facility] [place], Angeline ORN, NP   2 months ago Ulcerated external hemorrhoids   [place] [facility] [place], Angeline ORN, NP   3 months ago Rectal pain   [place] [facility] [place], Angeline ORN, NP       Future Appointments             In 1 month Cheryl, Waddell CHRISTELLA RIGGERS [facility] Rheumatology - A Dept Of [place]. [facility]             cyclobenzaprine  (FLEXERIL ) 10 MG tablet [Pharmacy Med Name: CYCLOBENZAPRINE  10 MG TABLET] 30 tablet 0  Sig: TAKE 1 TABLET BY MOUTH DAILY AS NEEDED FOR MUSCLE SPASMS.     Not Delegated - Analgesics:  Muscle Relaxants Failed - 12/22/2023  3:54 PM      Failed - This refill cannot be delegated      Passed - Valid encounter within last 6 months    Recent Outpatient Visits           2 months ago Fibromyalgia   [place] [facility] [place], Angeline ORN, NP   2 months ago Ulcerated external hemorrhoids   [place] [facility] [place], Angeline ORN, NP   3 months ago Rectal pain   [place] [facility] [place], Angeline ORN, NP       Future Appointments             In 1 month Cheryl, Waddell CHRISTELLA RIGGERS [facility] Rheumatology - A Dept Of [place]. [facility]

## 2024-01-04 ENCOUNTER — Other Ambulatory Visit: Payer: Self-pay | Admitting: Internal Medicine

## 2024-01-05 ENCOUNTER — Telehealth: Payer: Self-pay | Admitting: *Deleted

## 2024-01-05 ENCOUNTER — Encounter: Payer: Self-pay | Admitting: Internal Medicine

## 2024-01-05 ENCOUNTER — Ambulatory Visit: Admitting: Internal Medicine

## 2024-01-05 VITALS — BP 124/84 | Ht 65.5 in | Wt 237.2 lb

## 2024-01-05 DIAGNOSIS — B029 Zoster without complications: Secondary | ICD-10-CM | POA: Diagnosis not present

## 2024-01-05 DIAGNOSIS — M797 Fibromyalgia: Secondary | ICD-10-CM

## 2024-01-05 MED ORDER — VALACYCLOVIR HCL 1 G PO TABS: 1000.0000 mg | ORAL_TABLET | Freq: Three times a day (TID) | ORAL | 0 refills | Status: DC

## 2024-01-05 NOTE — Progress Notes (Signed)
 Subjective:    Patient ID: [name], female    DOB: 06-Jun-1966, 58 y.o.   MRN: [id number]  HPI  Discussed the use of AI scribe software for clinical note transcription with the patient, who gave verbal consent to proceed.  [name] is a 58 year old female who presents with a painful rash on her back.  She noticed a painful area on her back starting last Friday, initially sensitive to touch without any visible rash or redness. By yesterday, the discomfort increased, especially when her bra strap was in contact with the area. This morning, she observed a rash in the same spot, prompting her to seek medical attention.  She has not applied any topical treatments or taken any medication specifically for the rash. She initially thought the pain might be related to fibromyalgia, for which she takes Lyrica  once at night before bed.  She has not received the shingles vaccine due to concerns about live viruses, although she has received the pneumonia vaccine. She is allergic to gabapentin and has a history of issues with it.  Her fibromyalgia has been acting up recently, which may have contributed to her stress levels. She has been approved for disability after a two-year process, which has been a source of stress.   Review of Systems     Past Medical History:  Diagnosis Date   Allergy    Anemia    h/o   Anxiety    DDD (degenerative disc disease), lumbar    Depression    Diabetes mellitus, type 2 (HCC)    Fibromyalgia    GERD (gastroesophageal reflux disease)    Morbid obesity (HCC)    Pneumonia 01/2022   PONV (postoperative nausea and vomiting)    Pre-diabetes    Sjogren's syndrome (HCC)     Current Outpatient Medications  Medication Sig Dispense Refill   acetaminophen  (TYLENOL  8 HOUR ARTHRITIS PAIN) 650 MG CR tablet Take 1,300 mg by mouth daily.     albuterol  (VENTOLIN  HFA) 108 (90 Base) MCG/ACT inhaler TAKE 2 PUFFS BY MOUTH EVERY 6 HOURS AS NEEDED  FOR WHEEZE OR SHORTNESS OF BREATH 8.5 each 2   azaTHIOprine  (IMURAN ) 50 MG tablet TAKE 1 TABLET BY MOUTH EVERY DAY 30 tablet 2   Blood Glucose Monitoring Suppl DEVI 1 each by Does not apply route in the morning, at noon, and at bedtime. May substitute to any manufacturer covered by patient's insurance. 1 each 3   Blood Glucose Monitoring Suppl DEVI 1 each by Does not apply route in the morning, at noon, and at bedtime. May substitute to any manufacturer covered by patient's insurance. (Patient not taking: Reported on 11/04/2023) 1 each 0   Cinnamon-Chromium-Biotin ([employer] CINNAMON COMPLEX PO) Take by mouth.     Continuous Glucose Sensor (DEXCOM G7 SENSOR) MISC 1 Device by Does not apply route every 14 (fourteen) days. (Patient not taking: Reported on 11/04/2023) 6 each 1   cyclobenzaprine  (FLEXERIL ) 10 MG tablet TAKE 1 TABLET BY MOUTH DAILY AS NEEDED FOR MUSCLE SPASMS. 30 tablet 0   DULoxetine  HCl 20 MG CSDR Take 20 mg by mouth daily. (Patient not taking: Reported on 11/04/2023)     eszopiclone  (LUNESTA ) 2 MG TABS tablet Take 1 tablet (2 mg total) by mouth at bedtime as needed for sleep. Take immediately before bedtime 30 tablet 0   famotidine  (PEPCID ) 20 MG tablet TAKE 1 TABLET (20 MG TOTAL) BY MOUTH 2 (TWO) TIMES DAILY AS NEEDED FOR HEARTBURN OR INDIGESTION.  180 tablet 0   fexofenadine -pseudoephedrine (ALLEGRA-D 24) 180-240 MG 24 hr tablet Take 1 tablet by mouth daily. 180 tablet 0   fluticasone  (FLONASE  ALLERGY RELIEF) 50 MCG/ACT nasal spray      glucose blood test strip Use 1-3 times daily as needed DX E11.65 100 each 12   hydrocortisone  (ANUSOL -HC) 25 MG suppository Place 1 suppository (25 mg total) rectally at bedtime. (Patient not taking: Reported on 11/04/2023) 30 suppository 0   hydrocortisone -pramoxine (PROCTOFOAM-HC) rectal foam Place 1 applicator rectally 2 (two) times daily. (Patient not taking: Reported on 11/04/2023) 10 g 0   hydrOXYzine  (VISTARIL ) 25 MG capsule TAKE 1 CAPSULE (25 MG TOTAL) BY  MOUTH DAILY AS NEEDED. 90 capsule 1   ibuprofen  (ADVIL ) 800 MG tablet TAKE 1 TABLET (800 MG TOTAL) BY MOUTH DAILY AS NEEDED 30 tablet 0   meloxicam  (MOBIC ) 15 MG tablet Take 15 mg by mouth as needed for pain.     meloxicam  (MOBIC ) 7.5 MG tablet Take 7.5-15 mg by mouth daily.     methocarbamol  (ROBAXIN ) 500 MG tablet Take 1 tablet (500 mg total) by mouth 3 (three) times daily as needed. 90 tablet 1   Multiple Vitamin (MULTIVITAMIN PO) Take by mouth daily.     pantoprazole  (PROTONIX ) 40 MG tablet TAKE 1 TABLET BY MOUTH TWICE A DAY 180 tablet 1   phenylephrine  (,USE FOR PREPARATION-H,) 0.25 % suppository Place 1 suppository rectally 2 (two) times daily. (Patient not taking: Reported on 11/04/2023) 12 suppository 0   pregabalin  (LYRICA ) 50 MG capsule Take 1 capsule (50 mg total) by mouth 2 (two) times daily. (Patient not taking: Reported on 11/04/2023) 60 capsule 0   rosuvastatin  (CRESTOR ) 5 MG tablet Take 1 tablet (5 mg total) by mouth 3 (three) times a week. 45 tablet 1   traMADol  (ULTRAM ) 50 MG tablet Take 1 tablet (50 mg total) by mouth every 6 (six) hours as needed. 30 tablet 0   VITAMIN D  PO Take 1 tablet by mouth 2 (two) times a week. (Patient not taking: Reported on 11/04/2023)     No current facility-administered medications for this visit.    Allergies  Allergen Reactions   Hydrochlorothiazide  Other (See Comments)    The pt state she couldn't function and felt like she was dying    Lipitor [Atorvastatin ] Other (See Comments)    Muscle pain   Orange Juice [Orange Oil] Rash    rash   Penicillins Other (See Comments)    Childhood allergy Has patient had a PCN reaction causing immediate rash, facial/tongue/throat swelling, SOB or lightheadedness with hypotension: Unknown Has patient had a PCN reaction causing severe rash involving mucus membranes or skin necrosis: Unknown Has patient had a PCN reaction that required hospitalization: No  Has patient had a PCN reaction occurring within the  last 10 years: No  If all of the above answers are NO, then may proceed with Cephalosporin use.     Family History  Problem Relation Age of Onset   Hyperlipidemia Mother    Rheum arthritis Sister    Osteoarthritis Sister    Fibroids Sister    Fibroids Sister    Fibroids Sister    Heart disease Maternal Aunt    Stroke Maternal Aunt    Diabetes Maternal Aunt    Heart disease Maternal Uncle    Stroke Maternal Uncle    Rheum arthritis Nephew    Cancer Neg Hx     Social History   Socioeconomic History   Marital status: Married  Spouse name: Not on file   Number of children: 2   Years of education: Not on file   Highest education level: Some college, no degree  Occupational History   Occupation: retail  Tobacco Use   Smoking status: Never    Passive exposure: Current   Smokeless tobacco: Never  Vaping Use   Vaping status: Never Used  Substance and Sexual Activity   Alcohol use: Yes    Comment: occassionally   Drug use: Not Currently    Types: Marijuana   Sexual activity: Yes    Partners: Male    Birth control/protection: Post-menopausal, Surgical  Other Topics Concern   Not on file  Social History Narrative   Not on file   Social Drivers of Health   Financial Resource Strain: Medium Risk (09/21/2023)   Overall Financial Resource Strain (CARDIA)    Difficulty of Paying Living Expenses: Somewhat hard  Food Insecurity: Food Insecurity Present (09/21/2023)   Hunger Vital Sign    Worried About Running Out of Food in the Last Year: Sometimes true    Ran Out of Food in the Last Year: Sometimes true  Transportation Needs: No Transportation Needs (09/21/2023)   PRAPARE - Administrator, Civil Service (Medical): No    Lack of Transportation (Non-Medical): No  Physical Activity: Insufficiently Active (09/21/2023)   Exercise Vital Sign    Days of Exercise per Week: 2 days    Minutes of Exercise per Session: 20 min  Stress: Stress Concern Present (09/21/2023)    [employer] of Occupational Health - Occupational Stress Questionnaire    Feeling of Stress : Rather much  Social Connections: Unknown (09/21/2023)   Social Connection and Isolation Panel    Frequency of Communication with Friends and Family: Three times a week    Frequency of Social Gatherings with Friends and Family: More than three times a week    Attends Religious Services: Patient declined    Active Member of Clubs or Organizations: No    Attends Engineer, structural: Not on file    Marital Status: Married  Catering manager Violence: Not At Risk (06/03/2022)   Humiliation, Afraid, Rape, and Kick questionnaire    Fear of Current or Ex-Partner: No    Emotionally Abused: No    Physically Abused: No    Sexually Abused: No     Constitutional: Patient reports intermittent headaches, chronic fatigue.  Denies fever, malaise, or abrupt weight changes.  Respiratory: Denies difficulty breathing, shortness of breath, cough or sputum production.   Cardiovascular: Denies chest pain, chest tightness, palpitations or swelling in the hands or feet.  Musculoskeletal: Patient reports chronic joint and muscle pain.  Denies decrease in range of motion, difficulty with gait, or joint swelling.  Skin: Patient reports rash of back.  Denies ulcercations.  Neurological: Patient reports insomnia.  Denies dizziness, difficulty with memory, difficulty with speech or problems with balance and coordination.  Psych: Patient has a history of anxiety and depression.  Denies SI/HI.  No other specific complaints in a complete review of systems (except as listed in HPI above).  Objective:   Physical Exam BP 124/84 (BP Location: Right Arm, Patient Position: Sitting, Cuff Size: Normal)   Ht 5' 5.5 (1.664 m)   Wt 237 lb 3.2 oz (107.6 kg)   BMI 38.87 kg/m    Wt Readings from Last 3 Encounters:  11/04/23 233 lb (105.7 kg)  10/28/23 233 lb 6.4 oz (105.9 kg)  10/06/23 234 lb (106.1 kg)  General:  Appears her stated age, obese, in NAD. Skin: Grouped, vesicular rash on erythematous base noted in the right subscapular region extending down to the right flank. Cardiovascular: Normal rate and rhythm. S1,S2 noted.  No murmur, rubs or gallops noted.  Pulmonary/Chest: Normal effort and positive vesicular breath sounds. No respiratory distress. No wheezes, rales or ronchi noted.  Musculoskeletal: Gait slow and steady without device. Neurological: Alert and oriented.    BMET    Component Value Date/Time   NA 142 09/22/2023 1427   NA 142 09/18/2016 0929   K 3.8 09/22/2023 1427   CL 107 09/22/2023 1427   CO2 26 09/22/2023 1427   GLUCOSE 96 09/22/2023 1427   BUN 10 09/22/2023 1427   BUN 9 09/18/2016 0929   CREATININE 0.60 09/22/2023 1427   CALCIUM  9.9 09/22/2023 1427   GFRNONAA >60 01/15/2022 2334   GFRAA >60 12/01/2019 1558    Lipid Panel     Component Value Date/Time   CHOL 228 (H) 09/22/2023 1427   CHOL 200 (H) 03/23/2017 0844   TRIG 125 09/22/2023 1427   HDL 67 09/22/2023 1427   HDL 57 03/23/2017 0844   CHOLHDL 3.4 09/22/2023 1427   VLDL 36.2 03/19/2020 0918   LDLCALC 136 (H) 09/22/2023 1427    CBC    Component Value Date/Time   WBC 3.9 09/22/2023 1427   RBC 4.28 09/22/2023 1427   HGB 13.6 09/22/2023 1427   HGB 13.2 09/18/2016 0929   HCT 40.4 09/22/2023 1427   HCT 38.1 09/18/2016 0929   PLT 206 09/22/2023 1427   PLT 268 09/18/2016 0929   MCV 94.4 09/22/2023 1427   MCV 94 09/18/2016 0929   MCH 31.8 09/22/2023 1427   MCHC 33.7 09/22/2023 1427   RDW 13.1 09/22/2023 1427   RDW 12.8 09/18/2016 0929   LYMPHSABS 1,357 03/23/2023 1038   LYMPHSABS 2.8 08/03/2014 1609   MONOABS 0.6 08/30/2019 0315   EOSABS 80 03/23/2023 1038   EOSABS 0.1 08/03/2014 1609   BASOSABS 21 03/23/2023 1038   BASOSABS 0.0 08/03/2014 1609    Hgb A1C Lab Results  Component Value Date   HGBA1C 6.0 (H) 09/22/2023            Assessment & Plan:   Assessment and Plan    Herpes  Zoster (Shingles) Rash consistent with shingles, likely exacerbated by stress or illness. On Lyrica  for neuropathic pain, which may aid in shingles-associated pain management. Risk of transmission to vulnerable populations. - Prescribed Valtrex  1 gram TID for 7 days. - Advised to avoid contact with pregnant women, infants under one year old, and immunocompromised individuals during active rash. - Instructed to keep rash covered and avoid scratching. - Continue Lyrica ; consider extra dose if pain severe. - Consult rheumatologist about shingles vaccine 90 days post-rash resolution.  Fibromyalgia Fibromyalgia symptoms possibly contributing to stress. Managed with Lyrica . - Continue Lyrica  for management.   Schedule an appointment for your annual exam Angeline Laura, NP

## 2024-01-05 NOTE — Telephone Encounter (Signed)
 Patient advised to hold imuran  until the vesicles have completely healed.

## 2024-01-05 NOTE — Telephone Encounter (Signed)
 Patient contacted the office and states she saw PCP today and was diagnosed with shingles. Patient states she is on Imuran  and wants to know if she should hold it until the shingles have cleared.

## 2024-01-05 NOTE — Telephone Encounter (Signed)
 Please advise the patient to hold imuran  until the vesicles have completely healed.

## 2024-01-05 NOTE — Patient Instructions (Signed)
 Shingles  Shingles, or herpes zoster, is an infection. It gives you a skin rash and blisters. These infected areas may hurt a lot. Shingles only happens if: You've had chickenpox. You've been given a shot called a vaccine to protect you from getting chickenpox. Shingles is rare in this case. What are the causes? Shingles is caused by a germ called the varicella-zoster virus. This is the same germ that causes chickenpox. After you're exposed to the germ, it stays in your body but is dormant. This means it isn't active. Shingles happens if the germ becomes active again. This can happen years after you're first exposed to the germ. What increases the risk? You may be more likely to get shingles if: You're older than 58 years of age. You're under a lot of stress. You have a weak immune system. The immune system is your body's defense system. It may be weak if: You have human immunodeficiency virus (HIV). You have acquired immunodeficiency syndrome (AIDS). You have cancer. You take medicines that weaken your immune system. These include organ transplant medicines. What are the signs or symptoms? The first symptoms of shingles may be itching, tingling, or pain. Your skin may feel like it's burning. A few days or weeks later, you'll get a rash. Here's what you can expect: The rash is likely to be on one side of your body. The rash may be shaped like a belt or a band. Over time, it will turn into blisters filled with fluid. The blisters will break open and change into scabs. The scabs will dry up in about 2-3 weeks. You may also have: A fever. Chills. A headache. Nausea. How is this diagnosed? Shingles is diagnosed with a skin exam. A sample called a culture may be taken from one of your blisters and sent to a lab. This will show if you have shingles. How is this treated? The rash may last for several weeks. There's no cure for shingles, but your health care provider may give you medicines.  These medicines may: Help with pain. Help with itching. Help with irritation and swelling. Help you get better sooner. Help to prevent long-term problems. If the rash is on your face, you may need to see an eye doctor or an ear, nose, and throat (ENT) doctor. Follow these instructions at home: Medicines Take your medicines only as told by your provider. Put an anti-itch cream or numbing cream on the rash or blisters as told by your provider. Relieving itching and discomfort  To help with itching: Put cold, wet cloths called cold compresses on the rash or blisters. Take a cool bath. Try adding baking soda or dry oatmeal to the water. Do not bathe in hot water. Use calamine lotion on the rash or blisters. You can get this type of lotion at the store. Blister and rash care Keep your rash covered with a loose bandage. Wear loose clothes that don't rub on your rash. Take care of your rash as told by your provider. Make sure you: Wash your hands with soap and water for at least 20 seconds before and after you change your bandage. If you can't use soap and water, use hand sanitizer. Keep your rash and blisters clean by washing them with mild soap and cool water. Change your bandage. Check your rash every day for signs of infection. Check for: More redness, swelling, or pain. Fluid or blood. Warmth. Pus or a bad smell. Do not scratch your rash. Do not pick at your  blisters. To help you not scratch: Keep your fingernails clean and cut short. Try to wear gloves or mittens when you sleep. General instructions Rest. Wash your hands often with soap and water for at least 20 seconds. If you can't use soap and water, use hand sanitizer. Washing your hands lowers your chance of getting a skin infection. Your infection can cause chickenpox in others. If you have blisters that aren't scabs yet, stay away from: Babies. Pregnant people. Children who have eczema. Older people who have organ  transplants. People who have a long-term, or chronic, illness. Anyone who hasn't had chickenpox before. Anyone who hasn't gotten the chickenpox vaccine. How is this prevented? Vaccines are the best way to prevent you from getting chickenpox or shingles. Talk with your provider about getting these shots. Where to find more information Centers for Disease Control and Prevention (CDC): [employer]erProm[employer].no Contact a health care provider if: Your pain doesn't get better with medicine. Your pain doesn't get better after the rash heals. You have any signs of infection around the rash. Your rash or blisters get worse. You have a fever or chills. Get help right away if: The rash is on your face or nose. You have pain in your face or by your eye. You lose feeling on one side of your face. You have trouble seeing. You have ear pain or ringing in your ear. This information is not intended to replace advice given to you by your health care provider. Make sure you discuss any questions you have with your health care provider. Document Revised: 03/05/2023 Document Reviewed: 07/18/2022 Elsevier Patient Education  2024 [employer].

## 2024-01-06 MED ORDER — TRAMADOL HCL 50 MG PO TABS: 50.0000 mg | ORAL_TABLET | Freq: Four times a day (QID) | ORAL | 0 refills | Status: DC | PRN

## 2024-01-12 ENCOUNTER — Other Ambulatory Visit: Payer: Self-pay | Admitting: Internal Medicine

## 2024-01-13 NOTE — Telephone Encounter (Signed)
 Requested medication (s) are due for refill today: no  Requested medication (s) are on the active medication list: yes  Last refill:  12/22/23 #30  Future visit scheduled: yes  Notes to clinic:  med not delegated to NT to RF   Requested Prescriptions  Pending Prescriptions Disp Refills   cyclobenzaprine  (FLEXERIL ) 10 MG tablet [Pharmacy Med Name: CYCLOBENZAPRINE  10 MG TABLET] 21 tablet 1    Sig: TAKE 1 TABLET BY MOUTH DAILY AS NEEDED FOR MUSCLE SPASMS.     Not Delegated - Analgesics:  Muscle Relaxants Failed - 01/13/2024  8:32 AM      Failed - This refill cannot be delegated      Passed - Valid encounter within last 6 months    Recent Outpatient Visits           1 week ago Herpes zoster without complication   [place] [facility] [place], Angeline ORN, NP   3 months ago Fibromyalgia   [place] [facility] [place], Angeline ORN, NP   3 months ago Ulcerated external hemorrhoids   [place] [facility], Angeline ORN, NP   3 months ago Rectal pain   [place] [facility] [place], Angeline ORN, NP       Future Appointments             In 2 weeks Cheryl Waddell CHRISTELLA RIGGERS [facility] Rheumatology - A Dept Of [place]. [facility]

## 2024-01-14 NOTE — Progress Notes (Signed)
 Office Visit Note  Patient: [name]             Date of Birth: 10/28/1965           MRN: [id number]             PCP: Antonette Angeline ORN, NP Referring: Antonette Angeline ORN, NP Visit Date: 01/28/2024 Occupation: @GUAROCC @  Subjective:  Discuss lab results   History of Present Illness: [name] is a 58 y.o. female with history of sjogren's syndrome, inflammatory arthritis, and osteoarthritis.  Patient is prescribed Imuran  50 mg 1 tablet by mouth daily.   She has been holding imuran  since 12/3023 due to being treated for shingles.  Patient states that she had an outbreak of shingles on the right side of her mid back and was treated with Valtrex .  Patient states that the lesions have scabbed over and the neuralgias have been manageable. Patient states that on Monday she started to have a fibromyalgia flare.  Patient states that she was recently out of town so her sleep cycle and activity level were not as regulated.  Patient states that she has noticed a significant benefit since initiating Lyrica  50 mg twice daily.  She is tolerating well without any side effects.  Patient states that her symptoms have been better controlled while taking Lyrica  so she does not think she needs to increase the dose of Imuran .  Activities of Daily Living:  Patient reports morning stiffness for 1-1.5 hours.   Patient Reports nocturnal pain.  Difficulty dressing/grooming: Reports Difficulty climbing stairs: Reports Difficulty getting out of chair: Reports Difficulty using hands for taps, buttons, cutlery, and/or writing: Reports  Review of Systems  Constitutional:  Positive for fatigue.  HENT:  Positive for mouth dryness. Negative for mouth sores.   Eyes:  Positive for dryness.  Respiratory:  Negative for shortness of breath.   Cardiovascular:  Positive for palpitations. Negative for chest pain.  Gastrointestinal:  Negative for blood in stool, constipation and diarrhea.  Endocrine: Positive  for increased urination.  Genitourinary:  Negative for involuntary urination.  Musculoskeletal:  Positive for joint pain, gait problem, joint pain, joint swelling, myalgias, muscle weakness, morning stiffness, muscle tenderness and myalgias.  Skin:  Positive for rash and hair loss. Negative for color change and sensitivity to sunlight.  Allergic/Immunologic: Negative for susceptible to infections.  Neurological:  Positive for headaches. Negative for dizziness.  Hematological:  Negative for swollen glands.  Psychiatric/Behavioral:  Positive for sleep disturbance. Negative for depressed mood. The patient is not nervous/anxious.     PMFS History:  Patient Active Problem List   Diagnosis Date Noted   Type 2 diabetes mellitus with hyperglycemia, without long-term current use of insulin (HCC) 04/10/2023   Primary osteoarthritis of both hands 10/31/2021   Fibromyalgia 10/24/2021   Sjogren's syndrome (HCC) 10/24/2021   DDD (degenerative disc disease), lumbar 10/14/2021   Insomnia 02/14/2021   Hyperlipidemia associated with type 2 diabetes mellitus (HCC) 12/31/2020   Frequent headaches 12/31/2020   Class 2 obesity due to excess calories with body mass index (BMI) of 38.0 to 38.9 in adult 12/31/2020   Gastroesophageal reflux disease without esophagitis 11/15/2018   Anxiety and depression 08/03/2014    Past Medical History:  Diagnosis Date   Allergy    Anemia    h/o   Anxiety    Arthritis    DDD (degenerative disc disease), lumbar    Depression    Diabetes mellitus, type 2 (HCC)    Fibromyalgia  GERD (gastroesophageal reflux disease)    Morbid obesity (HCC)    Pneumonia 01/2022   PONV (postoperative nausea and vomiting)    Pre-diabetes    Shingles    Sjogren's syndrome (HCC)     Family History  Problem Relation Age of Onset   Hyperlipidemia Mother    Fibroids Sister    Fibroids Sister    Fibroids Sister    Arthritis Sister    Osteoarthritis Sister    Osteoarthritis Sister     Arthritis Sister    Obesity Sister    ADD / ADHD Daughter    Heart disease Maternal Aunt    Stroke Maternal Aunt    Diabetes Maternal Aunt    Heart disease Maternal Uncle    Stroke Maternal Uncle    Rheum arthritis Nephew    Cancer Neg Hx    Past Surgical History:  Procedure Laterality Date   ABDOMINAL HYSTERECTOMY  2008   LAVH, endometriosis ablation, mccall culdoplasty   CHOLECYSTECTOMY N/A 08/31/2019   Procedure: LAPAROSCOPIC CHOLECYSTECTOMY;  Surgeon: Desiderio Schanz, MD;  Location: [facility];  Service: General;  Laterality: N/A;   COLONOSCOPY  2022   DIAGNOSTIC LAPAROSCOPY  2002   Hysteroscopy, diagnostic laparoscopy, extensive lysis of adhesions, endometrial ablation, RSO   RIGHT OOPHORECTOMY  2007   with right salpingectomy   TOTAL KNEE ARTHROPLASTY 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   Procedure: Partial KNEE ARTHROPLASTY;  Surgeon: Leora Lynwood SAUNDERS, MD;  Location: [facility];  Service: Orthopedics;  Laterality: Left;   TUBAL LIGATION     Social History   Social History Narrative   Not on file   Immunization History  Administered Date(s) Administered   Influenza, Seasonal, Injecte, Preservative Fre 04/02/2023   Influenza,inj,Quad PF,6+ Mos 03/17/2015, 03/17/2017, 02/25/2019, 03/19/2020, 03/28/2021   PFIZER(Purple Top)SARS-COV-2 Vaccination 07/11/2019, 08/01/2019   Pneumococcal Polysaccharide-23 09/22/2023   Tdap 02/09/2007, 09/18/2016     Objective: Vital Signs: BP 131/81 (BP Location: Left Arm, Patient Position: Sitting, Cuff Size: Normal)   Pulse 90   Resp 14   Ht 5' 5 (1.651 m)   Wt 238 lb (108 kg)   BMI 39.61 kg/m    Physical Exam Vitals and nursing note reviewed.  Constitutional:      Appearance: She is well-developed.  HENT:     Head: Normocephalic and atraumatic.  Eyes:     Conjunctiva/sclera: Conjunctivae normal.  Cardiovascular:     Rate and Rhythm: Normal rate and regular rhythm.     Heart sounds: Normal heart sounds.  Pulmonary:     Effort: Pulmonary effort  is normal.     Breath sounds: Normal breath sounds.  Abdominal:     General: Bowel sounds are normal.     Palpations: Abdomen is soft.  Musculoskeletal:     Cervical back: Normal range of motion.  Lymphadenopathy:     Cervical: No cervical adenopathy.  Skin:    General: Skin is warm and dry.     Capillary Refill: Capillary refill takes less than 2 seconds.  Neurological:     Mental Status: She is alert and oriented to person, place, and time.  Psychiatric:        Behavior: Behavior normal.      Musculoskeletal Exam:  Generalized hyperalgesia and positive tender points on exam.  Shoulder joints have good range of motion.  Trapezius muscle tension and tenderness bilaterally.  Elbow joints, wrist joints, MCPs, PIPs, DIPs have good range of motion with no synovitis.  Complete fist formation bilaterally.  Hip joints have good  range of motion with no groin pain.  Right knee has good ROM with no warmth or effusion.  Left knee replacement has good ROM with no warmth or effusion.   Ankle joints have good range of motion with no tenderness or joint swelling.  CDAI Exam: CDAI Score: -- Patient Global: --; Provider Global: -- Swollen: --; Tender: -- Joint Exam 01/28/2024   No joint exam has been documented for this visit   There is currently no information documented on the homunculus. Go to the Rheumatology activity and complete the homunculus joint exam.  Investigation: No additional findings.  Imaging: No results found.  Recent Labs: Lab Results  Component Value Date   WBC 3.8 01/26/2024   HGB 12.9 01/26/2024   PLT 218 01/26/2024   NA 142 09/22/2023   K 3.8 09/22/2023   CL 107 09/22/2023   CO2 26 09/22/2023   GLUCOSE 96 09/22/2023   BUN 10 09/22/2023   CREATININE 0.60 09/22/2023   BILITOT 0.5 09/22/2023   ALKPHOS 62 03/19/2020   AST 15 09/22/2023   ALT 15 09/22/2023   PROT 7.5 09/22/2023   PROT 7.5 09/22/2023   ALBUMIN 3.9 03/19/2020   CALCIUM  9.9 09/22/2023   GFRAA  >60 12/01/2019   QFTBGOLDPLUS NEGATIVE 04/17/2022    Speciality Comments: No specialty comments available.  Procedures:  No procedures performed Allergies: Hydrochlorothiazide , Lipitor [atorvastatin ], Orange juice [orange oil], and Penicillins    Assessment / Plan:     Visit Diagnoses: Inflammatory arthritis: She has no synovitis on examination today.  Her symptoms are currently well-controlled taking Imuran  50 mg 1 tablet by mouth daily.  Her pain levels have been adequately controlled since starting Lyrica  50 mg twice daily.  She also remains on meloxicam  7.5 to 15 mg daily as needed for pain relief and tramadol  50 mg 1 tablet every 6 hours as needed. No medication changes will be made at this time.  She was advised to notify us  if she develops any signs or symptoms of a flare.  She will follow-up in the office in 5 months or sooner if needed.  Sjogren's syndrome with keratoconjunctivitis sicca (HCC) - Positive ANA, positive SSA, sicca symptoms, arthralgias:  Patient continues to have chronic sicca symptoms.  She has been using over-the-counter products for symptomatic relief.   No symptoms of an inflammatory arthritis.  No synovitis noted.  Discussed the increased risk for developing lymphoma patients with Sjogren syndrome.  SPEP normal on 09/22/2023.  Lab work from 01/26/24 was reviewed today in the office: ESR WNL, complements WNL, CRP WNL, CBC WNL, and urine protein creatinine ratio WNL. Lab work from 09/22/23: ANA+, RF-, Ro+, La-, SPEP normal.   Patient will remain on Imuran  50 mg 1 tablet by mouth daily.  She is advised to notify us  if she develops any new or worsening symptoms.  High risk medication use - Imuran  50 mg 1 tablet by mouth daily.  Concern for early ILD and concern for hepatorenal toxicity--not be a good candidate for methotrexate . CBC WNL on 01/26/24.  CMP updated today. Discussed the importance of holding Imuran  if she develops signs or symptoms of an infection and to resume  once the infection is completely cleared.  - Plan: Comprehensive metabolic panel with GFR  Primary osteoarthritis of both hands: Patient experiences intermittent pain and stiffness involving both hands.  No synovitis noted on examination today.  She was able to make a complete fist bilaterally.  Paresthesia of both hands: Improved since initiating Lyrica .  Trochanteric  bursitis of both hips: Not currently symptomatic.  Status post left knee replacement - Partial-06/03/22 Performed by Dr. Jesse well.  Primary osteoarthritis of right knee: No warmth or effusion noted.  Degeneration of intervertebral disc of lumbar region with discogenic back pain: No symptoms of radiculopathy.  Fibromyalgia -She had a fibromyalgia flare starting on Monday.  Patient has not been taking Lyrica  as consistent recently.  She was recently traveling so her sleep cycle and activity level were out of the ordinary.  Overall she has noticed a significant benefit since initiating Lyrica  50 mg 1 capsule twice daily.  She is also taking meloxicam  7.5 mg to 15 mg daily as needed for pain relief and tramadol  50 mg 1 tablet every 6 hours as needed for pain relief.    Other medical conditions are listed as follows:  Anxiety and depression  Adjustment insomnia: She takes Lunesta  at night 2 mg at bedtime for insomnia.  Frequent headaches  Pure hypertriglyceridemia  Hypermobility of joint  Gastroesophageal reflux disease without esophagitis  Seasonal allergies  Family history of rheumatoid arthritis- two sisters and nephew  History of diabetes mellitus  Orders: Orders Placed This Encounter  Procedures   Comprehensive metabolic panel with GFR   Meds ordered this encounter  Medications   azaTHIOprine  (IMURAN ) 50 MG tablet    Sig: Take 1 tablet (50 mg total) by mouth daily.    Dispense:  90 tablet    Refill:  0     Follow-Up Instructions: Return in about 5 months (around 06/29/2024) for Sjogren's  syndrome.   Waddell CHRISTELLA Craze, PA-C  Note - This record has been created using Dragon software.  Chart creation errors have been sought, but may not always  have been located. Such creation errors do not reflect on  the standard of medical care.

## 2024-01-19 ENCOUNTER — Telehealth: Payer: Self-pay

## 2024-01-19 NOTE — Telephone Encounter (Signed)
 Patient called the office to confirm when her appointment was an verify that she needed labs.

## 2024-01-22 ENCOUNTER — Other Ambulatory Visit: Payer: Self-pay

## 2024-01-22 DIAGNOSIS — M3501 Sicca syndrome with keratoconjunctivitis: Secondary | ICD-10-CM

## 2024-01-22 DIAGNOSIS — Z79899 Other long term (current) drug therapy: Secondary | ICD-10-CM

## 2024-01-22 DIAGNOSIS — M1990 Unspecified osteoarthritis, unspecified site: Secondary | ICD-10-CM

## 2024-01-22 NOTE — Telephone Encounter (Signed)
 Patient called the office to have her labs released to her PCP for them to do them. Labs released and faxed to PCP

## 2024-01-26 ENCOUNTER — Ambulatory Visit: Admitting: Internal Medicine

## 2024-01-26 ENCOUNTER — Encounter: Payer: Self-pay | Admitting: Internal Medicine

## 2024-01-26 VITALS — BP 122/82 | Ht 65.5 in | Wt 237.4 lb

## 2024-01-26 DIAGNOSIS — Z0001 Encounter for general adult medical examination with abnormal findings: Secondary | ICD-10-CM

## 2024-01-26 DIAGNOSIS — E66812 Obesity, class 2: Secondary | ICD-10-CM | POA: Diagnosis not present

## 2024-01-26 DIAGNOSIS — Z6838 Body mass index (BMI) 38.0-38.9, adult: Secondary | ICD-10-CM

## 2024-01-26 DIAGNOSIS — Z1231 Encounter for screening mammogram for malignant neoplasm of breast: Secondary | ICD-10-CM

## 2024-01-26 DIAGNOSIS — E1165 Type 2 diabetes mellitus with hyperglycemia: Secondary | ICD-10-CM | POA: Diagnosis not present

## 2024-01-26 DIAGNOSIS — M3500 Sicca syndrome, unspecified: Secondary | ICD-10-CM

## 2024-01-26 NOTE — Assessment & Plan Note (Signed)
 Encouraged diet and exercise for weight loss ?

## 2024-01-26 NOTE — Progress Notes (Signed)
 Subjective:    Patient ID: [name], female    DOB: 1966/01/01, 58 y.o.   MRN: [id number]  HPI  Patient presents to clinic today for her annual exam.  Flu: 03/2023 Tetanus: 09/2016 COVID: Pfizer x 2 Shingrix: Never Pap smear: Hysterectomy Mammogram: 03/2019 Bone density: 07/2022 Colon screening: 10/2023 Vision screening: annually Dentist: as needed  Diet: She does eat lean meat. She consumes fruits and veggies. She does eat some fried foods. She drinks mostly water , juice Exercise: Walking  Review of Systems     Past Medical History:  Diagnosis Date   Allergy    Anemia    h/o   Anxiety    Arthritis    DDD (degenerative disc disease), lumbar    Depression    Diabetes mellitus, type 2 (HCC)    Fibromyalgia    GERD (gastroesophageal reflux disease)    Morbid obesity (HCC)    Pneumonia 01/2022   PONV (postoperative nausea and vomiting)    Pre-diabetes    Sjogren's syndrome (HCC)     Current Outpatient Medications  Medication Sig Dispense Refill   acetaminophen  (TYLENOL  8 HOUR ARTHRITIS PAIN) 650 MG CR tablet Take 1,300 mg by mouth daily.     azaTHIOprine  (IMURAN ) 50 MG tablet TAKE 1 TABLET BY MOUTH EVERY DAY 30 tablet 2   Blood Glucose Monitoring Suppl DEVI 1 each by Does not apply route in the morning, at noon, and at bedtime. May substitute to any manufacturer covered by patient's insurance. 1 each 3   Cinnamon-Chromium-Biotin ([employer] CINNAMON COMPLEX PO) Take by mouth.     cyclobenzaprine  (FLEXERIL ) 10 MG tablet TAKE 1 TABLET BY MOUTH DAILY AS NEEDED FOR MUSCLE SPASMS. 30 tablet 0   DULoxetine  HCl 20 MG CSDR Take 20 mg by mouth daily. (Patient not taking: Reported on 01/05/2024)     eszopiclone  (LUNESTA ) 2 MG TABS tablet Take 1 tablet (2 mg total) by mouth at bedtime as needed for sleep. Take immediately before bedtime 30 tablet 0   famotidine  (PEPCID ) 20 MG tablet TAKE 1 TABLET (20 MG TOTAL) BY MOUTH 2 (TWO) TIMES DAILY AS NEEDED FOR HEARTBURN OR  INDIGESTION. 180 tablet 0   fexofenadine -pseudoephedrine (ALLEGRA-D 24) 180-240 MG 24 hr tablet Take 1 tablet by mouth daily. 180 tablet 0   fluticasone  (FLONASE  ALLERGY RELIEF) 50 MCG/ACT nasal spray      glucose blood test strip Use 1-3 times daily as needed DX E11.65 100 each 12   hydrOXYzine  (VISTARIL ) 25 MG capsule TAKE 1 CAPSULE (25 MG TOTAL) BY MOUTH DAILY AS NEEDED. 90 capsule 1   ibuprofen  (ADVIL ) 800 MG tablet TAKE 1 TABLET (800 MG TOTAL) BY MOUTH DAILY AS NEEDED 30 tablet 0   meloxicam  (MOBIC ) 15 MG tablet Take 15 mg by mouth as needed for pain.     meloxicam  (MOBIC ) 7.5 MG tablet Take 7.5-15 mg by mouth daily.     methocarbamol  (ROBAXIN ) 500 MG tablet Take 1 tablet (500 mg total) by mouth 3 (three) times daily as needed. 90 tablet 1   Multiple Vitamin (MULTIVITAMIN PO) Take by mouth daily.     pantoprazole  (PROTONIX ) 40 MG tablet TAKE 1 TABLET BY MOUTH TWICE A DAY 180 tablet 1   pregabalin  (LYRICA ) 50 MG capsule Take 1 capsule (50 mg total) by mouth 2 (two) times daily. (Patient taking differently: Take 50 mg by mouth at bedtime.) 60 capsule 0   rosuvastatin  (CRESTOR ) 5 MG tablet Take 1 tablet (5 mg total) by mouth 3 (three)  times a week. 45 tablet 1   traMADol  (ULTRAM ) 50 MG tablet Take 1 tablet (50 mg total) by mouth every 6 (six) hours as needed. 30 tablet 0   valACYclovir  (VALTREX ) 1000 MG tablet Take 1 tablet (1,000 mg total) by mouth 3 (three) times daily. For shingles 21 tablet 0   No current facility-administered medications for this visit.    Allergies  Allergen Reactions   Hydrochlorothiazide  Other (See Comments)    The pt state she couldn't function and felt like she was dying    Lipitor [Atorvastatin ] Other (See Comments)    Muscle pain   Orange Juice [Orange Oil] Rash    rash   Penicillins Other (See Comments)    Childhood allergy Has patient had a PCN reaction causing immediate rash, facial/tongue/throat swelling, SOB or lightheadedness with hypotension:  Unknown Has patient had a PCN reaction causing severe rash involving mucus membranes or skin necrosis: Unknown Has patient had a PCN reaction that required hospitalization: No  Has patient had a PCN reaction occurring within the last 10 years: No  If all of the above answers are NO, then may proceed with Cephalosporin use.     Family History  Problem Relation Age of Onset   Hyperlipidemia Mother    Rheum arthritis Sister    Osteoarthritis Sister    Fibroids Sister    Fibroids Sister    Fibroids Sister    Heart disease Maternal Aunt    Stroke Maternal Aunt    Diabetes Maternal Aunt    Heart disease Maternal Uncle    Stroke Maternal Uncle    Rheum arthritis Nephew    ADD / ADHD Daughter    Arthritis Sister    Arthritis Sister    Obesity Sister    Cancer Neg Hx     Social History   Socioeconomic History   Marital status: Married    Spouse name: Not on file   Number of children: 2   Years of education: Not on file   Highest education level: Some college, no degree  Occupational History   Occupation: retail  Tobacco Use   Smoking status: Never    Passive exposure: Current   Smokeless tobacco: Never  Vaping Use   Vaping status: Never Used  Substance and Sexual Activity   Alcohol use: Yes    Comment: occassionally   Drug use: Not Currently    Types: Marijuana   Sexual activity: Yes    Partners: Male    Birth control/protection: Post-menopausal, Surgical  Other Topics Concern   Not on file  Social History Narrative   Not on file   Social Drivers of Health   Financial Resource Strain: Low Risk  (01/05/2024)   Overall Financial Resource Strain (CARDIA)    Difficulty of Paying Living Expenses: Not hard at all  Food Insecurity: No Food Insecurity (01/05/2024)   Hunger Vital Sign    Worried About Running Out of Food in the Last Year: Never true    Ran Out of Food in the Last Year: Never true  Transportation Needs: No Transportation Needs (01/05/2024)   PRAPARE -  Administrator, Civil Service (Medical): No    Lack of Transportation (Non-Medical): No  Physical Activity: Insufficiently Active (01/05/2024)   Exercise Vital Sign    Days of Exercise per Week: 3 days    Minutes of Exercise per Session: 30 min  Stress: No Stress Concern Present (01/05/2024)   [employer] of Occupational Health - Occupational Stress  Questionnaire    Feeling of Stress: Only a little  Social Connections: Moderately Integrated (01/05/2024)   Social Connection and Isolation Panel    Frequency of Communication with Friends and Family: More than three times a week    Frequency of Social Gatherings with Friends and Family: More than three times a week    Attends Religious Services: 1 to 4 times per year    Active Member of [employer] or Organizations: No    Attends Banker Meetings: Not on file    Marital Status: Married  Catering manager Violence: Not At Risk (06/03/2022)   Humiliation, Afraid, Rape, and Kick questionnaire    Fear of Current or Ex-Partner: No    Emotionally Abused: No    Physically Abused: No    Sexually Abused: No     Constitutional: Patient reports intermittent headaches.  Denies fever, malaise, fatigue, or abrupt weight changes.  HEENT: Denies eye pain, eye redness, ear pain, ringing in the ears, wax buildup, runny nose, nasal congestion, bloody nose, or sore throat. Respiratory: Denies difficulty breathing, shortness of breath, cough or sputum production.   Cardiovascular: Denies chest pain, chest tightness, palpitations or swelling in the hands or feet.  Gastrointestinal: Denies abdominal pain, bloating, constipation, diarrhea or blood in the stool.  GU: Denies urgency, frequency, pain with urination, burning sensation, blood in urine, odor or discharge. Musculoskeletal: Patient reports chronic joint and muscle pain.  Denies decrease in range of motion, difficulty with gait, or joint swelling.  Skin: Denies redness, rashes,  lesions or ulcercations.  Neurological: Patient reports insomnia.  Denies dizziness, difficulty with memory, difficulty with speech or problems with balance and coordination.  Psych: Patient has a history of anxiety and depression.  Denies SI/HI.  No other specific complaints in a complete review of systems (except as listed in HPI above).  Objective:   Physical Exam  BP 122/82 (BP Location: Left Arm, Patient Position: Sitting, Cuff Size: Large)   Ht 5' 5.5 (1.664 m)   Wt 237 lb 6.4 oz (107.7 kg)   BMI 38.90 kg/m    Wt Readings from Last 3 Encounters:  01/05/24 237 lb 3.2 oz (107.6 kg)  11/04/23 233 lb (105.7 kg)  10/28/23 233 lb 6.4 oz (105.9 kg)    General: Appears her stated age, obese, in NAD. Skin: Warm, dry and intact.  HEENT: Head: normal shape and size; Eyes: sclera white, no icterus, conjunctiva pink, PERRLA and EOMs intact;  Neck:  Neck supple, trachea midline. No masses, lumps or thyromegaly present.  Cardiovascular: Tachycardic with normal rhythm. S1,S2 noted.  No murmur, rubs or gallops noted. No JVD or BLE edema. No carotid bruits noted. Pulmonary/Chest: Normal effort and positive vesicular breath sounds. No respiratory distress. No wheezes, rales or ronchi noted.  Abdomen: Normal bowel sounds.  Musculoskeletal: Strength 5/5 BUE/BLE.She has difficulty getting from a sitting to standing position. Gait slow and steady without device.  Neurological: Alert and oriented. Cranial nerves II-XII grossly intact. Coordination normal.  Psychiatric: Mood and affect normal. Behavior normal. Judgment and thought content normal.     BMET    Component Value Date/Time   NA 142 09/22/2023 1427   NA 142 09/18/2016 0929   K 3.8 09/22/2023 1427   CL 107 09/22/2023 1427   CO2 26 09/22/2023 1427   GLUCOSE 96 09/22/2023 1427   BUN 10 09/22/2023 1427   BUN 9 09/18/2016 0929   CREATININE 0.60 09/22/2023 1427   CALCIUM  9.9 09/22/2023 1427   GFRNONAA >60  01/15/2022 2334   GFRAA  >60 12/01/2019 1558    Lipid Panel     Component Value Date/Time   CHOL 228 (H) 09/22/2023 1427   CHOL 200 (H) 03/23/2017 0844   TRIG 125 09/22/2023 1427   HDL 67 09/22/2023 1427   HDL 57 03/23/2017 0844   CHOLHDL 3.4 09/22/2023 1427   VLDL 36.2 03/19/2020 0918   LDLCALC 136 (H) 09/22/2023 1427    CBC    Component Value Date/Time   WBC 3.9 09/22/2023 1427   RBC 4.28 09/22/2023 1427   HGB 13.6 09/22/2023 1427   HGB 13.2 09/18/2016 0929   HCT 40.4 09/22/2023 1427   HCT 38.1 09/18/2016 0929   PLT 206 09/22/2023 1427   PLT 268 09/18/2016 0929   MCV 94.4 09/22/2023 1427   MCV 94 09/18/2016 0929   MCH 31.8 09/22/2023 1427   MCHC 33.7 09/22/2023 1427   RDW 13.1 09/22/2023 1427   RDW 12.8 09/18/2016 0929   LYMPHSABS 1,357 03/23/2023 1038   LYMPHSABS 2.8 08/03/2014 1609   MONOABS 0.6 08/30/2019 0315   EOSABS 80 03/23/2023 1038   EOSABS 0.1 08/03/2014 1609   BASOSABS 21 03/23/2023 1038   BASOSABS 0.0 08/03/2014 1609    Hgb A1C Lab Results  Component Value Date   HGBA1C 6.0 (H) 09/22/2023           Assessment & Plan:   Preventative Health Maintenance:  Encouraged her to get a flu shot in the fall Tetanus UTD Encouraged her to get her COVID booster Discussed Shingrix vaccine, she will check coverage with her insurance company and schedule a visit if she would like to have this done She no longer needs Pap smears Mammogram ordered-she will call to schedule Bone density UTD  colon screening UTD Encouraged her to consume a balanced diet and exercise regimen Advised her to see an eye doctor and dentist annually Will check CBC, c-Met, lipid, A1c today  RTC in 6 months, follow-up chronic conditions Angeline Laura, NP

## 2024-01-26 NOTE — Patient Instructions (Signed)
 Health Maintenance for Postmenopausal Women Menopause is a normal process in which your ability to get pregnant comes to an end. This process happens slowly over many months or years, usually between the ages of 76 and 38. Menopause is complete when you have missed your menstrual period for 12 months. It is important to talk with your health care provider about some of the most common conditions that affect women after menopause (postmenopausal women). These include heart disease, cancer, and bone loss (osteoporosis). Adopting a healthy lifestyle and getting preventive care can help to promote your health and wellness. The actions you take can also lower your chances of developing some of these common conditions. What are the signs and symptoms of menopause? During menopause, you may have the following symptoms: Hot flashes. These can be moderate or severe. Night sweats. Decrease in sex drive. Mood swings. Headaches. Tiredness (fatigue). Irritability. Memory problems. Problems falling asleep or staying asleep. Talk with your health care provider about treatment options for your symptoms. Do I need hormone replacement therapy? Hormone replacement therapy is effective in treating symptoms that are caused by menopause, such as hot flashes and night sweats. Hormone replacement carries certain risks, especially as you become older. If you are thinking about using estrogen or estrogen with progestin, discuss the benefits and risks with your health care provider. How can I reduce my risk for heart disease and stroke? The risk of heart disease, heart attack, and stroke increases as you age. One of the causes may be a change in the body's hormones during menopause. This can affect how your body uses dietary fats, triglycerides, and cholesterol. Heart attack and stroke are medical emergencies. There are many things that you can do to help prevent heart disease and stroke. Watch your blood pressure High  blood pressure causes heart disease and increases the risk of stroke. This is more likely to develop in people who have high blood pressure readings or are overweight. Have your blood pressure checked: Every 3-5 years if you are 32-23 years of age. Every year if you are 31 years old or older. Eat a healthy diet  Eat a diet that includes plenty of vegetables, fruits, low-fat dairy products, and lean protein. Do not eat a lot of foods that are high in solid fats, added sugars, or sodium. Get regular exercise Get regular exercise. This is one of the most important things you can do for your health. Most adults should: Try to exercise for at least 150 minutes each week. The exercise should increase your heart rate and make you sweat (moderate-intensity exercise). Try to do strengthening exercises at least twice each week. Do these in addition to the moderate-intensity exercise. Spend less time sitting. Even light physical activity can be beneficial. Other tips Work with your health care provider to achieve or maintain a healthy weight. Do not use any products that contain nicotine or tobacco. These products include cigarettes, chewing tobacco, and vaping devices, such as e-cigarettes. If you need help quitting, ask your health care provider. Know your numbers. Ask your health care provider to check your cholesterol and your blood sugar (glucose). Continue to have your blood tested as directed by your health care provider. Do I need screening for cancer? Depending on your health history and family history, you may need to have cancer screenings at different stages of your life. This may include screening for: Breast cancer. Cervical cancer. Lung cancer. Colorectal cancer. What is my risk for osteoporosis? After menopause, you may be  at increased risk for osteoporosis. Osteoporosis is a condition in which bone destruction happens more quickly than new bone creation. To help prevent osteoporosis or  the bone fractures that can happen because of osteoporosis, you may take the following actions: If you are 24-54 years old, get at least 1,000 mg of calcium and at least 600 international units (IU) of vitamin D  per day. If you are older than age 75 but younger than age 30, get at least 1,200 mg of calcium and at least 600 international units (IU) of vitamin D  per day. If you are older than age 8, get at least 1,200 mg of calcium and at least 800 international units (IU) of vitamin D  per day. Smoking and drinking excessive alcohol increase the risk of osteoporosis. Eat foods that are rich in calcium and vitamin D , and do weight-bearing exercises several times each week as directed by your health care provider. How does menopause affect my mental health? Depression may occur at any age, but it is more common as you become older. Common symptoms of depression include: Feeling depressed. Changes in sleep patterns. Changes in appetite or eating patterns. Feeling an overall lack of motivation or enjoyment of activities that you previously enjoyed. Frequent crying spells. Talk with your health care provider if you think that you are experiencing any of these symptoms. General instructions See your health care provider for regular wellness exams and vaccines. This may include: Scheduling regular health, dental, and eye exams. Getting and maintaining your vaccines. These include: Influenza vaccine. Get this vaccine each year before the flu season begins. Pneumonia vaccine. Shingles vaccine. Tetanus, diphtheria, and pertussis (Tdap) booster vaccine. Your health care provider may also recommend other immunizations. Tell your health care provider if you have ever been abused or do not feel safe at home. Summary Menopause is a normal process in which your ability to get pregnant comes to an end. This condition causes hot flashes, night sweats, decreased interest in sex, mood swings, headaches, or lack  of sleep. Treatment for this condition may include hormone replacement therapy. Take actions to keep yourself healthy, including exercising regularly, eating a healthy diet, watching your weight, and checking your blood pressure and blood sugar levels. Get screened for cancer and depression. Make sure that you are up to date with all your vaccines. This information is not intended to replace advice given to you by your health care provider. Make sure you discuss any questions you have with your health care provider. Document Revised: 10/22/2020 Document Reviewed: 10/22/2020 Elsevier Patient Education  2024 [employer].

## 2024-01-27 ENCOUNTER — Ambulatory Visit: Payer: Self-pay | Admitting: Internal Medicine

## 2024-01-27 LAB — CBC WITH DIFFERENTIAL/PLATELET
Absolute Lymphocytes: 1334 {cells}/uL (ref 850–3900)
Absolute Monocytes: 232 {cells}/uL (ref 200–950)
Basophils Absolute: 11 {cells}/uL (ref 0–200)
Basophils Relative: 0.3 %
Eosinophils Absolute: 72 {cells}/uL (ref 15–500)
Eosinophils Relative: 1.9 %
HCT: 40 % (ref 35.0–45.0)
Hemoglobin: 12.9 g/dL (ref 11.7–15.5)
MCH: 31.6 pg (ref 27.0–33.0)
MCHC: 32.3 g/dL (ref 32.0–36.0)
MCV: 98 fL (ref 80.0–100.0)
MPV: 11.5 fL (ref 7.5–12.5)
Monocytes Relative: 6.1 %
Neutro Abs: 2151 {cells}/uL (ref 1500–7800)
Neutrophils Relative %: 56.6 %
Platelets: 218 Thousand/uL (ref 140–400)
RBC: 4.08 Million/uL (ref 3.80–5.10)
RDW: 13 % (ref 11.0–15.0)
Total Lymphocyte: 35.1 %
WBC: 3.8 Thousand/uL (ref 3.8–10.8)

## 2024-01-27 LAB — HEMOGLOBIN A1C
Hgb A1c MFr Bld: 5.9 % — ABNORMAL HIGH (ref <5.7)
Mean Plasma Glucose: 123 mg/dL
eAG (mmol/L): 6.8 mmol/L

## 2024-01-27 LAB — SEDIMENTATION RATE: Sed Rate: 11 mm/h (ref 0–30)

## 2024-01-27 LAB — LIPID PANEL
Cholesterol: 211 mg/dL — ABNORMAL HIGH (ref <200)
HDL: 72 mg/dL (ref >=50)
LDL Cholesterol (Calc): 118 mg/dL — ABNORMAL HIGH
Non-HDL Cholesterol (Calc): 139 mg/dL — ABNORMAL HIGH (ref <130)
Total CHOL/HDL Ratio: 2.9 (calc) (ref <5.0)
Triglycerides: 105 mg/dL (ref <150)

## 2024-01-27 LAB — C3 AND C4
C3 Complement: 158 mg/dL (ref 83–193)
C4 Complement: 51 mg/dL (ref 15–57)

## 2024-01-27 LAB — PROTEIN / CREATININE RATIO, URINE
Creatinine, Urine: 127 mg/dL (ref 20–275)
Protein/Creat Ratio: 87 mg/g{creat} (ref 24–184)
Protein/Creatinine Ratio: 0.087 mg/mg{creat} (ref 0.024–0.184)
Total Protein, Urine: 11 mg/dL (ref 5–24)

## 2024-01-27 LAB — C-REACTIVE PROTEIN: CRP: 4.3 mg/L (ref <8.0)

## 2024-01-27 MED ORDER — ROSUVASTATIN CALCIUM 10 MG PO TABS: 10.0000 mg | ORAL_TABLET | ORAL | 3 refills | Status: AC

## 2024-01-28 ENCOUNTER — Ambulatory Visit: Attending: Physician Assistant | Admitting: Physician Assistant

## 2024-01-28 ENCOUNTER — Encounter: Payer: Self-pay | Admitting: Physician Assistant

## 2024-01-28 VITALS — BP 131/81 | HR 90 | Resp 14 | Ht 65.0 in | Wt 238.0 lb

## 2024-01-28 DIAGNOSIS — F5102 Adjustment insomnia: Secondary | ICD-10-CM | POA: Diagnosis not present

## 2024-01-28 DIAGNOSIS — K219 Gastro-esophageal reflux disease without esophagitis: Secondary | ICD-10-CM | POA: Diagnosis present

## 2024-01-28 DIAGNOSIS — Z8639 Personal history of other endocrine, nutritional and metabolic disease: Secondary | ICD-10-CM | POA: Diagnosis present

## 2024-01-28 DIAGNOSIS — R202 Paresthesia of skin: Secondary | ICD-10-CM | POA: Diagnosis not present

## 2024-01-28 DIAGNOSIS — M1711 Unilateral primary osteoarthritis, right knee: Secondary | ICD-10-CM | POA: Diagnosis not present

## 2024-01-28 DIAGNOSIS — M3501 Sicca syndrome with keratoconjunctivitis: Secondary | ICD-10-CM | POA: Diagnosis not present

## 2024-01-28 DIAGNOSIS — R519 Headache, unspecified: Secondary | ICD-10-CM | POA: Insufficient documentation

## 2024-01-28 DIAGNOSIS — M5136 Other intervertebral disc degeneration, lumbar region with discogenic back pain only: Secondary | ICD-10-CM | POA: Insufficient documentation

## 2024-01-28 DIAGNOSIS — J302 Other seasonal allergic rhinitis: Secondary | ICD-10-CM | POA: Insufficient documentation

## 2024-01-28 DIAGNOSIS — Z96652 Presence of left artificial knee joint: Secondary | ICD-10-CM | POA: Insufficient documentation

## 2024-01-28 DIAGNOSIS — Z79899 Other long term (current) drug therapy: Secondary | ICD-10-CM | POA: Insufficient documentation

## 2024-01-28 DIAGNOSIS — Z8261 Family history of arthritis: Secondary | ICD-10-CM | POA: Diagnosis present

## 2024-01-28 DIAGNOSIS — M7062 Trochanteric bursitis, left hip: Secondary | ICD-10-CM | POA: Insufficient documentation

## 2024-01-28 DIAGNOSIS — M1990 Unspecified osteoarthritis, unspecified site: Secondary | ICD-10-CM | POA: Diagnosis not present

## 2024-01-28 DIAGNOSIS — M19041 Primary osteoarthritis, right hand: Secondary | ICD-10-CM | POA: Diagnosis not present

## 2024-01-28 DIAGNOSIS — M19042 Primary osteoarthritis, left hand: Secondary | ICD-10-CM | POA: Diagnosis present

## 2024-01-28 DIAGNOSIS — F419 Anxiety disorder, unspecified: Secondary | ICD-10-CM | POA: Diagnosis not present

## 2024-01-28 DIAGNOSIS — M797 Fibromyalgia: Secondary | ICD-10-CM | POA: Insufficient documentation

## 2024-01-28 DIAGNOSIS — E781 Pure hyperglyceridemia: Secondary | ICD-10-CM | POA: Insufficient documentation

## 2024-01-28 DIAGNOSIS — M7061 Trochanteric bursitis, right hip: Secondary | ICD-10-CM | POA: Diagnosis not present

## 2024-01-28 DIAGNOSIS — F32A Depression, unspecified: Secondary | ICD-10-CM | POA: Diagnosis present

## 2024-01-28 DIAGNOSIS — M249 Joint derangement, unspecified: Secondary | ICD-10-CM | POA: Insufficient documentation

## 2024-01-28 LAB — COMPREHENSIVE METABOLIC PANEL WITH GFR
AG Ratio: 1.4 (calc) (ref 1.0–2.5)
ALT: 11 U/L (ref 6–29)
AST: 14 U/L (ref 10–35)
Albumin: 4.2 g/dL (ref 3.6–5.1)
Alkaline phosphatase (APISO): 62 U/L (ref 37–153)
BUN: 13 mg/dL (ref 7–25)
CO2: 25 mmol/L (ref 20–32)
Calcium: 9.6 mg/dL (ref 8.6–10.4)
Chloride: 107 mmol/L (ref 98–110)
Creat: 0.7 mg/dL (ref 0.50–1.03)
Globulin: 3 g/dL (ref 1.9–3.7)
Glucose, Bld: 99 mg/dL (ref 65–99)
Potassium: 4 mmol/L (ref 3.5–5.3)
Sodium: 140 mmol/L (ref 135–146)
Total Bilirubin: 0.4 mg/dL (ref 0.2–1.2)
Total Protein: 7.2 g/dL (ref 6.1–8.1)
eGFR: 101 mL/min/1.73m2 (ref >=60)

## 2024-01-28 MED ORDER — AZATHIOPRINE 50 MG PO TABS: 50.0000 mg | ORAL_TABLET | Freq: Every day | ORAL | 0 refills | Status: DC

## 2024-01-28 NOTE — Patient Instructions (Signed)
 Standing Labs We placed an order today for your standing lab work.   Please have your standing labs drawn in November and every 3 months   Please have your labs drawn 2 weeks prior to your appointment so that the provider can discuss your lab results at your appointment, if possible.  Please note that you may see your imaging and lab results in MyChart before we have reviewed them. We will contact you once all results are reviewed. Please allow our office up to 72 hours to thoroughly review all of the results before contacting the office for clarification of your results.  WALK-IN LAB HOURS  Monday through Thursday from 8:00 am -12:30 pm and 1:00 pm-4:30 pm and Friday from 8:00 am-12:00 pm.  Patients with office visits requiring labs will be seen before walk-in labs.  You may encounter longer than normal wait times. Please allow additional time. Wait times may be shorter on  Monday and Thursday afternoons.  We do not book appointments for walk-in labs. We appreciate your patience and understanding with our staff.   Labs are drawn by [facility]. Please bring your co-pay at the time of your lab draw.  You may receive a bill from [facility] for your lab work.  Please note if you are on Hydroxychloroquine  and and an order has been placed for a Hydroxychloroquine  level,  you will need to have it drawn 4 hours or more after your last dose.  If you wish to have your labs drawn at another location, please call the office 24 hours in advance so we can fax the orders.  The office is located at [address], [place], [place] [address]   If you have any questions regarding directions or hours of operation,  please call [phone].   As a reminder, please drink plenty of water prior to coming for your lab work. Thanks!

## 2024-01-29 ENCOUNTER — Ambulatory Visit: Payer: Self-pay | Admitting: Physician Assistant

## 2024-01-29 DIAGNOSIS — M2042 Other hammer toe(s) (acquired), left foot: Secondary | ICD-10-CM | POA: Diagnosis not present

## 2024-01-29 DIAGNOSIS — E1165 Type 2 diabetes mellitus with hyperglycemia: Secondary | ICD-10-CM | POA: Diagnosis not present

## 2024-01-29 DIAGNOSIS — M2041 Other hammer toe(s) (acquired), right foot: Secondary | ICD-10-CM | POA: Diagnosis not present

## 2024-01-29 NOTE — Progress Notes (Signed)
 CMP WNL

## 2024-01-30 ENCOUNTER — Other Ambulatory Visit: Payer: Self-pay | Admitting: Internal Medicine

## 2024-02-02 NOTE — Telephone Encounter (Signed)
 Requested medication (s) are due for refill today: yes  Requested medication (s) are on the active medication list: yes  Last refill:  10/15/23 #60  Future visit scheduled: yes  Notes to clinic:  med not delegated to NT to RF   Requested Prescriptions  Pending Prescriptions Disp Refills   pregabalin  (LYRICA ) 50 MG capsule [Pharmacy Med Name: PREGABALIN  50 MG CAPSULE] 60 capsule 0    Sig: TAKE 1 CAPSULE BY MOUTH 2 TIMES DAILY.     Not Delegated - Neurology:  Anticonvulsants - Controlled - pregabalin  Failed - 02/02/2024  1:36 PM      Failed - This refill cannot be delegated      Passed - Cr in normal range and within 360 days    Creat  Date Value Ref Range Status  01/28/2024 0.70 0.50 - 1.03 mg/dL Final   Creatinine, Urine  Date Value Ref Range Status  01/26/2024 127 20 - 275 mg/dL Final         Passed - Completed PHQ-2 or PHQ-9 in the last 360 days      Passed - Valid encounter within last 12 months    Recent Outpatient Visits           1 week ago Encounter for general adult medical examination with abnormal findings   [facility] [place], [place] W, NP   4 weeks ago Herpes zoster without complication   [facility] [place], Angeline ORN, NP   3 months ago Fibromyalgia   [facility] [place], Angeline ORN, NP   4 months ago Ulcerated external hemorrhoids    [facility] [place], Angeline ORN, NP   4 months ago Rectal pain    [facility] [place], Angeline ORN, NP       Future Appointments             In 4 months Cheryl, [place] CHRISTELLA RIGGERS [facility] - A Dept Of [place]

## 2024-02-08 ENCOUNTER — Ambulatory Visit
Admission: RE | Admit: 2024-02-08 | Discharge: 2024-02-08 | Disposition: A | Discharge status: Home / Self Care | Source: Ambulatory Visit | Attending: Internal Medicine | Admitting: Internal Medicine

## 2024-02-08 DIAGNOSIS — Z1231 Encounter for screening mammogram for malignant neoplasm of breast: Secondary | ICD-10-CM | POA: Diagnosis not present

## 2024-02-25 ENCOUNTER — Telehealth: Admitting: Internal Medicine

## 2024-02-25 ENCOUNTER — Encounter: Payer: Self-pay | Admitting: Internal Medicine

## 2024-02-25 DIAGNOSIS — M5136 Other intervertebral disc degeneration, lumbar region with discogenic back pain only: Secondary | ICD-10-CM

## 2024-02-25 DIAGNOSIS — M19041 Primary osteoarthritis, right hand: Secondary | ICD-10-CM

## 2024-02-25 DIAGNOSIS — M19042 Primary osteoarthritis, left hand: Secondary | ICD-10-CM

## 2024-02-25 DIAGNOSIS — M797 Fibromyalgia: Secondary | ICD-10-CM

## 2024-02-25 MED ORDER — PREGABALIN 100 MG PO CAPS: 100.0000 mg | ORAL_CAPSULE | Freq: Two times a day (BID) | ORAL | 0 refills | Status: DC

## 2024-02-25 NOTE — Patient Instructions (Signed)
Myofascial Pain Syndrome and Fibromyalgia Myofascial pain syndrome and fibromyalgia are both pain disorders. You may feel this pain mainly in your muscles. Myofascial pain syndrome: Always has tender points in the muscles that will cause pain when pressed (trigger points). The pain may come and go. Usually affects your neck, upper back, and shoulder areas. The pain often moves into your arms and hands. Fibromyalgia: Has muscle pains and tenderness that come and go. Is often associated with tiredness (fatigue) and sleep problems. Has trigger points. Tends to be long-lasting (chronic), but is not life-threatening. Fibromyalgia and myofascial pain syndrome are not the same. However, they often occur together. If you have both conditions, each can make the other worse. Both are common and can cause enough pain and fatigue to make day-to-day activities difficult. Both can be hard to diagnose because their symptoms are common in many other conditions. What are the causes? The exact causes of these conditions are not known. What increases the risk? You are more likely to develop either of these conditions if: You have a family history of the condition. You are female. You have certain triggers, such as: Spine disorders. An injury (trauma) or other physical stressors. Being under a lot of stress. Medical conditions such as osteoarthritis, rheumatoid arthritis, or lupus. What are the signs or symptoms? Fibromyalgia The main symptom of fibromyalgia is widespread pain and tenderness in your muscles. Pain is sometimes described as stabbing, shooting, or burning. You may also have: Tingling or numbness. Sleep problems and fatigue. Problems with attention and concentration (fibro fog). Other symptoms may include: Bowel and bladder problems. Headaches. Vision problems. Sensitivity to odors and noises. Depression or mood changes. Painful menstrual periods (dysmenorrhea). Dry skin or eyes. These  symptoms can vary over time. Myofascial pain syndrome Symptoms of myofascial pain syndrome include: Tight, ropy bands of muscle. Uncomfortable sensations in muscle areas. These may include aching, cramping, burning, numbness, tingling, and weakness. Difficulty moving certain parts of the body freely (poor range of motion). How is this diagnosed? This condition may be diagnosed by your symptoms and medical history. You will also have a physical exam. In general: Fibromyalgia is diagnosed if you have pain, fatigue, and other symptoms for more than 3 months, and symptoms cannot be explained by another condition. Myofascial pain syndrome is diagnosed if you have trigger points in your muscles, and those trigger points are tender and cause pain elsewhere in your body (referred pain). How is this treated? Treatment for these conditions depends on the type that you have. For fibromyalgia, a healthy lifestyle is the most important treatment including aerobic and strength exercises. Different types of medicines are used to help treat pain and include: NSAIDs. Medicines for treating depression. Medicines that help control seizures. Medicines that relax the muscles. Treatment for myofascial pain syndrome includes: Pain medicines, such as NSAIDs. Cooling and stretching of muscles. Massage therapy with myofascial release technique. Trigger point injections. Treating these conditions often requires a team of health care providers. These may include: Your primary care provider. A physical therapist. Complementary health care providers, such as massage therapists or acupuncturists. A psychiatrist for cognitive behavioral therapy. Follow these instructions at home: Medicines Take over-the-counter and prescription medicines only as told by your health care provider. Ask your health care provider if the medicine prescribed to you: Requires you to avoid driving or using machinery. Can cause constipation.  You may need to take these actions to prevent or treat constipation: Drink enough fluid to keep your urine pale   yellow. Take over-the-counter or prescription medicines. Eat foods that are high in fiber, such as beans, whole grains, and fresh fruits and vegetables. Limit foods that are high in fat and processed sugars, such as fried or sweet foods. Lifestyle  Do exercises as told by your health care provider or physical therapist. Practice relaxation techniques to control your stress. You may want to try: Biofeedback. Visual imagery. Hypnosis. Muscle relaxation. Yoga. Meditation. Maintain a healthy lifestyle. This includes eating a healthy diet and getting enough sleep. Do not use any products that contain nicotine or tobacco. These products include cigarettes, chewing tobacco, and vaping devices, such as e-cigarettes. If you need help quitting, ask your health care provider. General instructions Talk to your health care provider about complementary treatments, such as acupuncture or massage. Do not do activities that stress or strain your muscles. This includes repetitive motions and heavy lifting. Keep all follow-up visits. This is important. Where to find support Consider joining a support group with others who are diagnosed with this condition. National Fibromyalgia Association: fmaware.org Where to find more information U.S. Pain Foundation: uspainfoundation.org Contact a health care provider if: You have new symptoms. Your symptoms get worse or your pain is severe. You have side effects from your medicines. You have trouble sleeping. Your condition is causing depression or anxiety. Get help right away if: You have thoughts of hurting yourself or others. Get help right away if you feel like you may hurt yourself or others, or have thoughts about taking your own life. Go to your nearest emergency room or: Call 911. Call the National Suicide Prevention Lifeline at 1-800-273-8255  or 988. This is open 24 hours a day. Text the Crisis Text Line at 741741. This information is not intended to replace advice given to you by your health care provider. Make sure you discuss any questions you have with your health care provider. Document Revised: 03/10/2022 Document Reviewed: 05/03/2021 Elsevier Patient Education  2024 Elsevier Inc.  

## 2024-02-25 NOTE — Progress Notes (Signed)
 Virtual Visit via Video Note  I connected with [name] on 02/25/24 at  8:20 AM EDT by a video enabled telemedicine application and verified that I am speaking with the correct person using two identifiers.  Location: Patient: Work Provider: Engineer, structural in this video call: Angeline Laura, NP-C and Deziyah Arvin   I discussed the limitations of evaluation and management by telemedicine and the availability of in person appointments. The patient expressed understanding and agreed to proceed.  History of Present Illness:  Discussed the use of AI scribe software for clinical note transcription with the patient, who gave verbal consent to proceed.  [name] is a 58 year old female with fibromyalgia, osteoarthritis and chronic back pain who presents with a severe flare-up of pain and sleep disturbances.  She is experiencing a significant flare-up of her fibromyalgia symptoms, which began late last week and worsened a couple of days ago. The pain is localized to specific areas including behind her shoulders, hands, forearms, inner knees, and feet, with accompanying tingling sensations. The severity of the pain has disrupted her sleep, contributing to her distress.  The flare-up coincided with increased stress from her job, where she is being pressured to work more hours despite her disability status. 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 She feels pressured to continue working despite her health challenges.  She is currently taking several medications to manage her symptoms, including Lyrica  50 mg twice a day, which she feels is no longer effective. She also takes meloxicam  15 mg daily, methocarbamol , tylenol  and occasionally uses cyclobenzaprine  and tramadol  for severe pain.   She has a history of back issues and was previously referred to a pain specialist. She had been seeing  [facility] for her knee and back pain but was not willing to undergo back surgery at the time. She recalls that her previous providers told her that injections would not help her back anymore.      Past Medical History:  Diagnosis Date   Allergy    Anemia    h/o   Anxiety    Arthritis    DDD (degenerative disc disease), lumbar    Depression    Diabetes mellitus, type 2 (HCC)    Fibromyalgia    GERD (gastroesophageal reflux disease)    Morbid obesity (HCC)    Pneumonia 01/2022   PONV (postoperative nausea and vomiting)    Pre-diabetes    Shingles    Sjogren's syndrome (HCC)     Current Outpatient Medications  Medication Sig Dispense Refill   acetaminophen  (TYLENOL  8 HOUR ARTHRITIS PAIN) 650 MG CR tablet Take 1,300 mg by mouth daily.     azaTHIOprine  (IMURAN ) 50 MG tablet Take 1 tablet (50 mg total) by mouth daily. 90 tablet 0   Blood Glucose Monitoring Suppl DEVI 1 each by Does not apply route in the morning, at noon, and at bedtime. May substitute to any manufacturer covered by patient's insurance. 1 each 3   Cinnamon-Chromium-Biotin ([employer] CINNAMON COMPLEX PO) Take by mouth.     cyclobenzaprine  (FLEXERIL ) 10 MG tablet TAKE 1 TABLET BY MOUTH DAILY AS NEEDED FOR MUSCLE SPASMS. 30 tablet 0   eszopiclone  (LUNESTA ) 2 MG TABS tablet Take 1 tablet (2 mg total) by mouth at bedtime as needed for sleep. Take immediately before bedtime 30 tablet 0   famotidine  (PEPCID ) 20 MG tablet TAKE 1 TABLET (20 MG TOTAL)  BY MOUTH 2 (TWO) TIMES DAILY AS NEEDED FOR HEARTBURN OR INDIGESTION. 180 tablet 0   fexofenadine -pseudoephedrine (ALLEGRA-D 24) 180-240 MG 24 hr tablet Take 1 tablet by mouth daily. 180 tablet 0   fluticasone  (FLONASE  ALLERGY RELIEF) 50 MCG/ACT nasal spray  (Patient not taking: Reported on 01/28/2024)     glucose blood test strip Use 1-3 times daily as needed DX E11.65 100 each 12   hydrOXYzine  (VISTARIL ) 25 MG capsule TAKE 1 CAPSULE (25 MG TOTAL) BY MOUTH DAILY AS NEEDED. 90 capsule 1    ibuprofen  (ADVIL ) 800 MG tablet TAKE 1 TABLET (800 MG TOTAL) BY MOUTH DAILY AS NEEDED 30 tablet 0   meloxicam  (MOBIC ) 15 MG tablet Take 15 mg by mouth as needed for pain.     meloxicam  (MOBIC ) 7.5 MG tablet Take 7.5-15 mg by mouth daily.     methocarbamol  (ROBAXIN ) 500 MG tablet Take 1 tablet (500 mg total) by mouth 3 (three) times daily as needed. 90 tablet 1   Multiple Vitamin (MULTIVITAMIN PO) Take by mouth daily.     pantoprazole  (PROTONIX ) 40 MG tablet TAKE 1 TABLET BY MOUTH TWICE A DAY 180 tablet 1   pregabalin  (LYRICA ) 50 MG capsule TAKE 1 CAPSULE BY MOUTH 2 TIMES DAILY. 60 capsule 0   rosuvastatin  (CRESTOR ) 10 MG tablet Take 1 tablet (10 mg total) by mouth 3 (three) times a week. 45 tablet 3   traMADol  (ULTRAM ) 50 MG tablet Take 1 tablet (50 mg total) by mouth every 6 (six) hours as needed. 30 tablet 0   No current facility-administered medications for this visit.    Allergies  Allergen Reactions   Hydrochlorothiazide  Other (See Comments)    The pt state she couldn't function and felt like she was dying    Lipitor [Atorvastatin ] Other (See Comments)    Muscle pain   Orange Juice [Orange Oil] Rash    rash   Penicillins Other (See Comments)    Childhood allergy Has patient had a PCN reaction causing immediate rash, facial/tongue/throat swelling, SOB or lightheadedness with hypotension: Unknown Has patient had a PCN reaction causing severe rash involving mucus membranes or skin necrosis: Unknown Has patient had a PCN reaction that required hospitalization: No  Has patient had a PCN reaction occurring within the last 10 years: No  If all of the above answers are NO, then may proceed with Cephalosporin use.     Family History  Problem Relation Age of Onset   Hyperlipidemia Mother    Fibroids Sister    Fibroids Sister    Fibroids Sister    Arthritis Sister    Osteoarthritis Sister    Osteoarthritis Sister    Arthritis Sister    Obesity Sister    ADD / ADHD Daughter     Heart disease Maternal Aunt    Stroke Maternal Aunt    Diabetes Maternal Aunt    Heart disease Maternal Uncle    Stroke Maternal Uncle    Rheum arthritis Nephew    Cancer Neg Hx     Social History   Socioeconomic History   Marital status: Married    Spouse name: Not on file   Number of children: 2   Years of education: Not on file   Highest education level: Some college, no degree  Occupational History   Occupation: retail  Tobacco Use   Smoking status: Never    Passive exposure: Current   Smokeless tobacco: Never  Vaping Use   Vaping status: Never Used  Substance  and Sexual Activity   Alcohol use: Yes    Alcohol/week: 5.0 standard drinks of alcohol    Types: 1 Glasses of wine, 4 Cans of beer per week    Comment: occassionally   Drug use: Not Currently    Types: Marijuana   Sexual activity: Yes    Partners: Male    Birth control/protection: Post-menopausal, Surgical  Other Topics Concern   Not on file  Social History Narrative   Not on file   Social Drivers of Health   Financial Resource Strain: Low Risk  (01/05/2024)   Overall Financial Resource Strain (CARDIA)    Difficulty of Paying Living Expenses: Not hard at all  Food Insecurity: No Food Insecurity (01/05/2024)   Hunger Vital Sign    Worried About Running Out of Food in the Last Year: Never true    Ran Out of Food in the Last Year: Never true  Transportation Needs: No Transportation Needs (01/05/2024)   PRAPARE - Administrator, Civil Service (Medical): No    Lack of Transportation (Non-Medical): No  Physical Activity: Insufficiently Active (01/05/2024)   Exercise Vital Sign    Days of Exercise per Week: 3 days    Minutes of Exercise per Session: 30 min  Stress: No Stress Concern Present (01/05/2024)   [employer] of Occupational Health - Occupational Stress Questionnaire    Feeling of Stress: Only a little  Social Connections: Moderately Integrated (01/05/2024)   Social Connection and  Isolation Panel    Frequency of Communication with Friends and Family: More than three times a week    Frequency of Social Gatherings with Friends and Family: More than three times a week    Attends Religious Services: 1 to 4 times per year    Active Member of [employer] or Organizations: No    Attends Banker Meetings: Not on file    Marital Status: Married  Catering manager Violence: Not At Risk (06/03/2022)   Humiliation, Afraid, Rape, and Kick questionnaire    Fear of Current or Ex-Partner: No    Emotionally Abused: No    Physically Abused: No    Sexually Abused: No    Constitutional: Patient reports intermittent headaches.  Denies fever, malaise, fatigue, or abrupt weight changes.  HEENT: Denies eye pain, eye redness, ear pain, ringing in the ears, wax buildup, runny nose, nasal congestion, bloody nose, or sore throat. Respiratory: Denies difficulty breathing, shortness of breath, cough or sputum production.   Cardiovascular: Denies chest pain, chest tightness, palpitations or swelling in the hands or feet.  Gastrointestinal: Denies abdominal pain, bloating, constipation, diarrhea or blood in the stool.  GU: Denies urgency, frequency, pain with urination, burning sensation, blood in urine, odor or discharge. Musculoskeletal: Patient reports chronic joint and muscle pain.  Denies decrease in range of motion, difficulty with gait, or joint swelling.  Skin: Denies redness, rashes, lesions or ulcercations.  Neurological: Patient reports insomnia.  Denies dizziness, difficulty with memory, difficulty with speech or problems with balance and coordination.  Psych: Patient has a history of anxiety and depression.  Denies SI/HI.   No other specific complaints in a complete review of systems (except as listed in HPI above).   Observations/Objective:   Wt Readings from Last 3 Encounters:  01/28/24 238 lb (108 kg)  01/26/24 237 lb 6.4 oz (107.7 kg)  01/05/24 237 lb 3.2 oz (107.6  kg)    General: Appears her stated age,obese, in NAD. Pulmonary/Chest: Normal effort. No respiratory distress.  Neurological:  Alert and oriented.  Psychiatric: Tearful. Judgment and thought content normal.   BMET    Component Value Date/Time   NA 140 01/28/2024 1432   NA 142 09/18/2016 0929   K 4.0 01/28/2024 1432   CL 107 01/28/2024 1432   CO2 25 01/28/2024 1432   GLUCOSE 99 01/28/2024 1432   BUN 13 01/28/2024 1432   BUN 9 09/18/2016 0929   CREATININE 0.70 01/28/2024 1432   CALCIUM  9.6 01/28/2024 1432   GFRNONAA >60 01/15/2022 2334   GFRAA >60 12/01/2019 1558    Lipid Panel     Component Value Date/Time   CHOL 211 (H) 01/26/2024 1119   CHOL 200 (H) 03/23/2017 0844   TRIG 105 01/26/2024 1119   HDL 72 01/26/2024 1119   HDL 57 03/23/2017 0844   CHOLHDL 2.9 01/26/2024 1119   VLDL 36.2 03/19/2020 0918   LDLCALC 118 (H) 01/26/2024 1119    CBC    Component Value Date/Time   WBC 3.8 01/26/2024 1119   RBC 4.08 01/26/2024 1119   HGB 12.9 01/26/2024 1119   HGB 13.2 09/18/2016 0929   HCT 40.0 01/26/2024 1119   HCT 38.1 09/18/2016 0929   PLT 218 01/26/2024 1119   PLT 268 09/18/2016 0929   MCV 98.0 01/26/2024 1119   MCV 94 09/18/2016 0929   MCH 31.6 01/26/2024 1119   MCHC 32.3 01/26/2024 1119   RDW 13.0 01/26/2024 1119   RDW 12.8 09/18/2016 0929   LYMPHSABS 1,357 03/23/2023 1038   LYMPHSABS 2.8 08/03/2014 1609   MONOABS 0.6 08/30/2019 0315   EOSABS 72 01/26/2024 1119   EOSABS 0.1 08/03/2014 1609   BASOSABS 11 01/26/2024 1119   BASOSABS 0.0 08/03/2014 1609    Hgb A1C Lab Results  Component Value Date   HGBA1C 5.9 (H) 01/26/2024       Assessment and Plan:  Assessment and Plan    Fibromyalgia, osteoarthritis with chronic pain exacerbation Fibromyalgia flare-up with severe pain in trigger points. Current Lyrica  dose ineffective. - Increase Lyrica  to 100 mg twice daily. - Advise to take two 50 mg Lyrica  twice daily until new prescription is available. -  Encourage stress reduction and adequate rest. - Advise to continue current medications including meloxicam , tylenol , tramadol , cyclobenzaprine , and methocarbamol  as needed.  Chronic back pain Chronic back pain with worsening symptoms. Previous treatments ineffective. - Refer to Dr. Naviera, pain specialist, for further evaluation and management. - Advise to rest and perform gentle stretching exercises. - Encourage walking in moderation.     RTC in 5 months for follow-up chronic conditions  Follow Up Instructions:    I discussed the assessment and treatment plan with the patient. The patient was provided an opportunity to ask questions and all were answered. The patient agreed with the plan and demonstrated an understanding of the instructions.   The patient was advised to call back or seek an in-person evaluation if the symptoms worsen or if the condition fails to improve as anticipated.   Angeline Laura, NP

## 2024-03-20 ENCOUNTER — Other Ambulatory Visit: Payer: Self-pay | Admitting: Internal Medicine

## 2024-03-21 ENCOUNTER — Telehealth: Payer: Self-pay

## 2024-03-21 ENCOUNTER — Other Ambulatory Visit: Payer: Self-pay | Admitting: Internal Medicine

## 2024-03-21 NOTE — Telephone Encounter (Signed)
 Refill on Tramadol  per patient's request.

## 2024-03-24 ENCOUNTER — Encounter: Admitting: Internal Medicine

## 2024-03-28 ENCOUNTER — Ambulatory Visit: Admitting: Internal Medicine

## 2024-03-28 VITALS — BP 130/84 | Ht 65.0 in | Wt 244.4 lb

## 2024-03-28 DIAGNOSIS — Z23 Encounter for immunization: Secondary | ICD-10-CM

## 2024-03-28 DIAGNOSIS — M5136 Other intervertebral disc degeneration, lumbar region with discogenic back pain only: Secondary | ICD-10-CM

## 2024-03-28 DIAGNOSIS — M19042 Primary osteoarthritis, left hand: Secondary | ICD-10-CM | POA: Diagnosis not present

## 2024-03-28 DIAGNOSIS — M797 Fibromyalgia: Secondary | ICD-10-CM

## 2024-03-28 DIAGNOSIS — M19041 Primary osteoarthritis, right hand: Secondary | ICD-10-CM | POA: Diagnosis not present

## 2024-03-28 MED ORDER — PREGABALIN 50 MG PO CAPS: 50.0000 mg | ORAL_CAPSULE | Freq: Two times a day (BID) | ORAL | 0 refills | Status: DC

## 2024-03-28 NOTE — Patient Instructions (Signed)
Myofascial Pain Syndrome and Fibromyalgia Myofascial pain syndrome and fibromyalgia are both pain disorders. You may feel this pain mainly in your muscles. Myofascial pain syndrome: Always has tender points in the muscles that will cause pain when pressed (trigger points). The pain may come and go. Usually affects your neck, upper back, and shoulder areas. The pain often moves into your arms and hands. Fibromyalgia: Has muscle pains and tenderness that come and go. Is often associated with tiredness (fatigue) and sleep problems. Has trigger points. Tends to be long-lasting (chronic), but is not life-threatening. Fibromyalgia and myofascial pain syndrome are not the same. However, they often occur together. If you have both conditions, each can make the other worse. Both are common and can cause enough pain and fatigue to make day-to-day activities difficult. Both can be hard to diagnose because their symptoms are common in many other conditions. What are the causes? The exact causes of these conditions are not known. What increases the risk? You are more likely to develop either of these conditions if: You have a family history of the condition. You are female. You have certain triggers, such as: Spine disorders. An injury (trauma) or other physical stressors. Being under a lot of stress. Medical conditions such as osteoarthritis, rheumatoid arthritis, or lupus. What are the signs or symptoms? Fibromyalgia The main symptom of fibromyalgia is widespread pain and tenderness in your muscles. Pain is sometimes described as stabbing, shooting, or burning. You may also have: Tingling or numbness. Sleep problems and fatigue. Problems with attention and concentration (fibro fog). Other symptoms may include: Bowel and bladder problems. Headaches. Vision problems. Sensitivity to odors and noises. Depression or mood changes. Painful menstrual periods (dysmenorrhea). Dry skin or eyes. These  symptoms can vary over time. Myofascial pain syndrome Symptoms of myofascial pain syndrome include: Tight, ropy bands of muscle. Uncomfortable sensations in muscle areas. These may include aching, cramping, burning, numbness, tingling, and weakness. Difficulty moving certain parts of the body freely (poor range of motion). How is this diagnosed? This condition may be diagnosed by your symptoms and medical history. You will also have a physical exam. In general: Fibromyalgia is diagnosed if you have pain, fatigue, and other symptoms for more than 3 months, and symptoms cannot be explained by another condition. Myofascial pain syndrome is diagnosed if you have trigger points in your muscles, and those trigger points are tender and cause pain elsewhere in your body (referred pain). How is this treated? Treatment for these conditions depends on the type that you have. For fibromyalgia, a healthy lifestyle is the most important treatment including aerobic and strength exercises. Different types of medicines are used to help treat pain and include: NSAIDs. Medicines for treating depression. Medicines that help control seizures. Medicines that relax the muscles. Treatment for myofascial pain syndrome includes: Pain medicines, such as NSAIDs. Cooling and stretching of muscles. Massage therapy with myofascial release technique. Trigger point injections. Treating these conditions often requires a team of health care providers. These may include: Your primary care provider. A physical therapist. Complementary health care providers, such as massage therapists or acupuncturists. A psychiatrist for cognitive behavioral therapy. Follow these instructions at home: Medicines Take over-the-counter and prescription medicines only as told by your health care provider. Ask your health care provider if the medicine prescribed to you: Requires you to avoid driving or using machinery. Can cause constipation.  You may need to take these actions to prevent or treat constipation: Drink enough fluid to keep your urine pale   yellow. Take over-the-counter or prescription medicines. Eat foods that are high in fiber, such as beans, whole grains, and fresh fruits and vegetables. Limit foods that are high in fat and processed sugars, such as fried or sweet foods. Lifestyle  Do exercises as told by your health care provider or physical therapist. Practice relaxation techniques to control your stress. You may want to try: Biofeedback. Visual imagery. Hypnosis. Muscle relaxation. Yoga. Meditation. Maintain a healthy lifestyle. This includes eating a healthy diet and getting enough sleep. Do not use any products that contain nicotine or tobacco. These products include cigarettes, chewing tobacco, and vaping devices, such as e-cigarettes. If you need help quitting, ask your health care provider. General instructions Talk to your health care provider about complementary treatments, such as acupuncture or massage. Do not do activities that stress or strain your muscles. This includes repetitive motions and heavy lifting. Keep all follow-up visits. This is important. Where to find support Consider joining a support group with others who are diagnosed with this condition. National Fibromyalgia Association: fmaware.org Where to find more information U.S. Pain Foundation: uspainfoundation.org Contact a health care provider if: You have new symptoms. Your symptoms get worse or your pain is severe. You have side effects from your medicines. You have trouble sleeping. Your condition is causing depression or anxiety. Get help right away if: You have thoughts of hurting yourself or others. Get help right away if you feel like you may hurt yourself or others, or have thoughts about taking your own life. Go to your nearest emergency room or: Call 911. Call the National Suicide Prevention Lifeline at 1-800-273-8255  or 988. This is open 24 hours a day. Text the Crisis Text Line at 741741. This information is not intended to replace advice given to you by your health care provider. Make sure you discuss any questions you have with your health care provider. Document Revised: 03/10/2022 Document Reviewed: 05/03/2021 Elsevier Patient Education  2024 Elsevier Inc.  

## 2024-03-28 NOTE — Progress Notes (Signed)
 Subjective:    Patient ID: [name], female    DOB: [birth year], 58 y.o.   MRN: [id number]  HPI  Discussed the use of AI scribe software for clinical note transcription with the patient, who gave verbal consent to proceed.  [name] is a 58 year old female with osteoarthritis, fibromyalgia, and sjogren's syndrome who presents with muscle weakness and bladder issues.  She has been experiencing muscle weakness for a couple of weeks, initially in her left arm, which she thought was a pulled muscle. The weakness has not improved and is now affecting both arms, causing pain when lifting objects. Additionally, there is a heavy feeling in her legs when climbing stairs. These symptoms began after increasing her Lyrica  dosage from 50 mg once daily to 100 mg twice daily.  She also has bladder issues, specifically incontinence, which she associates with the increased Lyrica  dosage. She denies urgency, frequency, dysuria or blood in her urine.  She is scheduled to see a pain management specialist on the 22nd of this month.  She has a history of osteoarthritis, fibromyalgia, and sjogren's syndrome, and she sees a rheumatologist for these conditions.    Review of Systems     Past Medical History:  Diagnosis Date   Allergy    Anemia    h/o   Anxiety    Arthritis    DDD (degenerative disc disease), lumbar    Depression    Diabetes mellitus, type 2 (HCC)    Fibromyalgia    GERD (gastroesophageal reflux disease)    Morbid obesity (HCC)    Pneumonia 01/2022   PONV (postoperative nausea and vomiting)    Pre-diabetes    Shingles    Sjogren's syndrome     Current Outpatient Medications  Medication Sig Dispense Refill   acetaminophen  (TYLENOL  8 HOUR ARTHRITIS PAIN) 650 MG CR tablet Take 1,300 mg by mouth daily.     azaTHIOprine  (IMURAN ) 50 MG tablet Take 1 tablet (50 mg total) by mouth daily. 90 tablet 0   Blood Glucose Monitoring Suppl DEVI 1 each by Does not  apply route in the morning, at noon, and at bedtime. May substitute to any manufacturer covered by patient's insurance. 1 each 3   Cinnamon-Chromium-Biotin ([facility] CINNAMON COMPLEX PO) Take by mouth.     cyclobenzaprine  (FLEXERIL ) 10 MG tablet TAKE 1 TABLET BY MOUTH DAILY AS NEEDED FOR MUSCLE SPASMS. 30 tablet 0   eszopiclone  (LUNESTA ) 2 MG TABS tablet Take 1 tablet (2 mg total) by mouth at bedtime as needed for sleep. Take immediately before bedtime 30 tablet 0   famotidine  (PEPCID ) 20 MG tablet TAKE 1 TABLET (20 MG TOTAL) BY MOUTH 2 (TWO) TIMES DAILY AS NEEDED FOR HEARTBURN OR INDIGESTION. 180 tablet 0   fexofenadine -pseudoephedrine (ALLEGRA-D 24) 180-240 MG 24 hr tablet Take 1 tablet by mouth daily. 180 tablet 0   fluticasone  (FLONASE  ALLERGY RELIEF) 50 MCG/ACT nasal spray  (Patient not taking: Reported on 01/28/2024)     glucose blood test strip Use 1-3 times daily as needed DX E11.65 100 each 12   hydrOXYzine  (VISTARIL ) 25 MG capsule TAKE 1 CAPSULE (25 MG TOTAL) BY MOUTH DAILY AS NEEDED. 90 capsule 1   ibuprofen  (ADVIL ) 800 MG tablet TAKE 1 TABLET (800 MG TOTAL) BY MOUTH DAILY AS NEEDED 30 tablet 0   meloxicam  (MOBIC ) 15 MG tablet Take 15 mg by mouth as needed for pain.     meloxicam  (MOBIC ) 7.5 MG tablet Take 7.5-15 mg by mouth  daily.     methocarbamol  (ROBAXIN ) 500 MG tablet Take 1 tablet (500 mg total) by mouth 3 (three) times daily as needed. 90 tablet 1   Multiple Vitamin (MULTIVITAMIN PO) Take by mouth daily.     pantoprazole  (PROTONIX ) 40 MG tablet TAKE 1 TABLET BY MOUTH TWICE A DAY 180 tablet 1   pregabalin  (LYRICA ) 100 MG capsule Take 1 capsule (100 mg total) by mouth 2 (two) times daily. 60 capsule 0   rosuvastatin  (CRESTOR ) 10 MG tablet Take 1 tablet (10 mg total) by mouth 3 (three) times a week. 45 tablet 3   traMADol  (ULTRAM ) 50 MG tablet TAKE 1 TABLET BY MOUTH EVERY 6 HOURS AS NEEDED. 30 tablet 0   No current facility-administered medications for this visit.    Allergies   Allergen Reactions   Hydrochlorothiazide  Other (See Comments)    The pt state she couldn't function and felt like she was dying    Lipitor [Atorvastatin ] Other (See Comments)    Muscle pain   Orange Juice [Orange Oil] Rash    rash   Penicillins Other (See Comments)    Childhood allergy Has patient had a PCN reaction causing immediate rash, facial/tongue/throat swelling, SOB or lightheadedness with hypotension: Unknown Has patient had a PCN reaction causing severe rash involving mucus membranes or skin necrosis: Unknown Has patient had a PCN reaction that required hospitalization: No  Has patient had a PCN reaction occurring within the last 10 years: No  If all of the above answers are NO, then may proceed with Cephalosporin use.     Family History  Problem Relation Age of Onset   Hyperlipidemia Mother    Fibroids Sister    Fibroids Sister    Fibroids Sister    Arthritis Sister    Osteoarthritis Sister    Osteoarthritis Sister    Arthritis Sister    Obesity Sister    ADD / ADHD Daughter    Heart disease Maternal Aunt    Stroke Maternal Aunt    Diabetes Maternal Aunt    Heart disease Maternal Uncle    Stroke Maternal Uncle    Rheum arthritis Nephew    Cancer Neg Hx     Social History   Socioeconomic History   Marital status: Married    Spouse name: Not on file   Number of children: 2   Years of education: Not on file   Highest education level: Some college, no degree  Occupational History   Occupation: retail  Tobacco Use   Smoking status: Never    Passive exposure: Current   Smokeless tobacco: Never  Vaping Use   Vaping status: Never Used  Substance and Sexual Activity   Alcohol use: Yes    Alcohol/week: 5.0 standard drinks of alcohol    Types: 1 Glasses of wine, 4 Cans of beer per week    Comment: occassionally   Drug use: Not Currently    Types: Marijuana   Sexual activity: Yes    Partners: Male    Birth control/protection: Post-menopausal, Surgical   Other Topics Concern   Not on file  Social History Narrative   Not on file   Social Drivers of Health   Financial Resource Strain: Low Risk  (03/28/2024)   Overall Financial Resource Strain (CARDIA)    Difficulty of Paying Living Expenses: Not hard at all  Food Insecurity: Food Insecurity Present (03/28/2024)   Hunger Vital Sign    Worried About Running Out of Food in the Last Year: Sometimes  true    Ran Out of Food in the Last Year: Sometimes true  Transportation Needs: No Transportation Needs (03/28/2024)   PRAPARE - Administrator, Civil Service (Medical): No    Lack of Transportation (Non-Medical): No  Physical Activity: Insufficiently Active (03/28/2024)   Exercise Vital Sign    Days of Exercise per Week: 3 days    Minutes of Exercise per Session: 30 min  Stress: Stress Concern Present (03/28/2024)   [employer] of Occupational Health - Occupational Stress Questionnaire    Feeling of Stress: To some extent  Social Connections: Moderately Integrated (03/28/2024)   Social Connection and Isolation Panel    Frequency of Communication with Friends and Family: Three times a week    Frequency of Social Gatherings with Friends and Family: Once a week    Attends Religious Services: 1 to 4 times per year    Active Member of [employer] or Organizations: No    Attends Engineer, structural: Not on file    Marital Status: Married  Catering manager Violence: Not At Risk (06/03/2022)   Humiliation, Afraid, Rape, and Kick questionnaire    Fear of Current or Ex-Partner: No    Emotionally Abused: No    Physically Abused: No    Sexually Abused: No     Constitutional: Patient reports intermittent headaches.  Denies fever, malaise, fatigue, or abrupt weight changes.  Respiratory: Denies difficulty breathing, shortness of breath, cough or sputum production.   Cardiovascular: Denies chest pain, chest tightness, palpitations or swelling in the hands or feet.   Gastrointestinal: Denies abdominal pain, bloating, constipation, diarrhea or blood in the stool.  GU: Pt reports urinary incontinence. Denies urgency, frequency, pain with urination, burning sensation, blood in urine, odor or discharge. Musculoskeletal: Patient reports chronic joint and muscle pain and weakness.  Denies decrease in range of motion, difficulty with gait, or joint swelling.  Neurological: Patient reports insomnia.  Denies dizziness, difficulty with memory, difficulty with speech or problems with balance and coordination.   No other specific complaints in a complete review of systems (except as listed in HPI above).  Objective:   Physical Exam BP 130/84 (BP Location: Right Arm, Patient Position: Sitting, Cuff Size: Large)   Ht 5' 5 (1.651 m)   Wt 244 lb 6.4 oz (110.9 kg)   BMI 40.67 kg/m     Wt Readings from Last 3 Encounters:  01/28/24 238 lb (108 kg)  01/26/24 237 lb 6.4 oz (107.7 kg)  01/05/24 237 lb 3.2 oz (107.6 kg)    General: Appears her stated age, obese, in NAD. Cardiovascular: Normal rate. rhythm. S1,S2 noted.   Pulmonary/Chest: Normal effort. No respiratory distress.  Musculoskeletal: Strength 5/5 BUE. Shoulder shrug equal. Hand grips equal. Gait slow and steady without device.  Neurological: Alert and oriented. Coordination normal.    BMET    Component Value Date/Time   NA 140 01/28/2024 1432   NA 142 09/18/2016 0929   K 4.0 01/28/2024 1432   CL 107 01/28/2024 1432   CO2 25 01/28/2024 1432   GLUCOSE 99 01/28/2024 1432   BUN 13 01/28/2024 1432   BUN 9 09/18/2016 0929   CREATININE 0.70 01/28/2024 1432   CALCIUM  9.6 01/28/2024 1432   GFRNONAA >60 01/15/2022 2334   GFRAA >60 12/01/2019 1558    Lipid Panel     Component Value Date/Time   CHOL 211 (H) 01/26/2024 1119   CHOL 200 (H) 03/23/2017 0844   TRIG 105 01/26/2024 1119   HDL  72 01/26/2024 1119   HDL 57 03/23/2017 0844   CHOLHDL 2.9 01/26/2024 1119   VLDL 36.2 03/19/2020 0918    LDLCALC 118 (H) 01/26/2024 1119    CBC    Component Value Date/Time   WBC 3.8 01/26/2024 1119   RBC 4.08 01/26/2024 1119   HGB 12.9 01/26/2024 1119   HGB 13.2 09/18/2016 0929   HCT 40.0 01/26/2024 1119   HCT 38.1 09/18/2016 0929   PLT 218 01/26/2024 1119   PLT 268 09/18/2016 0929   MCV 98.0 01/26/2024 1119   MCV 94 09/18/2016 0929   MCH 31.6 01/26/2024 1119   MCHC 32.3 01/26/2024 1119   RDW 13.0 01/26/2024 1119   RDW 12.8 09/18/2016 0929   LYMPHSABS 1,357 03/23/2023 1038   LYMPHSABS 2.8 08/03/2014 1609   MONOABS 0.6 08/30/2019 0315   EOSABS 72 01/26/2024 1119   EOSABS 0.1 08/03/2014 1609   BASOSABS 11 01/26/2024 1119   BASOSABS 0.0 08/03/2014 1609    Hgb A1C Lab Results  Component Value Date   HGBA1C 5.9 (H) 01/26/2024           Assessment & Plan:  Assessment and Plan      Fibromyalgia ,osteoarthritis Exacerbation possibly due to stress and medication changes. - Will decrease Lyrica  back down to 50 mg twice daily - Follow-up with pain management   Sjogren's syndrome Possible Imuran  dosage increase pending consultation. - Consult with pain management specialist before Imuran  dosage change. - Follow up with rheumatologist in January.        RTC in 4 months, follow-up chronic conditions Angeline Laura, NP

## 2024-03-28 NOTE — Addendum Note (Signed)
 Addended by: ZELIA GAUZE D on: 03/28/2024 11:40 AM   Modules accepted: Orders

## 2024-04-04 NOTE — Patient Instructions (Signed)

## 2024-04-04 NOTE — Progress Notes (Unsigned)
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 VIEW  Comparison: None.  Findings: Frontal, oblique, lateral, and ulnar deviation scaphoid images were obtained.  There is a linear radiopaque foreign body in the soft tissues between the first and second metacarpals measuring 5 mm in length. A second radiopaque foreign body is seen in the soft tissues dorsal to the space between the fourth and fifth metacarpals.  No other radiopaque foreign bodies seen.  No fracture or dislocation.  Joint spaces appear intact.  IMPRESSION: No fracture or dislocation.  Linear foreign body between the first and second metacarpals.  A second radiopaque foreign body measuring 3 mm is noted dorsal to the space between the fourth and fifth proximal metacarpals. No fracture or dislocation.  Original Report  Authenticated By: JOHNY ORN. WOODRUFF III, M.D.  Hand Imaging: Hand-R DG Complete: Results for orders placed during the hospital encounter of 11/08/18 DG Hand Complete Right  Narrative CLINICAL DATA:  Pt reports that she is switching pain clinic doctors and needed updated films. Pt states that pain in her back has been going on for a long time, but the pain in her ribs, shoulder, and hands is recent.  EXAM: RIGHT HAND - COMPLETE 3+ VIEW  COMPARISON:  None.  FINDINGS: There is no evidence of fracture or dislocation. There is no evidence of arthropathy or other focal bone abnormality. Soft tissues are unremarkable.  IMPRESSION: No acute osseous injury of the right hand.   Electronically Signed By: Julaine Blanch On: 11/08/2018 11:04  Hand-L DG Complete: Results for orders placed during the hospital encounter of 11/08/18 DG Hand Complete Left  Narrative CLINICAL DATA:  Pt reports that she is switching pain clinic doctors and needed updated films. Pt states that pain in her back has been going on for a long time, but the pain in her ribs, shoulder, and hands is recent.  EXAM: LEFT HAND - COMPLETE 3+ VIEW  COMPARISON:  None.  FINDINGS: There is no evidence of fracture or dislocation. There is minimal osteoarthritis of the first CMC joint. The remainder the joint spaces are maintained. There are no erosive changes. Soft tissues are unremarkable.  IMPRESSION: No acute osseous injury of the left hand.   Electronically Signed By: Julaine Blanch On: 11/08/2018 11:04  Complexity Note: Imaging results reviewed.                         ROS  Cardiovascular: {Hx; Cardiovascular History:210120525} Pulmonary or Respiratory: {Hx; Pumonary and/or Respiratory History:210120523} Neurological: {Hx; Neurological:210120504} Psychological-Psychiatric: {Hx; Psychological-Psychiatric History:210120512} Gastrointestinal: {Hx; Gastrointestinal:210120527} Genitourinary: {Hx;  Genitourinary:210120506} Hematological: {Hx; Hematological:210120510} Endocrine: {Hx; Endocrine history:210120509} Rheumatologic: {Hx; Rheumatological:210120530} Musculoskeletal: {Hx; Musculoskeletal:210120528} Work History: {Hx; Work history:210120514}  Allergies  [name] is allergic to hydrochlorothiazide , lipitor [atorvastatin ], orange juice [orange oil], and penicillins.  Laboratory Chemistry Profile   Renal Lab Results  Component Value Date   BUN 13 01/28/2024   CREATININE 0.70 01/28/2024   LABCREA 127 01/26/2024   BCR SEE NOTE: 01/28/2024   GFR 109.09 03/19/2020   GFRAA >60 12/01/2019   GFRNONAA >60 01/15/2022   PROTEINUR NEGATIVE 09/22/2023     Electrolytes Lab Results  Component Value Date   NA 140 01/28/2024   K 4.0 01/28/2024   CL 107 01/28/2024   CALCIUM  9.6 01/28/2024   MG 2.3 08/31/2019   PHOS 2.3 (L) 08/30/2019     Hepatic Lab Results  Component Value Date   AST 14 01/28/2024   ALT 11 01/28/2024   ALBUMIN 3.9 03/19/2020   ALKPHOS 62 03/19/2020   LIPASE 42 12/01/2019     ID Lab Results  Component Value Date   LYMEIGGIGMAB <0.91 01/09/2017   HIV NON-REACTIVE 04/17/2022   SARSCOV2NAA NEGATIVE 08/30/2019   STAPHAUREUS POSITIVE (A) 05/27/2022   MRSAPCR NEGATIVE 05/27/2022   PREGTESTUR NEGATIVE 08/20/2019   RMSFIGG Negative 01/09/2017     Bone Lab Results  Component Value Date   VD25OH 20.36 (L) 03/19/2020     Endocrine Lab Results  Component Value Date   GLUCOSE 99 01/28/2024   GLUCOSEU NEGATIVE 09/22/2023   HGBA1C 5.9 (H) 01/26/2024   TSH 1.99 07/30/2017     Neuropathy Lab Results  Component Value Date   VITAMINB12 214 11/08/2018   FOLATE 8.6 03/13/2016   HGBA1C 5.9 (H) 01/26/2024   HIV NON-REACTIVE 04/17/2022     CNS No results found for: COLORCSF, APPEARCSF, RBCCOUNTCSF, WBCCSF, POLYSCSF, LYMPHSCSF, EOSCSF, PROTEINCSF, GLUCCSF, JCVIRUS, CSFOLI, IGGCSF, LABACHR, ACETBL   Inflammation (CRP:  Acute  ESR: Chronic) Lab Results  Component Value Date   CRP 4.3 01/26/2024   ESRSEDRATE 11 01/26/2024     Rheumatology Lab Results  Component Value Date   RF <10 09/22/2023   ANA POSITIVE (A) 09/22/2023   LYMEIGGIGMAB <0.91 01/09/2017     Coagulation Lab Results  Component Value Date   INR 1.0 08/30/2019   LABPROT 87 01/26/2024   APTT 24 08/30/2019   PLT 218 01/26/2024   DDIMER <0.27 03/11/2016     Cardiovascular Lab Results  Component Value Date   CKTOTAL 65 07/31/2021   HGB 12.9 01/26/2024   HCT 40.0 01/26/2024     Screening Lab Results  Component Value Date   SARSCOV2NAA NEGATIVE 08/30/2019   STAPHAUREUS POSITIVE (A) 05/27/2022   MRSAPCR NEGATIVE 05/27/2022   HIV NON-REACTIVE 04/17/2022   PREGTESTUR NEGATIVE 08/20/2019     Cancer No results found for: CEA, CA125, LABCA2   Allergens No  results found for: ALMOND, APPLE, ASPARAGUS, AVOCADO, BANANA, BARLEY, BASIL, BAYLEAF, GREENBEAN, LIMABEAN, WHITEBEAN, BEEFIGE, REDBEET, BLUEBERRY, BROCCOLI, CABBAGE, MELON, CARROT, CASEIN, CASHEWNUT, CAULIFLOWER, CELERY     Note: Lab results reviewed.  PFSH  Drug: [name]  reports that she does not currently use drugs after having used the following drugs: Marijuana. Alcohol:  reports current alcohol use of about 5.0 standard drinks of alcohol per week. Tobacco:  reports that she has never smoked. She has been exposed to tobacco smoke. She has never used smokeless tobacco. Medical:  has a past medical history of Allergy, Anemia, Anxiety, Arthritis, DDD (degenerative disc disease), lumbar, Depression, Diabetes mellitus, type 2 (HCC), Fibromyalgia, GERD (gastroesophageal reflux disease), Morbid obesity (HCC), Pneumonia (01/2022), PONV (postoperative nausea and vomiting), Pre-diabetes, Shingles, and Sjogren's syndrome. Family: family history includes ADD / ADHD in her daughter; Arthritis in her sister and sister; Diabetes in her  maternal aunt; Fibroids in her sister, sister, and sister; Heart disease in her maternal aunt and maternal uncle; Hyperlipidemia in her mother; Obesity in her sister; Osteoarthritis in her sister and sister; Rheum arthritis in her nephew; Stroke in her maternal aunt and maternal uncle.  Past Surgical History:  Procedure Laterality Date   ABDOMINAL HYSTERECTOMY  2008   LAVH, endometriosis ablation, mccall culdoplasty   BREAST BIOPSY Left 06/2019   CHOLECYSTECTOMY N/A 08/31/2019   Procedure: LAPAROSCOPIC CHOLECYSTECTOMY;  Surgeon: Desiderio Schanz, MD;  Location: [facility];  Service: General;  Laterality: N/A;   COLONOSCOPY  2022   DIAGNOSTIC LAPAROSCOPY  2002   Hysteroscopy, diagnostic laparoscopy, extensive lysis of adhesions, endometrial ablation, RSO   RIGHT OOPHORECTOMY  2007   with right salpingectomy   TOTAL KNEE ARTHROPLASTY 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   Procedure: Partial KNEE ARTHROPLASTY;  Surgeon: Leora Lynwood SAUNDERS, MD;  Location: [facility];  Service: Orthopedics;  Laterality: Left;   TUBAL LIGATION     Active Ambulatory Problems    Diagnosis Date Noted   Anxiety and depression 08/03/2014   Gastroesophageal reflux disease without esophagitis 11/15/2018   Hyperlipidemia associated with type 2 diabetes mellitus (HCC) 12/31/2020   Frequent headaches 12/31/2020   Class 2 obesity due to excess calories with body mass index (BMI) of 38.0 to 38.9 in adult 12/31/2020   Insomnia 02/14/2021   DDD (degenerative disc disease), lumbar 10/14/2021   Fibromyalgia 10/24/2021   Sjogren's syndrome 10/24/2021   Primary osteoarthritis of both hands 10/31/2021   Type 2 diabetes mellitus with hyperglycemia, without long-term current use of insulin (HCC) 04/10/2023   Resolved Ambulatory Problems    Diagnosis Date Noted   DDD (degenerative disc disease), lumbar 08/03/2014   Seasonal allergies 09/18/2016   Arthralgia of multiple joints 06/02/2016   Morbid obesity with BMI of 40.0-44.9, adult (HCC) 06/02/2016    Chronic low back pain (Primary area of Pain) (Bilateral) (R>L) w/o sciatica 10/07/2018   Chronic intermittent chest pain (Left) 10/07/2018   History of rib fracture (Left) 10/07/2018   Chronic rib pain (Fourth Area of Pain) (Left) 10/07/2018   Chronic lower extremity pain (Secondary Area of Pain) (Bilateral) (R>L) 10/07/2018   Chronic hip pain (Tertiary Area of Pain) (Bilateral) (R>L) 10/07/2018   Chronic feet pain (Sixth Area of Pain) (Bilateral) (R>L) 10/07/2018   Chronic neck pain (Seventh Area of Pain) (Bilateral) (R>L) 10/07/2018   Chronic upper back pain (Eighth Area of Pain) (Bilateral) (R>L) 10/07/2018   DDD (degenerative disc disease), cervical 10/07/2018   Chronic shoulder pain (Left) 10/07/2018   Occipital headache (Bilateral) (R>L) 10/07/2018   Cervicogenic headache (  Bilateral) (R>L) 10/07/2018   Pharmacologic therapy 10/07/2018   Disorder of skeletal system 10/07/2018   Problems influencing health status 10/07/2018   Chronic pain syndrome 10/07/2018   Lumbar facet arthropathy (Bilateral) 10/07/2018   Lumbar facet syndrome (Bilateral) (R>L) 10/07/2018   Grade 1 Anterolisthesis of L4/L5 (1 mm) 10/07/2018   Chronic hand pain (Fifth Area of Pain) (Bilateral) (R>L) 10/07/2018   Chronic wrist pain (Bilateral) (R>L) 10/07/2018   Vitamin D  deficiency 11/15/2018   Class 3 severe obesity without serious comorbidity with body mass index (BMI) of 40.0 to 44.9 in adult (HCC) 11/15/2018   Breast lump on left side at 9 o'clock position 04/07/2019   Hot flash, menopausal 04/07/2019   Pain and swelling of upper extremity (Left) 06/23/2019   Neurogenic pain 06/23/2019   Cervical foraminal stenosis (C5-6) (Left) 06/23/2019   Cervical radiculopathy (C6) (Left) 06/23/2019   Cervical disc disorder at C5-6 level w/ radiculopathy (Left) 06/23/2019   Cervicalgia (Left) 06/23/2019   Radicular pain of shoulder (Left) 07/07/2019   Cholelithiasis 08/30/2019   Hypokalemia 08/30/2019   Hypocalcemia  08/30/2019   Hypomagnesemia 08/30/2019   Diarrhea 08/30/2019   Biliary colic    Hypoalbuminemia    Pre-op evaluation    Hypophosphatemia    Chronic cholecystitis    Prediabetes 12/31/2020   Caregiver stress 02/14/2021   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 In no apparent acute distress There were no vitals filed for this visit. BMI Assessment: Estimated body mass index is 40.67 kg/m as calculated from the following:   Height as of 03/28/24: 5' 5 (1.651 m).   Weight as of 03/28/24: 244 lb 6.4 oz (110.9 kg).  BMI interpretation table: BMI level Category Range association with higher incidence of chronic pain  <18 kg/m2 Underweight   18.5-24.9 kg/m2 Ideal body weight   25-29.9 kg/m2 Overweight Increased incidence by 20%  30-34.9 kg/m2 Obese (Class I) Increased incidence by 68%  35-39.9 kg/m2 Severe obesity (Class II) Increased incidence by 136%  >40 kg/m2 Extreme obesity (Class III) Increased incidence by 254%   Patient's current BMI Ideal Body weight  There is no height or weight on file to calculate BMI. Ideal body weight: 57 kg (125 lb 10.6 oz) Adjusted ideal body weight: 78.5 kg (173 lb 2.5 oz)   BMI Readings from Last 4 Encounters:  03/28/24 40.67 kg/m  01/28/24 39.61 kg/m  01/26/24 38.90 kg/m  01/05/24 38.87 kg/m   Wt Readings from Last 4 Encounters:  03/28/24 244 lb 6.4 oz (110.9 kg)  01/28/24 238 lb (108 kg)  01/26/24 237 lb 6.4 oz (107.7 kg)  01/05/24 237 lb 3.2 oz (107.6 kg)    Psych/Mental status: Alert, oriented x 3 (person, place, & time)        Eyes: PERLA Respiratory: No evidence of acute respiratory distress  Assessment  Primary Diagnosis & Pertinent Problem List: There were no encounter diagnoses.  Visit Diagnosis (New problems to examiner): No diagnosis found. Plan of Care (Initial workup plan)  Note: [name]  was reminded that as per protocol, today's visit has been an evaluation only. We have not taken over the patient's controlled substance management.  Problem-specific plan: Assessment and Plan            Lab Orders  No laboratory test(s) ordered today   Imaging Orders  No imaging studies ordered today   Referral Orders  No referral(s) requested today   Procedure Orders    No procedure(s) ordered today   Pharmacotherapy (current): Medications ordered:  No orders of the defined types were placed in this encounter.  Medications administered during this visit: [name] had no medications administered during this visit.   Analgesic Pharmacotherapy:  Opioid Analgesics: For patients currently taking or requesting to take opioid analgesics, in accordance with [place]  Medical Board Guidelines, we will assess their risks and indications for the use of these substances. After completing our evaluation, we may offer recommendations, but we no longer take patients for medication management. The prescribing physician will ultimately decide, based on his/her training and level of comfort whether to adopt any of the recommendations, including whether or not to prescribe such medicines.  Membrane stabilizer: To be determined at a later time  Muscle relaxant: To be determined at a later time  NSAID: To be determined at a later time  Other analgesic(s): To be determined at a later time   Interventional management options: [name] was informed that there is no guarantee that she would be a candidate for interventional therapies. The decision will be based on the results of diagnostic  studies, as well as [name]'s risk profile.  Procedure(s) under consideration:  Pending results of ordered studies     Interventional Therapies  Risk Factors  Considerations  Medical Comorbidities:     Planned  Pending:      Under consideration:   Pending   Completed: (Analgesic benefit)1  None at this time   Therapeutic  Palliative (PRN) options:   None established   Completed by other providers:   None reported  1(Analgesic benefit): Expressed in percentage (%). (Local anesthetic[LA] +/- sedation  L.A.Local Anesthetic  Steroid benefit  Ongoing benefit)   Provider-requested follow-up: No follow-ups on file.  Future Appointments  Date Time Provider Department Center  04/06/2024  8:00 AM Tanya Glisson, MD [facility] None  06/29/2024  1:10 PM Cheryl Waddell HERO, PA-C [facility] None  07/26/2024 10:40 AM Antonette, Angeline ORN, NP [facility] [address]   I discussed the assessment and treatment plan with the patient. The patient was provided an opportunity to ask questions and all were answered. The patient agreed with the plan and demonstrated an understanding of the instructions.  Patient advised to call back or seek an in-person evaluation if the symptoms or condition worsens.  Duration of encounter: *** minutes.  Total time on encounter, as per AMA guidelines included both the face-to-face and non-face-to-face time personally spent by the physician and/or other qualified health care professional(s) on the day of the encounter (includes time in activities that require the physician or other qualified health care professional and does not include time in activities normally performed by clinical staff). Physician's time may include the following activities when performed: Preparing to see the patient (e.g., pre-charting review of records, searching for previously ordered imaging, lab work, and nerve conduction tests) Review of prior analgesic pharmacotherapies. Reviewing  PMP Interpreting ordered tests (e.g., lab work, imaging, nerve conduction tests) Performing post-procedure evaluations, including interpretation of diagnostic procedures Obtaining and/or reviewing separately obtained  history Performing a medically appropriate examination and/or evaluation Counseling and educating the patient/family/caregiver Ordering medications, tests, or procedures Referring and communicating with other health care professionals (when not separately reported) Documenting clinical information in the electronic or other health record Independently interpreting results (not separately reported) and communicating results to the patient/ family/caregiver Care coordination (not separately reported)  Note by: Eric DELENA Como, MD (TTS and AI technology used. I apologize for any typographical errors that were not detected and corrected.) Date: 04/06/2024; Time: 6:17 PM

## 2024-04-06 ENCOUNTER — Ambulatory Visit
Admission: RE | Admit: 2024-04-06 | Discharge: 2024-04-06 | Disposition: A | Discharge status: Home / Self Care | Source: Ambulatory Visit | Attending: Pain Medicine | Admitting: Pain Medicine

## 2024-04-06 ENCOUNTER — Ambulatory Visit: Admitting: Pain Medicine

## 2024-04-06 ENCOUNTER — Ambulatory Visit
Admission: RE | Admit: 2024-04-06 | Discharge: 2024-04-06 | Disposition: A | Discharge status: Home / Self Care | Attending: Pain Medicine | Admitting: Pain Medicine

## 2024-04-06 ENCOUNTER — Encounter: Payer: Self-pay | Admitting: Pain Medicine

## 2024-04-06 VITALS — BP 135/90 | HR 85 | Temp 97.2°F | Resp 16 | Ht 65.0 in | Wt 243.0 lb

## 2024-04-06 DIAGNOSIS — G8929 Other chronic pain: Secondary | ICD-10-CM | POA: Insufficient documentation

## 2024-04-06 DIAGNOSIS — M79604 Pain in right leg: Secondary | ICD-10-CM | POA: Diagnosis present

## 2024-04-06 DIAGNOSIS — R519 Headache, unspecified: Secondary | ICD-10-CM | POA: Diagnosis not present

## 2024-04-06 DIAGNOSIS — M47816 Spondylosis without myelopathy or radiculopathy, lumbar region: Secondary | ICD-10-CM | POA: Diagnosis not present

## 2024-04-06 DIAGNOSIS — M549 Dorsalgia, unspecified: Secondary | ICD-10-CM | POA: Insufficient documentation

## 2024-04-06 DIAGNOSIS — M542 Cervicalgia: Secondary | ICD-10-CM | POA: Insufficient documentation

## 2024-04-06 DIAGNOSIS — M79672 Pain in left foot: Secondary | ICD-10-CM | POA: Diagnosis not present

## 2024-04-06 DIAGNOSIS — M48061 Spinal stenosis, lumbar region without neurogenic claudication: Secondary | ICD-10-CM | POA: Diagnosis not present

## 2024-04-06 DIAGNOSIS — M4310 Spondylolisthesis, site unspecified: Secondary | ICD-10-CM | POA: Diagnosis present

## 2024-04-06 DIAGNOSIS — M79671 Pain in right foot: Secondary | ICD-10-CM | POA: Insufficient documentation

## 2024-04-06 DIAGNOSIS — M25551 Pain in right hip: Secondary | ICD-10-CM | POA: Insufficient documentation

## 2024-04-06 DIAGNOSIS — M25552 Pain in left hip: Secondary | ICD-10-CM | POA: Insufficient documentation

## 2024-04-06 DIAGNOSIS — M79642 Pain in left hand: Secondary | ICD-10-CM | POA: Insufficient documentation

## 2024-04-06 DIAGNOSIS — G894 Chronic pain syndrome: Secondary | ICD-10-CM | POA: Insufficient documentation

## 2024-04-06 DIAGNOSIS — M4316 Spondylolisthesis, lumbar region: Secondary | ICD-10-CM

## 2024-04-06 DIAGNOSIS — M79641 Pain in right hand: Secondary | ICD-10-CM

## 2024-04-06 DIAGNOSIS — M79605 Pain in left leg: Secondary | ICD-10-CM | POA: Insufficient documentation

## 2024-04-06 DIAGNOSIS — Z789 Other specified health status: Secondary | ICD-10-CM | POA: Insufficient documentation

## 2024-04-06 DIAGNOSIS — M51362 Other intervertebral disc degeneration, lumbar region with discogenic back pain and lower extremity pain: Secondary | ICD-10-CM

## 2024-04-06 DIAGNOSIS — M419 Scoliosis, unspecified: Secondary | ICD-10-CM | POA: Diagnosis not present

## 2024-04-06 DIAGNOSIS — M51369 Other intervertebral disc degeneration, lumbar region without mention of lumbar back pain or lower extremity pain: Secondary | ICD-10-CM

## 2024-04-06 DIAGNOSIS — M545 Low back pain, unspecified: Secondary | ICD-10-CM | POA: Insufficient documentation

## 2024-04-06 DIAGNOSIS — Z79899 Other long term (current) drug therapy: Secondary | ICD-10-CM | POA: Insufficient documentation

## 2024-04-06 DIAGNOSIS — M47814 Spondylosis without myelopathy or radiculopathy, thoracic region: Secondary | ICD-10-CM | POA: Diagnosis not present

## 2024-04-06 DIAGNOSIS — M899 Disorder of bone, unspecified: Secondary | ICD-10-CM | POA: Insufficient documentation

## 2024-04-06 DIAGNOSIS — M4802 Spinal stenosis, cervical region: Secondary | ICD-10-CM | POA: Diagnosis not present

## 2024-04-06 DIAGNOSIS — M5021 Other cervical disc displacement,  high cervical region: Secondary | ICD-10-CM | POA: Diagnosis not present

## 2024-04-06 DIAGNOSIS — M47812 Spondylosis without myelopathy or radiculopathy, cervical region: Secondary | ICD-10-CM | POA: Diagnosis not present

## 2024-04-06 DIAGNOSIS — M546 Pain in thoracic spine: Secondary | ICD-10-CM | POA: Diagnosis not present

## 2024-04-06 NOTE — Progress Notes (Signed)
 Safety precautions to be maintained throughout the outpatient stay will include: orient to surroundings, keep bed in low position, maintain call bell within reach at all times, provide assistance with transfer out of bed and ambulation.

## 2024-04-07 ENCOUNTER — Other Ambulatory Visit: Payer: Self-pay

## 2024-04-07 ENCOUNTER — Telehealth: Payer: Self-pay

## 2024-04-07 NOTE — Telephone Encounter (Signed)
 Please call out valium for her MRI on Saturday. She states she will need it since its not an open MRI. Please call her and let her know that it is called out. She is very nervous.

## 2024-04-09 ENCOUNTER — Ambulatory Visit

## 2024-04-10 LAB — COMPLIANCE DRUG ANALYSIS, UR

## 2024-04-13 LAB — 25-HYDROXY VITAMIN D LCMS D2+D3
25-Hydroxy, Vitamin D-2: 5.7 ng/mL
25-Hydroxy, Vitamin D-3: 34 ng/mL
25-Hydroxy, Vitamin D: 40 ng/mL

## 2024-04-13 LAB — MAGNESIUM: Magnesium: 2.1 mg/dL (ref 1.6–2.3)

## 2024-04-13 LAB — VITAMIN B12: Vitamin B-12: 784 pg/mL (ref 232–1245)

## 2024-04-15 ENCOUNTER — Ambulatory Visit
Admission: RE | Admit: 2024-04-15 | Discharge: 2024-04-15 | Disposition: A | Discharge status: Home / Self Care | Source: Ambulatory Visit | Attending: Pain Medicine | Admitting: Pain Medicine

## 2024-04-15 DIAGNOSIS — R519 Headache, unspecified: Secondary | ICD-10-CM | POA: Diagnosis present

## 2024-04-15 DIAGNOSIS — M79642 Pain in left hand: Secondary | ICD-10-CM | POA: Insufficient documentation

## 2024-04-15 DIAGNOSIS — G8929 Other chronic pain: Secondary | ICD-10-CM | POA: Diagnosis present

## 2024-04-15 DIAGNOSIS — M4802 Spinal stenosis, cervical region: Secondary | ICD-10-CM | POA: Diagnosis not present

## 2024-04-15 DIAGNOSIS — M542 Cervicalgia: Secondary | ICD-10-CM | POA: Insufficient documentation

## 2024-04-15 DIAGNOSIS — M549 Dorsalgia, unspecified: Secondary | ICD-10-CM | POA: Diagnosis present

## 2024-04-15 DIAGNOSIS — M5011 Cervical disc disorder with radiculopathy,  high cervical region: Secondary | ICD-10-CM | POA: Diagnosis not present

## 2024-04-15 DIAGNOSIS — M4722 Other spondylosis with radiculopathy, cervical region: Secondary | ICD-10-CM | POA: Diagnosis not present

## 2024-04-15 DIAGNOSIS — M79641 Pain in right hand: Secondary | ICD-10-CM | POA: Insufficient documentation

## 2024-04-26 DIAGNOSIS — R937 Abnormal findings on diagnostic imaging of other parts of musculoskeletal system: Secondary | ICD-10-CM | POA: Insufficient documentation

## 2024-04-26 NOTE — Progress Notes (Unsigned)
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 at this time Membrane stabilizer: I will not be prescribing any at this time Muscle relaxant: I will not be prescribing any at this time NSAID: I will not be prescribing any at this time Other analgesic(s): I will not be prescribing any at this time      Interventional Therapies  Risk Factors  Considerations  Medical Comorbidities:     Planned  Pending:      Under consideration:   Pending   Completed: (Analgesic benefit)1  None at this time   Therapeutic  Palliative (PRN) options:   None established   Completed by other providers:   None reported  1(Analgesic benefit): Expressed in percentage (%). (Local anesthetic[LA] +/- sedation  L.A.Local Anesthetic  Steroid benefit  Ongoing benefit)      Provider-requested follow-up: No follow-ups on file. Recent Visits Date Type Provider Dept  04/06/24 Office Visit Tanya Glisson, MD [facility]  Showing recent visits within past 90 days and meeting all other requirements Future Appointments Date Type Provider Dept  04/27/24 Appointment Tanya Glisson, MD [facility]  Showing future appointments within next 90 days and meeting all other requirements   Primary Care Physician: Antonette Angeline ORN, NP  Duration of encounter: *** minutes.  Total time on encounter, as per AMA guidelines included both the face-to-face and non-face-to-face time personally spent by the physician and/or other qualified health care professional(s) on the day of the encounter (includes time in activities that  require the physician or other qualified health care professional and does not include time in activities normally performed by clinical staff). Physician's time may include the following activities when performed: Preparing to see the patient (e.g., pre-charting review of records, searching for previously ordered imaging, lab work, and nerve conduction tests) Review of prior analgesic pharmacotherapies. Reviewing PMP Interpreting ordered tests (e.g., lab work, imaging, nerve conduction tests) Performing post-procedure evaluations, including interpretation of diagnostic procedures Obtaining and/or reviewing separately obtained history Performing a medically appropriate examination and/or evaluation Counseling and educating the patient/family/caregiver Ordering medications, tests, or procedures Referring and communicating with other health care professionals (when not separately reported) Documenting clinical information in the electronic or other health record Independently interpreting results (not separately reported) and communicating results to the patient/ family/caregiver Care coordination (not separately reported)  Note by: Glisson DELENA Tanya, MD (TTS technology used. I apologize for any typographical errors that were not detected and corrected.) Date: 04/27/2024; Time: 8:55 PM

## 2024-04-27 ENCOUNTER — Ambulatory Visit: Attending: Pain Medicine | Admitting: Pain Medicine

## 2024-04-27 ENCOUNTER — Encounter: Payer: Self-pay | Admitting: Pain Medicine

## 2024-04-27 VITALS — BP 144/95 | HR 85 | Temp 97.6°F | Ht 65.0 in | Wt 242.0 lb

## 2024-04-27 DIAGNOSIS — G8929 Other chronic pain: Secondary | ICD-10-CM | POA: Insufficient documentation

## 2024-04-27 DIAGNOSIS — M545 Low back pain, unspecified: Secondary | ICD-10-CM | POA: Insufficient documentation

## 2024-04-27 DIAGNOSIS — M25551 Pain in right hip: Secondary | ICD-10-CM | POA: Diagnosis not present

## 2024-04-27 DIAGNOSIS — M4310 Spondylolisthesis, site unspecified: Secondary | ICD-10-CM | POA: Diagnosis not present

## 2024-04-27 DIAGNOSIS — M25552 Pain in left hip: Secondary | ICD-10-CM | POA: Diagnosis not present

## 2024-04-27 DIAGNOSIS — M79605 Pain in left leg: Secondary | ICD-10-CM | POA: Diagnosis not present

## 2024-04-27 DIAGNOSIS — M5459 Other low back pain: Secondary | ICD-10-CM | POA: Diagnosis not present

## 2024-04-27 DIAGNOSIS — M47817 Spondylosis without myelopathy or radiculopathy, lumbosacral region: Secondary | ICD-10-CM | POA: Insufficient documentation

## 2024-04-27 DIAGNOSIS — R937 Abnormal findings on diagnostic imaging of other parts of musculoskeletal system: Secondary | ICD-10-CM | POA: Diagnosis not present

## 2024-04-27 DIAGNOSIS — M79641 Pain in right hand: Secondary | ICD-10-CM

## 2024-04-27 DIAGNOSIS — M79604 Pain in right leg: Secondary | ICD-10-CM | POA: Insufficient documentation

## 2024-04-27 NOTE — Progress Notes (Signed)
 Safety precautions to be maintained throughout the outpatient stay will include: orient to surroundings, keep bed in low position, maintain call bell within reach at all times, provide assistance with transfer out of bed and ambulation.

## 2024-04-27 NOTE — Patient Instructions (Signed)
 ______________________________________________________________________    Procedure instructions  Stop blood-thinners  Do not eat or drink fluids (other than water ) for 6 hours before your procedure  No water  for 2 hours before your procedure  Take your blood pressure medicine with a sip of water   Arrive 30 minutes before your appointment  If sedation is planned, bring suitable driver. [place], [place], & public transportation are NOT APPROVED)  Carefully read the Preparing for your procedure detailed instructions  If you have questions call us  at [phone]  Procedure appointments are for procedures only.   NO medication refills or new problem evaluations will be done on procedure days.   Only the scheduled, pre-approved procedure and side will be done.   ______________________________________________________________________     ______________________________________________________________________    Preparing for your procedure  Appointments: If you think you may not be able to keep your appointment, call 24-48 hours in advance to cancel. We need time to make it available to others.  Procedure visits are for procedures only. During your procedure appointment there will be: NO Prescription Refills*. NO medication changes or discussions*. NO discussion of disability issues*. NO unrelated pain problem evaluations*. NO evaluations to order other pain procedures*. *These will be addressed at a separate and distinct evaluation encounter on the provider's evaluation schedule and not during procedure days.  Instructions: Food intake: Avoid eating anything solid for at least 8 hours prior to your procedure. Clear liquid intake: You may take clear liquids such as water  up to 2 hours prior to your procedure. (No carbonated drinks. No soda.) Transportation: Unless otherwise stated by your physician, bring a driver. (Driver cannot be a Market researcher, Pharmacist, community, or any other form of public  transportation.) Morning Medicines: Except for blood thinners, take all of your other morning medications with a sip of water . Make sure to take your heart and blood pressure medicines. If your blood pressure's lower number is above 100, the case will be rescheduled. Blood thinners: Make sure to stop your blood thinners as instructed.  If you take a blood thinner, but were not instructed to stop it, call our office [phone] and ask to talk to a nurse. Not stopping a blood thinner prior to certain procedures could lead to serious complications. Diabetics on insulin : Notify the staff so that you can be scheduled 1st case in the morning. If your diabetes requires high dose insulin , take only  of your normal insulin  dose the morning of the procedure and notify the staff that you have done so. Preventing infections: Shower with an antibacterial soap the morning of your procedure.  Build-up your immune system: Take 1000 mg of Vitamin C with every meal (3 times a day) the day prior to your procedure. Antibiotics: Inform the nursing staff if you are taking any antibiotics or if you have any conditions that may require antibiotics prior to procedures. (Example: recent joint implants)   Pregnancy: If you are pregnant make sure to notify the nursing staff. Not doing so may result in injury to the fetus, including death.  Sickness: If you have a cold, fever, or any active infections, call and cancel or reschedule your procedure. Receiving steroids while having an infection may result in complications. Arrival: You must be in the facility at least 30 minutes prior to your scheduled procedure. Tardiness: Your scheduled time is also the cutoff time. If you do not arrive at least 15 minutes prior to your procedure, you will be rescheduled.  Children: Do not bring any children with  you. Make arrangements to keep them home. Dress appropriately: There is always a possibility that your clothing may get soiled. Avoid  long dresses. Valuables: Do not bring any jewelry or valuables.  Reasons to call and reschedule or cancel your procedure: (Following these recommendations will minimize the risk of a serious complication.) Surgeries: Avoid having procedures within 2 weeks of any surgery. (Avoid for 2 weeks before or after any surgery). Flu Shots: Avoid having procedures within 2 weeks of a flu shots or . (Avoid for 2 weeks before or after immunizations). Barium: Avoid having a procedure within 7-10 days after having had a radiological study involving the use of radiological contrast. (Myelograms, Barium swallow or enema study). Heart attacks: Avoid any elective procedures or surgeries for the initial 6 months after a Myocardial Infarction (Heart Attack). Blood thinners: It is imperative that you stop these medications before procedures. Let us  know if you if you take any blood thinner.  Infection: Avoid procedures during or within two weeks of an infection (including chest colds or gastrointestinal problems). Symptoms associated with infections include: Localized redness, fever, chills, night sweats or profuse sweating, burning sensation when voiding, cough, congestion, stuffiness, runny nose, sore throat, diarrhea, nausea, vomiting, cold or Flu symptoms, recent or current infections. It is specially important if the infection is over the area that we intend to treat. Heart and lung problems: Symptoms that may suggest an active cardiopulmonary problem include: cough, chest pain, breathing difficulties or shortness of breath, dizziness, ankle swelling, uncontrolled high or unusually low blood pressure, and/or palpitations. If you are experiencing any of these symptoms, cancel your procedure and contact your primary care physician for an evaluation.  Remember:  Regular Business hours are:  Monday to Thursday 8:00 AM to 4:00 PM  Provider's Schedule: Eric Como, MD:  Procedure days: Tuesday and Thursday 7:30  AM to 4:00 PM  Wallie Sherry, MD:  Procedure days: Monday and Wednesday 7:30 AM to 4:00 PM Last  Updated: 05/26/2023 ______________________________________________________________________     ______________________________________________________________________    General Risks and Possible Complications  Patient Responsibilities: It is important that you read this as it is part of your informed consent. It is our duty to inform you of the risks and possible complications associated with treatments offered to you. It is your responsibility as a patient to read this and to ask questions about anything that is not clear or that you believe was not covered in this document.  Patient's Rights: You have the right to refuse treatment. You also have the right to change your mind, even after initially having agreed to have the treatment done. However, under this last option, if you wait until the last second to change your mind, you may be charged for the materials used up to that point.  Introduction: Medicine is not an Visual merchandiser. Everything in Medicine, including the lack of treatment(s), carries the potential for danger, harm, or loss (which is by definition: Risk). In Medicine, a complication is a secondary problem, condition, or disease that can aggravate an already existing one. All treatments carry the risk of possible complications. The fact that a side effects or complications occurs, does not imply that the treatment was conducted incorrectly. It must be clearly understood that these can happen even when everything is done following the highest safety standards.  No treatment: You can choose not to proceed with the proposed treatment alternative. The "PRO(s)" would include: avoiding the risk of complications associated with the therapy. The "CON(s)" would include:  not getting any of the treatment benefits. These benefits fall under one of three categories: diagnostic; therapeutic; and/or  palliative. Diagnostic benefits include: getting information which can ultimately lead to improvement of the disease or symptom(s). Therapeutic benefits are those associated with the successful treatment of the disease. Finally, palliative benefits are those related to the decrease of the primary symptoms, without necessarily curing the condition (example: decreasing the pain from a flare-up of a chronic condition, such as incurable terminal cancer).  General Risks and Complications: These are associated to most interventional treatments. They can occur alone, or in combination. They fall under one of the following six (6) categories: no benefit or worsening of symptoms; bleeding; infection; nerve damage; allergic reactions; and/or death. No benefits or worsening of symptoms: In Medicine there are no guarantees, only probabilities. No healthcare provider can ever guarantee that a medical treatment will work, they can only state the probability that it may. Furthermore, there is always the possibility that the condition may worsen, either directly, or indirectly, as a consequence of the treatment. Bleeding: This is more common if the patient is taking a blood thinner, either prescription or over the counter (example: Goody Powders, Fish oil, Aspirin, Garlic, etc.), or if suffering a condition associated with impaired coagulation (example: Hemophilia, cirrhosis of the liver, low platelet counts, etc.). However, even if you do not have one on these, it can still happen. If you have any of these conditions, or take one of these drugs, make sure to notify your treating physician. Infection: This is more common in patients with a compromised immune system, either due to disease (example: diabetes, cancer, human immunodeficiency virus [HIV], etc.), or due to medications or treatments (example: therapies used to treat cancer and rheumatological diseases). However, even if you do not have one on these, it can still  happen. If you have any of these conditions, or take one of these drugs, make sure to notify your treating physician. Nerve Damage: This is more common when the treatment is an invasive one, but it can also happen with the use of medications, such as those used in the treatment of cancer. The damage can occur to small secondary nerves, or to large primary ones, such as those in the spinal cord and brain. This damage may be temporary or permanent and it may lead to impairments that can range from temporary numbness to permanent paralysis and/or brain death. Allergic Reactions: Any time a substance or material comes in contact with our body, there is the possibility of an allergic reaction. These can range from a mild skin rash (contact dermatitis) to a severe systemic reaction (anaphylactic reaction), which can result in death. Death: In general, any medical intervention can result in death, most of the time due to an unforeseen complication. ______________________________________________________________________      ______________________________________________________________________    Steroid injections  Common steroids for injections Triamcinolone: Used by many sports medicine physicians for large joint and bursal injections, often combined with a local anesthetic like lidocaine . A study focusing on coccydynia (tailbone pain) found triamcinolone was more effective than betamethasone , suggesting it may also be preferable for other localized inflammation conditions. Methylprednisolone: A common alternative to triamcinolone that is also a strong anti-inflammatory. It is available in different formulations, with the acetate suspension being the long-acting option for intra-articular injections. Dexamethasone : This is a non-particulate steroid, meaning it has a lower risk of tissue damage compared to particulate steroids like triamcinolone and methylprednisolone. While less common for this specific  use,  it is an option for targeted injections.   Considerations for physicians Particulate vs. non-particulate steroids: Triamcinolone and methylprednisolone are particulate, meaning they can clump together. Dexamethasone  is non-particulate. Particulate steroids are often preferred for their longer-lasting effects but carry a theoretical higher risk for certain injections (though this is less of a concern in the costochondral joints). Combined injectate: Corticosteroids are typically mixed with a local anesthetic like lidocaine  to provide both immediate pain relief (from the anesthetic) and longer-term inflammation reduction (from the steroid). Imaging guidance: To ensure accurate placement of the needle and medication, physicians may use ultrasound or fluoroscopic guidance for the injection, especially in complex or refractory cases.   Patient guidance Before undergoing a steroid injection, discuss the options with your physician. They will determine the best steroid, dosage, and procedure for your specific case based on factors like: Severity of your condition History of response to other treatments Your overall health status Experience and preference of the physician  Last  Updated: 02/09/2024 ______________________________________________________________________

## 2024-04-28 ENCOUNTER — Encounter: Payer: Self-pay | Admitting: Internal Medicine

## 2024-04-28 ENCOUNTER — Telehealth: Admitting: Internal Medicine

## 2024-04-28 DIAGNOSIS — M797 Fibromyalgia: Secondary | ICD-10-CM

## 2024-04-28 DIAGNOSIS — G894 Chronic pain syndrome: Secondary | ICD-10-CM

## 2024-04-28 MED ORDER — CYCLOBENZAPRINE HCL 10 MG PO TABS: 10.0000 mg | ORAL_TABLET | Freq: Three times a day (TID) | ORAL | 0 refills | Status: AC | PRN

## 2024-04-28 MED ORDER — METHOCARBAMOL 500 MG PO TABS: 500.0000 mg | ORAL_TABLET | Freq: Three times a day (TID) | ORAL | 1 refills | Status: AC | PRN

## 2024-04-28 MED ORDER — MELOXICAM 15 MG PO TABS: 15.0000 mg | ORAL_TABLET | ORAL | 1 refills | Status: AC | PRN

## 2024-04-28 NOTE — Patient Instructions (Signed)
Myofascial Pain Syndrome and Fibromyalgia Myofascial pain syndrome and fibromyalgia are both pain disorders. You may feel this pain mainly in your muscles. Myofascial pain syndrome: Always has tender points in the muscles that will cause pain when pressed (trigger points). The pain may come and go. Usually affects your neck, upper back, and shoulder areas. The pain often moves into your arms and hands. Fibromyalgia: Has muscle pains and tenderness that come and go. Is often associated with tiredness (fatigue) and sleep problems. Has trigger points. Tends to be long-lasting (chronic), but is not life-threatening. Fibromyalgia and myofascial pain syndrome are not the same. However, they often occur together. If you have both conditions, each can make the other worse. Both are common and can cause enough pain and fatigue to make day-to-day activities difficult. Both can be hard to diagnose because their symptoms are common in many other conditions. What are the causes? The exact causes of these conditions are not known. What increases the risk? You are more likely to develop either of these conditions if: You have a family history of the condition. You are female. You have certain triggers, such as: Spine disorders. An injury (trauma) or other physical stressors. Being under a lot of stress. Medical conditions such as osteoarthritis, rheumatoid arthritis, or lupus. What are the signs or symptoms? Fibromyalgia The main symptom of fibromyalgia is widespread pain and tenderness in your muscles. Pain is sometimes described as stabbing, shooting, or burning. You may also have: Tingling or numbness. Sleep problems and fatigue. Problems with attention and concentration (fibro fog). Other symptoms may include: Bowel and bladder problems. Headaches. Vision problems. Sensitivity to odors and noises. Depression or mood changes. Painful menstrual periods (dysmenorrhea). Dry skin or eyes. These  symptoms can vary over time. Myofascial pain syndrome Symptoms of myofascial pain syndrome include: Tight, ropy bands of muscle. Uncomfortable sensations in muscle areas. These may include aching, cramping, burning, numbness, tingling, and weakness. Difficulty moving certain parts of the body freely (poor range of motion). How is this diagnosed? This condition may be diagnosed by your symptoms and medical history. You will also have a physical exam. In general: Fibromyalgia is diagnosed if you have pain, fatigue, and other symptoms for more than 3 months, and symptoms cannot be explained by another condition. Myofascial pain syndrome is diagnosed if you have trigger points in your muscles, and those trigger points are tender and cause pain elsewhere in your body (referred pain). How is this treated? Treatment for these conditions depends on the type that you have. For fibromyalgia, a healthy lifestyle is the most important treatment including aerobic and strength exercises. Different types of medicines are used to help treat pain and include: NSAIDs. Medicines for treating depression. Medicines that help control seizures. Medicines that relax the muscles. Treatment for myofascial pain syndrome includes: Pain medicines, such as NSAIDs. Cooling and stretching of muscles. Massage therapy with myofascial release technique. Trigger point injections. Treating these conditions often requires a team of health care providers. These may include: Your primary care provider. A physical therapist. Complementary health care providers, such as massage therapists or acupuncturists. A psychiatrist for cognitive behavioral therapy. Follow these instructions at home: Medicines Take over-the-counter and prescription medicines only as told by your health care provider. Ask your health care provider if the medicine prescribed to you: Requires you to avoid driving or using machinery. Can cause constipation.  You may need to take these actions to prevent or treat constipation: Drink enough fluid to keep your urine pale   yellow. Take over-the-counter or prescription medicines. Eat foods that are high in fiber, such as beans, whole grains, and fresh fruits and vegetables. Limit foods that are high in fat and processed sugars, such as fried or sweet foods. Lifestyle  Do exercises as told by your health care provider or physical therapist. Practice relaxation techniques to control your stress. You may want to try: Biofeedback. Visual imagery. Hypnosis. Muscle relaxation. Yoga. Meditation. Maintain a healthy lifestyle. This includes eating a healthy diet and getting enough sleep. Do not use any products that contain nicotine or tobacco. These products include cigarettes, chewing tobacco, and vaping devices, such as e-cigarettes. If you need help quitting, ask your health care provider. General instructions Talk to your health care provider about complementary treatments, such as acupuncture or massage. Do not do activities that stress or strain your muscles. This includes repetitive motions and heavy lifting. Keep all follow-up visits. This is important. Where to find support Consider joining a support group with others who are diagnosed with this condition. National Fibromyalgia Association: fmaware.org Where to find more information U.S. Pain Foundation: uspainfoundation.org Contact a health care provider if: You have new symptoms. Your symptoms get worse or your pain is severe. You have side effects from your medicines. You have trouble sleeping. Your condition is causing depression or anxiety. Get help right away if: You have thoughts of hurting yourself or others. Get help right away if you feel like you may hurt yourself or others, or have thoughts about taking your own life. Go to your nearest emergency room or: Call 911. Call the National Suicide Prevention Lifeline at 1-800-273-8255  or 988. This is open 24 hours a day. Text the Crisis Text Line at 741741. This information is not intended to replace advice given to you by your health care provider. Make sure you discuss any questions you have with your health care provider. Document Revised: 03/10/2022 Document Reviewed: 05/03/2021 Elsevier Patient Education  2024 Elsevier Inc.  

## 2024-04-28 NOTE — Progress Notes (Signed)
 Virtual Visit via Video Note  I connected with [name] on 04/28/24 at 11:40 AM EST by a video enabled telemedicine application and verified that I am speaking with the correct person using two identifiers.  Location: Patient: In her car Provider: Office  Person's participating in this video call: Alice Laura, NP-C and [name]   I discussed the limitations of evaluation and management by telemedicine and the availability of in person appointments. The patient expressed understanding and agreed to proceed.  History of Present Illness:  Discussed the use of AI scribe software for clinical note transcription with the patient, who gave verbal consent to proceed.  [name] is a 58 year old female with fibromyalgia and chronic back pain who presents with a flare-up of pain and medication refill request.  She is experiencing a flare-up of her chronic pain, which she attributes to stress. The pain involves her back, shoulders, head, legs, and thighs, described as a 'full flare-up' that began yesterday. Her back pain is persistent, characterized by soreness and stiffness, affecting her ability to stand for prolonged periods.  Despite undergoing an MRI and multiple x-rays, she feels she has not received adequate information about her condition. She is currently out of meloxicam , which she takes at a dose of 15 mg, along with Tylenol . She also uses methocarbamol  500 mg three times a day as needed and has tramadol  available but uses it sparingly. She previously discontinued pregabalin  due to muscle weakness and difficulty climbing stairs, which she tapered off gradually.  She has a history of receiving facet joint injections, which have provided relief in the past, particularly for sciatic pain. She recalls a successful injection in September 2024 that alleviated her sciatic symptoms, allowing her to walk without difficulty.  Family history includes a sister who has  undergone multiple back surgeries and now uses a cane, influencing her desire to avoid surgery.  She reports not sleeping well last night due to pain and stress, and she describes pressure points in her shoulders and head pain during the flare-up.          Past Medical History:  Diagnosis Date   Allergy    Anemia    h/o   Anxiety    Arthritis    DDD (degenerative disc disease), lumbar    Depression    Diabetes mellitus, type 2 (HCC)    Fibromyalgia    GERD (gastroesophageal reflux disease)    Morbid obesity (HCC)    Pneumonia 01/2022   PONV (postoperative nausea and vomiting)    Pre-diabetes    Shingles    Sjogren's syndrome     Current Outpatient Medications  Medication Sig Dispense Refill   acetaminophen  (TYLENOL  8 HOUR ARTHRITIS PAIN) 650 MG CR tablet Take 1,300 mg by mouth daily.     azaTHIOprine  (IMURAN ) 50 MG tablet Take 1 tablet (50 mg total) by mouth daily. 90 tablet 0   Blood Glucose Monitoring Suppl DEVI 1 each by Does not apply route in the morning, at noon, and at bedtime. May substitute to any manufacturer covered by patient's insurance. 1 each 3   Cinnamon-Chromium-Biotin ([facility] CINNAMON COMPLEX PO) Take by mouth.     cyclobenzaprine  (FLEXERIL ) 10 MG tablet TAKE 1 TABLET BY MOUTH DAILY AS NEEDED FOR MUSCLE SPASMS. 30 tablet 0   eszopiclone  (LUNESTA ) 2 MG TABS tablet Take 1 tablet (2 mg total) by mouth at bedtime as needed for sleep. Take immediately before bedtime 30 tablet 0   famotidine  (  PEPCID ) 20 MG tablet TAKE 1 TABLET (20 MG TOTAL) BY MOUTH 2 (TWO) TIMES DAILY AS NEEDED FOR HEARTBURN OR INDIGESTION. 180 tablet 0   fexofenadine -pseudoephedrine (ALLEGRA-D 24) 180-240 MG 24 hr tablet Take 1 tablet by mouth daily. 180 tablet 0   fluticasone  (FLONASE  ALLERGY RELIEF) 50 MCG/ACT nasal spray      glucose blood test strip Use 1-3 times daily as needed DX E11.65 100 each 12   hydrOXYzine  (VISTARIL ) 25 MG capsule TAKE 1 CAPSULE (25 MG TOTAL) BY MOUTH DAILY AS NEEDED. 90  capsule 1   ibuprofen  (ADVIL ) 800 MG tablet TAKE 1 TABLET (800 MG TOTAL) BY MOUTH DAILY AS NEEDED 30 tablet 0   meloxicam  (MOBIC ) 15 MG tablet Take 15 mg by mouth as needed for pain.     meloxicam  (MOBIC ) 7.5 MG tablet Take 7.5-15 mg by mouth daily.     methocarbamol  (ROBAXIN ) 500 MG tablet Take 1 tablet (500 mg total) by mouth 3 (three) times daily as needed. 90 tablet 1   Multiple Vitamin (MULTIVITAMIN PO) Take by mouth daily.     pantoprazole  (PROTONIX ) 40 MG tablet TAKE 1 TABLET BY MOUTH TWICE A DAY 180 tablet 1   rosuvastatin  (CRESTOR ) 10 MG tablet Take 1 tablet (10 mg total) by mouth 3 (three) times a week. 45 tablet 3   traMADol  (ULTRAM ) 50 MG tablet TAKE 1 TABLET BY MOUTH EVERY 6 HOURS AS NEEDED. 30 tablet 0   No current facility-administered medications for this visit.    Allergies  Allergen Reactions   Pregabalin  Other (See Comments)    Pt states severe muscle weakness   Hydrochlorothiazide  Other (See Comments)    The pt state she couldn't function and felt like she was dying    Lipitor [Atorvastatin ] Other (See Comments)    Muscle pain   Orange Juice [Orange Oil] Rash    rash   Penicillins Other (See Comments)    Childhood allergy Has patient had a PCN reaction causing immediate rash, facial/tongue/throat swelling, SOB or lightheadedness with hypotension: Unknown Has patient had a PCN reaction causing severe rash involving mucus membranes or skin necrosis: Unknown Has patient had a PCN reaction that required hospitalization: No  Has patient had a PCN reaction occurring within the last 10 years: No  If all of the above answers are NO, then may proceed with Cephalosporin use.     Family History  Problem Relation Age of Onset   Hyperlipidemia Mother    Fibroids Sister    Fibroids Sister    Fibroids Sister    Arthritis Sister    Osteoarthritis Sister    Osteoarthritis Sister    Arthritis Sister    Obesity Sister    ADD / ADHD Daughter    Heart disease Maternal  Aunt    Stroke Maternal Aunt    Diabetes Maternal Aunt    Heart disease Maternal Uncle    Stroke Maternal Uncle    Rheum arthritis Nephew    Cancer Neg Hx     Social History   Socioeconomic History   Marital status: Married    Spouse name: Not on file   Number of children: 2   Years of education: Not on file   Highest education level: Some college, no degree  Occupational History   Occupation: retail  Tobacco Use   Smoking status: Never    Passive exposure: Current   Smokeless tobacco: Never  Vaping Use   Vaping status: Never Used  Substance and Sexual Activity  Alcohol use: Yes    Alcohol/week: 5.0 standard drinks of alcohol    Types: 1 Glasses of wine, 4 Cans of beer per week    Comment: occassionally   Drug use: Not Currently    Types: Marijuana   Sexual activity: Yes    Partners: Male    Birth control/protection: Post-menopausal, Surgical  Other Topics Concern   Not on file  Social History Narrative   Not on file   Social Drivers of Health   Financial Resource Strain: Low Risk  (03/28/2024)   Overall Financial Resource Strain (CARDIA)    Difficulty of Paying Living Expenses: Not hard at all  Food Insecurity: Food Insecurity Present (03/28/2024)   Hunger Vital Sign    Worried About Running Out of Food in the Last Year: Sometimes true    Ran Out of Food in the Last Year: Sometimes true  Transportation Needs: No Transportation Needs (03/28/2024)   PRAPARE - Administrator, Civil Service (Medical): No    Lack of Transportation (Non-Medical): No  Physical Activity: Insufficiently Active (03/28/2024)   Exercise Vital Sign    Days of Exercise per Week: 3 days    Minutes of Exercise per Session: 30 min  Stress: Stress Concern Present (03/28/2024)   [employer] of Occupational Health - Occupational Stress Questionnaire    Feeling of Stress: To some extent  Social Connections: Moderately Integrated (03/28/2024)   Social Connection and Isolation  Panel    Frequency of Communication with Friends and Family: Three times a week    Frequency of Social Gatherings with Friends and Family: Once a week    Attends Religious Services: 1 to 4 times per year    Active Member of [employer] or Organizations: No    Attends Engineer, Structural: Not on file    Marital Status: Married  Catering Manager Violence: Not At Risk (06/03/2022)   Humiliation, Afraid, Rape, and Kick questionnaire    Fear of Current or Ex-Partner: No    Emotionally Abused: No    Physically Abused: No    Sexually Abused: No    Constitutional: Patient reports intermittent headaches.  Denies fever, malaise, fatigue, or abrupt weight changes.  HEENT: Denies eye pain, eye redness, ear pain, ringing in the ears, wax buildup, runny nose, nasal congestion, bloody nose, or sore throat. Respiratory: Denies difficulty breathing, shortness of breath, cough or sputum production.   Cardiovascular: Denies chest pain, chest tightness, palpitations or swelling in the hands or feet.  Gastrointestinal: Denies abdominal pain, bloating, constipation, diarrhea or blood in the stool.  GU: Denies urgency, frequency, pain with urination, burning sensation, blood in urine, odor or discharge. Musculoskeletal: Patient reports chronic joint and muscle pain.  Denies decrease in range of motion, difficulty with gait, or joint swelling.  Skin: Denies redness, rashes, lesions or ulcercations.  Neurological: Patient reports insomnia.  Denies dizziness, difficulty with memory, difficulty with speech or problems with balance and coordination.  Psych: Patient has a history of anxiety and depression.  Denies SI/HI.   No other specific complaints in a complete review of systems (except as listed in HPI above).   Observations/Objective:   Wt Readings from Last 3 Encounters:  04/27/24 242 lb (109.8 kg)  04/06/24 243 lb (110.2 kg)  03/28/24 244 lb 6.4 oz (110.9 kg)    General: Appears her stated  age,obese, in NAD. Pulmonary/Chest: Normal effort. No respiratory distress.  Neurological: Alert and oriented.  Psychiatric: Mood and affect normal. Behavior is normal. Judgment  and thought content normal.   BMET    Component Value Date/Time   NA 140 01/28/2024 1432   NA 142 09/18/2016 0929   K 4.0 01/28/2024 1432   CL 107 01/28/2024 1432   CO2 25 01/28/2024 1432   GLUCOSE 99 01/28/2024 1432   BUN 13 01/28/2024 1432   BUN 9 09/18/2016 0929   CREATININE 0.70 01/28/2024 1432   CALCIUM  9.6 01/28/2024 1432   GFRNONAA >60 01/15/2022 2334   GFRAA >60 12/01/2019 1558    Lipid Panel     Component Value Date/Time   CHOL 211 (H) 01/26/2024 1119   CHOL 200 (H) 03/23/2017 0844   TRIG 105 01/26/2024 1119   HDL 72 01/26/2024 1119   HDL 57 03/23/2017 0844   CHOLHDL 2.9 01/26/2024 1119   VLDL 36.2 03/19/2020 0918   LDLCALC 118 (H) 01/26/2024 1119    CBC    Component Value Date/Time   WBC 3.8 01/26/2024 1119   RBC 4.08 01/26/2024 1119   HGB 12.9 01/26/2024 1119   HGB 13.2 09/18/2016 0929   HCT 40.0 01/26/2024 1119   HCT 38.1 09/18/2016 0929   PLT 218 01/26/2024 1119   PLT 268 09/18/2016 0929   MCV 98.0 01/26/2024 1119   MCV 94 09/18/2016 0929   MCH 31.6 01/26/2024 1119   MCHC 32.3 01/26/2024 1119   RDW 13.0 01/26/2024 1119   RDW 12.8 09/18/2016 0929   LYMPHSABS 1,357 03/23/2023 1038   LYMPHSABS 2.8 08/03/2014 1609   MONOABS 0.6 08/30/2019 0315   EOSABS 72 01/26/2024 1119   EOSABS 0.1 08/03/2014 1609   BASOSABS 11 01/26/2024 1119   BASOSABS 0.0 08/03/2014 1609    Hgb A1C Lab Results  Component Value Date   HGBA1C 5.9 (H) 01/26/2024       Assessment and Plan:  Assessment and Plan    Fibromyalgia with chronic pain syndrome Chronic pain syndrome with fibromyalgia, exacerbated by stress, affecting multiple areas. Previous facet joint injections effective. Current medications include meloxicam , methocarbamol , Tylenol , and tramadol . Pregabalin  discontinued due to  side effects. Surgery not indicated. - Refilled meloxicam  15 mg. - Refilled methocarbamol  500 mg three times a day as needed. - Continue Tylenol  arthritis strength and tramadol  as needed. - Avoid back surgery unless necessary. - Consider facet joint injections if previously effective.        RTC in 3 months for follow-up chronic conditions  Follow Up Instructions:    I discussed the assessment and treatment plan with the patient. The patient was provided an opportunity to ask questions and all were answered. The patient agreed with the plan and demonstrated an understanding of the instructions.   The patient was advised to call back or seek an in-person evaluation if the symptoms worsen or if the condition fails to improve as anticipated.   Alice Laura, NP

## 2024-05-08 ENCOUNTER — Other Ambulatory Visit: Payer: Self-pay | Admitting: Physician Assistant

## 2024-05-08 DIAGNOSIS — Z79899 Other long term (current) drug therapy: Secondary | ICD-10-CM

## 2024-05-09 NOTE — Telephone Encounter (Signed)
 Last Fill: 01/28/2024  Labs: 01/28/2024 CMP WNL  01/26/2024 CBC normal  Next Visit: 06/29/2024  Last Visit: 01/28/2024  DX: Inflammatory arthritis   Current Dose per office note 01/28/2024: Imuran  50 mg 1 tablet by mouth daily   Contacted the patient and advised she is due to update lab work. Patient states she will come to the office to get lab work drawn. Will pend CBC and CMP.   Okay to refill Imuran ?

## 2024-05-24 ENCOUNTER — Other Ambulatory Visit: Payer: Self-pay | Admitting: *Deleted

## 2024-05-24 DIAGNOSIS — Z79899 Other long term (current) drug therapy: Secondary | ICD-10-CM

## 2024-05-24 LAB — COMPREHENSIVE METABOLIC PANEL WITH GFR
AG Ratio: 1.4 (calc) (ref 1.0–2.5)
ALT: 19 U/L (ref 6–29)
AST: 17 U/L (ref 10–35)
Albumin: 4.3 g/dL (ref 3.6–5.1)
Alkaline phosphatase (APISO): 63 U/L (ref 37–153)
BUN: 15 mg/dL (ref 7–25)
CO2: 29 mmol/L (ref 20–32)
Calcium: 9.6 mg/dL (ref 8.6–10.4)
Chloride: 106 mmol/L (ref 98–110)
Creat: 0.78 mg/dL (ref 0.50–1.03)
Globulin: 3.1 g/dL (ref 1.9–3.7)
Glucose, Bld: 112 mg/dL — ABNORMAL HIGH (ref 65–99)
Potassium: 3.8 mmol/L (ref 3.5–5.3)
Sodium: 140 mmol/L (ref 135–146)
Total Bilirubin: 0.4 mg/dL (ref 0.2–1.2)
Total Protein: 7.4 g/dL (ref 6.1–8.1)
eGFR: 88 mL/min/1.73m2 (ref >=60)

## 2024-05-24 LAB — CBC WITH DIFFERENTIAL/PLATELET
Absolute Lymphocytes: 1320 {cells}/uL (ref 850–3900)
Absolute Monocytes: 339 {cells}/uL (ref 200–950)
Basophils Absolute: 22 {cells}/uL (ref 0–200)
Basophils Relative: 0.5 %
Eosinophils Absolute: 70 {cells}/uL (ref 15–500)
Eosinophils Relative: 1.6 %
HCT: 38.8 % (ref 35.9–46.0)
Hemoglobin: 13 g/dL (ref 11.7–15.5)
MCH: 32.1 pg (ref 27.0–33.0)
MCHC: 33.5 g/dL (ref 31.6–35.4)
MCV: 95.8 fL (ref 81.4–101.7)
MPV: 11.5 fL (ref 7.5–12.5)
Monocytes Relative: 7.7 %
Neutro Abs: 2649 {cells}/uL (ref 1500–7800)
Neutrophils Relative %: 60.2 %
Platelets: 229 Thousand/uL (ref 140–400)
RBC: 4.05 Million/uL (ref 3.80–5.10)
RDW: 12.6 % (ref 11.0–15.0)
Total Lymphocyte: 30 %
WBC: 4.4 Thousand/uL (ref 3.8–10.8)

## 2024-05-25 ENCOUNTER — Ambulatory Visit: Payer: Self-pay | Admitting: Physician Assistant

## 2024-05-25 NOTE — Progress Notes (Signed)
 Glucose is 112, rest of CMP WNL.  CBC WNL

## 2024-06-17 NOTE — Progress Notes (Unsigned)
 "  Office Visit Note  Patient: [name]             Date of Birth: May 04, 1966           MRN: [id number]             PCP: Antonette Angeline ORN, NP Referring: Antonette Angeline ORN, NP Visit Date: 06/29/2024 Occupation: Data Unavailable  Subjective:  Pain in both hands   History of Present Illness: [name] is a 59 y.o. female with history of sjogren's syndrome and inflammatory arthritis. Patient is currently taking imuran  50 mg 1 tablet by mouth daily.  Patient states that she resumed taking Imuran  last night.  Patient states that she has had several interruptions in therapy due to infection or being around a sick contact such as her granddaughter.  Patient states that during the gaps in therapy.   She experiences increased pain and stiffness affecting both hands.  She has also been experiencing increased nocturnal pain.  She has noticed increased myofascial pain especially in the trapezius muscles bilaterally.  Patient states that she picked up her prescription for tramadol  today which she plans on taking for pain relief.  She has also picked up a prescription for Lunesta  to help with insomnia.  She takes meloxicam  15 mg 1 tablet most days for pain relief as well.  Patient continues to have chronic sicca symptoms.   Activities of Daily Living:  Patient reports morning stiffness for 35-45 minutes.   Patient Reports nocturnal pain.  Difficulty dressing/grooming: Reports Difficulty climbing stairs: Reports Difficulty getting out of chair: Reports Difficulty using hands for taps, buttons, cutlery, and/or writing: Reports  Review of Systems  Constitutional:  Positive for fatigue.  HENT:  Positive for mouth sores and mouth dryness.   Eyes:  Positive for dryness.  Respiratory:  Negative for shortness of breath.   Cardiovascular:  Positive for palpitations. Negative for chest pain.  Gastrointestinal:  Negative for blood in stool, constipation and diarrhea.  Endocrine: Positive for  increased urination.  Genitourinary:  Negative for involuntary urination.  Musculoskeletal:  Positive for joint pain, gait problem, joint pain, joint swelling, myalgias, muscle weakness, morning stiffness, muscle tenderness and myalgias.  Skin:  Positive for sensitivity to sunlight. Negative for color change, rash and hair loss.  Allergic/Immunologic: Negative for susceptible to infections.  Neurological:  Positive for headaches. Negative for dizziness.  Hematological:  Negative for swollen glands.  Psychiatric/Behavioral:  Positive for sleep disturbance. Negative for depressed mood. The patient is not nervous/anxious.     PMFS History:  Patient Active Problem List   Diagnosis Date Noted   Multifactorial low back pain 04/27/2024   Type 2 diabetes mellitus with hyperglycemia, without long-term current use of insulin (HCC) 04/10/2023   Fibromyalgia 10/24/2021   Sjogren's syndrome 10/24/2021   Insomnia 02/14/2021   Hyperlipidemia associated with type 2 diabetes mellitus (HCC) 12/31/2020   Frequent headaches 12/31/2020   Class 2 obesity due to excess calories with body mass index (BMI) of 38.0 to 38.9 in adult 12/31/2020   Gastroesophageal reflux disease without esophagitis 11/15/2018   Chronic low back pain (1ry area of Pain) (Bilateral) (R>L) w/o sciatica 10/07/2018   Chronic lower extremity pain (2ry area of Pain) (Bilateral) (R>L) 10/07/2018   Chronic hip pain (3ry area of Pain) (Bilateral) (R>L) 10/07/2018   Chronic feet pain (6th area of Pain) (Bilateral) (R>L) 10/07/2018   Chronic neck pain (7th area of Pain) (Bilateral) (R>L) 10/07/2018   Chronic upper back pain (8th  area of Pain) (Bilateral) (R>L) 10/07/2018   Occipital headache (Bilateral) (R>L) 10/07/2018   Chronic pain syndrome 10/07/2018   Chronic hand pain (5th area of Pain) (Bilateral) (R>L) 10/07/2018   Anxiety and depression 08/03/2014    Past Medical History:  Diagnosis Date   Allergy    Anemia    h/o   Anxiety     Arthritis    DDD (degenerative disc disease), lumbar    Depression    Diabetes mellitus, type 2 (HCC)    Fibromyalgia    GERD (gastroesophageal reflux disease)    Morbid obesity (HCC)    Pneumonia 01/2022   PONV (postoperative nausea and vomiting)    Pre-diabetes    Shingles    Sjogren's syndrome     Family History  Problem Relation Age of Onset   Hyperlipidemia Mother    Fibroids Sister    Fibroids Sister    Fibroids Sister    Arthritis Sister    Osteoarthritis Sister    Osteoarthritis Sister    Arthritis Sister    Obesity Sister    ADD / ADHD Daughter    Heart disease Maternal Aunt    Stroke Maternal Aunt    Diabetes Maternal Aunt    Heart disease Maternal Uncle    Stroke Maternal Uncle    Rheum arthritis Nephew    Cancer Neg Hx    Past Surgical History:  Procedure Laterality Date   ABDOMINAL HYSTERECTOMY  2008   LAVH, endometriosis ablation, mccall culdoplasty   BREAST BIOPSY Left 06/2019   CHOLECYSTECTOMY N/A 08/31/2019   Procedure: LAPAROSCOPIC CHOLECYSTECTOMY;  Surgeon: Desiderio Schanz, MD;  Location: [facility];  Service: General;  Laterality: N/A;   COLONOSCOPY  2022   DIAGNOSTIC LAPAROSCOPY  2002   Hysteroscopy, diagnostic laparoscopy, extensive lysis of adhesions, endometrial ablation, RSO   RIGHT OOPHORECTOMY  2007   with right salpingectomy   TOTAL KNEE ARTHROPLASTY 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   Procedure: Partial KNEE ARTHROPLASTY;  Surgeon: Leora Lynwood SAUNDERS, MD;  Location: [facility];  Service: Orthopedics;  Laterality: Left;   TUBAL LIGATION     Social History[1] Social History   Social History Narrative   Not on file     Immunization History  Administered Date(s) Administered   Influenza, Seasonal, Injecte, Preservative Fre 04/02/2023, 03/28/2024   Influenza,inj,Quad PF,6+ Mos 03/17/2015, 03/17/2017, 02/25/2019, 03/19/2020, 03/28/2021   PFIZER(Purple Top)SARS-COV-2 Vaccination 07/11/2019, 08/01/2019   Pneumococcal Polysaccharide-23 09/22/2023   Tdap  02/09/2007, 09/18/2016   Zoster Recombinant(Shingrix) 03/20/2024, 06/24/2024     Objective: Vital Signs: BP 136/89   Pulse 97   Temp 98.1 F (36.7 C)   Resp 14   Ht 5' 5 (1.651 m)   Wt 240 lb 9.6 oz (109.1 kg)   BMI 40.04 kg/m    Physical Exam Vitals and nursing note reviewed.  Constitutional:      Appearance: She is well-developed.  HENT:     Head: Normocephalic and atraumatic.  Eyes:     Conjunctiva/sclera: Conjunctivae normal.  Cardiovascular:     Rate and Rhythm: Normal rate and regular rhythm.     Heart sounds: Normal heart sounds.  Pulmonary:     Effort: Pulmonary effort is normal.     Breath sounds: Normal breath sounds.  Abdominal:     General: Bowel sounds are normal.     Palpations: Abdomen is soft.  Musculoskeletal:     Cervical back: Normal range of motion.  Lymphadenopathy:     Cervical: No cervical adenopathy.  Skin:  General: Skin is warm and dry.     Capillary Refill: Capillary refill takes less than 2 seconds.  Neurological:     Mental Status: She is alert and oriented to person, place, and time.  Psychiatric:        Behavior: Behavior normal.      Musculoskeletal Exam: Generalized hyperalgesia and positive tender points on exam.  C-spine has limited range of motion bilateral rotation.  Thoracic kyphosis noted.  Painful limited mobility of the lumbar spine.   Trapezius muscle tension and tenderness bilaterally.  Shoulder joints, elbow joints, wrist joints, MCPs, PIPs, DIPs have good range of motion with no synovitis.  PIP and DIP thickening consistent with osteoarthritis of both hands.  Complete fist formation bilaterally.  Hip joints have good range of motion with no groin pain.  Left knee replacement has good range of motion.  Right knee joint has some discomfort with range of motion.  No tenderness or swelling of the ankle joints.  CDAI Exam: CDAI Score: -- Patient Global: --; Provider Global: -- Swollen: --; Tender: -- Joint Exam 06/29/2024    No joint exam has been documented for this visit   There is currently no information documented on the homunculus. Go to the Rheumatology activity and complete the homunculus joint exam.  Investigation: No additional findings.  Imaging: No results found.  Recent Labs: Lab Results  Component Value Date   WBC 4.4 05/24/2024   HGB 13.0 05/24/2024   PLT 229 05/24/2024   NA 140 05/24/2024   K 3.8 05/24/2024   CL 106 05/24/2024   CO2 29 05/24/2024   GLUCOSE 112 (H) 05/24/2024   BUN 15 05/24/2024   CREATININE 0.78 05/24/2024   BILITOT 0.4 05/24/2024   ALKPHOS 62 03/19/2020   AST 17 05/24/2024   ALT 19 05/24/2024   PROT 7.4 05/24/2024   ALBUMIN 3.9 03/19/2020   CALCIUM  9.6 05/24/2024   GFRAA >60 12/01/2019   QFTBGOLDPLUS NEGATIVE 04/17/2022    Speciality Comments: No specialty comments available.  Procedures:  No procedures performed Allergies: Pregabalin , Hydrochlorothiazide , Lipitor [atorvastatin ], Orange juice [orange oil], and Penicillins    Assessment / Plan:     Visit Diagnoses: Sjogren's syndrome with keratoconjunctivitis sicca - Positive ANA, positive SSA, sicca symptoms, arthralgias: Chronic sicca symptoms--using over-the-counter products for symptomatic relief.  Also using a humidifier at night.  Patient continues to experience chronic pain affecting multiple joints, most severe affecting both hands.  She has been prescribed Imuran  50 mg 1 tablet by mouth daily and has had to have several gaps in therapy she has been experiencing an exacerbation of pain in both hands but has no active synovitis on examination today.  Plan to check sed rate and CRP.  Plan to check the following lab work today for further evaluation.  Patient will remain on Imuran  50 mg 1 tablet by mouth daily with close monitoring.  Discussed the importance of monitoring for progression of symptoms.  She has not had any new or worsening pulmonary symptoms. Lab work from 01/26/24 was reviewed today in  the office: complements WNL, ESR WNL, CRP WNL, urine protein creatinine WNL.  Plan to update the following lab work today. She will follow up in 5 months or sooner if needed.  - Plan: Serum protein electrophoresis with reflex, Rheumatoid factor, C3 and C4, Sedimentation rate, Urinalysis, Routine w reflex microscopic, C-reactive protein, Sjogrens syndrome-A extractable nuclear antibody, Sjogrens syndrome-B extractable nuclear antibody, ANA  Inflammatory arthritis - She presents today with increased pain and  stiffness affecting both hands.  She has had several interruptions in therapy due to infections as well as being around a sick contact.  She is prescribed Imuran  50 mg 1 tablet by mouth daily but she resumed taking last night.  She had no synovitis on examination today.  She plans on trying to remain on Imuran  consistently. Plan to check ESR and CRP today. She will notify us  if she develops any new or worsening symptoms.  Plan: Sedimentation rate, C-reactive protein  High risk medication use - Imuran  50 mg 1 tablet by mouth daily.  Concern for early ILD and concern for hepatorenal toxicity--not be a good candidate for methotrexate . CBC and CMP updated on  05/24/24. Her next lab work will be due in March and every 3 months.  Discussed the importance of holding imuran  if she develops signs or symptoms of an infection and to resume once the infection has completely cleared.   Primary osteoarthritis of both hands: Patient presents today with increased pain and stiffness affecting both hands.  She has tenderness across the PIP joints especially the right 2nd through 4th PIP joints.  No synovitis was noted.  Plan to check sed rate and CRP today.  Paresthesia of both hands: Not currently symptomatic.  Trochanteric bursitis of both hips: Patient has tenderness over the trochanteric bursa bilaterally.  No groin pain currently.  Encourage patient to perform stretching exercises daily.  Status post left knee  replacement - Partial-06/03/22 Performed by Dr. Jesse well.  Good range of motion with no discomfort currently.  Primary osteoarthritis of right knee: No effusion noted.  Degeneration of intervertebral disc of lumbar region with discogenic back pain: Chronic pain.  She is considering proceeding with a back injection but will be following back up with the previous pain specialist.  Fibromyalgia - She is taking Lyrica  50 mg 1 capsule twice daily.  She is also taking meloxicam  7.5 mg to 15 mg daily as needed for pain relief and tramadol  50 mg 1 tablet every 6 hours.  She has generalized hyperalgesia and positive tender points on exam.  Other medical conditions are listed as follows:  Anxiety and depression  Adjustment insomnia  Frequent headaches  Pure hypertriglyceridemia  Hypermobility of joint  Gastroesophageal reflux disease without esophagitis  Seasonal allergies  Family history of rheumatoid arthritis- two sisters and nephew  History of diabetes mellitus  Orders: Orders Placed This Encounter  Procedures   Serum protein electrophoresis with reflex   Rheumatoid factor   C3 and C4   Sedimentation rate   Urinalysis, Routine w reflex microscopic   C-reactive protein   Sjogrens syndrome-A extractable nuclear antibody   Sjogrens syndrome-B extractable nuclear antibody   ANA   No orders of the defined types were placed in this encounter.   Follow-Up Instructions: Return in about 5 months (around 11/27/2024) for Sjogren's syndrome.   Waddell CHRISTELLA Craze, PA-C  Note - This record has been created using Dragon software.  Chart creation errors have been sought, but may not always  have been located. Such creation errors do not reflect on  the standard of medical care.     [1]  Social History Tobacco Use   Smoking status: Never    Passive exposure: Current   Smokeless tobacco: Never  Vaping Use   Vaping status: Never Used  Substance Use Topics   Alcohol use: Yes     Alcohol/week: 5.0 standard drinks of alcohol    Types: 1 Glasses of wine, 4 Cans of  beer per week    Comment: occassionally   Drug use: Not Currently    Types: Marijuana   "

## 2024-06-20 ENCOUNTER — Encounter: Payer: Self-pay | Admitting: Internal Medicine

## 2024-06-21 ENCOUNTER — Encounter: Payer: Self-pay | Admitting: Internal Medicine

## 2024-06-21 ENCOUNTER — Telehealth: Admitting: Internal Medicine

## 2024-06-21 DIAGNOSIS — B001 Herpesviral vesicular dermatitis: Secondary | ICD-10-CM | POA: Diagnosis not present

## 2024-06-21 MED ORDER — VALACYCLOVIR HCL 1 G PO TABS: 1000.0000 mg | ORAL_TABLET | Freq: Two times a day (BID) | ORAL | 0 refills | Status: AC

## 2024-06-21 NOTE — Progress Notes (Signed)
 Virtual Visit via Video Note  I connected with [name] on 06/21/2024 at  4:00 PM EST by a video enabled telemedicine application and verified that I am speaking with the correct person using two identifiers.  Location: Patient: In her car Provider: Office  Person's participating in this video call: Angeline Laura, NP-C and Ashlynd Michna   I discussed the limitations of evaluation and management by telemedicine and the availability of in person appointments. The patient expressed understanding and agreed to proceed.  History of Present Illness:      Discussed the use of AI scribe software for clinical note transcription with the patient, who gave verbal consent to proceed.  [name] is a 59 year old female who presents with a cold sore.  She developed a cold sore at the corner of her mouth, which began with a tingling sensation on Sunday afternoon, three days prior to the visit. She started using Abreva immediately after noticing the symptoms. The sore has decreased in size since starting the treatment. Initially, she felt a lump internally, but now only a small sore is visible.  She has a history of shingles and received the first dose of the Shingrix vaccine on October 5th, 2025, after waiting the recommended 90 days post-infection. She is planning to schedule the second dose soon.        Past Medical History:  Diagnosis Date   Allergy    Anemia    h/o   Anxiety    Arthritis    DDD (degenerative disc disease), lumbar    Depression    Diabetes mellitus, type 2 (HCC)    Fibromyalgia    GERD (gastroesophageal reflux disease)    Morbid obesity (HCC)    Pneumonia 01/2022   PONV (postoperative nausea and vomiting)    Pre-diabetes    Shingles    Sjogren's syndrome     Current Outpatient Medications  Medication Sig Dispense Refill   acetaminophen  (TYLENOL  8 HOUR ARTHRITIS PAIN) 650 MG CR tablet Take 1,300 mg by mouth daily.     azaTHIOprine   (IMURAN ) 50 MG tablet TAKE 1 TABLET BY MOUTH EVERY DAY 30 tablet 0   Blood Glucose Monitoring Suppl DEVI 1 each by Does not apply route in the morning, at noon, and at bedtime. May substitute to any manufacturer covered by patient's insurance. 1 each 3   Cinnamon-Chromium-Biotin ([facility] CINNAMON COMPLEX PO) Take by mouth.     cyclobenzaprine  (FLEXERIL ) 10 MG tablet Take 1 tablet (10 mg total) by mouth 3 (three) times daily as needed for muscle spasms. 90 tablet 0   eszopiclone  (LUNESTA ) 2 MG TABS tablet Take 1 tablet (2 mg total) by mouth at bedtime as needed for sleep. Take immediately before bedtime 30 tablet 0   famotidine  (PEPCID ) 20 MG tablet TAKE 1 TABLET (20 MG TOTAL) BY MOUTH 2 (TWO) TIMES DAILY AS NEEDED FOR HEARTBURN OR INDIGESTION. 180 tablet 0   fexofenadine -pseudoephedrine (ALLEGRA-D 24) 180-240 MG 24 hr tablet Take 1 tablet by mouth daily. 180 tablet 0   fluticasone  (FLONASE  ALLERGY RELIEF) 50 MCG/ACT nasal spray      glucose blood test strip Use 1-3 times daily as needed DX E11.65 100 each 12   hydrOXYzine  (VISTARIL ) 25 MG capsule TAKE 1 CAPSULE (25 MG TOTAL) BY MOUTH DAILY AS NEEDED. 90 capsule 1   ibuprofen  (ADVIL ) 800 MG tablet TAKE 1 TABLET (800 MG TOTAL) BY MOUTH DAILY AS NEEDED 30 tablet 0   meloxicam  (MOBIC ) 15 MG tablet  Take 1 tablet (15 mg total) by mouth as needed for pain. 90 tablet 1   methocarbamol  (ROBAXIN ) 500 MG tablet Take 1 tablet (500 mg total) by mouth 3 (three) times daily as needed. 90 tablet 1   Multiple Vitamin (MULTIVITAMIN PO) Take by mouth daily.     pantoprazole  (PROTONIX ) 40 MG tablet TAKE 1 TABLET BY MOUTH TWICE A DAY 180 tablet 1   rosuvastatin  (CRESTOR ) 10 MG tablet Take 1 tablet (10 mg total) by mouth 3 (three) times a week. 45 tablet 3   traMADol  (ULTRAM ) 50 MG tablet TAKE 1 TABLET BY MOUTH EVERY 6 HOURS AS NEEDED. 30 tablet 0   No current facility-administered medications for this visit.    Allergies  Allergen Reactions   Pregabalin  Other (See  Comments)    Pt states severe muscle weakness   Hydrochlorothiazide  Other (See Comments)    The pt state she couldn't function and felt like she was dying    Lipitor [Atorvastatin ] Other (See Comments)    Muscle pain   Orange Juice [Orange Oil] Rash    rash   Penicillins Other (See Comments)    Childhood allergy Has patient had a PCN reaction causing immediate rash, facial/tongue/throat swelling, SOB or lightheadedness with hypotension: Unknown Has patient had a PCN reaction causing severe rash involving mucus membranes or skin necrosis: Unknown Has patient had a PCN reaction that required hospitalization: No  Has patient had a PCN reaction occurring within the last 10 years: No  If all of the above answers are NO, then may proceed with Cephalosporin use.     Family History  Problem Relation Age of Onset   Hyperlipidemia Mother    Fibroids Sister    Fibroids Sister    Fibroids Sister    Arthritis Sister    Osteoarthritis Sister    Osteoarthritis Sister    Arthritis Sister    Obesity Sister    ADD / ADHD Daughter    Heart disease Maternal Aunt    Stroke Maternal Aunt    Diabetes Maternal Aunt    Heart disease Maternal Uncle    Stroke Maternal Uncle    Rheum arthritis Nephew    Cancer Neg Hx     Social History   Socioeconomic History   Marital status: Married    Spouse name: Not on file   Number of children: 2   Years of education: Not on file   Highest education level: Some college, no degree  Occupational History   Occupation: retail  Tobacco Use   Smoking status: Never    Passive exposure: Current   Smokeless tobacco: Never  Vaping Use   Vaping status: Never Used  Substance and Sexual Activity   Alcohol use: Yes    Alcohol/week: 5.0 standard drinks of alcohol    Types: 1 Glasses of wine, 4 Cans of beer per week    Comment: occassionally   Drug use: Not Currently    Types: Marijuana   Sexual activity: Yes    Partners: Male    Birth control/protection:  Post-menopausal, Surgical  Other Topics Concern   Not on file  Social History Narrative   Not on file   Social Drivers of Health   Tobacco Use: Medium Risk (04/28/2024)   Patient History    Smoking Tobacco Use: Never    Smokeless Tobacco Use: Never    Passive Exposure: Current  Financial Resource Strain: Low Risk (03/28/2024)   Overall Financial Resource Strain (CARDIA)    Difficulty of  Paying Living Expenses: Not hard at all  Food Insecurity: Food Insecurity Present (03/28/2024)   Epic    Worried About Programme Researcher, Broadcasting/film/video in the Last Year: Sometimes true    Ran Out of Food in the Last Year: Sometimes true  Transportation Needs: No Transportation Needs (03/28/2024)   Epic    Lack of Transportation (Medical): No    Lack of Transportation (Non-Medical): No  Physical Activity: Insufficiently Active (03/28/2024)   Exercise Vital Sign    Days of Exercise per Week: 3 days    Minutes of Exercise per Session: 30 min  Stress: Stress Concern Present (03/28/2024)   [employer] of Occupational Health - Occupational Stress Questionnaire    Feeling of Stress: To some extent  Social Connections: Moderately Integrated (03/28/2024)   Social Connection and Isolation Panel    Frequency of Communication with Friends and Family: Three times a week    Frequency of Social Gatherings with Friends and Family: Once a week    Attends Religious Services: 1 to 4 times per year    Active Member of [employer] or Organizations: No    Attends Engineer, Structural: Not on file    Marital Status: Married  Catering Manager Violence: Not At Risk (06/03/2022)   Humiliation, Afraid, Rape, and Kick questionnaire    Fear of Current or Ex-Partner: No    Emotionally Abused: No    Physically Abused: No    Sexually Abused: No  Depression (PHQ2-9): Low Risk (04/06/2024)   Depression (PHQ2-9)    PHQ-2 Score: 1  Recent Concern: Depression (PHQ2-9) - Medium Risk (03/28/2024)   Depression (PHQ2-9)    PHQ-2  Score: 6  Alcohol Screen: Low Risk (03/28/2024)   Alcohol Screen    Last Alcohol Screening Score (AUDIT): 3  Housing: Low Risk (03/28/2024)   Epic    Unable to Pay for Housing in the Last Year: No    Number of Times Moved in the Last Year: 0    Homeless in the Last Year: No  Utilities: Not At Risk (06/03/2022)   [facility] Utilities    Threatened with loss of utilities: No  Health Literacy: Not on file    Constitutional: Patient reports intermittent headaches.  Denies fever, malaise, fatigue, or abrupt weight changes.  HEENT: Denies eye pain, eye redness, ear pain, ringing in the ears, wax buildup, runny nose, nasal congestion, bloody nose, or sore throat. Respiratory: Denies difficulty breathing, shortness of breath, cough or sputum production.   Cardiovascular: Denies chest pain, chest tightness, palpitations or swelling in the hands or feet.  Gastrointestinal: Denies abdominal pain, bloating, constipation, diarrhea or blood in the stool.  GU: Denies urgency, frequency, pain with urination, burning sensation, blood in urine, odor or discharge. Musculoskeletal: Patient reports chronic joint and muscle pain.  Denies decrease in range of motion, difficulty with gait, or joint swelling.  Skin: Pt reports cold sore. Denies redness, rashes.  Neurological: Patient reports insomnia.  Denies dizziness, difficulty with memory, difficulty with speech or problems with balance and coordination.  Psych: Patient has a history of anxiety and depression.  Denies SI/HI.   No other specific complaints in a complete review of systems (except as listed in HPI above).   Observations/Objective:   Wt Readings from Last 3 Encounters:  04/27/24 242 lb (109.8 kg)  04/06/24 243 lb (110.2 kg)  03/28/24 244 lb 6.4 oz (110.9 kg)    General: Appears her stated age,obese, in NAD. HEENT: Mouth: cold sore noted of right upper  lip. Pulmonary/Chest: Normal effort. No respiratory distress.  Neurological: Alert and  oriented.  Psychiatric: Mood and affect normal. Behavior is normal. Judgment and thought content normal.   BMET    Component Value Date/Time   NA 140 05/24/2024 1257   NA 142 09/18/2016 0929   K 3.8 05/24/2024 1257   CL 106 05/24/2024 1257   CO2 29 05/24/2024 1257   GLUCOSE 112 (H) 05/24/2024 1257   BUN 15 05/24/2024 1257   BUN 9 09/18/2016 0929   CREATININE 0.78 05/24/2024 1257   CALCIUM  9.6 05/24/2024 1257   GFRNONAA >60 01/15/2022 2334   GFRAA >60 12/01/2019 1558    Lipid Panel     Component Value Date/Time   CHOL 211 (H) 01/26/2024 1119   CHOL 200 (H) 03/23/2017 0844   TRIG 105 01/26/2024 1119   HDL 72 01/26/2024 1119   HDL 57 03/23/2017 0844   CHOLHDL 2.9 01/26/2024 1119   VLDL 36.2 03/19/2020 0918   LDLCALC 118 (H) 01/26/2024 1119    CBC    Component Value Date/Time   WBC 4.4 05/24/2024 1257   RBC 4.05 05/24/2024 1257   HGB 13.0 05/24/2024 1257   HGB 13.2 09/18/2016 0929   HCT 38.8 05/24/2024 1257   HCT 38.1 09/18/2016 0929   PLT 229 05/24/2024 1257   PLT 268 09/18/2016 0929   MCV 95.8 05/24/2024 1257   MCV 94 09/18/2016 0929   MCH 32.1 05/24/2024 1257   MCHC 33.5 05/24/2024 1257   RDW 12.6 05/24/2024 1257   RDW 12.8 09/18/2016 0929   LYMPHSABS 1,357 03/23/2023 1038   LYMPHSABS 2.8 08/03/2014 1609   MONOABS 0.6 08/30/2019 0315   EOSABS 70 05/24/2024 1257   EOSABS 0.1 08/03/2014 1609   BASOSABS 22 05/24/2024 1257   BASOSABS 0.0 08/03/2014 1609    Hgb A1C Lab Results  Component Value Date   HGBA1C 5.9 (H) 01/26/2024       Assessment and Plan:  Assessment and Plan      Cold sore Cold sore with decreased lesion size using Abreva. Increased susceptibility due to Imuran . - Continue Abreva as long as lesion decreases. - Prescribed oral valacyclovir  1 g BID x 3 days for future outbreaks.     RTC in 1 months for follow-up chronic conditions  Follow Up Instructions:    I discussed the assessment and treatment plan with the patient. The  patient was provided an opportunity to ask questions and all were answered. The patient agreed with the plan and demonstrated an understanding of the instructions.   The patient was advised to call back or seek an in-person evaluation if the symptoms worsen or if the condition fails to improve as anticipated.   Angeline Laura, NP

## 2024-06-21 NOTE — Patient Instructions (Signed)
 Cold Sore    A cold sore, also called a fever blister, is a small, fluid-filled sore that forms inside of the mouth or on the lips, gums, nose, chin, or cheeks. Cold sores can spread to other parts of the body, such as the eyes, fingers, or genitals.  Cold sores can spread from person to person (are contagious) until the sores crust over completely. Most cold sores go away within 2 weeks.  What are the causes?  Cold sores are caused by a virus (herpes simplex virus type 1, HSV-1). The virus can spread from person to person through close contact, such as through:  Kissing.  Touching the affected area.  Sharing personal items such as lip balm, razors, a drinking glass, or eating utensils.  What increases the risk?  Being tired, stressed, or sick.  Having your period (menstruating).  Being pregnant.  Taking certain medicines.  Being out in cold weather or getting too much sun.  What are the signs or symptoms?  Symptoms of a cold sore go through different stages:  Tingling, itching, or burning is felt 1-2 days before the cold sore appears.  Fluid-filled blisters appear on the lips, inside the mouth, on the nose, or on the cheeks.  The blisters start to ooze clear fluid.  The blisters dry up, and a yellow crust appears in their place.  The crust falls off.  In some cases, other symptoms can develop along with cold sores. These can include:  Fever.  Sore throat.  Headache.  Muscle aches.  Swollen neck glands.  How is this treated?  There is no cure for cold sores or the virus that causes them. There is also no vaccine to prevent the virus. Most cold sores go away on their own without treatment within 2 weeks. Your doctor may prescribe medicines to:  Help with pain.  Keep the virus from growing.  Help you heal faster.  Medicines may be in the form of creams, gels, pills, or a shot.  Follow these instructions at home:  Medicines  Take or apply over-the-counter and prescription medicines only as told by your doctor.  Use a  cotton-tip swab to apply creams or gels to your sores.  Ask your doctor if you can take lysine supplements. These may help with healing.  Sore care    Do not touch the sores or pick the scabs.  Wash your hands often with soap and water for at least 20 seconds. Do not touch your eyes without washing your hands first.  Keep the sores clean and dry.  If told, put ice on the sores. To do this:  Put ice in a plastic bag.  Place a towel between your skin and the bag.  Leave the ice on for 20 minutes, 2-3 times a day.  Take off the ice if your skin turns bright red. This is very important. If you cannot feel pain, heat, or cold, you have a greater risk of damage to the area.  Eating and drinking  Eat a soft, bland diet. Avoid eating hot, cold, or salty foods. These can hurt your mouth.  Use a straw if it hurts to drink out of a glass.  Eat foods that have a lot of lysine in them. These include meat, fish, and dairy products.  Avoid sugary foods, chocolates, nuts, and grains. These foods have a high amount of a substance (arginine) that can cause the virus to grow.  Lifestyle  Do not kiss, have oral sex,  or share personal items until your sores heal.  Stress, poor sleep, and being out in the sun can trigger a cold sore. Make sure you:  Do activities that help you relax, such as deep breathing exercises or meditation.  Get enough sleep.  Put sunscreen on your lips before you go out in the sun.  Contact a doctor if:  You have symptoms for more than 2 weeks.  You have pus coming from the sores.  You have redness that is spreading.  You have pain or irritation in your eye.  You get sores on your genitals.  Your sores do not heal within 2 weeks.  You get cold sores often.  Get help right away if:  You have a fever and your symptoms suddenly get worse.  You have a headache and confusion.  You have tiredness (fatigue).  You do not want to eat as much as normal (loss of appetite).  You have a stiff neck or are sensitive to  light.  Summary  A cold sore is a small, fluid-filled sore that forms inside of the mouth or on the lips, gums, nose, chin, or cheeks.  Cold sores can spread from person to person (are contagious) until the sores crust over completely. Most cold sores go away within 2 weeks.  Wash your hands often. Do not touch your eyes without washing your hands first.  Do not kiss, have oral sex, or share personal items until your sores heal.  Contact a doctor if your sores do not heal within 2 weeks.  This information is not intended to replace advice given to you by your health care provider. Make sure you discuss any questions you have with your health care provider.  Document Revised: 03/13/2021 Document Reviewed: 03/13/2021  Elsevier Patient Education  2024 [employer].

## 2024-06-28 ENCOUNTER — Encounter: Payer: Self-pay | Admitting: Internal Medicine

## 2024-06-28 MED ORDER — ESZOPICLONE 2 MG PO TABS: 2.0000 mg | ORAL_TABLET | Freq: Every evening | ORAL | 0 refills | Status: AC | PRN

## 2024-06-28 MED ORDER — TRAMADOL HCL 50 MG PO TABS: 50.0000 mg | ORAL_TABLET | Freq: Four times a day (QID) | ORAL | 0 refills | Status: AC | PRN

## 2024-06-29 ENCOUNTER — Ambulatory Visit: Attending: Physician Assistant | Admitting: Physician Assistant

## 2024-06-29 ENCOUNTER — Encounter: Payer: Self-pay | Admitting: Physician Assistant

## 2024-06-29 VITALS — BP 136/89 | HR 97 | Temp 98.1°F | Resp 14 | Ht 65.0 in | Wt 240.6 lb

## 2024-06-29 DIAGNOSIS — F419 Anxiety disorder, unspecified: Secondary | ICD-10-CM

## 2024-06-29 DIAGNOSIS — M249 Joint derangement, unspecified: Secondary | ICD-10-CM

## 2024-06-29 DIAGNOSIS — M5136 Other intervertebral disc degeneration, lumbar region with discogenic back pain only: Secondary | ICD-10-CM | POA: Diagnosis not present

## 2024-06-29 DIAGNOSIS — M3501 Sicca syndrome with keratoconjunctivitis: Secondary | ICD-10-CM

## 2024-06-29 DIAGNOSIS — M1380 Other specified arthritis, unspecified site: Secondary | ICD-10-CM | POA: Diagnosis not present

## 2024-06-29 DIAGNOSIS — Z79899 Other long term (current) drug therapy: Secondary | ICD-10-CM | POA: Diagnosis not present

## 2024-06-29 DIAGNOSIS — M19041 Primary osteoarthritis, right hand: Secondary | ICD-10-CM

## 2024-06-29 DIAGNOSIS — M797 Fibromyalgia: Secondary | ICD-10-CM

## 2024-06-29 DIAGNOSIS — R202 Paresthesia of skin: Secondary | ICD-10-CM

## 2024-06-29 DIAGNOSIS — F5102 Adjustment insomnia: Secondary | ICD-10-CM | POA: Diagnosis not present

## 2024-06-29 DIAGNOSIS — F32A Depression, unspecified: Secondary | ICD-10-CM

## 2024-06-29 DIAGNOSIS — M1711 Unilateral primary osteoarthritis, right knee: Secondary | ICD-10-CM

## 2024-06-29 DIAGNOSIS — M7061 Trochanteric bursitis, right hip: Secondary | ICD-10-CM

## 2024-06-29 DIAGNOSIS — R519 Headache, unspecified: Secondary | ICD-10-CM | POA: Diagnosis not present

## 2024-06-29 DIAGNOSIS — M19042 Primary osteoarthritis, left hand: Secondary | ICD-10-CM

## 2024-06-29 DIAGNOSIS — J302 Other seasonal allergic rhinitis: Secondary | ICD-10-CM

## 2024-06-29 DIAGNOSIS — Z8261 Family history of arthritis: Secondary | ICD-10-CM

## 2024-06-29 DIAGNOSIS — Z96652 Presence of left artificial knee joint: Secondary | ICD-10-CM | POA: Diagnosis not present

## 2024-06-29 DIAGNOSIS — E781 Pure hyperglyceridemia: Secondary | ICD-10-CM

## 2024-06-29 DIAGNOSIS — K219 Gastro-esophageal reflux disease without esophagitis: Secondary | ICD-10-CM

## 2024-06-29 DIAGNOSIS — M7062 Trochanteric bursitis, left hip: Secondary | ICD-10-CM

## 2024-06-29 DIAGNOSIS — Z8639 Personal history of other endocrine, nutritional and metabolic disease: Secondary | ICD-10-CM

## 2024-06-30 ENCOUNTER — Ambulatory Visit: Payer: Self-pay | Admitting: Physician Assistant

## 2024-06-30 NOTE — Progress Notes (Signed)
 ESR WNL Complements WNL Trace protein in urine. Please add urine protein creatinine ratio.

## 2024-07-03 NOTE — Progress Notes (Signed)
 SSA antibody remains positive, SSB antibody negative, ANA remains positive, SPEP normal, rheumatoid factor negative,  CRP normal, labs do not indicate an autoimmune disease flare.

## 2024-07-08 LAB — PROTEIN ELECTROPHORESIS, SERUM, WITH REFLEX
Albumin ELP: 4.2 g/dL (ref 3.8–4.8)
Alpha 1: 0.3 g/dL (ref 0.2–0.3)
Alpha 2: 0.7 g/dL (ref 0.5–0.9)
Beta 2: 0.5 g/dL (ref 0.2–0.5)
Beta Globulin: 0.4 g/dL (ref 0.4–0.6)
Gamma Globulin: 1.3 g/dL (ref 0.8–1.7)
Total Protein: 7.4 g/dL (ref 6.1–8.1)

## 2024-07-08 LAB — URINALYSIS, ROUTINE W REFLEX MICROSCOPIC
Bacteria, UA: NONE SEEN /HPF
Bilirubin Urine: NEGATIVE
Glucose, UA: NEGATIVE
Hgb urine dipstick: NEGATIVE
Hyaline Cast: NONE SEEN /LPF
Leukocytes,Ua: NEGATIVE
Nitrite: NEGATIVE
RBC / HPF: NONE SEEN /HPF (ref 0–2)
Specific Gravity, Urine: 1.029 (ref 1.001–1.035)
WBC, UA: NONE SEEN /HPF (ref 0–5)
pH: 5.5 (ref 5.0–8.0)

## 2024-07-08 LAB — ANTI-NUCLEAR AB-TITER (ANA TITER): ANA Titer 1: 1:1280 {titer} — ABNORMAL HIGH

## 2024-07-08 LAB — SJOGRENS SYNDROME-B EXTRACTABLE NUCLEAR ANTIBODY: SSB (La) (ENA) Antibody, IgG: <1 AI

## 2024-07-08 LAB — PROTEIN / CREATININE RATIO, URINE
Creatinine, Urine: 215 mg/dL (ref 20–275)
Protein/Creat Ratio: 79 mg/g{creat} (ref 24–184)
Protein/Creatinine Ratio: 0.079 mg/mg{creat} (ref 0.024–0.184)
Total Protein, Urine: 17 mg/dL (ref 5–24)

## 2024-07-08 LAB — C3 AND C4
C3 Complement: 160 mg/dL (ref 83–193)
C4 Complement: 53 mg/dL (ref 15–57)

## 2024-07-08 LAB — SEDIMENTATION RATE: Sed Rate: 19 mm/h (ref 0–30)

## 2024-07-08 LAB — RHEUMATOID FACTOR: Rheumatoid fact SerPl-aCnc: <10 [IU]/mL

## 2024-07-08 LAB — C-REACTIVE PROTEIN: CRP: 5.2 mg/L

## 2024-07-08 LAB — MICROSCOPIC MESSAGE

## 2024-07-08 LAB — ANA: Anti Nuclear Antibody (ANA): POSITIVE — AB

## 2024-07-08 LAB — SJOGRENS SYNDROME-A EXTRACTABLE NUCLEAR ANTIBODY: SSA (Ro) (ENA) Antibody, IgG: 4.5 AI — AB

## 2024-07-26 ENCOUNTER — Ambulatory Visit: Admitting: Internal Medicine

## 2024-11-29 ENCOUNTER — Ambulatory Visit: Admitting: Physician Assistant
